# Patient Record
Sex: Female | Born: 1980 | Race: White | Hispanic: No | Marital: Married
Health system: Midwestern US, Community
[De-identification: ages and names within clinical notes are randomized; demographics above are authoritative.]

## PROBLEM LIST (undated history)

## (undated) DIAGNOSIS — L03211 Cellulitis of face: Secondary | ICD-10-CM

## (undated) DIAGNOSIS — G43709 Chronic migraine without aura, not intractable, without status migrainosus: Principal | ICD-10-CM

## (undated) DIAGNOSIS — K621 Rectal polyp: Secondary | ICD-10-CM

## (undated) DIAGNOSIS — Z8673 Personal history of transient ischemic attack (TIA), and cerebral infarction without residual deficits: Principal | ICD-10-CM

## (undated) DIAGNOSIS — I639 Cerebral infarction, unspecified: Secondary | ICD-10-CM

## (undated) DIAGNOSIS — K449 Diaphragmatic hernia without obstruction or gangrene: Secondary | ICD-10-CM

## (undated) DIAGNOSIS — R112 Nausea with vomiting, unspecified: Principal | ICD-10-CM

## (undated) DIAGNOSIS — D120 Benign neoplasm of cecum: Secondary | ICD-10-CM

## (undated) DIAGNOSIS — M542 Cervicalgia: Secondary | ICD-10-CM

## (undated) DIAGNOSIS — R1013 Epigastric pain: Principal | ICD-10-CM

## (undated) DIAGNOSIS — A4902 Methicillin resistant Staphylococcus aureus infection, unspecified site: Secondary | ICD-10-CM

## (undated) DIAGNOSIS — J45909 Unspecified asthma, uncomplicated: Secondary | ICD-10-CM

## (undated) DIAGNOSIS — F419 Anxiety disorder, unspecified: Secondary | ICD-10-CM

## (undated) DIAGNOSIS — F909 Attention-deficit hyperactivity disorder, unspecified type: Secondary | ICD-10-CM

## (undated) DIAGNOSIS — R059 Cough, unspecified: Secondary | ICD-10-CM

## (undated) DIAGNOSIS — M79604 Pain in right leg: Secondary | ICD-10-CM

## (undated) DIAGNOSIS — M6281 Muscle weakness (generalized): Secondary | ICD-10-CM

## (undated) HISTORY — PX: KNEE SURGERY: SHX244

## (undated) HISTORY — PX: APPENDECTOMY: SHX54

## (undated) HISTORY — PX: OTHER SURGICAL HISTORY: SHX169

## (undated) HISTORY — PX: CHOLECYSTECTOMY: SHX55

---

## 2009-03-03 ENCOUNTER — Emergency Department (HOSPITAL_COMMUNITY): Admission: EM | Admit: 2009-03-03 | Discharge: 2009-03-03 | Payer: Self-pay | Admitting: Emergency Medicine

## 2009-06-06 ENCOUNTER — Inpatient Hospital Stay (HOSPITAL_COMMUNITY): Admission: AD | Admit: 2009-06-06 | Discharge: 2009-06-10 | Payer: Self-pay | Admitting: Otolaryngology

## 2010-04-20 LAB — POCT I-STAT, CHEM 8
Calcium, Ion: 1.04 mmol/L — ABNORMAL LOW (ref 1.12–1.32)
Chloride: 106 mEq/L (ref 96–112)
HCT: 51 % — ABNORMAL HIGH (ref 36.0–46.0)
Hemoglobin: 17.3 g/dL — ABNORMAL HIGH (ref 12.0–15.0)
TCO2: 25 mmol/L (ref 0–100)

## 2010-04-20 LAB — COMPREHENSIVE METABOLIC PANEL
ALT: 36 U/L — ABNORMAL HIGH (ref 0–35)
Alkaline Phosphatase: 64 U/L (ref 39–117)
BUN: 8 mg/dL (ref 6–23)
CO2: 25 mEq/L (ref 19–32)
Chloride: 103 mEq/L (ref 96–112)
GFR calc Af Amer: >60 mL/min (ref >60)
GFR calc non Af Amer: >60 mL/min (ref >60)
Glucose, Bld: 90 mg/dL (ref 70–99)
Potassium: 3.1 mEq/L — ABNORMAL LOW (ref 3.5–5.1)
Sodium: 139 mEq/L (ref 135–145)
Total Bilirubin: 0.7 mg/dL (ref 0.3–1.2)

## 2010-04-20 LAB — PHOSPHORUS: Phosphorus: 3.1 mg/dL (ref 2.3–4.6)

## 2010-04-20 LAB — D-DIMER, QUANTITATIVE: D-Dimer, Quant: 0.38 ug/mL-FEU (ref 0.00–0.48)

## 2010-04-21 LAB — CULTURE, ROUTINE-ABSCESS

## 2010-04-21 LAB — COMPREHENSIVE METABOLIC PANEL
ALT: 24 U/L (ref 0–35)
AST: 22 U/L (ref 0–37)
Alkaline Phosphatase: 54 U/L (ref 39–117)
CO2: 28 mEq/L (ref 19–32)
Chloride: 105 mEq/L (ref 96–112)
Creatinine, Ser: 0.99 mg/dL (ref 0.4–1.2)
Total Bilirubin: 0.8 mg/dL (ref 0.3–1.2)

## 2013-03-02 ENCOUNTER — Emergency Department (HOSPITAL_COMMUNITY): Payer: Self-pay

## 2013-03-02 ENCOUNTER — Emergency Department (HOSPITAL_COMMUNITY)
Admission: EM | Admit: 2013-03-02 | Discharge: 2013-03-02 | Disposition: A | Discharge status: Home / Self Care | Payer: Self-pay | Attending: Emergency Medicine | Admitting: Emergency Medicine

## 2013-03-02 ENCOUNTER — Encounter (HOSPITAL_COMMUNITY): Payer: Self-pay | Admitting: Emergency Medicine

## 2013-03-02 DIAGNOSIS — R519 Headache, unspecified: Secondary | ICD-10-CM

## 2013-03-02 DIAGNOSIS — R1011 Right upper quadrant pain: Secondary | ICD-10-CM | POA: Insufficient documentation

## 2013-03-02 DIAGNOSIS — J45909 Unspecified asthma, uncomplicated: Secondary | ICD-10-CM | POA: Insufficient documentation

## 2013-03-02 DIAGNOSIS — H538 Other visual disturbances: Secondary | ICD-10-CM | POA: Insufficient documentation

## 2013-03-02 DIAGNOSIS — R404 Transient alteration of awareness: Secondary | ICD-10-CM | POA: Insufficient documentation

## 2013-03-02 DIAGNOSIS — R1013 Epigastric pain: Secondary | ICD-10-CM | POA: Insufficient documentation

## 2013-03-02 DIAGNOSIS — Z8659 Personal history of other mental and behavioral disorders: Secondary | ICD-10-CM | POA: Insufficient documentation

## 2013-03-02 DIAGNOSIS — Z8614 Personal history of Methicillin resistant Staphylococcus aureus infection: Secondary | ICD-10-CM | POA: Insufficient documentation

## 2013-03-02 DIAGNOSIS — R112 Nausea with vomiting, unspecified: Secondary | ICD-10-CM | POA: Insufficient documentation

## 2013-03-02 DIAGNOSIS — R55 Syncope and collapse: Secondary | ICD-10-CM | POA: Insufficient documentation

## 2013-03-02 DIAGNOSIS — F17200 Nicotine dependence, unspecified, uncomplicated: Secondary | ICD-10-CM | POA: Insufficient documentation

## 2013-03-02 DIAGNOSIS — R1012 Left upper quadrant pain: Secondary | ICD-10-CM | POA: Insufficient documentation

## 2013-03-02 DIAGNOSIS — Z3202 Encounter for pregnancy test, result negative: Secondary | ICD-10-CM | POA: Insufficient documentation

## 2013-03-02 DIAGNOSIS — R51 Headache: Secondary | ICD-10-CM | POA: Insufficient documentation

## 2013-03-02 HISTORY — DX: Anxiety disorder, unspecified: F41.9

## 2013-03-02 HISTORY — DX: Methicillin resistant Staphylococcus aureus infection, unspecified site: A49.02

## 2013-03-02 HISTORY — DX: Unspecified asthma, uncomplicated: J45.909

## 2013-03-02 HISTORY — DX: Attention-deficit hyperactivity disorder, unspecified type: F90.9

## 2013-03-02 LAB — CBC WITH DIFFERENTIAL/PLATELET
Basophils Absolute: 0 10*3/uL (ref 0.0–0.1)
Basophils Relative: 0 % (ref 0–1)
EOS PCT: 3 % (ref 0–5)
Eosinophils Absolute: 0.3 10*3/uL (ref 0.0–0.7)
HCT: 44.7 % (ref 36.0–46.0)
Hemoglobin: 15.9 g/dL — ABNORMAL HIGH (ref 12.0–15.0)
LYMPHS ABS: 3.8 10*3/uL (ref 0.7–4.0)
LYMPHS PCT: 36 % (ref 12–46)
MCH: 34 pg (ref 26.0–34.0)
MCHC: 35.6 g/dL (ref 30.0–36.0)
MCV: 95.7 fL (ref 78.0–100.0)
Monocytes Absolute: 0.6 10*3/uL (ref 0.1–1.0)
Monocytes Relative: 6 % (ref 3–12)
Neutro Abs: 5.7 10*3/uL (ref 1.7–7.7)
Neutrophils Relative %: 54 % (ref 43–77)
PLATELETS: 275 10*3/uL (ref 150–400)
RBC: 4.67 MIL/uL (ref 3.87–5.11)
RDW: 12 % (ref 11.5–15.5)
WBC: 10.4 10*3/uL (ref 4.0–10.5)

## 2013-03-02 LAB — COMPREHENSIVE METABOLIC PANEL
ALBUMIN: 3.9 g/dL (ref 3.5–5.2)
ALK PHOS: 56 U/L (ref 39–117)
ALT: 12 U/L (ref 0–35)
AST: 14 U/L (ref 0–37)
BILIRUBIN TOTAL: 0.2 mg/dL — AB (ref 0.3–1.2)
BUN: 8 mg/dL (ref 6–23)
CO2: 29 mEq/L (ref 19–32)
CREATININE: 0.86 mg/dL (ref 0.50–1.10)
Calcium: 9.4 mg/dL (ref 8.4–10.5)
Chloride: 102 mEq/L (ref 96–112)
GFR calc non Af Amer: 88 mL/min — ABNORMAL LOW (ref >90)
GLUCOSE: 95 mg/dL (ref 70–99)
POTASSIUM: 3.5 meq/L — AB (ref 3.7–5.3)
Sodium: 143 mEq/L (ref 137–147)
TOTAL PROTEIN: 7.6 g/dL (ref 6.0–8.3)

## 2013-03-02 LAB — LIPASE, BLOOD: Lipase: 34 U/L (ref 11–59)

## 2013-03-02 LAB — URINALYSIS, ROUTINE W REFLEX MICROSCOPIC
Bilirubin Urine: NEGATIVE
GLUCOSE, UA: NEGATIVE mg/dL
LEUKOCYTES UA: NEGATIVE
Nitrite: NEGATIVE
PH: 7 (ref 5.0–8.0)
PROTEIN: NEGATIVE mg/dL
Specific Gravity, Urine: <1.005 — ABNORMAL LOW (ref 1.005–1.030)
Urobilinogen, UA: 0.2 mg/dL (ref 0.0–1.0)

## 2013-03-02 LAB — PREGNANCY, URINE: PREG TEST UR: NEGATIVE

## 2013-03-02 LAB — URINE MICROSCOPIC-ADD ON

## 2013-03-02 LAB — OCCULT BLOOD, POC DEVICE: Fecal Occult Bld: NEGATIVE

## 2013-03-02 LAB — TROPONIN I

## 2013-03-02 MED ORDER — FAMOTIDINE IN NACL 20-0.9 MG/50ML-% IV SOLN
20.0000 mg | Freq: Once | INTRAVENOUS | Status: AC
  Administered 2013-03-02: 20 mg via INTRAVENOUS
  Filled 2013-03-02: qty 50

## 2013-03-02 MED ORDER — PANTOPRAZOLE SODIUM 40 MG IV SOLR
40.0000 mg | Freq: Once | INTRAVENOUS | Status: AC
  Administered 2013-03-02: 40 mg via INTRAVENOUS
  Filled 2013-03-02: qty 40

## 2013-03-02 MED ORDER — IOHEXOL 300 MG/ML  SOLN
100.0000 mL | Freq: Once | INTRAMUSCULAR | Status: AC | PRN
  Administered 2013-03-02: 100 mL via INTRAVENOUS

## 2013-03-02 MED ORDER — IOHEXOL 300 MG/ML  SOLN
50.0000 mL | Freq: Once | INTRAMUSCULAR | Status: AC | PRN
  Administered 2013-03-02: 50 mL via ORAL

## 2013-03-02 MED ORDER — KETOROLAC TROMETHAMINE 30 MG/ML IJ SOLN
30.0000 mg | Freq: Once | INTRAMUSCULAR | Status: AC
  Administered 2013-03-02: 30 mg via INTRAVENOUS
  Filled 2013-03-02: qty 1

## 2013-03-02 MED ORDER — METOCLOPRAMIDE HCL 5 MG/ML IJ SOLN
10.0000 mg | Freq: Once | INTRAMUSCULAR | Status: AC
  Administered 2013-03-02: 10 mg via INTRAVENOUS
  Filled 2013-03-02: qty 2

## 2013-03-02 MED ORDER — ONDANSETRON HCL 4 MG/2ML IJ SOLN: 4.0000 mg | INTRAMUSCULAR | Status: DC | PRN

## 2013-03-02 MED ORDER — DIPHENHYDRAMINE HCL 50 MG/ML IJ SOLN
50.0000 mg | Freq: Once | INTRAMUSCULAR | Status: AC
  Administered 2013-03-02: 50 mg via INTRAVENOUS
  Filled 2013-03-02: qty 1

## 2013-03-02 MED ORDER — SODIUM CHLORIDE 0.9 % IV SOLN
INTRAVENOUS | Status: DC
  Administered 2013-03-02: 17:00:00 via INTRAVENOUS

## 2013-03-02 MED ORDER — PROMETHAZINE HCL 25 MG PO TABS: 25.0000 mg | ORAL_TABLET | Freq: Four times a day (QID) | ORAL | Status: AC | PRN

## 2013-03-02 NOTE — ED Notes (Addendum)
Patient reports syncopal episodes since July. Patient states that she has these "periods where she blackouts and wakes on the floor." Patient reports severe left side headache that causes her to have blurred vision. Per patient started vomiting after eating 4 months ago and vomiting has progressively gotten worse in past 2 months. Patient reports blood in emesis.

## 2013-03-02 NOTE — Discharge Instructions (Signed)
°Emergency Department Resource Guide °1) Find a Doctor and Pay Out of Pocket °Although you won't have to find out who is covered by your insurance plan, it is a good idea to ask around and get recommendations. You will then need to call the office and see if the doctor you have chosen will accept you as a new patient and what types of options they offer for patients who are self-pay. Some doctors offer discounts or will set up payment plans for their patients who do not have insurance, but you will need to ask so you aren't surprised when you get to your appointment. ° °2) Contact Your Local Health Department °Not all health departments have doctors that can see patients for sick visits, but many do, so it is worth a call to see if yours does. If you don't know where your local health department is, you can check in your phone book. The CDC also has a tool to help you locate your state's health department, and many state websites also have listings of all of their local health departments. ° °3) Find a Walk-in Clinic °If your illness is not likely to be very severe or complicated, you may want to try a walk in clinic. These are popping up all over the country in pharmacies, drugstores, and shopping centers. They're usually staffed by nurse practitioners or physician assistants that have been trained to treat common illnesses and complaints. They're usually fairly quick and inexpensive. However, if you have serious medical issues or chronic medical problems, these are probably not your best option. ° °No Primary Care Doctor: °- Call [facility] at  832-8000 - they can help you locate a primary care doctor that  accepts your insurance, provides certain services, etc. °- Physician Referral Service- 1-800-533-3463 ° °Chronic Pain Problems: °Organization         Address  Phone   Notes  °[facility]  (336) 297-2271 Patients need to be referred by their primary care doctor.  ° °Medication  Assistance: °Organization         Address  Phone   Notes  °[place] [facility] [address] °[place], [place] [address] (336) 641-8030 --Must be a resident of [place] °-- Must have NO insurance coverage whatsoever (no Medicaid/ Medicare, etc.) °-- The pt. MUST have a primary care doctor that directs their care regularly and follows them in the community °  °[facility]  (866) 331-1348   °[employer]  (888) 892-1162   ° °Agencies that provide inexpensive medical care: °Organization         Address  Phone   Notes  °[facility]  (336) 832-8035   °[facility]    (336) 832-7272   °[facility] [address] °[place], [place] [address] (336) 832-4777   °[facility] [address], °[place] (336) 271-4999   °[facility]    (336) 373-0678   °[facility]    (336) 272-1050   °[facility] ° [address], [place] Phone:  (336) 832-4444, Fax:  (336) 832-4440 Hours of Operation:  9 am - 6 pm, M-F.  Also accepts Medicaid/Medicare and self-pay.  °[facility] ° [address], [place] Phone: (336) 832-3150, Fax: (336) 832-3151. Hours of Operation:  8:30 am - 5:30 pm, M-F.  Also accepts Medicaid and self-pay.  °[facility] [place] [address], [place] Phone: (336) 878-6027   °[facility] [address], [place], [place] (336)723-1848, Ext. 123 Mondays & Thursdays: 7-9 AM.  First 15 patients are seen on a first come, first serve basis. °  ° °Medicaid-accepting [place] Providers: ° °Organization         Address  Phone   Notes  °[facility] [address], [place] (336) 641-2100 Also accepts self-pay patients.  °[facility] [address], [place] ° (336) 856-9996   °[facility] [address], [place]  (336) 288-8857   °[facility] [address] [place] [address], [place] (336) 299-7000   °Veita Bland [address], [place]  ° (336) 373-1557 Only accepts [place] Access Medicaid patients after they have their name applied to their card.  ° °Self-Pay (no insurance) in [place]: ° °Organization         Address  Phone   Notes  °Sickle Cell Patients, [facility] [address], [place] (336) 832-1970   °[facility] [address], [place] (336) 832-4400   °[facility] ° [address], [place] (336) 992-4800   °[facility]/Dr. Osei-Bonsu ° [address] [place] [address], [place] or [address], [place] (336) 841-8500 Phone number for both [place] and [place] locations is the same.  °[facility] [address], [place] (336) 299-0000   °[facility] [address] [place] [address], [place] or [address] (336) 852-7530 °(336) 878-2260   °[facility] [address], [place] (336) 350-1642, phone; (336) 294-5005, fax Sees patients 1st and 3rd Saturday of every month.  Must not qualify for public or private insurance (i.e. Medicaid, Medicare, [place] Health Choice, Veterans' Benefits) • Household income should be no more than 200% of the poverty level •The clinic cannot treat you if you are pregnant or think you are pregnant • Sexually transmitted diseases are not treated at the clinic.  ° ° °Dental Care: °Organization         Address  Phone  Notes  °[place] [facility] [address], [place] (336) 641-6152 Accepts children up to age 21 who are enrolled in Medicaid or [place] Health Choice; pregnant women with a Medicaid card; and children who have applied for Medicaid or [place] Health Choice, but were declined, whose parents can pay a reduced fee at time of service.  °[place]  [facility] [place]  [address], [place] (336) 641-7733 Accepts children up to age 21 who are enrolled in Medicaid or [place] Health Choice; pregnant women with a Medicaid card; and children who have applied for Medicaid or [place] Health Choice, but were declined, whose parents can pay a reduced fee at time of service.  °[facility] ° [address], [place] (336) 641-4533 Patients are seen by appointment only. Walk-ins are not accepted. [facility] will see patients 18 years of age and older. °Monday - Tuesday (8am-5pm) °Most Wednesdays (8:30-5pm) °$30 per visit, cash only  °[facility] ° [address], [place] (336) 641-4533 Patients are seen by appointment only. Walk-ins are not accepted. [facility] will see patients 18 years of age and older. °One   Wednesday Evening (Monthly: Volunteer Based).  $30 per visit, cash only  °[facility]  (919) 537-3737 for adults; Children under age 4, call Graduate Pediatric Dentistry at ([phone]) [phone]. Children aged 4-14, please call ([phone]) [phone] to request a pediatric application. ° Dental services are provided in all areas of dental care including fillings, crowns and bridges, complete and partial dentures, implants, gum treatment, root canals, and extractions. Preventive care is also provided. Treatment is provided to both adults and children. °Patients are selected via a lottery and there is often a waiting list. °  °[facility] [address], °[place] ° (336) 763-8833 [email or web address] °  °[facility] [address], [place], [place] (336)723-1848, Ext. 123 Second and Fourth Thursday of each month, opens at 6:30 AM; Clinic ends at 9 AM.  Patients are seen on a first-come first-served basis, and a limited number are seen during each clinic.  ° °[facility] ° [address], [place], [place] (336) 723-7904    Eligibility Requirements °You must have lived in [place], [place], or [place] counties for at least the last three months. °  You cannot be eligible for state or federal sponsored healthcare insurance, including Veterans Administration, Medicaid, or Medicare. °  You generally cannot be eligible for healthcare insurance through your employer.  °  How to apply: °Eligibility screenings are held every Tuesday and Wednesday afternoon from 1:00 pm until 4:00 pm. You do not need an appointment for the interview!  °[facility] [address], [place], [place] 336-631-2330   °[facility]  336-342-8273   °[facility]  336-703-3100   °[facility]  336-570-6415   ° °Behavioral Health Resources in the Community: °Intensive Outpatient Programs °Organization         Address  Phone  Notes  °[facility] [address], [place], [place] 336-878-6098   °[facility] [address], [place], [place] 336-832-9800   °[facility] [address], [place], [place] ° 336-882-2125   °[facility] [address],  °[place], [place] 1-800-853-5163 or 336-641-4981   °Substance Abuse Resources °Organization         Address  Phone  Notes  °[facility]  336-882-2125   °[facility]  336-784-9470   °[facility]  336-285-9073   °[facility]  336-845-3988   °Residential & Outpatient Substance Abuse Program  1-800-659-3381   °Psychological Services °Organization         Address  Phone  Notes  °[facility]  336- 832-9600   °[facility]  336- 378-7881   °[facility] [address], [place] 1-800-853-5163 or 336-641-4981   ° °Mobile Crisis Teams °Organization         Address  Phone  Notes  °[facility], Mobile Crisis Care Unit  1-877-626-1772   °Assertive °[facility] ° [address]  [place], [place] 336-834-9664   °Sharon DeEsch [address] °[place] 336-554-5454   ° °Self-Help/Support Groups °Organization         Address  Phone             Notes  °[facility]  - variety of support groups  336- 373-1402 Call for more information  °Narcotics Anonymous (NA), [facility] [address], °[place]  2 meetings at this location  ° °  Residential Treatment Programs Organization         Address  Phone  Notes  [facility] [address],    [place]  [phone]   [facility]  [address], [place] [phone], [place], [place] [phone]   [facility] [address], [place] [phone] Admissions: 8am-3pm M-F  [facility] [address]    [place], [place] [phone]   [facility] [address], [place], [place] [phone]   [facility] [address]  [place], [place] [phone]([phone]   [facility] Intensive Outpatient [address], [place], [place] [phone]   [facility] [address]  [place], [place] [phone] or [phone]   [facility] [address] [place], [place] [phone] Accepts Medicaid  [facility] [address]  [place] [phone] Substance Abuse/Addiction Treatment   [facility] [place] [facility] Resources Organization         Address  Phone  Notes  [facility]  (364)127-3325(888) [phone]   Angie FavaJulie Brannon, PhD [address], [address] [place], [place]   [phone](336) ([phone])[phone] or [phone](336) [phone]   [facility]   [address] [place], [place] [phone](336) [phone]   [facility] [address], [place], [place] ([phone]) [phone](336) [phone] Insurance/Medicaid/sponsorship through [facility]  [facility] [address]                                    [place], [place] ([phone]) [phone] Therapy/tele-psych/case    [facility] [address]   [place], [place] [phone](336) [phone]    Dr. Lolly MustacheArfeen  [phone](336) [phone]   [facility]  [employer] [facility] 1) [address], [place] 2) [address], [place] 3)  [address], [place] [phone](336) ([phone](336) ([phone]  [phone](336) [phone]   [facility] Child Abuse Hot[facility] 404-375-6238(336) 539-534-5808 or [phone](336) [phone] (After Hours)       Take over the counter tylenol, ibuprofen (OR excedrin) and benadryl, as directed on packaging, with the prescription given to you today, as needed for headache.  Keep a headache diary. Eat a bland diet, avoiding greasy, fatty, fried foods, as well as spicy and acidic foods or beverages.  Avoid eating within the hour or 2 before going to bed or laying down.  Also avoid teas, colas, coffee, chocolate, pepermint and spearment.  Take over the counter pepcid, one tablet by mouth twice a day, for the next 2 to 3 weeks.  May also take over the counter maalox/mylanta, as directed on packaging, as needed for discomfort.  Take the prescription as directed.  Call the GI doctor and the Neurologist on Monday to schedule a follow up appointment next week.  Return to the Emergency Department immediately if worsening.

## 2013-03-02 NOTE — ED Provider Notes (Signed)
CSN: [id number]     Arrival date & time 03/02/13  1621 History   First MD Initiated Contact with Patient 03/02/13 1623     Chief Complaint  Patient presents with  . Emesis  . Loss of Consciousness   HPI Pt was seen at 1630. Per pt, c/o gradual onset and persistence of constant multiple symptoms that have been occuring for the past 4 months, worse over the past 2 months. Pt states she "vomits up everything I eat" and has been seeing streaks of blood in her emesis for the past 4 months, has been associated with upper abd "pain."  Pt states she has had a constant left sided headache for the past 4 months. Pt describes the headache as "sharp," located on the left side of her head, and is associated with blurry vision. Pt states she intermittently also "gets shaking all over then wake up on the floor" for the past 4 months. States she is awake/alert during these "shaking episodes." After she "wakes up on the floor," she is not lethargic, is not confused, has not injured herself, and has not been incont of bowel or bladder.  Pt has not taken any OTC meds for same, nor been evaluated by any medical professional for these symptoms since their onset. Denies any new symptoms. Denies any changing symptoms. Denies CP/palpitations, no SOB/cough, no diarrhea, no black or blood in stools, no neck or back pain, no eyes pain, no visual changes, no facial droop, no slurred speech, no focal motor weakness, no tingling/numbness in extremities, no ataxia, no fevers, no rash. Denies headache was sudden or maximal in onset or at any time.      Past Medical History  Diagnosis Date  . ADHD (attention deficit hyperactivity disorder)   . MRSA infection   . Anxiety   . Asthma    Past Surgical History  Procedure Laterality Date  . Cholecystectomy    . Appendectomy    . Knee surgery Right   . Other surgical history      drains placed in face for MRSA   Family History  Problem Relation Age of Onset  . HIV Brother   .  Cancer Other    History  Substance Use Topics  . Smoking status: Current Every Day Smoker -- 1.00 packs/day for 8 years    Types: Cigarettes  . Smokeless tobacco: Never Used  . Alcohol Use: Yes     Comment: occasional   OB History   Grav Para Term Preterm Abortions TAB SAB Ect Mult Living   1    1  1    0     Review of Systems ROS: Statement: All systems negative except as marked or noted in the HPI; Constitutional: Negative for fever and chills. ; ; Eyes: Negative for eye pain, redness and discharge. ; ; ENMT: Negative for ear pain, hoarseness, nasal congestion, sinus pressure and sore throat. ; ; Cardiovascular: Negative for chest pain, palpitations, diaphoresis, dyspnea and peripheral edema. ; ; Respiratory: Negative for cough, wheezing and stridor. ; ; Gastrointestinal: +N/V, abd pain. Negative for diarrhea, blood in stool, jaundice and rectal bleeding. . ; ; Genitourinary: Negative for dysuria, flank pain and hematuria. ; ; Musculoskeletal: Negative for back pain and neck pain. Negative for swelling and trauma.; ; Skin: Negative for pruritus, rash, abrasions, blisters, bruising and skin lesion.; ; Neuro: +headache. Negative for lightheadedness and neck stiffness. Negative for weakness, +altered level of consciousness, involuntary movement. Negative for altered mental status, extremity weakness,  paresthesias.   Allergies  Review of patient's allergies indicates no known allergies.  Home Medications  No current outpatient prescriptions on file. BP 142/94  Pulse 118  Temp(Src) 98.2 F (36.8 C) (Oral)  Resp 18  Ht 5\' 1"  (1.549 m)  Wt 160 lb (72.576 kg)  BMI 30.25 kg/m2  SpO2 100%  LMP 02/28/2013 Physical Exam 1635: Physical examination:  Nursing notes reviewed; Vital signs and O2 SAT reviewed;  Constitutional: Well developed, Well nourished, Well hydrated, In no acute distress; Head:  Normocephalic, atraumatic; Eyes: EOMI, PERRL, No scleral icterus; ENMT: Mouth and pharynx normal,  Mucous membranes moist; Neck: Supple, Full range of motion, No lymphadenopathy; Cardiovascular: Regular rate and rhythm, No murmur, rub, or gallop; Respiratory: Breath sounds clear & equal bilaterally, No rales, rhonchi, wheezes.  Speaking full sentences with ease, Normal respiratory effort/excursion; Chest: Nontender, Movement normal; Abdomen: Soft, +RUQ, mid-epigastric, LUQ tenderness to palp. No rebound or guarding. Nondistended, Normal bowel sounds. Rectal exam performed w/permission of pt and ED RN chaperone present.  Anal tone normal.  Non-tender, soft brown stool in rectal vault, heme neg.  No fissures, no external hemorrhoids, no palp masses.; Genitourinary: No CVA tenderness; Extremities: Pulses normal, No tenderness, No edema, No calf edema or asymmetry.; Neuro: AA&Ox3, Major CN grossly intact. No facial droop. Speech clear. Grips equal. Strength 5/5 equal bilat UE's and LE's. No gross focal motor or sensory deficits in extremities. Climbs on and off stretcher easily by herself. Gait steady.; Skin: Color normal, Warm, Dry.; Psych:  Anxious.     ED Course  Procedures   EKG Interpretation    Date/Time:  Friday March 02 2013 16:46:58 EST Ventricular Rate:  76 PR Interval:  138 QRS Duration: 80 QT Interval:  400 QTC Calculation: 450 R Axis:   67 Text Interpretation:  Normal sinus rhythm with sinus arrhythmia Normal ECG When compared with ECG of 03/03/2009 No significant change was found Confirmed by [facility]  MD, Nicholos Johns [phone]) on 03/02/2013 4:58:39 PM            MDM  MDM Reviewed: previous chart, nursing note and vitals Reviewed previous: labs and ECG Interpretation: labs, x-ray, CT scan and ECG     Results for orders placed during the hospital encounter of 03/02/13  URINALYSIS, ROUTINE W REFLEX MICROSCOPIC      Result Value Range   Color, Urine YELLOW  YELLOW   APPearance HAZY (*) CLEAR   Specific Gravity, Urine <1.005 (*) 1.005 - 1.030   pH 7.0  5.0 - 8.0   Glucose,  UA NEGATIVE  NEGATIVE mg/dL   Hgb urine dipstick SMALL (*) NEGATIVE   Bilirubin Urine NEGATIVE  NEGATIVE   Ketones, ur TRACE (*) NEGATIVE mg/dL   Protein, ur NEGATIVE  NEGATIVE mg/dL   Urobilinogen, UA 0.2  0.0 - 1.0 mg/dL   Nitrite NEGATIVE  NEGATIVE   Leukocytes, UA NEGATIVE  NEGATIVE  PREGNANCY, URINE      Result Value Range   Preg Test, Ur NEGATIVE  NEGATIVE  CBC WITH DIFFERENTIAL      Result Value Range   WBC 10.4  4.0 - 10.5 K/uL   RBC 4.67  3.87 - 5.11 MIL/uL   Hemoglobin 15.9 (*) 12.0 - 15.0 g/dL   HCT 96.0  45.4 - 09.8 %   MCV 95.7  78.0 - 100.0 fL   MCH 34.0  26.0 - 34.0 pg   MCHC 35.6  30.0 - 36.0 g/dL   RDW 11.9  14.7 - 82.9 %  Platelets 275  150 - 400 K/uL   Neutrophils Relative % 54  43 - 77 %   Neutro Abs 5.7  1.7 - 7.7 K/uL   Lymphocytes Relative 36  12 - 46 %   Lymphs Abs 3.8  0.7 - 4.0 K/uL   Monocytes Relative 6  3 - 12 %   Monocytes Absolute 0.6  0.1 - 1.0 K/uL   Eosinophils Relative 3  0 - 5 %   Eosinophils Absolute 0.3  0.0 - 0.7 K/uL   Basophils Relative 0  0 - 1 %   Basophils Absolute 0.0  0.0 - 0.1 K/uL  COMPREHENSIVE METABOLIC PANEL      Result Value Range   Sodium 143  137 - 147 mEq/L   Potassium 3.5 (*) 3.7 - 5.3 mEq/L   Chloride 102  96 - 112 mEq/L   CO2 29  19 - 32 mEq/L   Glucose, Bld 95  70 - 99 mg/dL   BUN 8  6 - 23 mg/dL   Creatinine, Ser 4.540.86  0.50 - 1.10 mg/dL   Calcium 9.4  8.4 - 09.810.5 mg/dL   Total Protein 7.6  6.0 - 8.3 g/dL   Albumin 3.9  3.5 - 5.2 g/dL   AST 14  0 - 37 U/L   ALT 12  0 - 35 U/L   Alkaline Phosphatase 56  39 - 117 U/L   Total Bilirubin 0.2 (*) 0.3 - 1.2 mg/dL   GFR calc non Af Amer 88 (*) >90 mL/min   GFR calc Af Amer >90  >90 mL/min  LIPASE, BLOOD      Result Value Range   Lipase 34  11 - 59 U/L  TROPONIN I      Result Value Range   Troponin I <0.30  <0.30 ng/mL  URINE MICROSCOPIC-ADD ON      Result Value Range   Squamous Epithelial / LPF FEW (*) RARE   WBC, UA 0-2  <3 WBC/hpf   RBC / HPF 0-2  <3  RBC/hpf  OCCULT BLOOD, POC DEVICE      Result Value Range   Fecal Occult Bld NEGATIVE  NEGATIVE   Dg Chest 2 View 03/02/2013   CLINICAL DATA:  Abnormal labs.  Syncopal episode.  EXAM: CHEST  2 VIEW  COMPARISON:  03/03/2009  FINDINGS: The heart size and mediastinal contours are within normal limits. Both lungs are clear. The visualized skeletal structures are unremarkable.  IMPRESSION: No active cardiopulmonary disease.   Electronically Signed   By: Amie Portlandavid  Ormond M.D.   On: 03/02/2013 18:25   Ct Head Wo Contrast 03/02/2013   CLINICAL DATA:  Episodes of syncope since July, blurry vision and severe left headache currently  EXAM: CT HEAD WITHOUT CONTRAST  TECHNIQUE: Contiguous axial images were obtained from the base of the skull through the vertex without intravenous contrast.  COMPARISON:  None.  FINDINGS: No mass lesion. No midline shift. No acute hemorrhage or hematoma. No extra-axial fluid collections. No evidence of acute infarction. The calvarium is intact.  IMPRESSION: Negative   Electronically Signed   By: Esperanza Heiraymond  Rubner M.D.   On: 03/02/2013 18:15   Ct Abdomen Pelvis W Contrast 03/02/2013   CLINICAL DATA:  Postprandial vomiting began 4 months ago progressively worsening over past 2 months, hematemesis, history appendectomy, cholecystectomy, asthma  EXAM: CT ABDOMEN AND PELVIS WITH CONTRAST  TECHNIQUE: Multidetector CT imaging of the abdomen and pelvis was performed using the standard protocol following bolus administration of intravenous contrast.  Sagittal and coronal MPR images reconstructed from axial data set.  CONTRAST:  50mL OMNIPAQUE IOHEXOL 300 MG/ML SOLN orally, OMNIPAQUE IOHEXOL 300 MG/ML SOLN IV  COMPARISON:  None  FINDINGS: Minimal subsegmental atelectasis versus scarring left lower lobe.  Patient indicates history of cholecystectomy but a thin tubular structure is identified at the gallbladder fossa, question minimally dilated cystic duct remnant versus less likely a contracted  gallbladder.  Liver, spleen, pancreas, kidneys, and adrenal glands normal appearance.  Large and small bowel loops unremarkable.  Unremarkable bladder, uterus and adnexae.  Stomach appears mildly distended by contrast with pyloric wall upper normal thickness 6 mm diameter.  No mass, adenopathy, free fluid or inflammatory process.  No hernia or acute bone lesion.  IMPRESSION: No definite acute intra-abdominal or intrapelvic abnormalities.  Slight distention of the stomach with subjectively upper normal pyloric thickness.  Tubular structure at gallbladder fossa, question minimally distended cystic duct stump, less likely contracted gallbladder in this patient who indicates a past history of cholecystectomy.   Electronically Signed   By: Ulyses Southward M.D.   On: 03/02/2013 18:25    Medications  0.9 %  sodium chloride infusion ( Intravenous New Bag/Given 03/02/13 1705)  famotidine (PEPCID) IVPB 20 mg (0 mg Intravenous Stopped 03/02/13 1736)  pantoprazole (PROTONIX) injection 40 mg (40 mg Intravenous Given 03/02/13 1704)  ketorolac (TORADOL) 30 MG/ML injection 30 mg (30 mg Intravenous Given 03/02/13 1704)  metoCLOPramide (REGLAN) injection 10 mg (10 mg Intravenous Given 03/02/13 1704)  diphenhydrAMINE (BENADRYL) injection 50 mg (50 mg Intravenous Given 03/02/13 1704)     1915:  Pt has tol PO well while in the ED without N/V.  No stooling while in the ED.  Abd benign, VSS. Feels better after meds and wants to go home now. Not orthostatic on VS. Workup reassuring. Will continue to tx symptomatically at this time; f/u GI MD and Neuro MD. Dx and testing d/w pt and family.  Questions answered.  Verb understanding, agreeable to d/c home with outpt f/u.    Laray Anger, DO 03/05/13 Nicholos Johns

## 2013-03-02 NOTE — ED Notes (Signed)
Pt alert & oriented x4, stable gait. Patient given discharge instructions, paperwork & prescription(s). Patient  instructed to stop at the registration desk to finish any additional paperwork. Patient verbalized understanding. Pt left department w/ no further questions. 

## 2013-12-03 ENCOUNTER — Encounter (HOSPITAL_COMMUNITY): Payer: Self-pay | Admitting: Emergency Medicine

## 2015-04-24 IMAGING — CT CT ABD-PELV W/ CM
2 of 4 series · 16 of 46 positions shown, 18 images · IV contrast (Omnipaque 300)
Comparison: None

CLINICAL DATA: Postprandial vomiting began 4 months ago
progressively worsening over past 2 months, hematemesis, history
appendectomy, cholecystectomy, asthma

EXAM:
CT ABDOMEN AND PELVIS WITH CONTRAST
TECHNIQUE: Multidetector CT imaging of the abdomen and pelvis was performed
using the standard protocol following bolus administration of
intravenous contrast. Sagittal and coronal MPR images reconstructed
from axial data set.
CONTRAST:  50mL OMNIPAQUE IOHEXOL 300 MG/ML SOLN orally, 100mL
OMNIPAQUE IOHEXOL 300 MG/ML SOLN IV

[Series 2: abd_pel_with 5.0 b40f · axial · 0.67mm/px · z∈[-409,-29]mm · 13 of 84 slices shown, 15 images]
[im 4/84  soft-tissue]
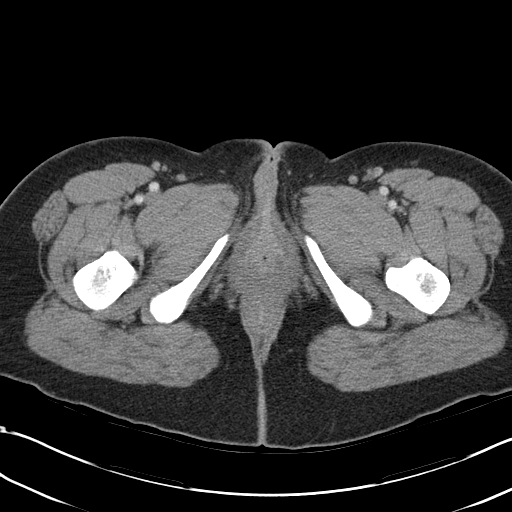
[im 4/84  bone]
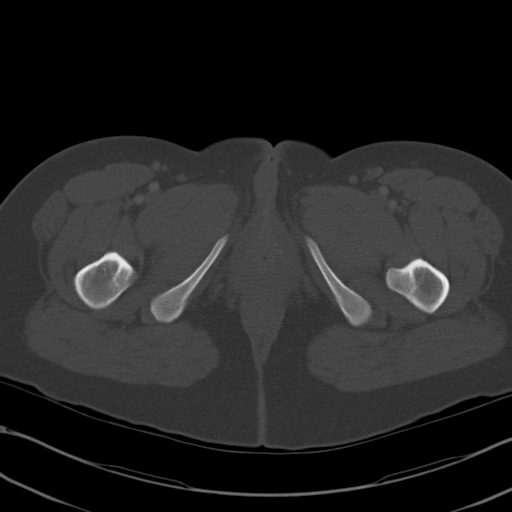
[im 11/84  soft-tissue]
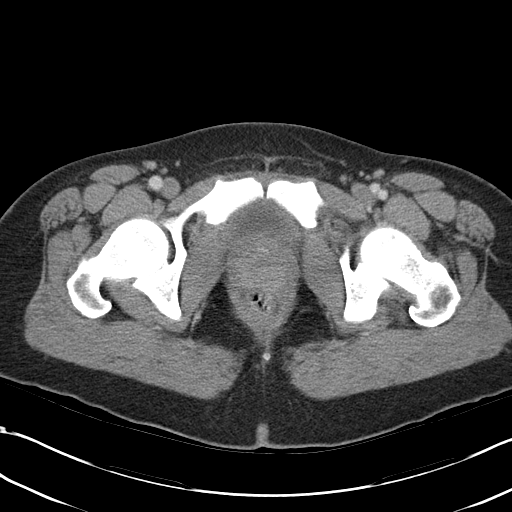
[im 18/84  soft-tissue]
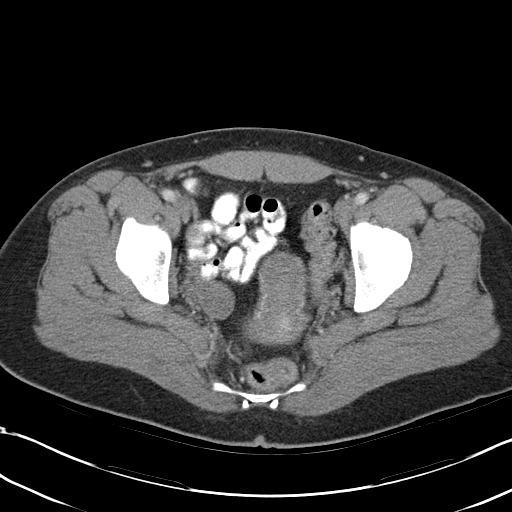
[im 25/84  soft-tissue]
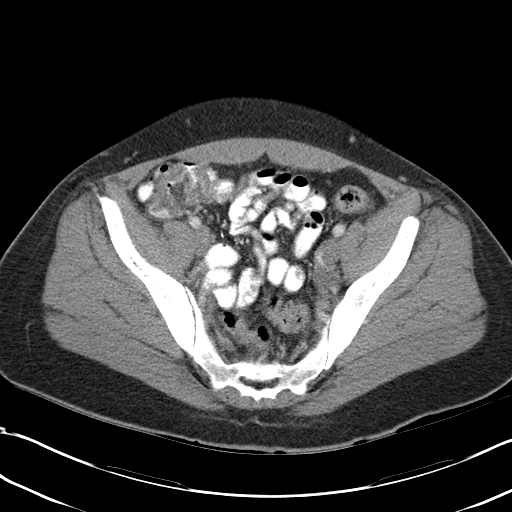
[im 28/84  soft-tissue]
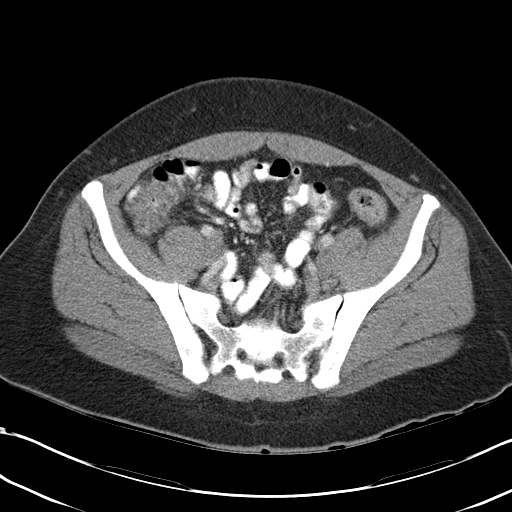
[im 35/84  soft-tissue]
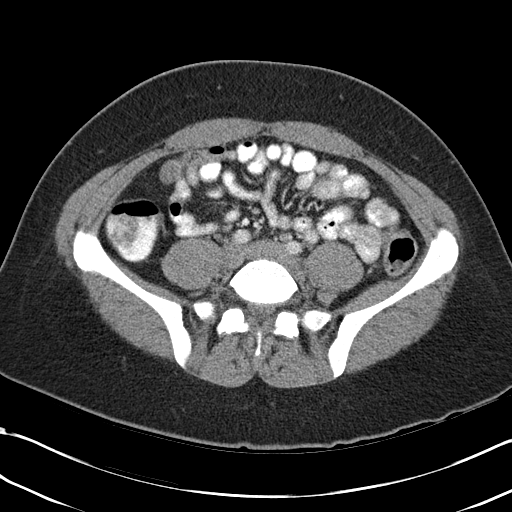
[im 42/84  soft-tissue]
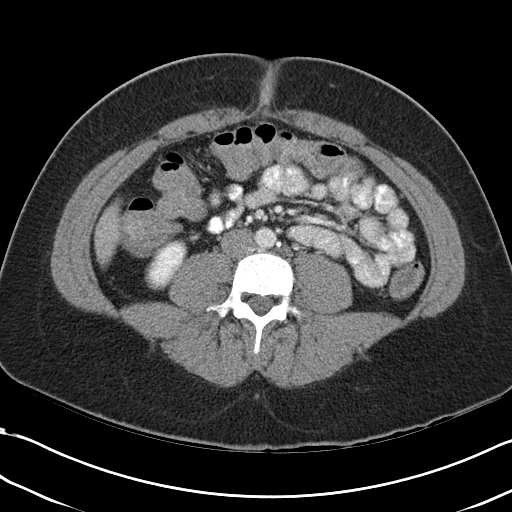
[im 49/84  soft-tissue]
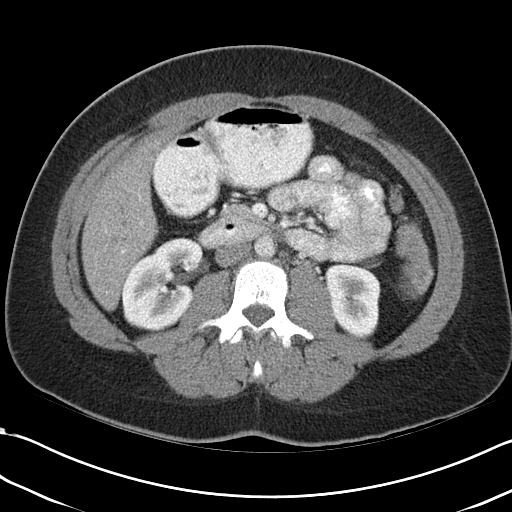
[im 56/84  soft-tissue]
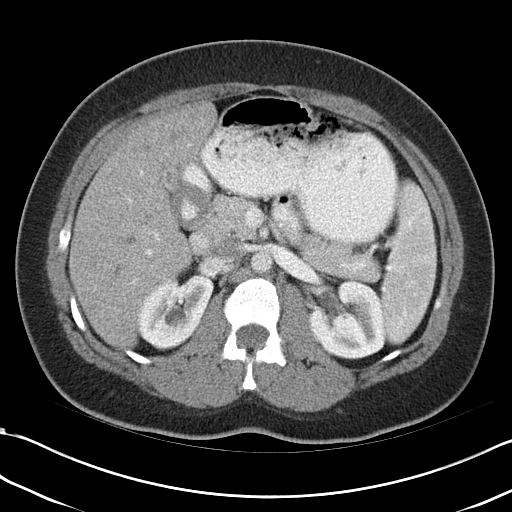
[im 56/84  bone]
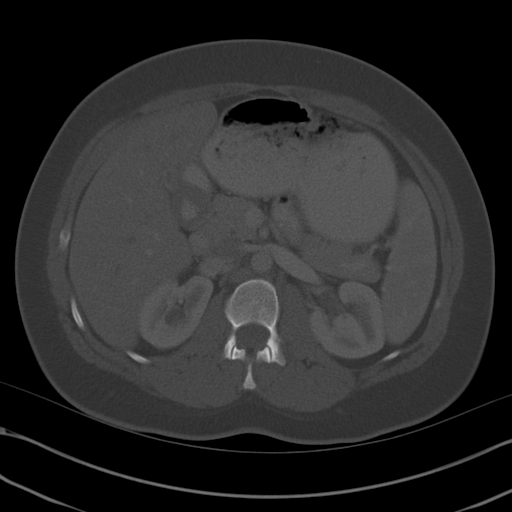
[im 59/84  soft-tissue]
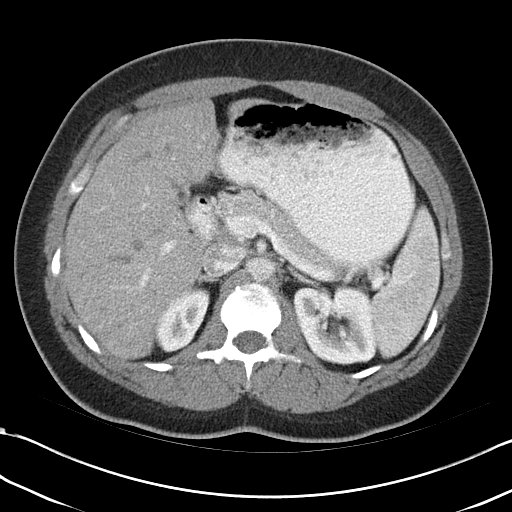
[im 66/84  soft-tissue]
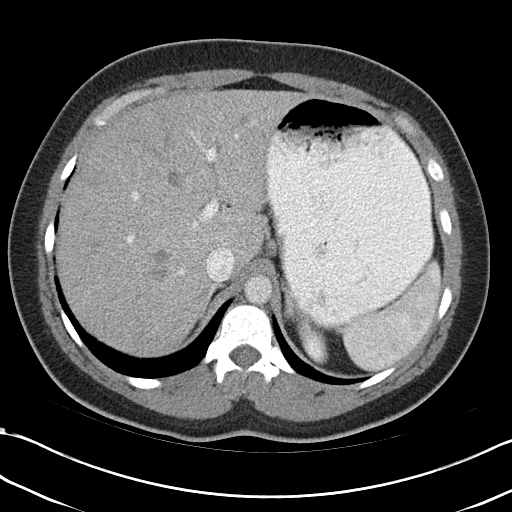
[im 73/84  soft-tissue]
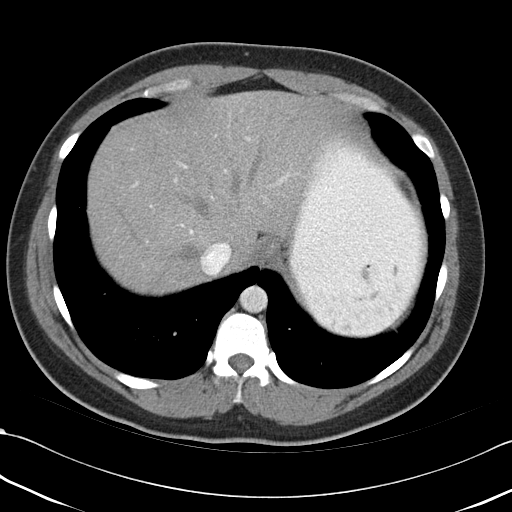
[im 80/84  soft-tissue]
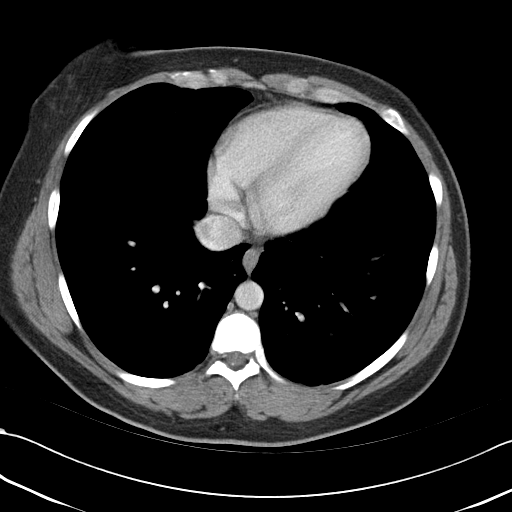

[Series 4: abd_pel_with 3.0 spo · coronal · 0.65mm/px · 3 of 80 slices shown]
[im 27/80  soft-tissue]
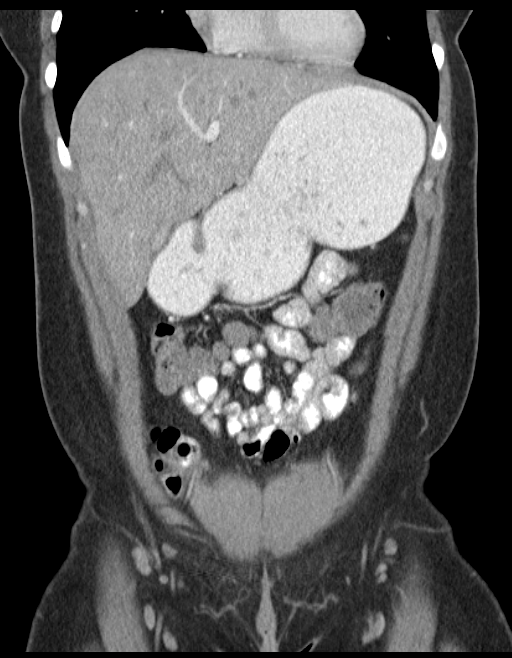
[im 36/80  soft-tissue]
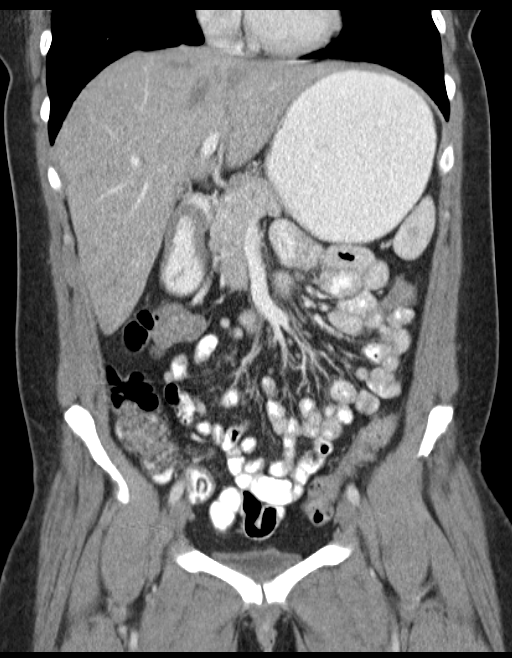
[im 44/80  soft-tissue]
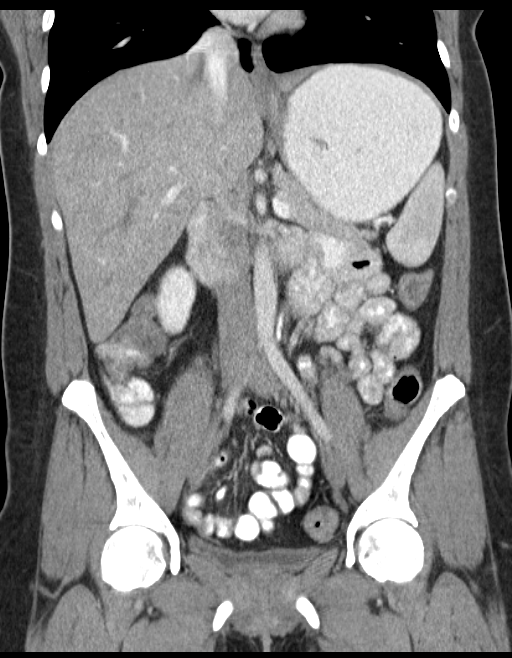

[16 of 46 positions shown; findings below may reference images not displayed]

FINDINGS: Minimal subsegmental atelectasis versus scarring left lower lobe.

Patient indicates history of cholecystectomy but a thin tubular
structure is identified at the gallbladder fossa, question minimally
dilated cystic duct remnant versus less likely a contracted
gallbladder.

Liver, spleen, pancreas, kidneys, and adrenal glands normal
appearance.

Large and small bowel loops unremarkable.

Unremarkable bladder, uterus and adnexae.

Stomach appears mildly distended by contrast with pyloric wall upper
normal thickness 6 mm diameter.

No mass, adenopathy, free fluid or inflammatory process.

No hernia or acute bone lesion.
IMPRESSION: No definite acute intra-abdominal or intrapelvic abnormalities.

Slight distention of the stomach with subjectively upper normal
pyloric thickness.

Tubular structure at gallbladder fossa, question minimally distended
cystic duct stump, less likely contracted gallbladder in this
patient who indicates a past history of cholecystectomy.

## 2017-02-09 ENCOUNTER — Inpatient Hospital Stay

## 2017-02-09 ENCOUNTER — Emergency Department: Admit: 2017-02-10 | Payer: BLUE CROSS/BLUE SHIELD | Primary: Primary Care

## 2017-02-09 ENCOUNTER — Emergency Department: Admit: 2017-02-09 | Payer: BLUE CROSS/BLUE SHIELD | Primary: Primary Care

## 2017-02-09 ENCOUNTER — Inpatient Hospital Stay
Admit: 2017-02-09 | Discharge: 2017-02-20 | Disposition: A | Discharge status: Other Acute Inpatient Hospital | Payer: BLUE CROSS/BLUE SHIELD | Attending: Internal Medicine | Admitting: Internal Medicine

## 2017-02-09 DIAGNOSIS — L0211 Cutaneous abscess of neck: Principal | ICD-10-CM

## 2017-02-09 LAB — CBC WITH AUTOMATED DIFF
ABS. BASOPHILS: 0.1 10*3/uL (ref 0.0–0.1)
ABS. EOSINOPHILS: 0.4 10*3/uL (ref 0.0–0.4)
ABS. IMM. GRANS.: 0.1 10*3/uL — ABNORMAL HIGH (ref 0.00–0.04)
ABS. LYMPHOCYTES: 3.9 10*3/uL — ABNORMAL HIGH (ref 0.8–3.5)
ABS. MONOCYTES: 0.8 10*3/uL (ref 0.0–1.0)
ABS. NEUTROPHILS: 6.8 10*3/uL (ref 1.8–8.0)
ABSOLUTE NRBC: 0 10*3/uL (ref 0.00–0.01)
BASOPHILS: 0 % (ref 0–1)
EOSINOPHILS: 4 % (ref 0–7)
HCT: 42.5 % (ref 35.0–47.0)
HGB: 14.4 g/dL (ref 11.5–16.0)
IMMATURE GRANULOCYTES: 1 % — ABNORMAL HIGH (ref 0.0–0.5)
LYMPHOCYTES: 33 % (ref 12–49)
MCH: 33.3 PG (ref 26.0–34.0)
MCHC: 33.9 g/dL (ref 30.0–36.5)
MCV: 98.4 FL (ref 80.0–99.0)
MONOCYTES: 7 % (ref 5–13)
MPV: 10.4 FL (ref 8.9–12.9)
NEUTROPHILS: 56 % (ref 32–75)
NRBC: 0 PER 100 WBC
PLATELET: 281 10*3/uL (ref 150–400)
RBC: 4.32 M/uL (ref 3.80–5.20)
RDW: 11.9 % (ref 11.5–14.5)
WBC: 12.1 10*3/uL — ABNORMAL HIGH (ref 3.6–11.0)

## 2017-02-09 LAB — METABOLIC PANEL, COMPREHENSIVE
A-G Ratio: 0.8 — ABNORMAL LOW (ref 1.1–2.2)
ALT (SGPT): 20 U/L (ref 12–78)
AST (SGOT): 36 U/L (ref 15–37)
Albumin: 3.5 g/dL (ref 3.5–5.0)
Alk. phosphatase: 66 U/L (ref 45–117)
Anion gap: 7 mmol/L (ref 5–15)
BUN/Creatinine ratio: 13 (ref 12–20)
BUN: 12 MG/DL (ref 6–20)
Bilirubin, total: 0.5 MG/DL (ref 0.2–1.0)
CO2: 28 mmol/L (ref 21–32)
Calcium: 8.8 MG/DL (ref 8.5–10.1)
Chloride: 104 mmol/L (ref 97–108)
Creatinine: 0.91 MG/DL (ref 0.55–1.02)
GFR est AA: >60 mL/min/{1.73_m2} (ref >60)
GFR est non-AA: >60 mL/min/{1.73_m2} (ref >60)
Globulin: 4.2 g/dL — ABNORMAL HIGH (ref 2.0–4.0)
Glucose: 62 mg/dL — ABNORMAL LOW (ref 65–100)
Potassium: 5 mmol/L (ref 3.5–5.1)
Protein, total: 7.7 g/dL (ref 6.4–8.2)
Sodium: 139 mmol/L (ref 136–145)

## 2017-02-09 LAB — SAMPLES BEING HELD

## 2017-02-09 LAB — LACTIC ACID: Lactic acid: 1.8 MMOL/L (ref 0.4–2.0)

## 2017-02-09 MED ORDER — MORPHINE 2 MG/ML INJECTION
2 mg/mL | INTRAMUSCULAR | Status: AC
Start: 2017-02-09 — End: 2017-02-09
  Administered 2017-02-09: 23:00:00 via INTRAVENOUS

## 2017-02-09 MED ORDER — SODIUM CHLORIDE 0.9% BOLUS IV
0.9 % | Freq: Once | INTRAVENOUS | Status: AC
Start: 2017-02-09 — End: 2017-02-09
  Administered 2017-02-09: 22:00:00 via INTRAVENOUS

## 2017-02-09 MED ORDER — SODIUM CHLORIDE 0.9 % IJ SYRG
Freq: Once | INTRAMUSCULAR | Status: AC
Start: 2017-02-09 — End: 2017-02-09
  Administered 2017-02-09: 22:00:00 via INTRAVENOUS

## 2017-02-09 MED ORDER — IOPAMIDOL 76 % IV SOLN
370 mg iodine /mL (76 %) | Freq: Once | INTRAVENOUS | Status: AC
Start: 2017-02-09 — End: 2017-02-09
  Administered 2017-02-09: 22:00:00 via INTRAVENOUS

## 2017-02-09 MED FILL — MORPHINE 2 MG/ML INJECTION: 2 mg/mL | INTRAMUSCULAR | Qty: 1

## 2017-02-09 MED FILL — ISOVUE-370  76 % INTRAVENOUS SOLUTION: 370 mg iodine /mL (76 %) | INTRAVENOUS | Qty: 100

## 2017-02-09 MED FILL — SODIUM CHLORIDE 0.9 % IV: INTRAVENOUS | Qty: 100

## 2017-02-09 MED FILL — NORMAL SALINE FLUSH 0.9 % INJECTION SYRINGE: INTRAMUSCULAR | Qty: 10

## 2017-02-09 NOTE — Other (Signed)
TRANSFER - OUT REPORT:    Verbal report given to Liu(name) on Christina Stevens  being transferred to 5S(unit) for routine progression of care       Report consisted of patient???s Situation, Background, Assessment and   Recommendations(SBAR).     Information from the following report(s) ED Summary and Recent Results was reviewed with the receiving nurse.    Lines:   Peripheral IV 02/09/17 Right Arm (Active)   Site Assessment Clean, dry, & intact 02/09/2017  3:50 PM   Phlebitis Assessment 0 02/09/2017  3:50 PM   Infiltration Assessment 0 02/09/2017  3:50 PM   Dressing Status Clean, dry, & intact 02/09/2017  3:50 PM   Dressing Type Transparent 02/09/2017  3:50 PM   Hub Color/Line Status Pink;Capped;Flushed;Patent 02/09/2017  3:50 PM   Action Taken Blood drawn 02/09/2017  3:50 PM        Opportunity for questions and clarification was provided.      Patient transported with:   [employer]

## 2017-02-09 NOTE — ED Triage Notes (Signed)
Patient states she was seen by her doctor 2 weeks ago for intermittent fevers and was diagnosed with mono and given prednisone and amoxicillin. Patient states 2 days ago she started with left sided swelling of her arms and legs, feeling like her skin was tight. Today patient states she has an abscess under her shin and she has been having increased swelling which is causing her to have difficulty breathing and swallowing. Patient states she cut herself shaving several days ago and was treated with 10 days of antibiotics. Patient is also c/o left sided abdominal pain, pain on palpation.

## 2017-02-09 NOTE — ED Notes (Signed)
PROGRESS NOTE:  7:26 PM  Christina Stevens, Benjie Karvonenharles D, MD reviewed CT w/ Dr. Belva ChimesSzucs. There is no sign of abscess, deep neck infection, or Ludwig angina. Pt has cellulitis involving bilat face w/ some submental swelling, no definite abscess by CT. Recent mono w/ continuing fever after amoxicillin. Will admit to hospitalist and give IV Ancef.     CONSULT NOTE:  7:52 PM Fanny Agan, Benjie Karvonenharles D, MD Tiger Texted Dr. Christie BeckersMathur, Consult for Hospitalist.  Discussed available diagnostic tests and clinical findings.  Dr. Christie BeckersMathur will evaluate Pt for admission.

## 2017-02-09 NOTE — ED Notes (Signed)
Pt to CT.

## 2017-02-09 NOTE — Progress Notes (Signed)
Admission Medication Reconciliation:    Information obtained from: patient    Significant PMH/Disease States:   Past Medical History:   Diagnosis Date   ??? ADHD    ??? PCOS (polycystic ovarian syndrome)        Chief Complaint for this Admission:  sore throat    Allergies:  Patient has no known allergies.    Prior to Admission Medications:   Prior to Admission Medications   Prescriptions Last Dose Informant Patient Reported? Taking?   dextroamphetamine-amphetamine (ADDERALL) 20 mg tablet 02/08/2017 at Unknown time  Yes Yes   Sig: Take 40 mg by mouth daily.      Facility-Administered Medications: None     Comments/Recommendations: medication review done with patient  Added Adderal.  Last dose 02/08/17  No known medication allergies

## 2017-02-09 NOTE — Progress Notes (Signed)
TRANSFER - IN REPORT:    Verbal report received from jillian RN(name) on Christina Stevens  being received from ED(unit) for routine progression of care      Report consisted of patient???s Situation, Background, Assessment and   Recommendations(SBAR).     Information from the following report(s) ED Summary was reviewed with the receiving nurse.    Opportunity for questions and clarification was provided.      Assessment completed upon patient???s arrival to unit and care assumed.

## 2017-02-09 NOTE — ED Notes (Signed)
Pt aware of need for urine sample.

## 2017-02-09 NOTE — ED Provider Notes (Signed)
PIT Note: Christina Stevens is a 37 y.o. female who presents ambulatory to the ED with  Multiple complaints. Patient states she was seen by her doctor 2 weeks ago for intermittent fevers and was diagnosed with mono and given prednisone and amoxicillin. Patient states 2 days ago she started with left sided swelling of her arms and legs, feeling like her skin was tight. Today patient states she has an abscess under her shin and she has been having increased swelling which is causing her to have difficulty breathing and swallowing. Patient states she cut herself shaving several days ago and was treated with 10 days of antibiotics (Amoxicillin) Patient is also c/o left sided abdominal pain, pain on palpation. Patient was seen at [facility] as well as [facility] in the past month. Presents here with worsening symptoms.     PCP: Ancil Boozer, NP    PMHx significant for: No past medical history on file.    PSHx significant for: No past surgical history on file.    There are no further complaints or symptoms at this time.   [name], Magnus Sinning, NP, 3:08 PM        37 y.o. female with past medical history significant for PCOS and ADHD who presents from home with chief complaint of SOB. Pt reports she "hasn't felt like (her)self" for 1 month d/t fatigue and intermittent fever. She states she was evaluated at an ED 3-4 weeks ago d/t fever and was dx w/ a "viral illness" at that time. Pt was evaluated by her PCP 2.5 weeks ago where she tested positive for mono; she was placed on a 10-day course of amoxicillin "as a precaution" at that time. Pt states she cut her chin ~1 week ago while shaving, and the wound had remained unchanged until she finished the amoxicillin 4-5 days ago. Since then, her chin has been progressively swelling, she c/o sore throat w/ neck stiffness and SOB, she is unable to fully open her mouth, and her L sided face is swollen and red. Pt states she "broke out with small white pockets" over her chin this  morning, and she c/o a new painful "protrusion" over her L lateral ribs. There are no other acute medical concerns at this time.    PCP: Ancil Boozer, NP    Note written by Janece Canterbury, Scribe, as dictated by Christella Noa, MD 5:07 PM             No past medical history on file.    No past surgical history on file.      No family history on file.    Social History     Socioeconomic History   ??? Marital status: MARRIED     Spouse name: Not on file   ??? Number of children: Not on file   ??? Years of education: Not on file   ??? Highest education level: Not on file   Social Needs   ??? Financial resource strain: Not on file   ??? Food insecurity - worry: Not on file   ??? Food insecurity - inability: Not on file   ??? Transportation needs - medical: Not on file   ??? Transportation needs - non-medical: Not on file   Occupational History   ??? Not on file   Tobacco Use   ??? Smoking status: Not on file   Substance and Sexual Activity   ??? Alcohol use: Not on file   ??? Drug use: Not on file   ???  Sexual activity: Not on file   Other Topics Concern   ??? Not on file   Social History Narrative   ??? Not on file         ALLERGIES: Patient has no allergy information on record.    Review of Systems   Constitutional: Positive for fatigue and fever. Negative for appetite change, chills and diaphoresis.   HENT: Positive for facial swelling and sore throat. Negative for congestion, ear discharge, ear pain, sinus pressure, sinus pain and trouble swallowing.    Eyes: Negative for photophobia, pain, redness and visual disturbance.   Respiratory: Positive for shortness of breath. Negative for chest tightness and wheezing.    Cardiovascular: Negative for chest pain and palpitations.   Gastrointestinal: Negative for abdominal distention, abdominal pain, nausea and vomiting.   Endocrine: Negative.    Genitourinary: Negative for difficulty urinating, flank pain, frequency and urgency.    Musculoskeletal: Positive for myalgias and neck stiffness. Negative for back pain and neck pain.   Skin: Positive for color change and wound. Negative for pallor and rash.   Allergic/Immunologic: Negative.    Neurological: Negative for dizziness, speech difficulty, weakness and headaches.   Hematological: Does not bruise/bleed easily.   Psychiatric/Behavioral: Negative for behavioral problems. The patient is not nervous/anxious.    All other systems reviewed and are negative.      Vitals:    02/09/17 1433   Pulse: 99   SpO2: 99%            Physical Exam   Constitutional: She is oriented to person, place, and time. She appears well-developed and well-nourished. No distress.   HENT:   Head: Normocephalic and atraumatic.   Eyes: Conjunctivae are normal. No scleral icterus.   Neck: Neck supple. No tracheal deviation present.   Cardiovascular: Normal rate, regular rhythm, normal heart sounds and intact distal pulses. Exam reveals no gallop and no friction rub.   No murmur heard.  Pulmonary/Chest: Effort normal and breath sounds normal. She has no wheezes. She has no rales.   Abdominal: Soft. She exhibits no distension. There is no tenderness. There is no rebound and no guarding.   Musculoskeletal: She exhibits tenderness. She exhibits no edema.   Slight tenderness over L lateral ribs.    Neurological: She is alert and oriented to person, place, and time.   Skin: Skin is warm and dry. No rash noted. There is erythema (over neck).   Psychiatric: She has a normal mood and affect.   Nursing note and vitals reviewed.  Note written by Janece CanterburyJay F Spangler, Scribe, as dictated by Christella NoaMagnant, Charles D, MD 5:07 PM    MDM         Procedures

## 2017-02-09 NOTE — ED Notes (Signed)
Pt declines pregnancy test.

## 2017-02-10 LAB — CBC WITH AUTOMATED DIFF
ABS. BASOPHILS: 0.1 10*3/uL (ref 0.0–0.1)
ABS. EOSINOPHILS: 0.5 10*3/uL — ABNORMAL HIGH (ref 0.0–0.4)
ABS. IMM. GRANS.: 0 10*3/uL (ref 0.00–0.04)
ABS. LYMPHOCYTES: 5.6 10*3/uL — ABNORMAL HIGH (ref 0.8–3.5)
ABS. MONOCYTES: 1.1 10*3/uL — ABNORMAL HIGH (ref 0.0–1.0)
ABS. NEUTROPHILS: 8.1 10*3/uL — ABNORMAL HIGH (ref 1.8–8.0)
ABSOLUTE NRBC: 0 10*3/uL (ref 0.00–0.01)
BASOPHILS: 0 % (ref 0–1)
EOSINOPHILS: 3 % (ref 0–7)
HCT: 38.3 % (ref 35.0–47.0)
HGB: 12.9 g/dL (ref 11.5–16.0)
IMMATURE GRANULOCYTES: 0 % (ref 0.0–0.5)
LYMPHOCYTES: 37 % (ref 12–49)
MCH: 33.3 PG (ref 26.0–34.0)
MCHC: 33.7 g/dL (ref 30.0–36.5)
MCV: 99 FL (ref 80.0–99.0)
MONOCYTES: 7 % (ref 5–13)
MPV: 10.2 FL (ref 8.9–12.9)
NEUTROPHILS: 53 % (ref 32–75)
NRBC: 0 PER 100 WBC
PLATELET: 261 10*3/uL (ref 150–400)
RBC: 3.87 M/uL (ref 3.80–5.20)
RDW: 11.9 % (ref 11.5–14.5)
WBC: 15.4 10*3/uL — ABNORMAL HIGH (ref 3.6–11.0)

## 2017-02-10 LAB — METABOLIC PANEL, COMPREHENSIVE
A-G Ratio: 0.9 — ABNORMAL LOW (ref 1.1–2.2)
ALT (SGPT): 16 U/L (ref 12–78)
AST (SGOT): 12 U/L — ABNORMAL LOW (ref 15–37)
Albumin: 3 g/dL — ABNORMAL LOW (ref 3.5–5.0)
Alk. phosphatase: 55 U/L (ref 45–117)
Anion gap: 7 mmol/L (ref 5–15)
BUN/Creatinine ratio: 11 — ABNORMAL LOW (ref 12–20)
BUN: 10 MG/DL (ref 6–20)
Bilirubin, total: 0.3 MG/DL (ref 0.2–1.0)
CO2: 27 mmol/L (ref 21–32)
Calcium: 8.1 MG/DL — ABNORMAL LOW (ref 8.5–10.1)
Chloride: 104 mmol/L (ref 97–108)
Creatinine: 0.89 MG/DL (ref 0.55–1.02)
GFR est AA: >60 mL/min/{1.73_m2} (ref >60)
GFR est non-AA: >60 mL/min/{1.73_m2} (ref >60)
Globulin: 3.4 g/dL (ref 2.0–4.0)
Glucose: 101 mg/dL — ABNORMAL HIGH (ref 65–100)
Potassium: 3.9 mmol/L (ref 3.5–5.1)
Protein, total: 6.4 g/dL (ref 6.4–8.2)
Sodium: 138 mmol/L (ref 136–145)

## 2017-02-10 LAB — URINALYSIS W/MICROSCOPIC
Bacteria: NEGATIVE /hpf
Bilirubin: NEGATIVE
Blood: NEGATIVE
Glucose: NEGATIVE mg/dL
Ketone: NEGATIVE mg/dL
Leukocyte Esterase: NEGATIVE
Nitrites: NEGATIVE
Protein: NEGATIVE mg/dL
Specific gravity: 1.01 (ref 1.003–1.030)
Urobilinogen: 0.2 EU/dL (ref 0.2–1.0)
pH (UA): 6 (ref 5.0–8.0)

## 2017-02-10 LAB — URINE CULTURE HOLD SAMPLE

## 2017-02-10 MED ORDER — MORPHINE 2 MG/ML INJECTION
2 mg/mL | Freq: Once | INTRAMUSCULAR | Status: AC
Start: 2017-02-10 — End: 2017-02-09
  Administered 2017-02-10: 01:00:00 via INTRAVENOUS

## 2017-02-10 MED ORDER — ADV ADDAPTOR
3.375 gram | Freq: Three times a day (TID) | Status: DC
Start: 2017-02-10 — End: 2017-02-20
  Administered 2017-02-10 – 2017-02-20 (×32): via INTRAVENOUS

## 2017-02-10 MED ORDER — SODIUM CHLORIDE 0.9 % IJ SYRG
Freq: Three times a day (TID) | INTRAMUSCULAR | Status: DC
Start: 2017-02-10 — End: 2017-02-20
  Administered 2017-02-10 – 2017-02-20 (×32): via INTRAVENOUS

## 2017-02-10 MED ORDER — SODIUM CHLORIDE 0.9 % IV PIGGY BACK
3.375 gram | Freq: Once | INTRAVENOUS | Status: DC
Start: 2017-02-10 — End: 2017-02-09

## 2017-02-10 MED ORDER — KETOROLAC TROMETHAMINE 30 MG/ML INJECTION
30 mg/mL (1 mL) | Freq: Three times a day (TID) | INTRAMUSCULAR | Status: AC | PRN
Start: 2017-02-10 — End: 2017-02-11
  Administered 2017-02-10 – 2017-02-11 (×3): via INTRAVENOUS

## 2017-02-10 MED ORDER — DEXAMETHASONE SODIUM PHOSPHATE 4 MG/ML IJ SOLN
4 mg/mL | Freq: Three times a day (TID) | INTRAMUSCULAR | Status: DC
Start: 2017-02-10 — End: 2017-02-10

## 2017-02-10 MED ORDER — PHARMACY VANCOMYCIN NOTE
Freq: Once | Status: AC
Start: 2017-02-10 — End: 2017-02-11
  Administered 2017-02-11: 17:00:00

## 2017-02-10 MED ORDER — SODIUM CHLORIDE 0.9 % IV
INTRAVENOUS | Status: DC
Start: 2017-02-10 — End: 2017-02-15
  Administered 2017-02-10 – 2017-02-14 (×4): via INTRAVENOUS

## 2017-02-10 MED ORDER — HYDROMORPHONE 2 MG/ML INJECTION SOLUTION
2 mg/mL | INTRAMUSCULAR | Status: DC | PRN
Start: 2017-02-10 — End: 2017-02-10
  Administered 2017-02-10 (×2): via INTRAVENOUS

## 2017-02-10 MED ORDER — ACETAMINOPHEN (TYLENOL) SOLUTION 32MG/ML
Freq: Four times a day (QID) | ORAL | Status: DC
Start: 2017-02-10 — End: 2017-02-15
  Administered 2017-02-10 – 2017-02-15 (×18): via ORAL

## 2017-02-10 MED ORDER — IBUPROFEN 200 MG TAB
200 mg | Freq: Four times a day (QID) | ORAL | Status: DC | PRN
Start: 2017-02-10 — End: 2017-02-10

## 2017-02-10 MED ORDER — SODIUM CHLORIDE 0.9 % IV PIGGY BACK
1 gram | INTRAVENOUS | Status: AC
Start: 2017-02-10 — End: 2017-02-09
  Administered 2017-02-10: 01:00:00 via INTRAVENOUS

## 2017-02-10 MED ORDER — VANCOMYCIN IN 0.9% SODIUM CHLORIDE 1.5 G/500 ML IV
1.5 g/500 mL | Freq: Once | INTRAVENOUS | Status: AC
Start: 2017-02-10 — End: 2017-02-10
  Administered 2017-02-10: 05:00:00 via INTRAVENOUS

## 2017-02-10 MED ORDER — PHARMACY VANCOMYCIN NOTE
Status: DC
Start: 2017-02-10 — End: 2017-02-20

## 2017-02-10 MED ORDER — TRAMADOL 50 MG TAB
50 mg | Freq: Two times a day (BID) | ORAL | Status: DC | PRN
Start: 2017-02-10 — End: 2017-02-11
  Administered 2017-02-10 – 2017-02-11 (×2): via ORAL

## 2017-02-10 MED ORDER — CEFTRIAXONE 1 GRAM SOLUTION FOR INJECTION
1 gram | INTRAMUSCULAR | Status: DC
Start: 2017-02-10 — End: 2017-02-09
  Administered 2017-02-10: 01:00:00 via INTRAVENOUS

## 2017-02-10 MED ORDER — VIAL2BAG ADAPTOR (20 MM)
1000 mg | Freq: Two times a day (BID) | Status: DC
Start: 2017-02-10 — End: 2017-02-11
  Administered 2017-02-10 – 2017-02-11 (×3): via INTRAVENOUS

## 2017-02-10 MED ORDER — SODIUM CHLORIDE 0.9 % IV
4 mg/mL | Freq: Three times a day (TID) | INTRAVENOUS | Status: DC
Start: 2017-02-10 — End: 2017-02-10

## 2017-02-10 MED ORDER — SODIUM CHLORIDE 0.9 % IJ SYRG
INTRAMUSCULAR | Status: DC | PRN
Start: 2017-02-10 — End: 2017-02-20
  Administered 2017-02-19: 08:00:00 via INTRAVENOUS

## 2017-02-10 MED ORDER — ENOXAPARIN 40 MG/0.4 ML SUB-Q SYRINGE
40 mg/0.4 mL | SUBCUTANEOUS | Status: DC
Start: 2017-02-10 — End: 2017-02-20
  Administered 2017-02-10 – 2017-02-20 (×11): via SUBCUTANEOUS

## 2017-02-10 MED ORDER — HYDROMORPHONE 2 MG/ML INJECTION SOLUTION
2 mg/mL | INTRAMUSCULAR | Status: DC | PRN
Start: 2017-02-10 — End: 2017-02-10
  Administered 2017-02-10: 05:00:00 via INTRAVENOUS

## 2017-02-10 MED FILL — ENOXAPARIN 40 MG/0.4 ML SUB-Q SYRINGE: 40 mg/0.4 mL | SUBCUTANEOUS | Qty: 0.4

## 2017-02-10 MED FILL — VANCOMYCIN 1,000 MG IV SOLR: 1000 mg | INTRAVENOUS | Qty: 1000

## 2017-02-10 MED FILL — PIPERACILLIN-TAZOBACTAM 3.375 GRAM IV SOLR: 3.375 gram | INTRAVENOUS | Qty: 3.38

## 2017-02-10 MED FILL — MORPHINE 2 MG/ML INJECTION: 2 mg/mL | INTRAMUSCULAR | Qty: 2

## 2017-02-10 MED FILL — NORMAL SALINE FLUSH 0.9 % INJECTION SYRINGE: INTRAMUSCULAR | Qty: 10

## 2017-02-10 MED FILL — HYDROMORPHONE 2 MG/ML INJECTION SOLUTION: 2 mg/mL | INTRAMUSCULAR | Qty: 1

## 2017-02-10 MED FILL — KETOROLAC TROMETHAMINE 30 MG/ML INJECTION: 30 mg/mL (1 mL) | INTRAMUSCULAR | Qty: 1

## 2017-02-10 MED FILL — TRAMADOL 50 MG TAB: 50 mg | ORAL | Qty: 1

## 2017-02-10 MED FILL — CEFAZOLIN 1 GRAM SOLUTION FOR INJECTION: 1 gram | INTRAMUSCULAR | Qty: 1000

## 2017-02-10 MED FILL — MORPHINE 4 MG/ML SYRINGE: 4 mg/mL | INTRAMUSCULAR | Qty: 1

## 2017-02-10 MED FILL — ACETAMINOPHEN 650 MG/20.3 ML ORAL SOLN: 650 mg/20.3 mL | ORAL | Qty: 20.3

## 2017-02-10 MED FILL — SODIUM CHLORIDE 0.9 % IV: INTRAVENOUS | Qty: 1000

## 2017-02-10 MED FILL — PHARMACY VANCOMYCIN NOTE: Qty: 1

## 2017-02-10 MED FILL — VANCOMYCIN IN 0.9% SODIUM CHLORIDE 1.5 G/500 ML IV: 1.5 g/500 mL | INTRAVENOUS | Qty: 500

## 2017-02-10 MED FILL — MONOJECT PREFILL ADVANCED 0.9 % SODIUM CHLORIDE INJECTION SYRINGE: INTRAMUSCULAR | Qty: 40

## 2017-02-10 NOTE — Progress Notes (Signed)
Primary Nurse Guilan Liu, RN and Devon Jones, RN performed a dual skin assessment on this patient No impairment noted  Braden score is 23

## 2017-02-10 NOTE — Progress Notes (Signed)
Bedside shift change report given to Christy (oncoming nurse) by Kelly G (offgoing nurse). Report included the following information SBAR, Kardex and Intake/Output.

## 2017-02-10 NOTE — Progress Notes (Signed)
Pharmacist Note - Vancomycin Dosing  Consult provided for this 37 y.o. female for indication of cellulitis.  Antibiotic regimen(s): Zosyn and Vancomycin    Recent Labs     02/09/17  1536   WBC 12.1*   CREA 0.91   BUN 12     Frequency of BMP: daily  Height: 154.9 cm  Weight: 72.6 kg  Est CrCl: 77 ml/min  Temp (24hrs), Avg:97.7 ??F (36.5 ??C), Min:97.7 ??F (36.5 ??C), Max:97.8 ??F (36.6 ??C)    Cultures: blood      Goal trough = 10 - 15 mcg/mL    Therapy will be initiated with a loading dose of 1500 mg IV x 1 to be followed by a maintenance dose of 1000 mg IV every 12 hours.  Pharmacy to follow patient daily and order levels / make dose adjustments as appropriate.

## 2017-02-10 NOTE — Progress Notes (Signed)
Bedside shift change report given to Kelly RN (oncoming nurse) by Holly RN (offgoing nurse). Report included the following information SBAR, Kardex, ED Summary, Intake/Output, MAR and Recent Results.

## 2017-02-10 NOTE — Progress Notes (Signed)
Nurse Guilan Liu gave bedside report to oncoming nurse Holly Longest RN by SBAR and Kardex

## 2017-02-10 NOTE — Progress Notes (Signed)
Hospitalist Progress Note  [name], MD  Answering service: [phone] OR [phone] from in house phone        Date of Service:  02/10/2017  NAME:  Christina Stevens  DOB:  03-17-1980  MRN:  [id number]      Admission Summary:   37 y.o F with PMH of ADHD,PCOS was admitted due to facial cellulitis.    Interval history / Subjective:     Patient states that she was diagnosed with mononucleosis at her PCP office and was prescribed amoxicillin and steroids few weeks ago.Later she had cut herself while shaving and she continued to use amoxicillin for nearly 14 days. She also mentioned about going to [facility] ER.  Again,she cut herself again while shaving and since then she noticed swelling,difficult to open her mouth and breathing issues.    She still continues to have pain while opening mouth and complaints of pain radiating to her ears       Assessment & Plan:     #Facial cellulitis:  -CT neck did not show any abscess but USG showed soft tissue swelling  -on IV vanc and zosyn  -Discussed with Dr.kerr- ENT over phone -recommended to start IV decadron for swelling and analgesia.    Morbid obesity:  Body mass index is 30.24 kg/m??.  -life style changes and weight loss    Code status:Full  DVT prophylaxis: SCD    Care Plan discussed with: Patient/Family  Disposition: TBD     Hospital Problems  Date Reviewed: Mar 02, 2017          Codes Class Noted POA    Facial cellulitis ICD-10-CM: L03.211  ICD-9-CM: 682.0  2017/03/02 Unknown                Review of Systems:   Pertinent items are noted in HPI.       Vital Signs:    Last 24hrs VS reviewed since prior progress note. Most recent are:  Visit Vitals  BP 121/63 (BP 1 Location: Left arm, BP Patient Position: At rest)   Pulse 72   Temp 98.2 ??F (36.8 ??C)   Resp 16   Ht 5\' 1"  (1.549 m)   Wt 72.6 kg (160 lb 0.9 oz)   SpO2 98%   BMI 30.24 kg/m??         Intake/Output Summary (Last 24 hours) at 02/10/2017 2253   Last data filed at 02/10/2017 1400  Gross per 24 hour   Intake 500 ml   Output ???   Net 500 ml        Physical Examination:             Constitutional:  No acute distress, cooperative, pleasant??   ENT:  Oral mucous moist, no dental caries, erythema and swelling below th chin.  Difficulty in opening mouth completely   Resp:  CTA bilaterally. No wheezing/rhonchi/rales. No accessory muscle use   CV:  Regular rhythm, normal rate, no murmurs, gallops, rubs    GI:  Soft, non distended, non tender. normoactive bowel sounds, no hepatosplenomegaly     Musculoskeletal:  No edema, warm, 2+ pulses throughout    Neurologic:  Moves all extremities.  AAOx3, CN II-XII reviewed            Data Review:    Review and/or order of clinical lab test  Review and/or order of tests in the radiology section of CPT  Review and/or order of tests in the medicine section of CPT  Labs:     Recent Labs     02/10/17  0204 02/09/17  1536   WBC 15.4* 12.1*   HGB 12.9 14.4   HCT 38.3 42.5   PLT 261 281     Recent Labs     02/10/17  0204 02/09/17  1536   NA 138 139   K 3.9 5.0   CL 104 104   CO2 27 28   BUN 10 12   CREA 0.89 0.91   GLU 101* 62*   CA 8.1* 8.8     Recent Labs     02/10/17  0204 02/09/17  1536   SGOT 12* 36   ALT 16 20   AP 55 66   TBILI 0.3 0.5   TP 6.4 7.7   ALB 3.0* 3.5   GLOB 3.4 4.2*     No results for input(s): INR, PTP, APTT in the last 72 hours.    No lab exists for component: INREXT   No results for input(s): FE, TIBC, PSAT, FERR in the last 72 hours.   No results found for: FOL, RBCF   No results for input(s): PH, PCO2, PO2 in the last 72 hours.  No results for input(s): CPK, CKNDX, TROIQ in the last 72 hours.    No lab exists for component: CPKMB  No results found for: CHOL, CHOLX, CHLST, CHOLV, HDL, LDL, LDLC, DLDLP, TGLX, TRIGL, TRIGP, CHHD, CHHDX  No results found for: [facility]  Lab Results   Component Value Date/Time    Color YELLOW/STRAW 02/09/2017 07:34 PM    Appearance CLEAR 02/09/2017 07:34 PM     Specific gravity 1.010 02/09/2017 07:34 PM    pH (UA) 6.0 02/09/2017 07:34 PM    Protein NEGATIVE  02/09/2017 07:34 PM    Glucose NEGATIVE  02/09/2017 07:34 PM    Ketone NEGATIVE  02/09/2017 07:34 PM    Bilirubin NEGATIVE  02/09/2017 07:34 PM    Urobilinogen 0.2 02/09/2017 07:34 PM    Nitrites NEGATIVE  02/09/2017 07:34 PM    Leukocyte Esterase NEGATIVE  02/09/2017 07:34 PM    Epithelial cells MODERATE (A) 02/09/2017 07:34 PM    Bacteria NEGATIVE  02/09/2017 07:34 PM    WBC 0-4 02/09/2017 07:34 PM    RBC 0-5 02/09/2017 07:34 PM         Medications Reviewed:     Current Facility-Administered Medications   Medication Dose Route Frequency   ??? Vancomycin Pharmacy dosing   Other Rx Dosing/Monitoring   ??? vancomycin (VANCOCIN) 1,000 mg in 0.9% sodium chloride 250 mL (Vial2Bag)  1,000 mg IntraVENous Q12H   ??? acetaminophen (TYLENOL) solution 500 mg  500 mg Oral Q6H   ??? ketorolac (TORADOL) injection 15 mg  15 mg IntraVENous Q8H PRN   ??? traMADol (ULTRAM) tablet 25 mg  25 mg Oral Q12H PRN   ??? [START ON 02/11/2017] Vancomycin, Trough - Please draw IMMEDIATELY PRIOR to starting 1200 dose Friday 1/11.   Other ONCE   ??? sodium chloride (NS) flush 5-40 mL  5-40 mL IntraVENous Q8H   ??? sodium chloride (NS) flush 5-40 mL  5-40 mL IntraVENous PRN   ??? enoxaparin (LOVENOX) injection 40 mg  40 mg SubCUTAneous Q24H   ??? 0.9% sodium chloride infusion  75 mL/hr IntraVENous CONTINUOUS   ??? piperacillin-tazobactam (ZOSYN) 3.375 g in 0.9% sodium chloride (MBP/ADV) 100 mL  3.375 g IntraVENous Q8H     ______________________________________________________________________  EXPECTED LENGTH OF STAY: 3d 7h  ACTUAL LENGTH OF STAY:  [place], MD

## 2017-02-10 NOTE — H&P (Signed)
[facility]  HISTORY AND PHYSICAL      Name:Kenner, TERSEA  MR#: [id number]  DOB: Jan 07, 1981  ACCOUNT #: [id number]   ADMIT DATE: 02/09/2017    CHIEF COMPLAINT:  Facial swelling and discomfort for the last 3-4 days, progressively getting worse this morning.    HISTORY OF PRESENT ILLNESS:  Patient is a 37 year old female with a known history of PCOS and ADHD.  As per the patient, about a couple of weeks back, patient was evaluated by her primary care doctor and was found to have some neck swelling, was diagnosed to have mononucleosis, was put on prednisone and amoxicillin.  As per the patient, the symptoms had temporarily resolved.  About 3 days earlier, while shaving, had nicked around the chin area on the right side and then afterwards had noted multiple pustules in the submental and around the chin area.  Several hours later had started noticing the areas were getting more swollen and tender to touch, which has progressively gotten worse.  The patient has been having difficulty swallowing, has a lot of pain.  Does not complain of any shortness of breath, chest tightness or wheezing.  The patient also complains of some left-sided flank pain, which radiates from the left CVA angle to the front.  Has not had any vomiting or diarrhea associated with it.  Has been having off and on chills and night sweats in the last 4 or 5 days.  The patient also complains of choking feeling in the throat.    PAST MEDICAL HISTORY:  History of ADHD and PCOS.    ALLERGIES:  THE PATIENT HAS NO KNOWN ALLERGIES.    MEDICATIONS:  Adderall.    FAMILY HISTORY:  Mother healthy.  Father healthy.    PAST SURGICAL HISTORY:  Status post percutaneous cholecystectomy.  The patient is status post appendectomy.    SOCIAL HISTORY:  10 pack years of smoking.  Denies alcohol abuse.    REVIEW OF SYSTEMS:  CONSTITUTIONAL:  Positive fatigue.  Positive chills.  Positive night sweats.  EYES:  Negative visual problem.  Negative eye pain.   HEENT:  Negative sore throat.  Positive odynophagia.  Positive facial swelling.  PULMONARY:  Positive shortness of breath.  Patient choking feeling.  Negative wheezing.  Negative pleuritic chest pains.  CARDIOVASCULAR:  Negative chest pains.  Negative dyspnea on exertion.  GASTROINTESTINAL:  Negative abdominal pain. Negative vomiting.  Negative diarrhea.  GENITOURINARY:  Positive frequency of urination.    PHYSICAL EXAMINATION:  VITAL SIGNS:  Blood pressure 115/72, pulse rate of 87, respiration of 14 and temperature of 97.8.  HEAD, EYES, EARS, NOSE, THROAT:  Pupils are equal, react to light.  No pallor.  No icterus.  On the face, on the chin and mental area, a lot of scab tissue was noted.  Submental swelling was noted.  Positive pustules are noted.  Local region lymphadenopathy was noted.  Right side of the face was also mildly swollen.  NECK:  Supple.  LUNGS:  Clear.  HEART:  S1, S2 audible.  No S3, S4.  ABDOMEN:  Soft, nontender.  No guarding, no rigidity, no rebound.  EXTREMITIES:  There is no edema.    LABORATORY DATA:  Showed WBC of 12.1, hemoglobin of 14.4, platelets of 201.  UA was normal.  On the CMP showed sodium 139, potassium 5.0, glucose of 62, BUN of 12, creatinine of 0.91.  Lactic acid was 1.8.  Liver enzymes were normal.    ASSESSMENT:  The patient  is a 37 year old female with a history of attention deficit hyperactivity disorder and polycystic ovarian syndrome who presents with facial cellulitis.  A CT scan of the head and neck and ultrasound of the neck and soft tissue negative for any abscess.    PLAN:  Will admit, start IV antibiotics on the patient with vancomycin and Zosyn.  Will get Ear, Nose and hroat (HEENT) consult in the morning.  We will monitor closely for any hypoxia or respiratory issues.  CODE STATUS:  FULL CODE.  DVT prophylaxis:  Lovenox was given.      Darrold JunkerFAIZUR Kebra Lowrimore, MD       FR/LN  D: 02/09/2017 22:47     T: 02/10/2017 02:43  JOB #: [id number]

## 2017-02-10 NOTE — Progress Notes (Signed)
Otolaryngology consult call came in from Dr. Flossie DibbleJulie Kerr and stated that "no surgical need at this time.  To continue antibiotics and possible start steroids".  Dr. Sharl MaKerr stated she would not be able to put a note in until a later date and to contact the attending with this information.  No orders at this time.

## 2017-02-11 ENCOUNTER — Inpatient Hospital Stay: Admit: 2017-02-11 | Payer: BLUE CROSS/BLUE SHIELD | Primary: Primary Care

## 2017-02-11 LAB — METABOLIC PANEL, BASIC
Anion gap: 7 mmol/L (ref 5–15)
BUN/Creatinine ratio: 9 — ABNORMAL LOW (ref 12–20)
BUN: 7 MG/DL (ref 6–20)
CO2: 25 mmol/L (ref 21–32)
Calcium: 7.7 MG/DL — ABNORMAL LOW (ref 8.5–10.1)
Chloride: 107 mmol/L (ref 97–108)
Creatinine: 0.82 MG/DL (ref 0.55–1.02)
GFR est AA: >60 mL/min/{1.73_m2} (ref >60)
GFR est non-AA: >60 mL/min/{1.73_m2} (ref >60)
Glucose: 112 mg/dL — ABNORMAL HIGH (ref 65–100)
Potassium: 3.9 mmol/L (ref 3.5–5.1)
Sodium: 139 mmol/L (ref 136–145)

## 2017-02-11 LAB — CBC WITH AUTOMATED DIFF
ABS. BASOPHILS: 0 10*3/uL (ref 0.0–0.1)
ABS. EOSINOPHILS: 0.2 10*3/uL (ref 0.0–0.4)
ABS. IMM. GRANS.: 0 10*3/uL (ref 0.00–0.04)
ABS. LYMPHOCYTES: 1.2 10*3/uL (ref 0.8–3.5)
ABS. MONOCYTES: 0.2 10*3/uL (ref 0.0–1.0)
ABS. NEUTROPHILS: 7.7 10*3/uL (ref 1.8–8.0)
ABSOLUTE NRBC: 0 10*3/uL (ref 0.00–0.01)
BASOPHILS: 0 % (ref 0–1)
EOSINOPHILS: 2 % (ref 0–7)
HCT: 37.1 % (ref 35.0–47.0)
HGB: 12.4 g/dL (ref 11.5–16.0)
IMMATURE GRANULOCYTES: 0 % (ref 0.0–0.5)
LYMPHOCYTES: 13 % (ref 12–49)
MCH: 33.1 PG (ref 26.0–34.0)
MCHC: 33.4 g/dL (ref 30.0–36.5)
MCV: 98.9 FL (ref 80.0–99.0)
MONOCYTES: 2 % — ABNORMAL LOW (ref 5–13)
MPV: 10.4 FL (ref 8.9–12.9)
NEUTROPHILS: 83 % — ABNORMAL HIGH (ref 32–75)
NRBC: 0 PER 100 WBC
PLATELET: 235 10*3/uL (ref 150–400)
RBC: 3.75 M/uL — ABNORMAL LOW (ref 3.80–5.20)
RDW: 11.6 % (ref 11.5–14.5)
WBC: 9.3 10*3/uL (ref 3.6–11.0)

## 2017-02-11 LAB — VANCOMYCIN, TROUGH: Vancomycin,trough: 6.6 ug/mL (ref 5.0–10.0)

## 2017-02-11 MED ORDER — SODIUM CHLORIDE 0.9 % IV
4 mg/mL | Freq: Two times a day (BID) | INTRAVENOUS | Status: DC
Start: 2017-02-11 — End: 2017-02-10

## 2017-02-11 MED ORDER — SODIUM CHLORIDE 0.9% BOLUS IV
0.9 % | Freq: Once | INTRAVENOUS | Status: AC
Start: 2017-02-11 — End: 2017-02-11
  Administered 2017-02-12: 01:00:00 via INTRAVENOUS

## 2017-02-11 MED ORDER — AMPHETAMINE-DEXTROAMPHETAMINE 10 MG TAB
10 mg | Freq: Every day | ORAL | Status: DC
Start: 2017-02-11 — End: 2017-02-11

## 2017-02-11 MED ORDER — SODIUM CHLORIDE 0.9 % IJ SYRG
Freq: Once | INTRAMUSCULAR | Status: AC
Start: 2017-02-11 — End: 2017-02-11
  Administered 2017-02-12: 01:00:00 via INTRAVENOUS

## 2017-02-11 MED ORDER — VIAL2BAG ADAPTOR (20 MM)
1000 mg | Freq: Three times a day (TID) | Status: DC
Start: 2017-02-11 — End: 2017-02-14
  Administered 2017-02-12 – 2017-02-14 (×8): via INTRAVENOUS

## 2017-02-11 MED ORDER — IOPAMIDOL 61 % IV SOLN
300 mg iodine /mL (61 %) | Freq: Once | INTRAVENOUS | Status: AC
Start: 2017-02-11 — End: 2017-02-11
  Administered 2017-02-12: 01:00:00 via INTRAVENOUS

## 2017-02-11 MED ORDER — HYDROMORPHONE 2 MG/ML INJECTION SOLUTION
2 mg/mL | Freq: Once | INTRAMUSCULAR | Status: AC
Start: 2017-02-11 — End: 2017-02-10
  Administered 2017-02-11: 03:00:00 via INTRAVENOUS

## 2017-02-11 MED ORDER — DEXAMETHASONE SODIUM PHOSPHATE 4 MG/ML IJ SOLN
4 mg/mL | Freq: Three times a day (TID) | INTRAMUSCULAR | Status: DC
Start: 2017-02-11 — End: 2017-02-12
  Administered 2017-02-12 (×2): via INTRAVENOUS

## 2017-02-11 MED ORDER — HYDROMORPHONE 2 MG/ML INJECTION SOLUTION
2 mg/mL | Freq: Three times a day (TID) | INTRAMUSCULAR | Status: DC | PRN
Start: 2017-02-11 — End: 2017-02-14
  Administered 2017-02-12 – 2017-02-14 (×8): via INTRAVENOUS

## 2017-02-11 MED ORDER — DEXAMETHASONE SODIUM PHOSPHATE 4 MG/ML IJ SOLN
4 mg/mL | Freq: Two times a day (BID) | INTRAMUSCULAR | Status: DC
Start: 2017-02-11 — End: 2017-02-11
  Administered 2017-02-11 (×2): via INTRAVENOUS

## 2017-02-11 MED ORDER — OXYCODONE 5 MG TAB
5 mg | Freq: Four times a day (QID) | ORAL | Status: DC | PRN
Start: 2017-02-11 — End: 2017-02-16
  Administered 2017-02-12 – 2017-02-15 (×13): via ORAL

## 2017-02-11 MED ORDER — MORPHINE 2 MG/ML INJECTION
2 mg/mL | INTRAMUSCULAR | Status: DC | PRN
Start: 2017-02-11 — End: 2017-02-11
  Administered 2017-02-11: 15:00:00 via INTRAVENOUS

## 2017-02-11 MED ORDER — AMPHETAMINE-DEXTROAMPHETAMINE 10 MG TAB
10 mg | Freq: Two times a day (BID) | ORAL | Status: DC
Start: 2017-02-11 — End: 2017-02-20
  Administered 2017-02-12 – 2017-02-20 (×9): via ORAL

## 2017-02-11 MED ORDER — AMPHETAMINE-DEXTROAMPHETAMINE 5 MG TAB
5 mg | Freq: Every day | ORAL | Status: DC
Start: 2017-02-11 — End: 2017-02-11

## 2017-02-11 MED ORDER — SODIUM CHLORIDE 0.9 % IV
4 mg/mL | Freq: Three times a day (TID) | INTRAVENOUS | Status: DC
Start: 2017-02-11 — End: 2017-02-11

## 2017-02-11 MED FILL — ACETAMINOPHEN 650 MG/20.3 ML ORAL SOLN: 650 mg/20.3 mL | ORAL | Qty: 20.3

## 2017-02-11 MED FILL — MORPHINE 2 MG/ML INJECTION: 2 mg/mL | INTRAMUSCULAR | Qty: 1

## 2017-02-11 MED FILL — DEXAMETHASONE SODIUM PHOSPHATE 4 MG/ML IJ SOLN: 4 mg/mL | INTRAMUSCULAR | Qty: 1

## 2017-02-11 MED FILL — PIPERACILLIN-TAZOBACTAM 3.375 GRAM IV SOLR: 3.375 gram | INTRAVENOUS | Qty: 3.38

## 2017-02-11 MED FILL — NORMAL SALINE FLUSH 0.9 % INJECTION SYRINGE: INTRAMUSCULAR | Qty: 10

## 2017-02-11 MED FILL — HYDROMORPHONE 2 MG/ML INJECTION SOLUTION: 2 mg/mL | INTRAMUSCULAR | Qty: 1

## 2017-02-11 MED FILL — VANCOMYCIN 1,000 MG IV SOLR: 1000 mg | INTRAVENOUS | Qty: 1000

## 2017-02-11 MED FILL — ENOXAPARIN 40 MG/0.4 ML SUB-Q SYRINGE: 40 mg/0.4 mL | SUBCUTANEOUS | Qty: 0.4

## 2017-02-11 MED FILL — NORMAL SALINE FLUSH 0.9 % INJECTION SYRINGE: INTRAMUSCULAR | Qty: 20

## 2017-02-11 MED FILL — KETOROLAC TROMETHAMINE 30 MG/ML INJECTION: 30 mg/mL (1 mL) | INTRAMUSCULAR | Qty: 1

## 2017-02-11 MED FILL — TRAMADOL 50 MG TAB: 50 mg | ORAL | Qty: 1

## 2017-02-11 MED FILL — AMPHETAMINE-DEXTROAMPHETAMINE 5 MG TAB: 5 mg | ORAL | Qty: 4

## 2017-02-11 NOTE — Progress Notes (Signed)
Hourly rounded on patient.  Patient was being helped back from the bathroom.  Patient's gait was unsteady and she was demonstrating an altered mental status.  Patient was restless and kept saying she was very hot.  RN explained to patient that could be a result of the morphine.  Patient kept stating she needed to go to work and when reminded she did not she stated she needed to call her boss which her spouse said she did earlier in the morning.  Dr. Lin GivensKanagala was notified of changes in patient, went to see the patient, and PRN morphine orders were discontinued.  Will continue to monitor patient.

## 2017-02-11 NOTE — Progress Notes (Addendum)
Patient seen and examined. Consult dictated # [id number]    - continue vanco and zosyn    - ff up ct result    - PLease call Dr. Roxan Hockeyowles if there are any issues tomorrow.

## 2017-02-11 NOTE — Progress Notes (Signed)
Bedside shift change report given to Frank, RN (oncoming nurse) by Tina, RN (offgoing nurse). Report included the following information SBAR and Kardex.

## 2017-02-11 NOTE — Progress Notes (Signed)
Problem: Falls - Risk of  Goal: *Absence of Falls  Document Schmid Fall Risk and appropriate interventions in the flowsheet.  Outcome: Progressing Towards Goal  Fall Risk Interventions:  Mobility Interventions: Communicate number of staff needed for ambulation/transfer, Patient to call before getting OOB         Medication Interventions: Evaluate medications/consider consulting pharmacy, Patient to call before getting OOB, Teach patient to arise slowly    Elimination Interventions: Call light in reach, Patient to call for help with toileting needs, Toileting schedule/hourly rounds

## 2017-02-11 NOTE — Progress Notes (Signed)
Hospitalist Progress Note  Christina Bhat, MD  Answering service: [phone] OR [phone] from in house phone        Date of Service:  02/11/2017  NAME:  Christina Stevens  DOB:  16-Mar-1980  MRN:  [id number]      Admission Summary:   37 y.o F with PMH of ADHD,PCOS was admitted due to facial cellulitis.  Patient states that she was diagnosed with mononucleosis at her PCP office and was prescribed amoxicillin and steroids few weeks ago.Later she had cut herself while shaving and she continued to use amoxicillin for nearly 14 days. She also mentioned about going to [facility] ER.  Again,she cut herself again while shaving and since then she noticed swelling,difficult to open her mouth and breathing issues.      Interval history / Subjective:   She still continues to have neck pain,difficulty opening mouth.  States that the rt side swelling has decreased but left side swelling still persists and she feels left ear is now full and it worse on the left.    She received 1 mg morphine and decadron in the morning-RN called me this afternoon that patient is agitated.On my assessment - she was trying to get of bed and was talking about her office and boss,was agitated. She later slept. In the evening,I reassessed her and she was back to baseline.      Assessment & Plan:     #Facial and neck cellulitis:  -she sustained a nick  while shaving her face.  -CT neck did not show any abscess but USG showed soft tissue swelling  -on IV vanc and zosyn  -ENT following, ordered CT due to worsening symptoms - will follow results.  -will give  0.2 mg dilaudid Q 8 hr prn and 5 mg roxicodone Q 6 hr prn.  -will continue 4 mg decadron for now.Discussed with RN to space out narcotic and steroid to see if she agitated again.  -ID consulted.    Morbid obesity:  Body mass index is 30.24 kg/m??.  -life style changes and weight loss    Episode of agitation:  -steroids vs morphine   -will closely monitor with next dose of steroid  -Discussed with patient and her wife about the possible side effects and benefit of steroid - they agreed to continue one more time and monitor.    #History of ADHD:  -on 20 mg adderall BID -confirmed dose from her pharmacy-will resume.    Code status:Full  DVT prophylaxis: lovenox    Care Plan discussed with: Patient/Family  Disposition: TBD     Hospital Problems  Date Reviewed: 03/01/17          Codes Class Noted POA    Facial cellulitis ICD-10-CM: L03.211  ICD-9-CM: 682.0  03/01/2017 Unknown                Review of Systems:   Pertinent items are noted in HPI.       Vital Signs:    Last 24hrs VS reviewed since prior progress note. Most recent are:  Visit Vitals  BP 110/73 (BP 1 Location: Left arm, BP Patient Position: At rest)   Pulse 82   Temp 98.5 ??F (36.9 ??C)   Resp 16   Ht 5\' 1"  (1.549 m)   Wt 72.6 kg (160 lb 0.9 oz)   SpO2 98%   Breastfeeding? No   BMI 30.24 kg/m??       No intake or output data in the 24 hours  ending 02/11/17 2034     Physical Examination:             Constitutional:  No acute distress, cooperative, pleasant??   ENT:  Oral mucous moist, no dental caries, swelling in the submental area appears to be more organised now.Erythema persists,swelling on the left side of neck.Left pinna tender to palpate.  Difficulty in opening mouth completely   Resp:  CTA bilaterally. No wheezing/rhonchi/rales. No accessory muscle use   CV:  Regular rhythm, normal rate, no murmurs, gallops, rubs    GI:  Soft, non distended, non tender. normoactive bowel sounds, no hepatosplenomegaly     Musculoskeletal:  No edema, warm, 2+ pulses throughout    Neurologic:  Moves all extremities.  AAOx3            Data Review:    Review and/or order of clinical lab test  Review and/or order of tests in the medicine section of CPT      Labs:     Recent Labs     02/11/17  0227 02/10/17  0204   WBC 9.3 15.4*   HGB 12.4 12.9   HCT 37.1 38.3   PLT 235 261     Recent Labs     02/11/17   0227 02/10/17  0204 02/09/17  1536   NA 139 138 139   K 3.9 3.9 5.0   CL 107 104 104   CO2 25 27 28    BUN 7 10 12    CREA 0.82 0.89 0.91   GLU 112* 101* 62*   CA 7.7* 8.1* 8.8     Recent Labs     02/10/17  0204 02/09/17  1536   SGOT 12* 36   ALT 16 20   AP 55 66   TBILI 0.3 0.5   TP 6.4 7.7   ALB 3.0* 3.5   GLOB 3.4 4.2*     No results for input(s): INR, PTP, APTT in the last 72 hours.    No lab exists for component: INREXT, INREXT   No results for input(s): FE, TIBC, PSAT, FERR in the last 72 hours.   No results found for: FOL, RBCF   No results for input(s): PH, PCO2, PO2 in the last 72 hours.  No results for input(s): CPK, CKNDX, TROIQ in the last 72 hours.    No lab exists for component: CPKMB  No results found for: CHOL, CHOLX, CHLST, CHOLV, HDL, LDL, LDLC, DLDLP, TGLX, TRIGL, TRIGP, CHHD, CHHDX  No results found for: [facility]  Lab Results   Component Value Date/Time    Color YELLOW/STRAW 02/09/2017 07:34 PM    Appearance CLEAR 02/09/2017 07:34 PM    Specific gravity 1.010 02/09/2017 07:34 PM    pH (UA) 6.0 02/09/2017 07:34 PM    Protein NEGATIVE  02/09/2017 07:34 PM    Glucose NEGATIVE  02/09/2017 07:34 PM    Ketone NEGATIVE  02/09/2017 07:34 PM    Bilirubin NEGATIVE  02/09/2017 07:34 PM    Urobilinogen 0.2 02/09/2017 07:34 PM    Nitrites NEGATIVE  02/09/2017 07:34 PM    Leukocyte Esterase NEGATIVE  02/09/2017 07:34 PM    Epithelial cells MODERATE (A) 02/09/2017 07:34 PM    Bacteria NEGATIVE  02/09/2017 07:34 PM    WBC 0-4 02/09/2017 07:34 PM    RBC 0-5 02/09/2017 07:34 PM         Medications Reviewed:     Current Facility-Administered Medications   Medication Dose Route Frequency   ??? dextroamphetamine-amphetamine (ADDERALL)  tablet 20 mg  20 mg Oral BID   ??? vancomycin (VANCOCIN) 1,000 mg in 0.9% sodium chloride 250 mL (Vial2Bag)  1,000 mg IntraVENous Q8H   ??? HYDROmorphone (DILAUDID) injection 0.2 mg  0.2 mg IntraVENous Q8H PRN   ??? oxyCODONE IR (ROXICODONE) tablet 5 mg  5 mg Oral Q6H PRN    ??? dexamethasone (DECADRON) 4 mg/mL injection 4 mg  4 mg IntraVENous Q8H   ??? Vancomycin Pharmacy dosing   Other Rx Dosing/Monitoring   ??? acetaminophen (TYLENOL) solution 500 mg  500 mg Oral Q6H   ??? sodium chloride (NS) flush 5-40 mL  5-40 mL IntraVENous Q8H   ??? sodium chloride (NS) flush 5-40 mL  5-40 mL IntraVENous PRN   ??? enoxaparin (LOVENOX) injection 40 mg  40 mg SubCUTAneous Q24H   ??? 0.9% sodium chloride infusion  75 mL/hr IntraVENous CONTINUOUS   ??? piperacillin-tazobactam (ZOSYN) 3.375 g in 0.9% sodium chloride (MBP/ADV) 100 mL  3.375 g IntraVENous Q8H     ______________________________________________________________________  EXPECTED LENGTH OF STAY: 3d 7h  ACTUAL LENGTH OF STAY:          2                 Christina Bhat, MD

## 2017-02-11 NOTE — Consults (Signed)
[facility]  Flossie DibbleJulie Zaylynn Rickett, MD  Pager [phone]  Cell [phone]  Patient: Christina Stevens MRN: [id number]  SSN: [id number]    Date of Birth: 04/02/1980  Age: 37 y.o.  Sex: female          Subjective:   Patient is a 37 y.o.  female who presented for swelling in particular right side face/jaw/submandibular region - had nick in chin and redness and swelling ensued from there.  Patient notes that while on the IV abx and IV steroids in house that the swelling seems to have gotten worse in the submental region - and no extending up to the left - improving on the right            Patient Active Problem List    Diagnosis Date Noted   ??? Facial cellulitis 02/09/2017     Past Medical History:   Diagnosis Date   ??? ADHD    ??? PCOS (polycystic ovarian syndrome)       Past Surgical History:   Procedure Laterality Date   ??? HX APPENDECTOMY     ??? IR CHOLECYSTOSTOMY PERCUTANEOUS        Prior to Admission medications    Medication Sig Start Date End Date Taking? Authorizing Provider   dextroamphetamine-amphetamine (ADDERALL) 20 mg tablet Take 40 mg by mouth daily.   Yes Provider, Historical     Current Facility-Administered Medications   Medication Dose Route Frequency   ??? dextroamphetamine-amphetamine (ADDERALL) tablet 20 mg  20 mg Oral BID   ??? vancomycin (VANCOCIN) 1,000 mg in 0.9% sodium chloride 250 mL (Vial2Bag)  1,000 mg IntraVENous Q8H   ??? Vancomycin Pharmacy dosing   Other Rx Dosing/Monitoring   ??? acetaminophen (TYLENOL) solution 500 mg  500 mg Oral Q6H   ??? traMADol (ULTRAM) tablet 25 mg  25 mg Oral Q12H PRN   ??? sodium chloride (NS) flush 5-40 mL  5-40 mL IntraVENous Q8H   ??? sodium chloride (NS) flush 5-40 mL  5-40 mL IntraVENous PRN   ??? enoxaparin (LOVENOX) injection 40 mg  40 mg SubCUTAneous Q24H   ??? 0.9% sodium chloride infusion  75 mL/hr IntraVENous CONTINUOUS   ??? piperacillin-tazobactam (ZOSYN) 3.375 g in 0.9% sodium chloride (MBP/ADV) 100 mL  3.375 g IntraVENous Q8H      No Known Allergies    Social History     Tobacco Use   ??? Smoking status: Current Every Day Smoker   ??? Smokeless tobacco: Never Used   Substance Use Topics   ??? Alcohol use: No     Frequency: Never      History reviewed. No pertinent family history.     Review of Systems  A comprehensive review of systems was negative except for that written in the HPI.    FAMILY HISTORY: No bleeding disorders, no anesthesia concerns in familiy        Objective:     Patient Vitals for the past 8 hrs:   BP Temp Pulse Resp SpO2   02/11/17 1339 110/73 98.5 ??F (36.9 ??C) 82 16 98 %   02/11/17 0915 122/74 97.2 ??F (36.2 ??C) 89 15 98 %     Temp (24hrs), Avg:98 ??F (36.7 ??C), Min:97.2 ??F (36.2 ??C), Max:98.5 ??F (36.9 ??C)      PHYSICAL EXAMINATION  VITAL SIGNS: as above  GENERAL APPEARANCE: Normal stature and nutrition. Healthy general appearance.  COMMUNICATION/VOICE: Age appropriate communication. Normal pitch and clarity.  HEAD: Atraumatic. No gross craniofacial  deformities. No abnormal masses or lesions on inspection of head.  FACE: No abnormal masses or lesions on inspection and palpation of face and sinuses.  SALIVARY GLANDS: No abnormal masses or lesions on inspection    FACIAL MUSCLE STRENGTH: Normal without droop.  NECK/TRACHEA: Midline.       HEARING: Grossly normal hearing at normal speech tones.   EXTERNAL EARS/NOSE: Normal anatomy of auricles, nose grossly normal without deformity.      Neck: Erythema superiorly and extending ear to ear - submental dominant on right side swelling - indurated - scabs from cut     EYES: Normal ocular motility and gaze.         .  Labs:   Recent Results (from the past 24 hour(s))   METABOLIC PANEL, BASIC    Collection Time: 02/11/17  2:27 AM   Result Value Ref Range    Sodium 139 136 - 145 mmol/L    Potassium 3.9 3.5 - 5.1 mmol/L    Chloride 107 97 - 108 mmol/L    CO2 25 21 - 32 mmol/L    Anion gap 7 5 - 15 mmol/L    Glucose 112 (H) 65 - 100 mg/dL    BUN 7 6 - 20 MG/DL    Creatinine 1.61 0.96 - 1.02 MG/DL     BUN/Creatinine ratio 9 (L) 12 - 20      GFR est AA >60 >60 ml/min/1.33m2    GFR est non-AA >60 >60 ml/min/1.17m2    Calcium 7.7 (L) 8.5 - 10.1 MG/DL   CBC WITH AUTOMATED DIFF    Collection Time: 02/11/17  2:27 AM   Result Value Ref Range    WBC 9.3 3.6 - 11.0 K/uL    RBC 3.75 (L) 3.80 - 5.20 M/uL    HGB 12.4 11.5 - 16.0 g/dL    HCT 04.5 40.9 - 81.1 %    MCV 98.9 80.0 - 99.0 FL    MCH 33.1 26.0 - 34.0 PG    MCHC 33.4 30.0 - 36.5 g/dL    RDW 91.4 78.2 - 95.6 %    PLATELET 235 150 - 400 K/uL    MPV 10.4 8.9 - 12.9 FL    NRBC 0.0 0 PER 100 WBC    ABSOLUTE NRBC 0.00 0.00 - 0.01 K/uL    NEUTROPHILS 83 (H) 32 - 75 %    LYMPHOCYTES 13 12 - 49 %    MONOCYTES 2 (L) 5 - 13 %    EOSINOPHILS 2 0 - 7 %    BASOPHILS 0 0 - 1 %    IMMATURE GRANULOCYTES 0 0.0 - 0.5 %    ABS. NEUTROPHILS 7.7 1.8 - 8.0 K/UL    ABS. LYMPHOCYTES 1.2 0.8 - 3.5 K/UL    ABS. MONOCYTES 0.2 0.0 - 1.0 K/UL    ABS. EOSINOPHILS 0.2 0.0 - 0.4 K/UL    ABS. BASOPHILS 0.0 0.0 - 0.1 K/UL    ABS. IMM. GRANS. 0.0 0.00 - 0.04 K/UL    DF AUTOMATED     VANCOMYCIN, TROUGH    Collection Time: 02/11/17 12:15 PM   Result Value Ref Range    Vancomycin,trough 6.6 5.0 - 10.0 ug/mL    Reported dose date: NOT PROVIDED      Reported dose time: NOT PROVIDED      Reported dose: NOT PROVIDED UNITS       IMAGING :CT neck no abscess 1/19  Assessment:   Facial cellulitis - neck cellulitis  ? If  abscess formation now      Plan:   Cont IV abx and rec IV steroids as well - likely at least 72 hours -   Repeat CT neck with con to see if abscess has formed submental      Thank you for this consult  Flossie Dibble, MD

## 2017-02-11 NOTE — Progress Notes (Signed)
Day #2 of 7 Vancomycin  Indication: facial cellulitis  -CT neck did not show any abscess but USG showed soft tissue swelling  Current regimen:  1000 mg IV Q12H  Abx regimen:  vancomycin and zosyn  ID Following ?: NO - consulted  Concomitant nephrotoxic drugs (requires more frequent monitoring): NSAIDs (complete)  Frequency of BMP?: daily through 1/12    Recent Labs     02/11/17  0227 02/10/17  0204 02/09/17  1536   WBC 9.3 15.4* 12.1*   CREA 0.82 0.89 0.91   BUN 7 10 12      Est CrCl: 85.6 ml/min; UO: -- ml/kg/hr (unmeasured x 4)  Temp (24hrs), Avg:98 ??F (36.7 ??C), Min:97.2 ??F (36.2 ??C), Max:98.5 ??F (36.9 ??C)    Cultures:   1/9 blood - NGTD  Urine on hold    Goal trough = 10 - 15 mcg/mL    Recent trough history (date/time/level/dose/action taken):  1/11 @ 1215 = 6.6 mcg/ml (~13 hr post dose, extrapolated to 7.45 mcg/ml) - change to 1000 mg IV Q8H    Plan: Change to 1000 mg IV Q8H .  Pharmacy to follow patient daily and order levels / make dose adjustments as appropriate.      Christina Stevens, PharmD

## 2017-02-11 NOTE — Progress Notes (Signed)
Nurse Guilan Liu gave bedside report to oncoming nurse Tina RN by SBAR and Kardex

## 2017-02-11 NOTE — Consults (Signed)
[place]    Name:Christina Stevens, TERSEA  MR#: [id number]  DOB: Dec 19, 1980  ACCOUNT #: [id number]   DATE OF SERVICE: 02/11/2017    REQUESTING PHYSICIAN:  Sena Slate, MD    REASON FOR CONSULTATION:  Cellulitis.    HISTORY OF PRESENT ILLNESS:  The patient is a 37 year old woman whose medical history is significant for ADHD as well as polycystic ovarian syndrome.  According to her, she has felt run down for the last month.  She tested positive for mononucleosis through a blood test.  A few days ago, she developed some swelling in the submental area.  She said that there was some blistering on her chin which these blisters had some purulence in them.  She did not have any fevers with this because of her symptoms, she came to the emergency room and was subsequently admitted.  She tells me that she has had a hard time opening her mouth because of pain.  She has not had any penetrating injury and her vaccines are up to date.  We are now being asked to see her in consult.  She is currently on vancomycin and Zosyn.    ALLERGIES:  NO KNOWN DRUG ALLERGIES.    CURRENT MEDICATIONS:  1.  Tylenol.  2.  Decadron.  3.  Adderall.  4.  Lovenox.  5.  Zosyn.   6.  Vancomycin.    REVIEW OF SYSTEMS:  She does not have any anorexia.  No headache, no sore throat, no shortness of breath, and no rash.  No urinary symptoms aside from the things mentioned in the history. The rest of the review of systems is negative.    PAST MEDICAL HISTORY:  1.  ADHD.  2.  PCOS    PAST SURGICAL HISTORY:  She has had percutaneous cholecystectomy.  She has also had an appendectomy.    FAMILY HISTORY:  Her parents are healthy.    SOCIAL HISTORY:  Has smoked tobacco.  Does not do drugs and does not engage in recreational drug use.    PHYSICAL EXAMINATION:  GENERAL:  She is in some distress.  VITAL SIGNS:  Temperature is 97.9, pulse 68, blood pressure 127/72, satting at 98%.   HEENT:  She has pink conjunctivae, anicteric sclerae.  No JVD.  No cervical lymphadenopathy.  External ears are normal on the submental area there is an indurated area that is tender to touch.  LUNGS:  Clear to auscultation.  No rales, wheeze or rhonchi.  CARDIOVASCULAR:  Regular rate and rhythm.  No murmur, rubs or clicks.  ABDOMEN:  Soft, nontender.  No guarding or rebound. There is no splenomegaly.   GENITOURINARY:  No CVA tenderness.  MUSCULOSKELETAL:  Knees and ankles are not warm and not tender.  PSYCHIATRIC:  No disturbance in thought.  Mood and affect are appropriate.  NEUROLOGIC:  She has full use of her extraocular muscles.  Tongue midline.  No facial asymmetry.  Muscle strength is equal.      LABORATORY DATA:  White blood cell count 9.3, which is down from 15.4.  Urinalysis shows 0-4 white blood cells.  Creatinine 0.82.  AST 12, ALT 16, alk phos 55, total bilirubin 0.3.  Blood cultures is negative.    ASSESSMENT:  1.  Submental collection.  2.  Attention deficit hyperactivity disorder.  3.  Polycystic ovarian syndrome.     PLAN:  The patient has a small collection underneath the chin.  I would continue vancomycin and Zosyn.  This  collection may need to be drained.    Thank you for the consult.  Please call Dr. Carollee Sires tomorrow or over the weekend if there any issues.      Janyth Pupa, MD       AG / VN  D: 02/11/2017 21:37     T: 02/11/2017 22:02  JOB #: [id number]

## 2017-02-11 NOTE — Consults (Signed)
[place]    Name:Christina Stevens, Christina Stevens  MR#: [id number]  DOB: 1980-12-03  ACCOUNT #: [id number]   DATE OF SERVICE: 02/11/2017    REQUESTING PHYSICIAN:  Sena Slate, MD    REASON FOR CONSULTATION:  Cellulitis.    HISTORY OF PRESENT ILLNESS:  The patient is a 37 year old woman whose medical history is significant for ADHD as well as polycystic ovarian syndrome.  According to her, she has felt run down for the last month.  She tested positive for mononucleosis through a blood test.  A few days ago, she developed some swelling in the submental area.  She said that there was some blistering on her chin which these blisters had some purulence in them.  She did not have any fevers with this because of her symptoms, she came to the emergency room and was subsequently admitted.  She tells me that she has had a hard time opening her mouth because of pain.  She has not had any penetrating injury and her vaccines are up to date.  We are now being asked to see her in consult.  She is currently on vancomycin and Zosyn.    ALLERGIES:  NO KNOWN DRUG ALLERGIES.    CURRENT MEDICATIONS:  1.  Tylenol.  2.  Decadron.  3.  Adderall.  4.  Lovenox.  5.  Zosyn.   6.  Vancomycin.    REVIEW OF SYSTEMS:  She does not have any anorexia.  No headache, no sore throat, no shortness of breath, and no rash.  No urinary symptoms aside from the things mentioned in the history. The rest of the review of systems is negative.    PAST MEDICAL HISTORY:  1.  ADHD.  2.  PCOS    PAST SURGICAL HISTORY:  She has had percutaneous cholecystectomy.  She has also had an appendectomy.    FAMILY HISTORY:  Her parents are healthy.    SOCIAL HISTORY:  Has smoked tobacco.  Does not do drugs and does not engage in recreational drug use.    PHYSICAL EXAMINATION:  GENERAL:  She is in some distress.  VITAL SIGNS:  Temperature is 97.9, pulse 68, blood pressure 127/72, satting at 98%.  HEENT:  She has pink conjunctivae, anicteric sclerae.   No JVD.  No cervical lymphadenopathy.  External ears are normal on the submental area there is an indurated area that is tender to touch.  LUNGS:  Clear to auscultation.  No rales, wheeze or rhonchi.  CARDIOVASCULAR:  Regular rate and rhythm.  No murmur, rubs or clicks.  ABDOMEN:  Soft, nontender.  No guarding or rebound. There is no splenomegaly.   GENITOURINARY:  No CVA tenderness.  MUSCULOSKELETAL:  Knees and ankles are not warm and not tender.  PSYCHIATRIC:  No disturbance in thought.  Mood and affect are appropriate.  NEUROLOGIC:  She has full use of her extraocular muscles.  Tongue midline.  No facial asymmetry.  Muscle strength is equal.      LABORATORY DATA:  White blood cell count 9.3, which is down from 15.4.  Urinalysis shows 0-4 white blood cells.  Creatinine 0.82.  AST 12, ALT 16, alk phos 55, total bilirubin 0.3.  Blood cultures is negative.    ASSESSMENT:  1.  Submental collection.  2.  Attention deficit hyperactivity disorder.  3.  Polycystic ovarian syndrome.     PLAN:  The patient has a small collection underneath the chin.  I would continue vancomycin and Zosyn.  This  collection may need to be drained.    Thank you for the consult.  Please call Dr. Carollee Sires tomorrow or over the weekend if there any issues.      Janyth Pupa, MD       AG / VN  D: 02/11/2017 21:37     T: 02/11/2017 22:02  JOB #: [id number]

## 2017-02-11 NOTE — Consults (Signed)
[facility]  Flossie DibbleJulie Aira Sallade, MD  Pager [phone]  Cell [phone]    Patient: Christina Stevens MRN: [id number]  SSN: [id number]    Date of Birth: 1980-11-01  Age: 37 y.o.  Sex: female            Subjective:   Patient is a 37 y.o.  female who presented for swelling in particular right side face/jaw/submandibular region - had nick in chin and redness and swelling ensued from there.  Patient notes that while on the IV abx and IV steroids in house that the swelling seems to have gotten worse in the submental region - and no extending up to the left - improving on the right            Patient Active Problem List    Diagnosis Date Noted   ??? Facial cellulitis 02/09/2017     Past Medical History:   Diagnosis Date   ??? ADHD    ??? PCOS (polycystic ovarian syndrome)       Past Surgical History:   Procedure Laterality Date   ??? HX APPENDECTOMY     ??? IR CHOLECYSTOSTOMY PERCUTANEOUS        Prior to Admission medications    Medication Sig Start Date End Date Taking? Authorizing Provider   dextroamphetamine-amphetamine (ADDERALL) 20 mg tablet Take 40 mg by mouth daily.   Yes Provider, Historical     Current Facility-Administered Medications   Medication Dose Route Frequency   ??? dextroamphetamine-amphetamine (ADDERALL) tablet 20 mg  20 mg Oral BID   ??? vancomycin (VANCOCIN) 1,000 mg in 0.9% sodium chloride 250 mL (Vial2Bag)  1,000 mg IntraVENous Q8H   ??? Vancomycin Pharmacy dosing   Other Rx Dosing/Monitoring   ??? acetaminophen (TYLENOL) solution 500 mg  500 mg Oral Q6H   ??? traMADol (ULTRAM) tablet 25 mg  25 mg Oral Q12H PRN   ??? sodium chloride (NS) flush 5-40 mL  5-40 mL IntraVENous Q8H   ??? sodium chloride (NS) flush 5-40 mL  5-40 mL IntraVENous PRN   ??? enoxaparin (LOVENOX) injection 40 mg  40 mg SubCUTAneous Q24H   ??? 0.9% sodium chloride infusion  75 mL/hr IntraVENous CONTINUOUS   ??? piperacillin-tazobactam (ZOSYN) 3.375 g in 0.9% sodium chloride (MBP/ADV) 100 mL  3.375 g IntraVENous Q8H      No Known  Allergies   Social History     Tobacco Use   ??? Smoking status: Current Every Day Smoker   ??? Smokeless tobacco: Never Used   Substance Use Topics   ??? Alcohol use: No     Frequency: Never      History reviewed. No pertinent family history.     Review of Systems  A comprehensive review of systems was negative except for that written in the HPI.    FAMILY HISTORY: No bleeding disorders, no anesthesia concerns in familiy        Objective:     Patient Vitals for the past 8 hrs:   BP Temp Pulse Resp SpO2   02/11/17 1339 110/73 98.5 ??F (36.9 ??C) 82 16 98 %   02/11/17 0915 122/74 97.2 ??F (36.2 ??C) 89 15 98 %     Temp (24hrs), Avg:98 ??F (36.7 ??C), Min:97.2 ??F (36.2 ??C), Max:98.5 ??F (36.9 ??C)      PHYSICAL EXAMINATION  VITAL SIGNS: as above  GENERAL APPEARANCE: Normal stature and nutrition. Healthy general appearance.  COMMUNICATION/VOICE: Age appropriate communication. Normal pitch and clarity.  HEAD:  Atraumatic. No gross craniofacial deformities. No abnormal masses or lesions on inspection of head.  FACE: No abnormal masses or lesions on inspection and palpation of face and sinuses.  SALIVARY GLANDS: No abnormal masses or lesions on inspection    FACIAL MUSCLE STRENGTH: Normal without droop.  NECK/TRACHEA: Midline.       HEARING: Grossly normal hearing at normal speech tones.   EXTERNAL EARS/NOSE: Normal anatomy of auricles, nose grossly normal without deformity.      Neck: Erythema superiorly and extending ear to ear - submental dominant on right side swelling - indurated - scabs from cut     EYES: Normal ocular motility and gaze.         .  Labs:   Recent Results (from the past 24 hour(s))   METABOLIC PANEL, BASIC    Collection Time: 02/11/17  2:27 AM   Result Value Ref Range    Sodium 139 136 - 145 mmol/L    Potassium 3.9 3.5 - 5.1 mmol/L    Chloride 107 97 - 108 mmol/L    CO2 25 21 - 32 mmol/L    Anion gap 7 5 - 15 mmol/L    Glucose 112 (H) 65 - 100 mg/dL    BUN 7 6 - 20 MG/DL    Creatinine 1.61 0.96 - 1.02 MG/DL     BUN/Creatinine ratio 9 (L) 12 - 20      GFR est AA >60 >60 ml/min/1.47m2    GFR est non-AA >60 >60 ml/min/1.77m2    Calcium 7.7 (L) 8.5 - 10.1 MG/DL   CBC WITH AUTOMATED DIFF    Collection Time: 02/11/17  2:27 AM   Result Value Ref Range    WBC 9.3 3.6 - 11.0 K/uL    RBC 3.75 (L) 3.80 - 5.20 M/uL    HGB 12.4 11.5 - 16.0 g/dL    HCT 04.5 40.9 - 81.1 %    MCV 98.9 80.0 - 99.0 FL    MCH 33.1 26.0 - 34.0 PG    MCHC 33.4 30.0 - 36.5 g/dL    RDW 91.4 78.2 - 95.6 %    PLATELET 235 150 - 400 K/uL    MPV 10.4 8.9 - 12.9 FL    NRBC 0.0 0 PER 100 WBC    ABSOLUTE NRBC 0.00 0.00 - 0.01 K/uL    NEUTROPHILS 83 (H) 32 - 75 %    LYMPHOCYTES 13 12 - 49 %    MONOCYTES 2 (L) 5 - 13 %    EOSINOPHILS 2 0 - 7 %    BASOPHILS 0 0 - 1 %    IMMATURE GRANULOCYTES 0 0.0 - 0.5 %    ABS. NEUTROPHILS 7.7 1.8 - 8.0 K/UL    ABS. LYMPHOCYTES 1.2 0.8 - 3.5 K/UL    ABS. MONOCYTES 0.2 0.0 - 1.0 K/UL    ABS. EOSINOPHILS 0.2 0.0 - 0.4 K/UL    ABS. BASOPHILS 0.0 0.0 - 0.1 K/UL    ABS. IMM. GRANS. 0.0 0.00 - 0.04 K/UL    DF AUTOMATED     VANCOMYCIN, TROUGH    Collection Time: 02/11/17 12:15 PM   Result Value Ref Range    Vancomycin,trough 6.6 5.0 - 10.0 ug/mL    Reported dose date: NOT PROVIDED      Reported dose time: NOT PROVIDED      Reported dose: NOT PROVIDED UNITS       IMAGING :CT neck no abscess 1/19  Assessment:   Facial cellulitis - neck  cellulitis  ? If abscess formation now      Plan:   Cont IV abx and rec IV steroids as well - likely at least 72 hours -   Repeat CT neck with con to see if abscess has formed submental      Thank you for this consult  Flossie Dibble, MD

## 2017-02-12 LAB — METABOLIC PANEL, BASIC
Anion gap: 11 mmol/L (ref 5–15)
BUN/Creatinine ratio: 8 — ABNORMAL LOW (ref 12–20)
BUN: 6 MG/DL (ref 6–20)
CO2: 18 mmol/L — ABNORMAL LOW (ref 21–32)
Calcium: 8.4 MG/DL — ABNORMAL LOW (ref 8.5–10.1)
Chloride: 110 mmol/L — ABNORMAL HIGH (ref 97–108)
Creatinine: 0.77 MG/DL (ref 0.55–1.02)
GFR est AA: >60 mL/min/{1.73_m2} (ref >60)
GFR est non-AA: >60 mL/min/{1.73_m2} (ref >60)
Glucose: 148 mg/dL — ABNORMAL HIGH (ref 65–100)
Potassium: 4.2 mmol/L (ref 3.5–5.1)
Sodium: 139 mmol/L (ref 136–145)

## 2017-02-12 LAB — CBC W/O DIFF
ABSOLUTE NRBC: 0 10*3/uL (ref 0.00–0.01)
HCT: 34.6 % — ABNORMAL LOW (ref 35.0–47.0)
HGB: 12 g/dL (ref 11.5–16.0)
MCH: 32.9 PG (ref 26.0–34.0)
MCHC: 34.7 g/dL (ref 30.0–36.5)
MCV: 94.8 FL (ref 80.0–99.0)
MPV: 10.4 FL (ref 8.9–12.9)
NRBC: 0 PER 100 WBC
PLATELET: 277 10*3/uL (ref 150–400)
RBC: 3.65 M/uL — ABNORMAL LOW (ref 3.80–5.20)
RDW: 11.6 % (ref 11.5–14.5)
WBC: 16.1 10*3/uL — ABNORMAL HIGH (ref 3.6–11.0)

## 2017-02-12 MED ORDER — DEXAMETHASONE SODIUM PHOSPHATE 4 MG/ML IJ SOLN
4 mg/mL | Freq: Three times a day (TID) | INTRAMUSCULAR | Status: DC
Start: 2017-02-12 — End: 2017-02-13
  Administered 2017-02-12 – 2017-02-13 (×4): via INTRAVENOUS

## 2017-02-12 MED FILL — VANCOMYCIN 1,000 MG IV SOLR: 1000 mg | INTRAVENOUS | Qty: 1000

## 2017-02-12 MED FILL — HYDROMORPHONE 2 MG/ML INJECTION SOLUTION: 2 mg/mL | INTRAMUSCULAR | Qty: 1

## 2017-02-12 MED FILL — PIPERACILLIN-TAZOBACTAM 3.375 GRAM IV SOLR: 3.375 gram | INTRAVENOUS | Qty: 3.38

## 2017-02-12 MED FILL — ACETAMINOPHEN 650 MG/20.3 ML ORAL SOLN: 650 mg/20.3 mL | ORAL | Qty: 20.3

## 2017-02-12 MED FILL — ISOVUE-300  61 % INTRAVENOUS SOLUTION: 300 mg iodine /mL (61 %) | INTRAVENOUS | Qty: 100

## 2017-02-12 MED FILL — SODIUM CHLORIDE 0.9 % IV: INTRAVENOUS | Qty: 100

## 2017-02-12 MED FILL — NORMAL SALINE FLUSH 0.9 % INJECTION SYRINGE: INTRAMUSCULAR | Qty: 10

## 2017-02-12 MED FILL — OXYCODONE 5 MG TAB: 5 mg | ORAL | Qty: 1

## 2017-02-12 MED FILL — NORMAL SALINE FLUSH 0.9 % INJECTION SYRINGE: INTRAMUSCULAR | Qty: 20

## 2017-02-12 MED FILL — ENOXAPARIN 40 MG/0.4 ML SUB-Q SYRINGE: 40 mg/0.4 mL | SUBCUTANEOUS | Qty: 0.4

## 2017-02-12 MED FILL — DEXAMETHASONE SODIUM PHOSPHATE 4 MG/ML IJ SOLN: 4 mg/mL | INTRAMUSCULAR | Qty: 1

## 2017-02-12 MED FILL — DEXAMETHASONE SODIUM PHOSPHATE 4 MG/ML IJ SOLN: 4 mg/mL | INTRAMUSCULAR | Qty: 2

## 2017-02-12 MED FILL — AMPHETAMINE-DEXTROAMPHETAMINE 5 MG TAB: 5 mg | ORAL | Qty: 4

## 2017-02-12 NOTE — Progress Notes (Addendum)
Hospitalist Progress Note  Madelaine BhatAnusha Tylek Boney, MD  Answering service: [phone] OR [phone] from in house phone        Date of Service:  02/12/2017  NAME:  Christina Stevens  DOB:  10/06/1980  MRN:  [id number]      Admission Summary:   37 y.o F with PMH of ADHD,PCOS was admitted due to facial cellulitis.  Patient states that she was diagnosed with mononucleosis at her PCP office and was prescribed amoxicillin and steroids few weeks ago.Later she had cut herself while shaving and she continued to use amoxicillin for nearly 14 days. She also mentioned about going to [facility].  Again,she cut herself again while shaving and since then she noticed swelling,difficult to open her mouth and breathing issues.      Interval history / Subjective:     She still has pain on the left side of the neck. Rt side is better.     Assessment & Plan:     #Facial and neck cellulitis:  -she sustained a nick  while shaving her face.  -CT neck  On admission did not show any abscess but USG showed soft tissue swelling  -on IV vanc and zosyn  -ENT and ID following  -white count increased today.  -CT neck on 02/11/2017 showed fluid collection under chin,per Dr.kerr it is phlegmon and not abscess yet.  -will give  0.2 mg dilaudid Q 8 hr prn and 5 mg roxicodone Q 6 hr prn.  -will increase the steroids to 8 mg Q 8hr,she tolerated 4 mg decadron last night - no agitation issues        Morbid obesity:  Body mass index is 30.24 kg/m??.  -life style changes and weight loss    Episode of agitation:resolved      #History of ADHD:  -on 20 mg adderall BID -confirmed dose from her pharmacy-will resume.    Code status:Full  DVT prophylaxis: lovenox    Care Plan discussed with: Patient/Family  Disposition: TBD     Hospital Problems  Date Reviewed: 02/09/2017          Codes Class Noted POA    Facial cellulitis ICD-10-CM: L03.211  ICD-9-CM: 682.0  02/09/2017 Unknown                Review of Systems:    Pertinent items are noted in HPI.       Vital Signs:    Last 24hrs VS reviewed since prior progress note. Most recent are:  Visit Vitals  BP 123/71 (BP 1 Location: Left arm, BP Patient Position: At rest)   Pulse (!) 54   Temp 97.8 ??F (36.6 ??C)   Resp 16   Ht 5\' 1"  (1.549 m)   Wt 72.6 kg (160 lb 0.9 oz)   SpO2 98%   Breastfeeding? No   BMI 30.24 kg/m??       No intake or output data in the 24 hours ending 02/12/17 1117     Physical Examination:             Constitutional:  No acute distress, cooperative, pleasant??   ENT:  Oral mucous moist, no dental caries, swelling in the submental area appears to be more organised now.Erythema persists,swelling on the left side of neck.Left pinna less tender to palpate.  Difficulty in opening mouth completely   Resp:  CTA bilaterally. No wheezing/rhonchi/rales. No accessory muscle use   CV:  Regular rhythm, normal rate, no murmurs, gallops, rubs    GI:  Soft, non distended, non tender. normoactive bowel sounds, no hepatosplenomegaly     Musculoskeletal:  No edema, warm, 2+ pulses throughout    Neurologic:  Moves all extremities.  AAOx3            Data Review:    Review and/or order of clinical lab test  Review and/or order of tests in the medicine section of CPT      Labs:     Recent Labs     02/12/17  0503 02/11/17  0227   WBC 16.1* 9.3   HGB 12.0 12.4   HCT 34.6* 37.1   PLT 277 235     Recent Labs     02/12/17  0503 02/11/17  0227 02/10/17  0204   NA 139 139 138   K 4.2 3.9 3.9   CL 110* 107 104   CO2 18* 25 27   BUN 6 7 10    CREA 0.77 0.82 0.89   GLU 148* 112* 101*   CA 8.4* 7.7* 8.1*     Recent Labs     02/10/17  0204 02/09/17  1536   SGOT 12* 36   ALT 16 20   AP 55 66   TBILI 0.3 0.5   TP 6.4 7.7   ALB 3.0* 3.5   GLOB 3.4 4.2*     No results for input(s): INR, PTP, APTT in the last 72 hours.    No lab exists for component: INREXT, INREXT   No results for input(s): FE, TIBC, PSAT, FERR in the last 72 hours.   No results found for: FOL, RBCF    No results for input(s): PH, PCO2, PO2 in the last 72 hours.  No results for input(s): CPK, CKNDX, TROIQ in the last 72 hours.    No lab exists for component: CPKMB  No results found for: CHOL, CHOLX, CHLST, CHOLV, HDL, LDL, LDLC, DLDLP, TGLX, TRIGL, TRIGP, CHHD, CHHDX  No results found for: [facility]  Lab Results   Component Value Date/Time    Color YELLOW/STRAW 02/09/2017 07:34 PM    Appearance CLEAR 02/09/2017 07:34 PM    Specific gravity 1.010 02/09/2017 07:34 PM    pH (UA) 6.0 02/09/2017 07:34 PM    Protein NEGATIVE  02/09/2017 07:34 PM    Glucose NEGATIVE  02/09/2017 07:34 PM    Ketone NEGATIVE  02/09/2017 07:34 PM    Bilirubin NEGATIVE  02/09/2017 07:34 PM    Urobilinogen 0.2 02/09/2017 07:34 PM    Nitrites NEGATIVE  02/09/2017 07:34 PM    Leukocyte Esterase NEGATIVE  02/09/2017 07:34 PM    Epithelial cells MODERATE (A) 02/09/2017 07:34 PM    Bacteria NEGATIVE  02/09/2017 07:34 PM    WBC 0-4 02/09/2017 07:34 PM    RBC 0-5 02/09/2017 07:34 PM         Medications Reviewed:     Current Facility-Administered Medications   Medication Dose Route Frequency   ??? dexamethasone (DECADRON) 4 mg/mL injection 8 mg  8 mg IntraVENous Q8H   ??? dextroamphetamine-amphetamine (ADDERALL) tablet 20 mg  20 mg Oral BID   ??? vancomycin (VANCOCIN) 1,000 mg in 0.9% sodium chloride 250 mL (Vial2Bag)  1,000 mg IntraVENous Q8H   ??? HYDROmorphone (DILAUDID) injection 0.2 mg  0.2 mg IntraVENous Q8H PRN   ??? oxyCODONE IR (ROXICODONE) tablet 5 mg  5 mg Oral Q6H PRN   ??? Vancomycin Pharmacy dosing   Other Rx Dosing/Monitoring   ??? acetaminophen (TYLENOL) solution 500 mg  500 mg Oral Q6H   ???  sodium chloride (NS) flush 5-40 mL  5-40 mL IntraVENous Q8H   ??? sodium chloride (NS) flush 5-40 mL  5-40 mL IntraVENous PRN   ??? enoxaparin (LOVENOX) injection 40 mg  40 mg SubCUTAneous Q24H   ??? 0.9% sodium chloride infusion  75 mL/hr IntraVENous CONTINUOUS   ??? piperacillin-tazobactam (ZOSYN) 3.375 g in 0.9% sodium chloride  (MBP/ADV) 100 mL  3.375 g IntraVENous Q8H     ______________________________________________________________________  EXPECTED LENGTH OF STAY: 3d 7h  ACTUAL LENGTH OF STAY:          3                 [name], MD

## 2017-02-12 NOTE — Progress Notes (Signed)
Day #3 of Vancomycin  Indication: facial cellulitis  Current regimen: vanc 1g IV q8H  Abx regimen:  vancomycin and zosyn  ID Following ?: YES  Concomitant nephrotoxic drugs (requires more frequent monitoring): NSAIDs  Frequency of BMP?: daily     Recent Labs     02/12/17  0503 02/11/17  0227 02/10/17  0204   WBC 16.1* 9.3 15.4*   CREA 0.77 0.82 0.89   BUN 6 7 10      Est CrCl: 78.8 ml/min; UO: -- ml/kg/hr  Temp (24hrs), Avg:98 ??F (36.7 ??C), Min:97.8 ??F (36.6 ??C), Max:98.5 ??F (36.9 ??C)    Cultures:   1/9 blood - NGx3 days  Urine on hold    Goal trough = 10 - 15 mcg/mL    Recent trough history (date/time/level/dose/action taken):  1/11 @ 1200 = 6.6 mcg/ml on vanc 1g q12h. Switched to vanc 1g q8h    Plan: Continue current regimen. Will assess regimen once at steady state.     Christina LucksJessica Stevens, [place]  [phone]

## 2017-02-12 NOTE — Consults (Signed)
HD 3 submental/neck cellulitis  Patient this AM is improving with the steroids back on - had to hold due to mental status changes with morphine OR decadron    Erythema still neck and swelling submental  I indep looked at CT neck done last night and though rad states fluid accumulation 1.6 cm it is a phlegmon - no cystic lined sac that is 1.6 cm for drainage is there - phlegmon - not abscess yet    Assessment:  Phlegmon with cellulitis neck  Plan:  Patient is back on the decadron - would like doses to be 8-9 mg q8 hours or q 6 hours  Cont the IV abx as well per ID  Will cont to follow for improvement - suspect will imp with the steroids back on    Orest Dygert MD

## 2017-02-12 NOTE — Progress Notes (Signed)
Bedside shift change report given to Linda (oncoming nurse) by Frank (offgoing nurse). Report included the following information SBAR, Kardex, Intake/Output, MAR and Recent Results.

## 2017-02-12 NOTE — Progress Notes (Signed)
Bedside and Verbal shift change report given to Christy, RN (oncoming nurse) by Linda, RN (offgoing nurse). Report included the following information SBAR, Kardex, Procedure Summary, Intake/Output, MAR and Recent Results.

## 2017-02-12 NOTE — Consults (Signed)
HD 3 submental/neck cellulitis  Patient this AM is improving with the steroids back on - had to hold due to mental status changes with morphine OR decadron    Erythema still neck and swelling submental  I indep looked at CT neck done last night and though rad states fluid accumulation 1.6 cm it is a phlegmon - no cystic lined sac that is 1.6 cm for drainage is there - phlegmon - not abscess yet    Assessment:  Phlegmon with cellulitis neck  Plan:  Patient is back on the decadron - would like doses to be 8-9 mg q8 hours or q 6 hours  Cont the IV abx as well per ID  Will cont to follow for improvement - suspect will imp with the steroids back on    Flossie DibbleJulie Zyionna Pesce MD

## 2017-02-13 ENCOUNTER — Inpatient Hospital Stay: Admit: 2017-02-13 | Payer: BLUE CROSS/BLUE SHIELD | Primary: Primary Care

## 2017-02-13 ENCOUNTER — Inpatient Hospital Stay: Admit: 2017-02-14 | Payer: BLUE CROSS/BLUE SHIELD | Primary: Primary Care

## 2017-02-13 ENCOUNTER — Inpatient Hospital Stay: Payer: BLUE CROSS/BLUE SHIELD | Primary: Primary Care

## 2017-02-13 LAB — EKG 12-LEAD
Atrial Rate: 51 {beats}/min
P Axis: 72 degrees
P-R Interval: 134 ms
Q-T Interval: 454 ms
QRS Duration: 78 ms
QTc Calculation (Bazett): 418 ms
R Axis: 72 degrees
T Axis: 59 degrees
Ventricular Rate: 51 {beats}/min

## 2017-02-13 LAB — CBC WITH AUTOMATED DIFF
ABS. BASOPHILS: 0 10*3/uL (ref 0.0–0.1)
ABS. EOSINOPHILS: 0 10*3/uL (ref 0.0–0.4)
ABS. IMM. GRANS.: 0.3 10*3/uL — ABNORMAL HIGH (ref 0.00–0.04)
ABS. LYMPHOCYTES: 1.9 10*3/uL (ref 0.8–3.5)
ABS. MONOCYTES: 0.8 10*3/uL (ref 0.0–1.0)
ABS. NEUTROPHILS: 14.3 10*3/uL — ABNORMAL HIGH (ref 1.8–8.0)
ABSOLUTE NRBC: 0 10*3/uL (ref 0.00–0.01)
BASOPHILS: 0 % (ref 0–1)
EOSINOPHILS: 0 % (ref 0–7)
HCT: 35.8 % (ref 35.0–47.0)
HGB: 11.9 g/dL (ref 11.5–16.0)
IMMATURE GRANULOCYTES: 1 % — ABNORMAL HIGH (ref 0.0–0.5)
LYMPHOCYTES: 11 % — ABNORMAL LOW (ref 12–49)
MCH: 32.2 PG (ref 26.0–34.0)
MCHC: 33.2 g/dL (ref 30.0–36.5)
MCV: 97 FL (ref 80.0–99.0)
MONOCYTES: 5 % (ref 5–13)
MPV: 10.4 FL (ref 8.9–12.9)
NEUTROPHILS: 83 % — ABNORMAL HIGH (ref 32–75)
NRBC: 0 PER 100 WBC
PLATELET: 329 10*3/uL (ref 150–400)
RBC: 3.69 M/uL — ABNORMAL LOW (ref 3.80–5.20)
RDW: 11.9 % (ref 11.5–14.5)
WBC: 17.3 10*3/uL — ABNORMAL HIGH (ref 3.6–11.0)

## 2017-02-13 LAB — EKG, 12 LEAD, INITIAL
Atrial Rate: 51 {beats}/min
Calculated P Axis: 72 degrees
Calculated R Axis: 72 degrees
Calculated T Axis: 59 degrees
P-R Interval: 134 ms
Q-T Interval: 454 ms
QRS Duration: 78 ms
QTC Calculation (Bezet): 418 ms
Ventricular Rate: 51 {beats}/min

## 2017-02-13 LAB — METABOLIC PANEL, BASIC
Anion gap: 7 mmol/L (ref 5–15)
BUN/Creatinine ratio: 10 — ABNORMAL LOW (ref 12–20)
BUN: 8 MG/DL (ref 6–20)
CO2: 25 mmol/L (ref 21–32)
Calcium: 8.3 MG/DL — ABNORMAL LOW (ref 8.5–10.1)
Chloride: 106 mmol/L (ref 97–108)
Creatinine: 0.78 MG/DL (ref 0.55–1.02)
GFR est AA: >60 mL/min/{1.73_m2} (ref >60)
GFR est non-AA: >60 mL/min/{1.73_m2} (ref >60)
Glucose: 141 mg/dL — ABNORMAL HIGH (ref 65–100)
Potassium: 3.7 mmol/L (ref 3.5–5.1)
Sodium: 138 mmol/L (ref 136–145)

## 2017-02-13 LAB — POC G3 - PUL
Base excess (POC): 0 mmol/L
HCO3 (POC): 23.6 MMOL/L (ref 22–26)
Total resp. rate: 20
pCO2 (POC): 31.7 MMHG — ABNORMAL LOW (ref 35.0–45.0)
pH (POC): 7.481 — ABNORMAL HIGH (ref 7.35–7.45)
pO2 (POC): 32 MMHG — CL (ref 80–100)
sO2 (POC): 68 % — ABNORMAL LOW (ref 92–97)

## 2017-02-13 LAB — GLUCOSE, POC: Glucose (POC): 126 mg/dL — ABNORMAL HIGH (ref 65–100)

## 2017-02-13 LAB — TROPONIN I: Troponin-I, Qt.: <0.05 ng/mL (ref <0.05)

## 2017-02-13 MED ORDER — IPRATROPIUM-ALBUTEROL 2.5 MG-0.5 MG/3 ML NEB SOLUTION
2.5 mg-0.5 mg/3 ml | Freq: Four times a day (QID) | RESPIRATORY_TRACT | Status: DC | PRN
Start: 2017-02-13 — End: 2017-02-20
  Administered 2017-02-13: 18:00:00 via RESPIRATORY_TRACT

## 2017-02-13 MED ORDER — PHARMACY VANCOMYCIN NOTE
Status: DC
Start: 2017-02-13 — End: 2017-02-15

## 2017-02-13 MED ORDER — SODIUM CHLORIDE 0.9 % IJ SYRG
Freq: Once | INTRAMUSCULAR | Status: AC
Start: 2017-02-13 — End: 2017-02-13
  Administered 2017-02-13: 19:00:00 via INTRAVENOUS

## 2017-02-13 MED ORDER — ONDANSETRON (PF) 4 MG/2 ML INJECTION
4 mg/2 mL | Freq: Three times a day (TID) | INTRAMUSCULAR | Status: DC | PRN
Start: 2017-02-13 — End: 2017-02-20
  Administered 2017-02-13 – 2017-02-19 (×5): via INTRAVENOUS

## 2017-02-13 MED ORDER — SODIUM CHLORIDE 0.9% BOLUS IV
0.9 % | Freq: Once | INTRAVENOUS | Status: AC
Start: 2017-02-13 — End: 2017-02-13
  Administered 2017-02-13: 19:00:00 via INTRAVENOUS

## 2017-02-13 MED ORDER — IOPAMIDOL 76 % IV SOLN
370 mg iodine /mL (76 %) | Freq: Once | INTRAVENOUS | Status: AC
Start: 2017-02-13 — End: 2017-02-13
  Administered 2017-02-13: 19:00:00 via INTRAVENOUS

## 2017-02-13 MED ORDER — LORAZEPAM 2 MG/ML IJ SOLN
2 mg/mL | Freq: Once | INTRAMUSCULAR | Status: AC
Start: 2017-02-13 — End: 2017-02-13
  Administered 2017-02-13: 17:00:00 via INTRAVENOUS

## 2017-02-13 MED ORDER — HYDRALAZINE 20 MG/ML IJ SOLN
20 mg/mL | Freq: Four times a day (QID) | INTRAMUSCULAR | Status: DC | PRN
Start: 2017-02-13 — End: 2017-02-20
  Administered 2017-02-13 – 2017-02-14 (×2): via INTRAVENOUS

## 2017-02-13 MED ORDER — SENNOSIDES-DOCUSATE SODIUM 8.6 MG-50 MG TAB
Freq: Every day | ORAL | Status: DC
Start: 2017-02-13 — End: 2017-02-20
  Administered 2017-02-13 – 2017-02-20 (×8): via ORAL

## 2017-02-13 MED ORDER — LORAZEPAM 2 MG/ML IJ SOLN
2 mg/mL | Freq: Three times a day (TID) | INTRAMUSCULAR | Status: DC | PRN
Start: 2017-02-13 — End: 2017-02-20
  Administered 2017-02-14 – 2017-02-20 (×17): via INTRAVENOUS

## 2017-02-13 MED ORDER — LORAZEPAM 2 MG/ML IJ SOLN
2 mg/mL | Freq: Once | INTRAMUSCULAR | Status: AC
Start: 2017-02-13 — End: 2017-02-13
  Administered 2017-02-13: 18:00:00 via INTRAVENOUS

## 2017-02-13 MED FILL — OXYCODONE 5 MG TAB: 5 mg | ORAL | Qty: 1

## 2017-02-13 MED FILL — NORMAL SALINE FLUSH 0.9 % INJECTION SYRINGE: INTRAMUSCULAR | Qty: 10

## 2017-02-13 MED FILL — HYDROMORPHONE 2 MG/ML INJECTION SOLUTION: 2 mg/mL | INTRAMUSCULAR | Qty: 1

## 2017-02-13 MED FILL — VANCOMYCIN 1,000 MG IV SOLR: 1000 mg | INTRAVENOUS | Qty: 1000

## 2017-02-13 MED FILL — PIPERACILLIN-TAZOBACTAM 3.375 GRAM IV SOLR: 3.375 gram | INTRAVENOUS | Qty: 3.38

## 2017-02-13 MED FILL — AMPHETAMINE-DEXTROAMPHETAMINE 5 MG TAB: 5 mg | ORAL | Qty: 4

## 2017-02-13 MED FILL — SENOKOT-S 8.6 MG-50 MG TABLET: ORAL | Qty: 2

## 2017-02-13 MED FILL — ACETAMINOPHEN 650 MG/20.3 ML ORAL SOLN: 650 mg/20.3 mL | ORAL | Qty: 20.3

## 2017-02-13 MED FILL — DEXAMETHASONE SODIUM PHOSPHATE 4 MG/ML IJ SOLN: 4 mg/mL | INTRAMUSCULAR | Qty: 2

## 2017-02-13 MED FILL — ONDANSETRON (PF) 4 MG/2 ML INJECTION: 4 mg/2 mL | INTRAMUSCULAR | Qty: 2

## 2017-02-13 MED FILL — IPRATROPIUM-ALBUTEROL 2.5 MG-0.5 MG/3 ML NEB SOLUTION: 2.5 mg-0.5 mg/3 ml | RESPIRATORY_TRACT | Qty: 3

## 2017-02-13 MED FILL — ENOXAPARIN 40 MG/0.4 ML SUB-Q SYRINGE: 40 mg/0.4 mL | SUBCUTANEOUS | Qty: 0.4

## 2017-02-13 MED FILL — LORAZEPAM 2 MG/ML IJ SOLN: 2 mg/mL | INTRAMUSCULAR | Qty: 1

## 2017-02-13 MED FILL — HYDRALAZINE 20 MG/ML IJ SOLN: 20 mg/mL | INTRAMUSCULAR | Qty: 1

## 2017-02-13 MED FILL — PHARMACY VANCOMYCIN NOTE: Qty: 1

## 2017-02-13 NOTE — Progress Notes (Signed)
This afternoon,patient complained of shortness of breath,chest pain and felt her throat was closing up. Ordered EKG which showed sinus bradycardia.Troponin was normal.Though ABG was ordered,the results looked like VBG. Ordered ativan - panic attack.IV dilaudid for pain control.Discussed with patient and her wife about the benefits of  CT and risk of  contrast exposure.  Ordered CT chest angio and neck which showed phlegmon in the submental region.Questionable filling defect in subsegmental branch of rt lower lobe pulmonary artery However, given the degree of respiratory motion, definitive right lower lobe pulmonary embolus cannot be confirmed.Based on clinical picture - she is on DVT prophylaxis since admission and this is is a sub segmental filling defect - less likely pulmonary embolism so will not start anticoagulation. She is very anxious,crying intermittently and wants to go home.  All her symptoms likely due to steroid induced hyperactive delirium. Has sinus bradycardia. BP elevated likely due to steroids. She complained of severe headache and left eye pain.CT head no abnormalities.  ??

## 2017-02-13 NOTE — Progress Notes (Signed)
[facility]  Patient had just posted for them and the room is not clean.  Patient is down in CT.  Maralyn SagoSarah will call to let me know assignment for report.

## 2017-02-13 NOTE — Progress Notes (Signed)
Problem: Cellulitis Care Plan (Adult)  Goal: *Control of acute pain  Outcome: Not Progressing Towards Goal  Despite the prescribed medication patient still complains of pain. Will continue to medicate and monitor    Problem: Falls - Risk of  Goal: *Absence of Falls  Document Schmid Fall Risk and appropriate interventions in the flowsheet.  Outcome: Progressing Towards Goal  Fall Risk Interventions:    Bed is in the lowest position and wheels are locked, call bell is within reach, bathroom light is on during evening hours, gripper socks are on and patient has been instructed to call out for assistance if needed.     As of now, patient is free from falls and will continue to be monitored.

## 2017-02-13 NOTE — Progress Notes (Signed)
Bedside shift change report given to Erskine SquibbJane, RN (oncoming nurse) by Hilda LiasMarie, RN (offgoing nurse). Report included the following information SBAR, Kardex, MAR, Accordion, Recent Results and Cardiac Rhythm NSR.

## 2017-02-13 NOTE — Progress Notes (Addendum)
15:36 TRANSFER - IN REPORT:    Verbal report received from BolinasLinda, RN(name) on Christina Stevens  being received from  5 th([phone])(unit) for routine progression of care      Report consisted of patient???s Situation, Background, Assessment and   Recommendations(SBAR).     Information from the following report(s) SBAR, Kardex, MAR, Recent Results and Cardiac Rhythm SB was reviewed with the receiving nurse.    Opportunity for questions and clarification was provided.      Assessment completed upon patient???s arrival to unit and care assumed.     Primary Nurse Rediet Gasper SellsG Taddesse, RN and ian, RN performed a dual skin assessment on this patient No impairment noted      Problem: Falls - Risk of  Goal: *Absence of Falls  Document Schmid Fall Risk and appropriate interventions in the flowsheet.  Outcome: Progressing Towards Goal  Fall Risk Interventions:  Mobility Interventions: Communicate number of staff needed for ambulation/transfer, Mechanical lift, OT consult for ADLs, Patient to call before getting OOB, PT Consult for mobility concerns    Medication Interventions: Assess postural VS orthostatic hypotension, Evaluate medications/consider consulting pharmacy, Patient to call before getting OOB    Elimination Interventions: Call light in reach, Elevated toilet seat, Toilet paper/wipes in reach    Problem: Pressure Injury - Risk of  Goal: *Prevention of pressure injury  Document Braden Scale and appropriate interventions in the flowsheet.  Outcome: Progressing Towards Goal  Pressure Injury Interventions:  Sensory Interventions: Check visual cues for pain, Float heels, Discuss PT/OT consult with provider, Minimize linen layers, Monitor skin under medical devices    Moisture Interventions: Check for incontinence Q2 hours and as needed, Contain wound drainage, Limit adult briefs    Activity Interventions: PT/OT evaluation, Increase time out of bed    Mobility Interventions: Float heels, PT/OT evaluation, Turn and reposition  approx. every two hours(pillow and wedges)    Nutrition Interventions: Document food/fluid/supplement intake, Discuss nutritional consult with provider    Friction and Shear Interventions: Foam dressings/transparent film/skin sealants, Lift sheet, Lift team/patient mobility team      Bedside and Verbal shift change report given to Hilda LiasMarie, RN (oncoming nurse) by Winifred Oliveediet, RN (offgoing nurse). Report included the following information SBAR, Kardex, MAR, Recent Results and Cardiac Rhythm SB.     Visit Vitals  BP 154/78   Pulse (!) 44   Temp 97.8 ??F (36.6 ??C)   Resp 11   Ht 5\' 1"  (1.549 m)   Wt 72.6 kg (160 lb 0.9 oz)   SpO2 100%   Breastfeeding? No   BMI 30.24 kg/m??

## 2017-02-13 NOTE — Progress Notes (Signed)
Patient's Vancomycin was started late due to patient condition and testing.

## 2017-02-13 NOTE — Progress Notes (Signed)
Hospitalist Progress Note  [name], MD  Answering service: [phone] OR [phone] from in house phone        Date of Service:  02/13/2017  NAME:  Christina Stevens  DOB:  [birth year]  MRN:  [id number]      Admission Summary:   37 y.o F with PMH of ADHD,PCOS was admitted due to facial cellulitis.  Patient states that she was diagnosed with mononucleosis at her PCP office and was prescribed amoxicillin and steroids few weeks ago.Later she had cut herself while shaving and she continued to use amoxicillin for nearly 14 days. She also mentioned about going to [facility] ER.  Again,she cut herself again while shaving and since then she noticed swelling,difficult to open her mouth and breathing issues.      Interval history / Subjective:     She complaints of burning sensation in the neck region.Swelling on the left side neck is getting better but swelling below persists.       Assessment & Plan:     #Facial and neck cellulitis:  -she sustained a nick  while shaving her face.  -CT neck  On admission did not show any abscess but USG showed soft tissue swelling  -on IV vanc and zosyn  -ENT and ID following  -CT neck on 02/11/2017 showed fluid collection under chin,per Dr.kerr it is phlegmon and not abscess yet.  -will give  0.2 mg dilaudid Q 8 hr prn and 5 mg roxicodone Q 6 hr prn.  -will continue the steroids to 8 mg Q 8hr.Bowel regimen.  -monitor vanc levels  -will need repeat imaging for evaluation of the submental swelling in 2-3 days.      Leukocytosis:  -steroids vs infection.    Morbid obesity:  Body mass index is 30.24 kg/m??.  -life style changes and weight loss recommended    Episode of agitation:resolved      #History of ADHD:  -on 20 mg adderall BID -confirmed dose from her pharmacy- resumed    Code status:Full  DVT prophylaxis: lovenox    Care Plan discussed with: Patient/Family  Disposition: TBD     Hospital Problems  Date Reviewed: February 21, 2017           Codes Class Noted POA    Facial cellulitis ICD-10-CM: L03.211  ICD-9-CM: 682.0  Feb 21, 2017 Unknown                Review of Systems:   Pertinent items are noted in HPI.       Vital Signs:    Last 24hrs VS reviewed since prior progress note. Most recent are:  Visit Vitals  BP 148/85 (BP 1 Location: Left arm, BP Patient Position: At rest)   Pulse (!) 55   Temp 97.4 ??F (36.3 ??C)   Resp 16   Ht 5\' 1"  (1.549 m)   Wt 72.6 kg (160 lb 0.9 oz)   SpO2 98%   Breastfeeding? No   BMI 30.24 kg/m??       No intake or output data in the 24 hours ending 02/13/17 0753     Physical Examination:             Constitutional:  No acute distress, cooperative, pleasant??   ENT:  Oral mucous moist, no dental caries, swelling in the submental area appears to be more organised,fluctuant.Erythema and swelling decreased     Resp:  CTA bilaterally. No wheezing/rhonchi/rales. No accessory muscle use   CV:  Regular rhythm, normal rate, no murmurs, gallops,  rubs    GI:  Soft, non distended, non tender. normoactive bowel sounds, no hepatosplenomegaly     Musculoskeletal:  No edema, warm, 2+ pulses throughout    Neurologic:  Moves all extremities.  AAOx3            Data Review:    Review and/or order of clinical lab test  Review and/or order of tests in the medicine section of CPT      Labs:     Recent Labs     02/12/17  0503 02/11/17  0227   WBC 16.1* 9.3   HGB 12.0 12.4   HCT 34.6* 37.1   PLT 277 235     Recent Labs     02/13/17  0403 02/12/17  0503 02/11/17  0227   NA 138 139 139   K 3.7 4.2 3.9   CL 106 110* 107   CO2 25 18* 25   BUN 8 6 7    CREA 0.78 0.77 0.82   GLU 141* 148* 112*   CA 8.3* 8.4* 7.7*     No results for input(s): SGOT, GPT, ALT, AP, TBIL, TBILI, TP, ALB, GLOB, GGT, AML, LPSE in the last 72 hours.    No lab exists for component: AMYP, HLPSE  No results for input(s): INR, PTP, APTT in the last 72 hours.    No lab exists for component: INREXT, INREXT   No results for input(s): FE, TIBC, PSAT, FERR in the last 72 hours.    No results found for: FOL, RBCF   No results for input(s): PH, PCO2, PO2 in the last 72 hours.  No results for input(s): CPK, CKNDX, TROIQ in the last 72 hours.    No lab exists for component: CPKMB  No results found for: CHOL, CHOLX, CHLST, CHOLV, HDL, LDL, LDLC, DLDLP, TGLX, TRIGL, TRIGP, CHHD, CHHDX  No results found for: [facility]  Lab Results   Component Value Date/Time    Color YELLOW/STRAW 02/09/2017 07:34 PM    Appearance CLEAR 02/09/2017 07:34 PM    Specific gravity 1.010 02/09/2017 07:34 PM    pH (UA) 6.0 02/09/2017 07:34 PM    Protein NEGATIVE  02/09/2017 07:34 PM    Glucose NEGATIVE  02/09/2017 07:34 PM    Ketone NEGATIVE  02/09/2017 07:34 PM    Bilirubin NEGATIVE  02/09/2017 07:34 PM    Urobilinogen 0.2 02/09/2017 07:34 PM    Nitrites NEGATIVE  02/09/2017 07:34 PM    Leukocyte Esterase NEGATIVE  02/09/2017 07:34 PM    Epithelial cells MODERATE (A) 02/09/2017 07:34 PM    Bacteria NEGATIVE  02/09/2017 07:34 PM    WBC 0-4 02/09/2017 07:34 PM    RBC 0-5 02/09/2017 07:34 PM         Medications Reviewed:     Current Facility-Administered Medications   Medication Dose Route Frequency   ??? dexamethasone (DECADRON) 4 mg/mL injection 8 mg  8 mg IntraVENous Q8H   ??? dextroamphetamine-amphetamine (ADDERALL) tablet 20 mg  20 mg Oral BID   ??? vancomycin (VANCOCIN) 1,000 mg in 0.9% sodium chloride 250 mL (Vial2Bag)  1,000 mg IntraVENous Q8H   ??? HYDROmorphone (DILAUDID) injection 0.2 mg  0.2 mg IntraVENous Q8H PRN   ??? oxyCODONE IR (ROXICODONE) tablet 5 mg  5 mg Oral Q6H PRN   ??? Vancomycin Pharmacy dosing   Other Rx Dosing/Monitoring   ??? acetaminophen (TYLENOL) solution 500 mg  500 mg Oral Q6H   ??? sodium chloride (NS) flush 5-40 mL  5-40 mL IntraVENous  Q8H   ??? sodium chloride (NS) flush 5-40 mL  5-40 mL IntraVENous PRN   ??? enoxaparin (LOVENOX) injection 40 mg  40 mg SubCUTAneous Q24H   ??? 0.9% sodium chloride infusion  75 mL/hr IntraVENous CONTINUOUS   ??? piperacillin-tazobactam (ZOSYN) 3.375 g in 0.9% sodium chloride  (MBP/ADV) 100 mL  3.375 g IntraVENous Q8H     ______________________________________________________________________  EXPECTED LENGTH OF STAY: 3d 7h  ACTUAL LENGTH OF STAY:          4                 Madelaine BhatAnusha Zarif Rathje, MD

## 2017-02-13 NOTE — Progress Notes (Signed)
Nurse Guilan liu gave bedside report to oncoming nurse Linda Anderson RN by SBAR and Kardex

## 2017-02-13 NOTE — Progress Notes (Signed)
Day #4 of Vancomycin  Indication: facial cellulitis  Current regimen: vanc 1g IV q8H  Abx regimen:  vancomycin and zosyn  ID Following ?: YES  Concomitant nephrotoxic drugs (requires more frequent monitoring): None  Frequency of BMP?: daily     Recent Labs     02/13/17  0832 02/13/17  0403 02/12/17  0503 02/11/17  0227   WBC 17.3*  --  16.1* 9.3   CREA  --  0.78 0.77 0.82   BUN  --  8 6 7      Est CrCl: 78.8 ml/min; UO: -- ml/kg/hr  Temp (24hrs), Avg:97.9 ??F (36.6 ??C), Min:97.4 ??F (36.3 ??C), Max:98.7 ??F (37.1 ??C)    Cultures:   1/9 blood - NGx4 days  Urine on hold    Goal trough = 10 - 15 mcg/mL    Recent trough history (date/time/level/dose/action taken):  1/11 @ 1200 = 6.6 mcg/ml on vanc 1g q12h. Switched to vanc 1g q8h    WBC, neutrophils continue to trend up this weekend on broad spectrum antibiotics. Patient remains afebrile.    Plan: Continue current regimen. Level before AM dose tomorrow. Pharmacy will continue to follow daily.     Valentina LucksJessica Williams, [place]  [phone]

## 2017-02-13 NOTE — Progress Notes (Signed)
TRANSFER - OUT REPORT:    Verbal report given to Rediet, RN(name) on Christina Stevens  [facility] change in patient condition(MD ordered)       Report consisted of patient???s Situation, Background, Assessment and   Recommendations(SBAR).     Information from the following report(s) SBAR, Kardex, Procedure Summary, Intake/Output, MAR and Recent Results was reviewed with the receiving nurse.    Lines:   Peripheral IV 02/10/17 Anterior;Lower;Right Arm (Active)   Site Assessment Clean, dry, & intact 02/13/2017  7:23 AM   Phlebitis Assessment 0 02/13/2017  7:23 AM   Infiltration Assessment 0 02/13/2017  7:23 AM   Dressing Status Clean, dry, & intact 02/13/2017  7:23 AM   Dressing Type Transparent 02/13/2017  7:23 AM   Hub Color/Line Status Infusing 02/13/2017  7:23 AM   Action Taken Open ports on tubing capped 02/11/2017  8:00 PM   Alcohol Cap Used Yes 02/13/2017  7:23 AM       Peripheral IV 02/13/17 Anterior;Right Antecubital (Active)   Site Assessment Clean, dry, & intact 02/13/2017  2:03 PM   Phlebitis Assessment 0 02/13/2017  2:03 PM   Infiltration Assessment 0 02/13/2017  2:03 PM   Dressing Status Clean, dry, & intact 02/13/2017  2:03 PM   Dressing Type Transparent 02/13/2017  2:03 PM   Hub Color/Line Status Capped 02/13/2017  2:03 PM   Alcohol Cap Used Yes 02/13/2017  2:03 PM        Opportunity for questions and clarification was provided.      Patient transported with:   O2 @ 1 liters  Registered Nurse

## 2017-02-13 NOTE — Progress Notes (Signed)
Patient reporting tight chest and a little trouble breathing.  Assessed.  Lung sounds clear, O2 sats = 98 on RA.  Dr. Lin GivensKanagala is aware. Per Dr. Lin GivensKanagala, steroids could cause panic attacks.  Put on CARE channel and told patient to relax and take slow deep breaths.  Patient resting comfortably.

## 2017-02-14 LAB — ECHOCARDIOGRAM COMPLETE 2D W DOPPLER W COLOR: Left Ventricular Ejection Fraction: 58

## 2017-02-14 LAB — VANCOMYCIN, TROUGH: Vancomycin,trough: 17.7 ug/mL — ABNORMAL HIGH (ref 5.0–10.0)

## 2017-02-14 LAB — CULTURE, BLOOD, PAIRED: Culture result:: NO GROWTH

## 2017-02-14 LAB — METABOLIC PANEL, BASIC
Anion gap: 8 mmol/L (ref 5–15)
BUN/Creatinine ratio: 13 (ref 12–20)
BUN: 11 MG/DL (ref 6–20)
CO2: 24 mmol/L (ref 21–32)
Calcium: 7.9 MG/DL — ABNORMAL LOW (ref 8.5–10.1)
Chloride: 107 mmol/L (ref 97–108)
Creatinine: 0.82 MG/DL (ref 0.55–1.02)
GFR est AA: >60 mL/min/{1.73_m2} (ref >60)
GFR est non-AA: >60 mL/min/{1.73_m2} (ref >60)
Glucose: 99 mg/dL (ref 65–100)
Potassium: 3.7 mmol/L (ref 3.5–5.1)
Sodium: 139 mmol/L (ref 136–145)

## 2017-02-14 LAB — MAGNESIUM: Magnesium: 2.1 mg/dL (ref 1.6–2.4)

## 2017-02-14 LAB — TROPONIN I: Troponin-I, Qt.: <0.05 ng/mL (ref <0.05)

## 2017-02-14 MED ORDER — PREDNISONE 20 MG TAB
20 mg | Freq: Once | ORAL | Status: AC
Start: 2017-02-14 — End: 2017-02-14
  Administered 2017-02-14: via ORAL

## 2017-02-14 MED ORDER — HYDROMORPHONE 2 MG/ML INJECTION SOLUTION
2 mg/mL | Freq: Four times a day (QID) | INTRAMUSCULAR | Status: DC | PRN
Start: 2017-02-14 — End: 2017-02-15
  Administered 2017-02-14 – 2017-02-15 (×3): via INTRAVENOUS

## 2017-02-14 MED ORDER — SODIUM CHLORIDE 0.9 % IV
750 mg | Freq: Three times a day (TID) | INTRAVENOUS | Status: DC
Start: 2017-02-14 — End: 2017-02-20
  Administered 2017-02-14 – 2017-02-20 (×18): via INTRAVENOUS

## 2017-02-14 MED FILL — OXYCODONE 5 MG TAB: 5 mg | ORAL | Qty: 1

## 2017-02-14 MED FILL — ACETAMINOPHEN 650 MG/20.3 ML ORAL SOLN: 650 mg/20.3 mL | ORAL | Qty: 20.3

## 2017-02-14 MED FILL — HYDRALAZINE 20 MG/ML IJ SOLN: 20 mg/mL | INTRAMUSCULAR | Qty: 1

## 2017-02-14 MED FILL — LORAZEPAM 2 MG/ML IJ SOLN: 2 mg/mL | INTRAMUSCULAR | Qty: 1

## 2017-02-14 MED FILL — HYDROMORPHONE 2 MG/ML INJECTION SOLUTION: 2 mg/mL | INTRAMUSCULAR | Qty: 1

## 2017-02-14 MED FILL — PIPERACILLIN-TAZOBACTAM 3.375 GRAM IV SOLR: 3.375 gram | INTRAVENOUS | Qty: 3.38

## 2017-02-14 MED FILL — VANCOMYCIN 1,000 MG IV SOLR: 1000 mg | INTRAVENOUS | Qty: 1000

## 2017-02-14 MED FILL — NORMAL SALINE FLUSH 0.9 % INJECTION SYRINGE: INTRAMUSCULAR | Qty: 10

## 2017-02-14 MED FILL — PREDNISONE 20 MG TAB: 20 mg | ORAL | Qty: 1

## 2017-02-14 MED FILL — ENOXAPARIN 40 MG/0.4 ML SUB-Q SYRINGE: 40 mg/0.4 mL | SUBCUTANEOUS | Qty: 0.4

## 2017-02-14 MED FILL — AMPHETAMINE-DEXTROAMPHETAMINE 10 MG TAB: 10 mg | ORAL | Qty: 2

## 2017-02-14 MED FILL — ONDANSETRON (PF) 4 MG/2 ML INJECTION: 4 mg/2 mL | INTRAMUSCULAR | Qty: 2

## 2017-02-14 MED FILL — VANCOMYCIN 750 MG IV SOLUTION: 750 mg | INTRAVENOUS | Qty: 750

## 2017-02-14 MED FILL — SENOKOT-S 8.6 MG-50 MG TABLET: ORAL | Qty: 2

## 2017-02-14 NOTE — Consults (Signed)
CARDIOLOGY CONSULT NOTE     [facility], [address], [place] [address]   [phone] fax [phone]    Name: Christina Stevens  [birth year] [id number]  02/14/2017 10:55 AM     Assessment/Plan:      1.  Facial cellulitis - management per IM/ENT/ID  2.  Sinus bradycardia - in the setting of pain and nausea/vomiting it is likely related to high vagal tone  -will consider exercise treadmill to assess for chronotropic incompetence if symptomatic bradycardia persists  -will check TSH/T4 and get a TTE to assess cardiac structure and function   3.  Labile blood pressure - continue to monitor     Fam Hx: no early CAD  Soc Hx: no etoh use, smokes 1/2 ppd     Admit Date: 02/09/2017     Admit Diagnosis: Facial cellulitis [L03.211]  Primary Care Physician:Coggin, Nadara Mode, NP     Attending Provider: Sena Slate, MD  Primary Cardiologist: None  Consulting Cardiologist: Dr. Ninfa Linden     REASON FOR CONSULT: sinus bradycardia  Requesting Physician: Dr. Waynard Edwards    Subjective:     Christina Stevens is a 37 y.o. female admitted for facial cellulitis.  We were asked to see patient regarding her sinus bradycardia.  She felt well prior to admission, played soccer, coaches basketball, boxes.  She denied any dizziness or syncope prior to admission.  Now she is having some dizziness with standing up.  She denies any chest pain or dyspnea prior to admission.  Overnight she had some chest pain and dyspnea which was attributed to an anxiety attack.  She is very upset and anxious about her admission.  She reports feeling perfectly fine prior to admission except for her recent mononucleosis.  No hx of cardiac issues or testing.  She reports nausea and vomiting since admission and is in a lot of pain related to her facial cellulitis.       Review of Symptoms:  A comprehensive review of systems was negative except for that written in the HPI.    Previous treatment/evaluation includes none  .  Cardiac risk factors: smoking/ tobacco exposure.    Past Medical History:   Diagnosis Date   ??? ADHD    ??? PCOS (polycystic ovarian syndrome)      Past Surgical History:   Procedure Laterality Date   ??? HX APPENDECTOMY     ??? IR CHOLECYSTOSTOMY PERCUTANEOUS       Current Facility-Administered Medications   Medication Dose Route Frequency   ??? vancomycin (VANCOCIN) 750 mg in 0.9% sodium chloride 250 mL (Vial2Bag)  750 mg IntraVENous Q8H   ??? senna-docusate (PERICOLACE) 8.6-50 mg per tablet 2 Tab  2 Tab Oral DAILY   ??? vancomycin trough 1/14 @ 0500 prior to dose due same time   Other Rx Dosing/Monitoring   ??? albuterol-ipratropium (DUO-NEB) 2.5 MG-0.5 MG/3 ML  3 mL Nebulization Q6H PRN   ??? ondansetron (ZOFRAN) injection 4 mg  4 mg IntraVENous Q8H PRN   ??? hydrALAZINE (APRESOLINE) 20 mg/mL injection 5 mg  5 mg IntraVENous Q6H PRN   ??? LORazepam (ATIVAN) injection 0.5 mg  0.5 mg IntraVENous Q8H PRN   ??? dextroamphetamine-amphetamine (ADDERALL) tablet 20 mg  20 mg Oral BID   ??? HYDROmorphone (DILAUDID) injection 0.2 mg  0.2 mg IntraVENous Q8H PRN   ??? oxyCODONE IR (ROXICODONE) tablet 5 mg  5 mg Oral Q6H PRN   ??? Vancomycin Pharmacy  dosing   Other Rx Dosing/Monitoring   ??? acetaminophen (TYLENOL) solution 500 mg  500 mg Oral Q6H   ??? sodium chloride (NS) flush 5-40 mL  5-40 mL IntraVENous Q8H   ??? sodium chloride (NS) flush 5-40 mL  5-40 mL IntraVENous PRN   ??? enoxaparin (LOVENOX) injection 40 mg  40 mg SubCUTAneous Q24H   ??? 0.9% sodium chloride infusion  75 mL/hr IntraVENous CONTINUOUS   ??? piperacillin-tazobactam (ZOSYN) 3.375 g in 0.9% sodium chloride (MBP/ADV) 100 mL  3.375 g IntraVENous Q8H       No Known Allergies   History reviewed. No pertinent family history.   Social History     Socioeconomic History   ??? Marital status: MARRIED     Spouse name: Not on file   ??? Number of children: Not on file   ??? Years of education: Not on file   ??? Highest education level: Not on file   Tobacco Use   ??? Smoking status: Current Every Day Smoker    ??? Smokeless tobacco: Never Used   Substance and Sexual Activity   ??? Alcohol use: No     Frequency: Never   ??? Drug use: Yes     Types: Marijuana          Objective:      Physical Exam  Vitals:    02/14/17 0403 02/14/17 0416 02/14/17 0809 02/14/17 1114   BP: 151/82  157/88 167/90   Pulse: (!) 44  (!) 42 (!) 49   Resp: _0 Temp: 97.4 ??F (36.3 ??C)  97.6 ??F (36.4 ??C) 98.5 ??F (36.9 ??C)   SpO2: 95%  97% 95%   Weight:  166 lb 10.7 oz (75.6 kg)     Height:           General:  Alert, cooperative, no distress, appears stated age.   Eyes:  Conjunctivae/corneas clear.     Ears:  Normal external ear canals both ears.   Nose: Nares normal.     Mouth/Throat: Moist mucous membranes.     Neck: Supple, symmetrical, trachea midline, no carotid bruit and no JVD.   Back:   Symmetric, no curvature. ROM normal.    Lungs:   Clear to auscultation bilaterally.   Heart:  Bradycardic, regular rhythm, S1, S2 normal, no murmur, click, rub or gallop.   Abdomen:   Soft, non-tender. Bowel sounds normal.    Extremities: Extremities normal, atraumatic, no cyanosis or edema.   Vascular: 2+ and symmetric all extremities.   Skin: Skin color normal. No rashes or lesions   Lymph nodes: Not assessed   Neurologic: CNII-XII intact. Normal strength throughout.       Telemetry: sinus bradycardia VR 50s  ECG: sinus bradycardia VR 51bpm    Data Review:     Recent Labs     02/13/17  1950 02/13/17  1248   TROIQ <0.05 <0.05     Recent Labs     02/14/17  0428 02/13/17  0403   NA 139 138   K 3.7 3.7   CL 107 106   CO2 24 25   BUN 11 8   CREA 0.82 0.78   GLU 99 141*   CA 7.9* 8.3*     Recent Labs     02/13/17  0832 02/12/17  0503   WBC 17.3* 16.1*   HGB 11.9 12.0   HCT 35.8 34.6*   PLT 329 277     No results for input(s):  PTP, INR, GPT, SGOT, AP in the last 72 hours.    No lab exists for component: PTTP, INREXT, INREXT  No results for input(s): CHOL, LDLC in the last 72 hours.    No lab exists for component: TGL, HDLC,  HBA1C  No results for input(s): CRP,  TSH, TSHEXT, TSHEXT in the last 72 hours.    No lab exists for component: ESR  Thank you very much for this referral. I appreciate the opportunity to participate in this patient's care. I will follow along with above stated plan.    Telford Nab, MD  [facility], [address]  [place], [place]  [phone]    PX:[id number], Nadara Mode, NP

## 2017-02-14 NOTE — Progress Notes (Signed)
Problem: Falls - Risk of  Goal: *Absence of Falls  Document Schmid Fall Risk and appropriate interventions in the flowsheet.  Outcome: Progressing Towards Goal  Fall Risk Interventions:  Mobility Interventions: Patient to call before getting OOB         Medication Interventions: Evaluate medications/consider consulting pharmacy, Patient to call before getting OOB, Teach patient to arise slowly    Elimination Interventions: Call light in reach, Patient to call for help with toileting needs             Problem: Pressure Injury - Risk of  Goal: *Prevention of pressure injury  Document Braden Scale and appropriate interventions in the flowsheet.  Outcome: Progressing Towards Goal  Pressure Injury Interventions:  Sensory Interventions: Assess changes in LOC, Keep linens dry and wrinkle-free    Moisture Interventions: Absorbent underpads    Activity Interventions: Increase time out of bed, Pressure redistribution bed/mattress(bed type)    Mobility Interventions: HOB 30 degrees or less, Pressure redistribution bed/mattress (bed type)    Nutrition Interventions: Document food/fluid/supplement intake, Offer support with meals,snacks and hydration    Friction and Shear Interventions: Lift sheet

## 2017-02-14 NOTE — Progress Notes (Addendum)
0345: Pt refusing labs, states she was promised she would not need anymore labs during her stay and threatening to leave AMA    0430: Pt agreed to lab draw    Pt requests speaking with patient advocacy, RN called and left a message for them to call back/that the patient would like to speak with a representative    Bedside and Verbal shift change report given to Zollie Scalelivia RN (oncoming nurse) by Erskine SquibbJane RN (offgoing nurse). Report included the following information SBAR, Kardex, Procedure Summary, Intake/Output, MAR and Recent Results.

## 2017-02-14 NOTE — Consults (Addendum)
CARDIOLOGY CONSULT NOTE     [facility], [address], [place] [address]   [phone] fax [phone]    Name: Christina Stevens  [birth year] [id number]  02/14/2017 10:55 AM     Assessment/Plan:      1.  Facial cellulitis - management per IM/ENT/ID  2.  Sinus bradycardia - in the setting of pain and nausea/vomiting it is likely related to high vagal tone  -will consider exercise treadmill to assess for chronotropic incompetence if symptomatic bradycardia persists  -will check TSH/T4 and get a TTE to assess cardiac structure and function   3.  Labile blood pressure - continue to monitor     Fam Hx: no early CAD  Soc Hx: no etoh use, smokes 1/2 ppd     Admit Date: 02/09/2017     Admit Diagnosis: Facial cellulitis [L03.211]  Primary Care Physician:Coggin, Nadara Mode, NP     Attending Provider: Sena Slate, MD  Primary Cardiologist: None  Consulting Cardiologist: Dr. Ninfa Linden     REASON FOR CONSULT: sinus bradycardia  Requesting Physician: Dr. Waynard Edwards    Subjective:     Christina Stevens is a 37 y.o. female admitted for facial cellulitis.  We were asked to see patient regarding her sinus bradycardia.  She felt well prior to admission, played soccer, coaches basketball, boxes.  She denied any dizziness or syncope prior to admission.  Now she is having some dizziness with standing up.  She denies any chest pain or dyspnea prior to admission.  Overnight she had some chest pain and dyspnea which was attributed to an anxiety attack.  She is very upset and anxious about her admission.  She reports feeling perfectly fine prior to admission except for her recent mononucleosis.  No hx of cardiac issues or testing.  She reports nausea and vomiting since admission and is in a lot of pain related to her facial cellulitis.       Review of Symptoms:  A comprehensive review of systems was negative except for that written in the HPI.    Previous treatment/evaluation includes none .   Cardiac risk factors: smoking/ tobacco exposure.    Past Medical History:   Diagnosis Date   ??? ADHD    ??? PCOS (polycystic ovarian syndrome)      Past Surgical History:   Procedure Laterality Date   ??? HX APPENDECTOMY     ??? IR CHOLECYSTOSTOMY PERCUTANEOUS       Current Facility-Administered Medications   Medication Dose Route Frequency   ??? vancomycin (VANCOCIN) 750 mg in 0.9% sodium chloride 250 mL (Vial2Bag)  750 mg IntraVENous Q8H   ??? senna-docusate (PERICOLACE) 8.6-50 mg per tablet 2 Tab  2 Tab Oral DAILY   ??? vancomycin trough 1/14 @ 0500 prior to dose due same time   Other Rx Dosing/Monitoring   ??? albuterol-ipratropium (DUO-NEB) 2.5 MG-0.5 MG/3 ML  3 mL Nebulization Q6H PRN   ??? ondansetron (ZOFRAN) injection 4 mg  4 mg IntraVENous Q8H PRN   ??? hydrALAZINE (APRESOLINE) 20 mg/mL injection 5 mg  5 mg IntraVENous Q6H PRN   ??? LORazepam (ATIVAN) injection 0.5 mg  0.5 mg IntraVENous Q8H PRN   ??? dextroamphetamine-amphetamine (ADDERALL) tablet 20 mg  20 mg Oral BID   ??? HYDROmorphone (DILAUDID) injection 0.2 mg  0.2 mg IntraVENous Q8H PRN   ??? oxyCODONE IR (ROXICODONE) tablet 5 mg  5 mg Oral Q6H PRN   ??? Vancomycin Pharmacy  dosing   Other Rx Dosing/Monitoring   ??? acetaminophen (TYLENOL) solution 500 mg  500 mg Oral Q6H   ??? sodium chloride (NS) flush 5-40 mL  5-40 mL IntraVENous Q8H   ??? sodium chloride (NS) flush 5-40 mL  5-40 mL IntraVENous PRN   ??? enoxaparin (LOVENOX) injection 40 mg  40 mg SubCUTAneous Q24H   ??? 0.9% sodium chloride infusion  75 mL/hr IntraVENous CONTINUOUS   ??? piperacillin-tazobactam (ZOSYN) 3.375 g in 0.9% sodium chloride (MBP/ADV) 100 mL  3.375 g IntraVENous Q8H       No Known Allergies   History reviewed. No pertinent family history.   Social History     Socioeconomic History   ??? Marital status: MARRIED     Spouse name: Not on file   ??? Number of children: Not on file   ??? Years of education: Not on file   ??? Highest education level: Not on file   Tobacco Use   ??? Smoking status: Current Every Day Smoker    ??? Smokeless tobacco: Never Used   Substance and Sexual Activity   ??? Alcohol use: No     Frequency: Never   ??? Drug use: Yes     Types: Marijuana          Objective:      Physical Exam  Vitals:    02/14/17 0403 02/14/17 0416 02/14/17 0809 02/14/17 1114   BP: 151/82  157/88 167/90   Pulse: (!) 44  (!) 42 (!) 49   Resp: _0 Temp: 97.4 ??F (36.3 ??C)  97.6 ??F (36.4 ??C) 98.5 ??F (36.9 ??C)   SpO2: 95%  97% 95%   Weight:  166 lb 10.7 oz (75.6 kg)     Height:           General:  Alert, cooperative, no distress, appears stated age.   Eyes:  Conjunctivae/corneas clear.     Ears:  Normal external ear canals both ears.   Nose: Nares normal.     Mouth/Throat: Moist mucous membranes.     Neck: Supple, symmetrical, trachea midline, no carotid bruit and no JVD.   Back:   Symmetric, no curvature. ROM normal.    Lungs:   Clear to auscultation bilaterally.   Heart:  Bradycardic, regular rhythm, S1, S2 normal, no murmur, click, rub or gallop.   Abdomen:   Soft, non-tender. Bowel sounds normal.    Extremities: Extremities normal, atraumatic, no cyanosis or edema.   Vascular: 2+ and symmetric all extremities.   Skin: Skin color normal. No rashes or lesions   Lymph nodes: Not assessed   Neurologic: CNII-XII intact. Normal strength throughout.       Telemetry: sinus bradycardia VR 50s  ECG: sinus bradycardia VR 51bpm    Data Review:     Recent Labs     02/13/17  1950 02/13/17  1248   TROIQ <0.05 <0.05     Recent Labs     02/14/17  0428 02/13/17  0403   NA 139 138   K 3.7 3.7   CL 107 106   CO2 24 25   BUN 11 8   CREA 0.82 0.78   GLU 99 141*   CA 7.9* 8.3*     Recent Labs     02/13/17  0832 02/12/17  0503   WBC 17.3* 16.1*   HGB 11.9 12.0   HCT 35.8 34.6*   PLT 329 277     No results for input(s):  PTP, INR, GPT, SGOT, AP in the last 72 hours.    No lab exists for component: PTTP, INREXT, INREXT  No results for input(s): CHOL, LDLC in the last 72 hours.    No lab exists for component: TGL, HDLC,  HBA1C   No results for input(s): CRP, TSH, TSHEXT, TSHEXT in the last 72 hours.    No lab exists for component: ESR  Thank you very much for this referral. I appreciate the opportunity to participate in this patient's care. I will follow along with above stated plan.    Telford Nab, MD  [facility], [address], [place]  [phone]    MH:[id number], Nadara Mode, NP

## 2017-02-14 NOTE — Progress Notes (Signed)
ID Progress Note  02/14/2017    Subjective:     Afebrile. Still has submental pain.     Objective:     Vitals:   Visit Vitals  BP 167/90 (BP 1 Location: Left arm, BP Patient Position: At rest)   Pulse (!) 49   Temp 98.5 ??F (36.9 ??C)   Resp 14   Ht 5' 1"  (1.549 m)   Wt 75.6 kg (166 lb 10.7 oz)   SpO2 95%   Breastfeeding? No   BMI 31.49 kg/m??        Tmax:  Temp (24hrs), Avg:98 ??F (36.7 ??C), Min:97.4 ??F (36.3 ??C), Max:98.5 ??F (36.9 ??C)      Exam:    Not in distress  Lungs: clear to auscultation  Heart: s1, s2, no murmurs   Abdomen: soft, nontender  Has some submental swelling which is tender to touch       Labs:   Lab Results   Component Value Date/Time    WBC 17.3 (H) 02/13/2017 08:32 AM    HGB 11.9 02/13/2017 08:32 AM    HCT 35.8 02/13/2017 08:32 AM    PLATELET 329 02/13/2017 08:32 AM    MCV 97.0 02/13/2017 08:32 AM     Lab Results   Component Value Date/Time    Sodium 139 02/14/2017 04:28 AM    Potassium 3.7 02/14/2017 04:28 AM    Chloride 107 02/14/2017 04:28 AM    CO2 24 02/14/2017 04:28 AM    Anion gap 8 02/14/2017 04:28 AM    Glucose 99 02/14/2017 04:28 AM    BUN 11 02/14/2017 04:28 AM    Creatinine 0.82 02/14/2017 04:28 AM    BUN/Creatinine ratio 13 02/14/2017 04:28 AM    GFR est AA >60 02/14/2017 04:28 AM    GFR est non-AA >60 02/14/2017 04:28 AM    Calcium 7.9 (L) 02/14/2017 04:28 AM    Bilirubin, total 0.3 02/10/2017 02:04 AM    AST (SGOT) 12 (L) 02/10/2017 02:04 AM    Alk. phosphatase 55 02/10/2017 02:04 AM    Protein, total 6.4 02/10/2017 02:04 AM    Albumin 3.0 (L) 02/10/2017 02:04 AM    Globulin 3.4 02/10/2017 02:04 AM    A-G Ratio 0.9 (L) 02/10/2017 02:04 AM    ALT (SGPT) 16 02/10/2017 02:04 AM           Assessment:     1.  Submental phlegmon   2.  Attention deficit hyperactivity disorder.  3.  Polycystic ovarian syndrome.   ??        Recommendations:       She looks better than last Friday night (She was crying in pain then). She looks a lot more comfortable.      WBC up, but this may be from steroids. Her last dose was at 6 am yesterday. Her cbc should be repeated tomorrow.       Keysha Damewood Sallyanne Havers, MD

## 2017-02-14 NOTE — Progress Notes (Addendum)
Hospitalist Progress Note  [name], MD  Answering service: [phone] OR [phone] from in house phone        Date of Service:  02/14/2017  NAME:  Christina Stevens  DOB:  Nov 09, 1980  MRN:  [id number]      Admission Summary:   37 y.o F with PMH of ADHD,PCOS was admitted due to facial cellulitis.  Patient states that she was diagnosed with mononucleosis at her PCP office and was prescribed amoxicillin and steroids few weeks ago.Later she had cut herself while shaving and she continued to use amoxicillin for nearly 14 days. She also mentioned about going to [facility].  Again,she cut herself again while shaving and since then she noticed swelling,difficult to open her mouth and breathing issues.      Interval history / Subjective:   Seen patient along with patient advocacy.    Patient was frustrated with 5 day hospitalization,she expected that she would recover quickly and return home. She states that the swelling has decreased but the pain below the chin persists due to swelling.  Also has intermittent headache and left eye pain lasting 1-2 minutes.She was worried about her low heart rate as it never happened before.      Assessment & Plan:     #Facial and neck cellulitis:  -she sustained a nick  while shaving her face.  -CT neck on admission did not show any abscess but USG showed soft tissue swelling  -on IV vanc and zosyn  -ENT and ID following  -CT neck on 02/11/2017 showed fluid collection under chin,per Dr.kerr it is phlegmon and not abscess yet.  -CT on 1/13 showed non drain able fluid collection in the submental region,reactive lymph nodes.  -As she had symptoms of hyperactive delirium(was very anxious,chest discomfort,hyperventilation,was crying intermittently) while on IV decadron 8 mg Q 8hr it was stopped on 1/13.  Will give low dose prednisone 20 mg once today and need to wean off next few days.   -will increase frequency to 0.2 mg dilaudid Q 6 hr prn and continue 5 mg roxicodone Q 6 hr prn.  -monitor vanc levels    #Sinus bradycardia:  -as she was symptomatic with lightheadedness -consulted cardiology  -will follow echo and TSH results.    #Elevated blood pressure:improving  -she does not have a diagnosis of HTN.It is likely due to steroids  -prn hydralazine if symptomatic.      Leukocytosis:  -likely due to steroids as cellulitis is improving.    Morbid obesity:  Body mass index is 31.45 kg/m??.  -life style changes and weight loss recommended      #History of ADHD:  -on 20 mg adderall BID -confirmed dose from her pharmacy- resumed    As she had chest pain and resp distress on 1/13 CT angio chest done which showed questionable filling defect  in the subsegmental branch of the right lower lobe pulmonary artery. However, given the degree of respiratory motion, definitive right lower lobe pulmonary embolus cannot be confirmed. She has been on lovenox since admission, it a subsegmental filling defect  -I  think she does not need therapeutic anticoagulation. Her resp distress is attributed to anxiety due to steroids.    Code status:Full  DVT prophylaxis: lovenox    Care Plan discussed with patient and Dr.Kerr.  Explained to the patient that she is improving and there is no abscess to drain or culture currently.Explained the necessity of lab work up and blood draws.I have answered all her  questions.    Disposition: TBD     Hospital Problems  Date Reviewed: 02/09/2017          Codes Class Noted POA    Facial cellulitis ICD-10-CM: L03.211  ICD-9-CM: 682.0  02/09/2017 Unknown                Review of Systems:   Pertinent items are noted in HPI.       Vital Signs:    Last 24hrs VS reviewed since prior progress note. Most recent are:  Visit Vitals  BP 107/62 (BP 1 Location: Left arm, BP Patient Position: At rest)   Pulse (!) 50   Temp 98.6 ??F (37 ??C)   Resp 17   Ht 5\' 1"  (1.549 m)   Wt 75.5 kg (166 lb 7.2 oz)   SpO2 97%    Breastfeeding? No   BMI 31.45 kg/m??         Intake/Output Summary (Last 24 hours) at 02/14/2017 2052  Last data filed at 02/14/2017 2001  Gross per 24 hour   Intake 1380 ml   Output 650 ml   Net 730 ml        Physical Examination:             Constitutional:  No acute distress, cooperative, pleasant??   ENT:  Oral mucous moist, no dental caries, swelling in the submental areas decreased,has skin abrasions. Erythema and swelling on the neck has decreased     Resp:  CTA bilaterally. No wheezing/rhonchi/rales. No accessory muscle use   CV:  Regular rhythm, normal rate, no murmurs, gallops, rubs    GI:  Soft, non distended, non tender. normoactive bowel sounds, no hepatosplenomegaly     Musculoskeletal:  No edema, warm, 2+ pulses throughout    Neurologic:  Moves all extremities.  AAOx3            Data Review:    Review and/or order of clinical lab test  Review and/or order of tests in the medicine section of CPT      Labs:     Recent Labs     02/13/17  0832 02/12/17  0503   WBC 17.3* 16.1*   HGB 11.9 12.0   HCT 35.8 34.6*   PLT 329 277     Recent Labs     02/14/17  0428 02/13/17  0403 02/12/17  0503   NA 139 138 139   K 3.7 3.7 4.2   CL 107 106 110*   CO2 24 25 18*   BUN 11 8 6    CREA 0.82 0.78 0.77   GLU 99 141* 148*   CA 7.9* 8.3* 8.4*   MG 2.1  --   --      No results for input(s): SGOT, GPT, ALT, AP, TBIL, TBILI, TP, ALB, GLOB, GGT, AML, LPSE in the last 72 hours.    No lab exists for component: AMYP, HLPSE  No results for input(s): INR, PTP, APTT in the last 72 hours.    No lab exists for component: INREXT, INREXT   No results for input(s): FE, TIBC, PSAT, FERR in the last 72 hours.   No results found for: FOL, RBCF   No results for input(s): PH, PCO2, PO2 in the last 72 hours.  Recent Labs     02/13/17  1950 02/13/17  1248   TROIQ <0.05 <0.05     No results found for: CHOL, CHOLX, CHLST, CHOLV, HDL, LDL, LDLC, DLDLP, TGLX, TRIGL, TRIGP, CHHD, CHHDX  Lab Results   Component Value Date/Time     Glucose (POC) 126 (H) 02/13/2017 03:57 PM     Lab Results   Component Value Date/Time    Color YELLOW/STRAW 02/09/2017 07:34 PM    Appearance CLEAR 02/09/2017 07:34 PM    Specific gravity 1.010 02/09/2017 07:34 PM    pH (UA) 6.0 02/09/2017 07:34 PM    Protein NEGATIVE  02/09/2017 07:34 PM    Glucose NEGATIVE  02/09/2017 07:34 PM    Ketone NEGATIVE  02/09/2017 07:34 PM    Bilirubin NEGATIVE  02/09/2017 07:34 PM    Urobilinogen 0.2 02/09/2017 07:34 PM    Nitrites NEGATIVE  02/09/2017 07:34 PM    Leukocyte Esterase NEGATIVE  02/09/2017 07:34 PM    Epithelial cells MODERATE (A) 02/09/2017 07:34 PM    Bacteria NEGATIVE  02/09/2017 07:34 PM    WBC 0-4 02/09/2017 07:34 PM    RBC 0-5 02/09/2017 07:34 PM         Medications Reviewed:     Current Facility-Administered Medications   Medication Dose Route Frequency   ??? vancomycin (VANCOCIN) 750 mg in 0.9% sodium chloride 250 mL (Vial2Bag)  750 mg IntraVENous Q8H   ??? HYDROmorphone (DILAUDID) injection 0.2 mg  0.2 mg IntraVENous Q6H PRN   ??? senna-docusate (PERICOLACE) 8.6-50 mg per tablet 2 Tab  2 Tab Oral DAILY   ??? vancomycin trough 1/14 @ 0500 prior to dose due same time   Other Rx Dosing/Monitoring   ??? albuterol-ipratropium (DUO-NEB) 2.5 MG-0.5 MG/3 ML  3 mL Nebulization Q6H PRN   ??? ondansetron (ZOFRAN) injection 4 mg  4 mg IntraVENous Q8H PRN   ??? hydrALAZINE (APRESOLINE) 20 mg/mL injection 5 mg  5 mg IntraVENous Q6H PRN   ??? LORazepam (ATIVAN) injection 0.5 mg  0.5 mg IntraVENous Q8H PRN   ??? dextroamphetamine-amphetamine (ADDERALL) tablet 20 mg  20 mg Oral BID   ??? oxyCODONE IR (ROXICODONE) tablet 5 mg  5 mg Oral Q6H PRN   ??? Vancomycin Pharmacy dosing   Other Rx Dosing/Monitoring   ??? acetaminophen (TYLENOL) solution 500 mg  500 mg Oral Q6H   ??? sodium chloride (NS) flush 5-40 mL  5-40 mL IntraVENous Q8H   ??? sodium chloride (NS) flush 5-40 mL  5-40 mL IntraVENous PRN   ??? enoxaparin (LOVENOX) injection 40 mg  40 mg SubCUTAneous Q24H    ??? 0.9% sodium chloride infusion  75 mL/hr IntraVENous CONTINUOUS   ??? piperacillin-tazobactam (ZOSYN) 3.375 g in 0.9% sodium chloride (MBP/ADV) 100 mL  3.375 g IntraVENous Q8H     ______________________________________________________________________  EXPECTED LENGTH OF STAY: 3d 7h  ACTUAL LENGTH OF STAY:          5                 [name], MD

## 2017-02-14 NOTE — Progress Notes (Signed)
1700: [facility].    At rest: HR- 45, SpO2- 99% on room air    Walking in hallway: HR between 52-64, SpO2- 96-99%

## 2017-02-14 NOTE — Progress Notes (Addendum)
1930: Bedside shift change report given to Erskine SquibbJane, RN (oncoming nurse) by Zollie Scalelivia, RN (offgoing nurse). Report included the following information SBAR, Kardex, Intake/Output, MAR, Recent Results and Cardiac Rhythm SB.     Problem: Cellulitis Care Plan (Adult)  Goal: *Control of acute pain  Outcome: Not Progressing Towards Goal  Pt receiving PRN oxycodone and dilaudid. Dilaudid changed from Q8 to Q6. Will continue to monitor    Problem: Falls - Risk of  Goal: *Absence of Falls  Document Schmid Fall Risk and appropriate interventions in the flowsheet.  Outcome: Progressing Towards Goal  Fall Risk Interventions:  Mobility Interventions: Communicate number of staff needed for ambulation/transfer, Patient to call before getting OOB         Medication Interventions: Assess postural VS orthostatic hypotension, Evaluate medications/consider consulting pharmacy, Patient to call before getting OOB, Teach patient to arise slowly    Elimination Interventions: Call light in reach, Patient to call for help with toileting needs, Toileting schedule/hourly rounds             Problem: Pressure Injury - Risk of  Goal: *Prevention of pressure injury  Document Braden Scale and appropriate interventions in the flowsheet.  Outcome: Progressing Towards Goal  Pressure Injury Interventions:  Sensory Interventions: Assess changes in LOC, Float heels, Keep linens dry and wrinkle-free, Maintain/enhance activity level    Moisture Interventions: Absorbent underpads    Activity Interventions: Increase time out of bed    Mobility Interventions: HOB 30 degrees or less, Pressure redistribution bed/mattress (bed type)    Nutrition Interventions: Document food/fluid/supplement intake    Friction and Shear Interventions: HOB 30 degrees or less, Lift sheet

## 2017-02-14 NOTE — Progress Notes (Signed)
Pharmacist Note - Vancomycin Dosing  Therapy day 5  Indication: Neck/facial cellulitis  Current regimen: 1 gram IV Q 8 hours    A Trough Level resulted at 17.7 mcg/mL which was obtained 7 hrs post-dose.  The extrapolated "true" trough is approximately 16 mcg/mL based on the patient's known kinetics.       Goal trough: 10 - 15 mcg/mL     Plan: Change to 750 mg IV Q 8 hours . Pharmacy will continue to monitor this patient daily for changes in clinical status and renal function.

## 2017-02-14 NOTE — Progress Notes (Signed)
Patient improving - had to hold steroids again -   Inflammation decreased significantly   Still some pain in mouth and TMJ area  Cont IV abx, start PO Prednisone  Flossie DibbleJulie Glorya Bartley MD

## 2017-02-15 ENCOUNTER — Inpatient Hospital Stay: Admit: 2017-02-15 | Payer: BLUE CROSS/BLUE SHIELD | Primary: Primary Care

## 2017-02-15 LAB — CBC WITH AUTOMATED DIFF
ABS. BASOPHILS: 0 10*3/uL (ref 0.0–0.1)
ABS. EOSINOPHILS: 0 10*3/uL (ref 0.0–0.4)
ABS. IMM. GRANS.: 0.1 10*3/uL — ABNORMAL HIGH (ref 0.00–0.04)
ABS. LYMPHOCYTES: 1.8 10*3/uL (ref 0.8–3.5)
ABS. MONOCYTES: 0.7 10*3/uL (ref 0.0–1.0)
ABS. NEUTROPHILS: 9 10*3/uL — ABNORMAL HIGH (ref 1.8–8.0)
ABSOLUTE NRBC: 0 10*3/uL (ref 0.00–0.01)
BASOPHILS: 0 % (ref 0–1)
EOSINOPHILS: 0 % (ref 0–7)
HCT: 35 % (ref 35.0–47.0)
HGB: 12 g/dL (ref 11.5–16.0)
IMMATURE GRANULOCYTES: 1 % — ABNORMAL HIGH (ref 0.0–0.5)
LYMPHOCYTES: 16 % (ref 12–49)
MCH: 33.4 PG (ref 26.0–34.0)
MCHC: 34.3 g/dL (ref 30.0–36.5)
MCV: 97.5 FL (ref 80.0–99.0)
MONOCYTES: 6 % (ref 5–13)
MPV: 10.4 FL (ref 8.9–12.9)
NEUTROPHILS: 77 % — ABNORMAL HIGH (ref 32–75)
NRBC: 0 PER 100 WBC
PLATELET: 284 10*3/uL (ref 150–400)
RBC: 3.59 M/uL — ABNORMAL LOW (ref 3.80–5.20)
RDW: 11.7 % (ref 11.5–14.5)
WBC: 11.6 10*3/uL — ABNORMAL HIGH (ref 3.6–11.0)

## 2017-02-15 LAB — METABOLIC PANEL, BASIC
Anion gap: 6 mmol/L (ref 5–15)
BUN/Creatinine ratio: 15 (ref 12–20)
BUN: 15 MG/DL (ref 6–20)
CO2: 26 mmol/L (ref 21–32)
Calcium: 7.7 MG/DL — ABNORMAL LOW (ref 8.5–10.1)
Chloride: 107 mmol/L (ref 97–108)
Creatinine: 1.01 MG/DL (ref 0.55–1.02)
GFR est AA: >60 mL/min/{1.73_m2} (ref >60)
GFR est non-AA: >60 mL/min/{1.73_m2} (ref >60)
Glucose: 135 mg/dL — ABNORMAL HIGH (ref 65–100)
Potassium: 3.6 mmol/L (ref 3.5–5.1)
Sodium: 139 mmol/L (ref 136–145)

## 2017-02-15 LAB — MAGNESIUM: Magnesium: 1.9 mg/dL (ref 1.6–2.4)

## 2017-02-15 LAB — T4 (THYROXINE): T4, Total: 6.8 ug/dL (ref 4.8–13.9)

## 2017-02-15 LAB — TSH 3RD GENERATION: TSH: 1.25 u[IU]/mL (ref 0.36–3.74)

## 2017-02-15 MED ORDER — ACETAMINOPHEN 325 MG TABLET
325 mg | Freq: Four times a day (QID) | ORAL | Status: DC | PRN
Start: 2017-02-15 — End: 2017-02-20

## 2017-02-15 MED ORDER — SODIUM CHLORIDE 0.9 % IJ SYRG
Freq: Once | INTRAMUSCULAR | Status: AC
Start: 2017-02-15 — End: 2017-02-15
  Administered 2017-02-15 (×2): via INTRAVENOUS

## 2017-02-15 MED ORDER — LIDOCAINE 2 % MUCOSAL GEL
2 % | Status: DC | PRN
Start: 2017-02-15 — End: 2017-02-20
  Administered 2017-02-15: 22:00:00 via TOPICAL

## 2017-02-15 MED ORDER — SODIUM CHLORIDE 0.9% BOLUS IV
0.9 % | Freq: Once | INTRAVENOUS | Status: AC
Start: 2017-02-15 — End: 2017-02-15
  Administered 2017-02-15 (×2): via INTRAVENOUS

## 2017-02-15 MED ORDER — POTASSIUM CHLORIDE SR 10 MEQ TAB
10 mEq | ORAL | Status: AC
Start: 2017-02-15 — End: 2017-02-15
  Administered 2017-02-15: 14:00:00 via ORAL

## 2017-02-15 MED ORDER — IOPAMIDOL 61 % IV SOLN
300 mg iodine /mL (61 %) | Freq: Once | INTRAVENOUS | Status: AC
Start: 2017-02-15 — End: 2017-02-15
  Administered 2017-02-15 (×2): via INTRAVENOUS

## 2017-02-15 MED ORDER — PREDNISONE 20 MG TAB
20 mg | Freq: Every day | ORAL | Status: DC
Start: 2017-02-15 — End: 2017-02-20
  Administered 2017-02-15 – 2017-02-19 (×5): via ORAL

## 2017-02-15 MED ORDER — HYDROMORPHONE 2 MG/ML INJECTION SOLUTION
2 mg/mL | Freq: Four times a day (QID) | INTRAMUSCULAR | Status: DC | PRN
Start: 2017-02-15 — End: 2017-02-16
  Administered 2017-02-15 – 2017-02-16 (×2): via INTRAVENOUS

## 2017-02-15 MED ORDER — PHARMACY VANCOMYCIN NOTE
Freq: Once | Status: AC
Start: 2017-02-15 — End: 2017-02-15
  Administered 2017-02-16: 01:00:00

## 2017-02-15 MED FILL — AMPHETAMINE-DEXTROAMPHETAMINE 10 MG TAB: 10 mg | ORAL | Qty: 2

## 2017-02-15 MED FILL — NORMAL SALINE FLUSH 0.9 % INJECTION SYRINGE: INTRAMUSCULAR | Qty: 20

## 2017-02-15 MED FILL — K-TAB 10 MEQ TABLET,EXTENDED RELEASE: 10 mEq | ORAL | Qty: 2

## 2017-02-15 MED FILL — SENOKOT-S 8.6 MG-50 MG TABLET: ORAL | Qty: 2

## 2017-02-15 MED FILL — PIPERACILLIN-TAZOBACTAM 3.375 GRAM IV SOLR: 3.375 gram | INTRAVENOUS | Qty: 3.38

## 2017-02-15 MED FILL — NORMAL SALINE FLUSH 0.9 % INJECTION SYRINGE: INTRAMUSCULAR | Qty: 10

## 2017-02-15 MED FILL — ACETAMINOPHEN 650 MG/20.3 ML ORAL SOLN: 650 mg/20.3 mL | ORAL | Qty: 20.3

## 2017-02-15 MED FILL — ONDANSETRON (PF) 4 MG/2 ML INJECTION: 4 mg/2 mL | INTRAMUSCULAR | Qty: 2

## 2017-02-15 MED FILL — LIDOCAINE 2 % MUCOSAL GEL: 2 % | Qty: 5

## 2017-02-15 MED FILL — PREDNISONE 20 MG TAB: 20 mg | ORAL | Qty: 2

## 2017-02-15 MED FILL — PHARMACY VANCOMYCIN NOTE: Qty: 1

## 2017-02-15 MED FILL — VANCOMYCIN 750 MG IV SOLUTION: 750 mg | INTRAVENOUS | Qty: 750

## 2017-02-15 MED FILL — OXYCODONE 5 MG TAB: 5 mg | ORAL | Qty: 1

## 2017-02-15 MED FILL — HYDROMORPHONE 2 MG/ML INJECTION SOLUTION: 2 mg/mL | INTRAMUSCULAR | Qty: 1

## 2017-02-15 MED FILL — LORAZEPAM 2 MG/ML IJ SOLN: 2 mg/mL | INTRAMUSCULAR | Qty: 1

## 2017-02-15 MED FILL — ENOXAPARIN 40 MG/0.4 ML SUB-Q SYRINGE: 40 mg/0.4 mL | SUBCUTANEOUS | Qty: 0.4

## 2017-02-15 NOTE — Progress Notes (Addendum)
Problem: Cellulitis Care Plan (Adult)  Goal: *Absence of infection signs and symptoms  Outcome: Progressing Towards Goal  Pts blood pressure 176/84 this AM, pt reporting feeling swollen. Pt reporting 8/10 pain in her neck. Dr. Landry Corporalebeb notified of above. Orders received to discontinue IV fluids and monitor blood pressure.

## 2017-02-15 NOTE — Progress Notes (Signed)
US report reviewed.CT neck w/contrast ordered per discussion with Dr Sharl MaKerr earlier.

## 2017-02-15 NOTE — Progress Notes (Addendum)
[facility] Progress Note    02/15/2017 8:44 AM  Admit Date: 02/09/2017  Admit Diagnosis: Facial cellulitis [L03.211]    This pm pt agitated, complaining of pain. Reviewed sb, echo, etc.   Will watch on tele, follow peripherally.   Assessment/Plan     1.  Facial cellulitis - management per IM/ENT/ID  2.  Sinus bradycardia - in the setting of pain and nausea/vomiting it is likely related to high vagal tone  -will consider exercise treadmill to assess for chronotropic incompetence if symptomatic bradycardia persists  -TFTs ok, TTE ok  -continue to monitor  3.  Labile blood pressure - likely related to steroids, continue to monitor with steroid taper   4.  Hypokalemia - K 3.6, will give KCl PO once now??    Fam Hx: no early CAD  Soc Hx: no etoh use, smokes 1/2 ppd     Echo 1/19 -LVEF 55-60%, no WMA, mild MR    Subjective:     Zenaya Ulatowski reports constant chest tightness, some mild dyspnea, improvement in dizziness.  No syncope.  Seems less anxious today.      Objective:      Physical Exam:  Visit Vitals  BP 176/84 (BP 1 Location: Left arm, BP Patient Position: At rest)   Pulse (!) 42   Temp 97.6 ??F (36.4 ??C)   Resp 15   Ht 5\' 1"  (1.549 m)   Wt 166 lb 10.7 oz (75.6 kg)   SpO2 98%   Breastfeeding? No   BMI 31.49 kg/m??     General Appearance:  Well developed, well nourished, alert and oriented x 3, in no acute distress.   Ears/Nose/Mouth/Throat:   Hearing grossly normal.         Neck: Supple.   Chest:   Lungs clear to auscultation bilaterally.   Cardiovascular:  Bradycardic, regular rhythm, S1, S2 normal, no murmur.   Abdomen:   Soft, non-tender, bowel sounds are active.   Extremities: No edema bilaterally.    Skin: Warm and dry.           Telemetry: sinus bradycardia, VR 40s    Data Review:   Labs:    Recent Results (from the past 24 hour(s))   METABOLIC PANEL, BASIC    Collection Time: 02/15/17  4:38 AM   Result Value Ref Range    Sodium 139 136 - 145 mmol/L     Potassium 3.6 3.5 - 5.1 mmol/L    Chloride 107 97 - 108 mmol/L    CO2 26 21 - 32 mmol/L    Anion gap 6 5 - 15 mmol/L    Glucose 135 (H) 65 - 100 mg/dL    BUN 15 6 - 20 MG/DL    Creatinine 4.09 8.11 - 1.02 MG/DL    BUN/Creatinine ratio 15 12 - 20      GFR est AA >60 >60 ml/min/1.61m2    GFR est non-AA >60 >60 ml/min/1.21m2    Calcium 7.7 (L) 8.5 - 10.1 MG/DL   MAGNESIUM    Collection Time: 02/15/17  4:38 AM   Result Value Ref Range    Magnesium 1.9 1.6 - 2.4 mg/dL   T4 (THYROXINE)    Collection Time: 02/15/17  4:38 AM   Result Value Ref Range    T4, Total 6.8 4.8 - 13.9 ug/dL   TSH 3RD GENERATION    Collection Time: 02/15/17  4:38 AM   Result Value Ref Range    TSH 1.25 0.36 -  3.74 uIU/mL   CBC WITH AUTOMATED DIFF    Collection Time: 02/15/17  4:38 AM   Result Value Ref Range    WBC 11.6 (H) 3.6 - 11.0 K/uL    RBC 3.59 (L) 3.80 - 5.20 M/uL    HGB 12.0 11.5 - 16.0 g/dL    HCT 40.935.0 81.135.0 - 91.447.0 %    MCV 97.5 80.0 - 99.0 FL    MCH 33.4 26.0 - 34.0 PG    MCHC 34.3 30.0 - 36.5 g/dL    RDW 78.211.7 95.611.5 - 21.314.5 %    PLATELET 284 150 - 400 K/uL    MPV 10.4 8.9 - 12.9 FL    NRBC 0.0 0 PER 100 WBC    ABSOLUTE NRBC 0.00 0.00 - 0.01 K/uL    NEUTROPHILS 77 (H) 32 - 75 %    LYMPHOCYTES 16 12 - 49 %    MONOCYTES 6 5 - 13 %    EOSINOPHILS 0 0 - 7 %    BASOPHILS 0 0 - 1 %    IMMATURE GRANULOCYTES 1 (H) 0.0 - 0.5 %    ABS. NEUTROPHILS 9.0 (H) 1.8 - 8.0 K/UL    ABS. LYMPHOCYTES 1.8 0.8 - 3.5 K/UL    ABS. MONOCYTES 0.7 0.0 - 1.0 K/UL    ABS. EOSINOPHILS 0.0 0.0 - 0.4 K/UL    ABS. BASOPHILS 0.0 0.0 - 0.1 K/UL    ABS. IMM. GRANS. 0.1 (H) 0.00 - 0.04 K/UL    DF AUTOMATED             Current Facility-Administered Medications   Medication Dose Route Frequency   ??? potassium chloride SR (KLOR-CON 10) tablet 20 mEq  20 mEq Oral NOW   ??? vancomycin (VANCOCIN) 750 mg in 0.9% sodium chloride 250 mL (Vial2Bag)  750 mg IntraVENous Q8H   ??? HYDROmorphone (DILAUDID) injection 0.2 mg  0.2 mg IntraVENous Q6H PRN    ??? senna-docusate (PERICOLACE) 8.6-50 mg per tablet 2 Tab  2 Tab Oral DAILY   ??? vancomycin trough 1/14 @ 0500 prior to dose due same time   Other Rx Dosing/Monitoring   ??? albuterol-ipratropium (DUO-NEB) 2.5 MG-0.5 MG/3 ML  3 mL Nebulization Q6H PRN   ??? ondansetron (ZOFRAN) injection 4 mg  4 mg IntraVENous Q8H PRN   ??? hydrALAZINE (APRESOLINE) 20 mg/mL injection 5 mg  5 mg IntraVENous Q6H PRN   ??? LORazepam (ATIVAN) injection 0.5 mg  0.5 mg IntraVENous Q8H PRN   ??? dextroamphetamine-amphetamine (ADDERALL) tablet 20 mg  20 mg Oral BID   ??? oxyCODONE IR (ROXICODONE) tablet 5 mg  5 mg Oral Q6H PRN   ??? Vancomycin Pharmacy dosing   Other Rx Dosing/Monitoring   ??? acetaminophen (TYLENOL) solution 500 mg  500 mg Oral Q6H   ??? sodium chloride (NS) flush 5-40 mL  5-40 mL IntraVENous Q8H   ??? sodium chloride (NS) flush 5-40 mL  5-40 mL IntraVENous PRN   ??? enoxaparin (LOVENOX) injection 40 mg  40 mg SubCUTAneous Q24H   ??? 0.9% sodium chloride infusion  75 mL/hr IntraVENous CONTINUOUS   ??? piperacillin-tazobactam (ZOSYN) 3.375 g in 0.9% sodium chloride (MBP/ADV) 100 mL  3.375 g IntraVENous Q8H       Renita PapaKatherine G Diefenderfer, NP  [facility] [place]  [address], [address]  [place], [place] [id number]  [phone]

## 2017-02-15 NOTE — Progress Notes (Signed)
Hospitalist Progress Note  Maryclare LabradorWondaya T Chrishelle Zito, MD  Answering service: [phone] from in house phone        Date of Service:  02/15/2017  NAME:  Christina Stevens  DOB:  December 16, 1980  MRN:  [id number]      Admission Summary:   37 y.o F with PMH of ADHD,PCOS was admitted due to facial cellulitis.  Patient states that she was diagnosed with mononucleosis at her PCP office and was prescribed amoxicillin and steroids few weeks ago.Later she had cut herself while shaving and she continued to use amoxicillin for nearly 14 days. She also mentioned about going to [facility].  Again,she cut herself again while shaving and since then she noticed swelling,difficult to open her mouth and breathing issues.      Interval history / Subjective:   Patient seen and examined.Her partner in the room.She said she was able to swallow and had asked nurse to switch the tylenol  from liquid to pills,she confirmed the same with me.    Towards the end of the encounter,patient became tearful,she feels she was not getting better.She feels the swelling is moving across her face,that she is in a lot of pain and asks why "they did not do surgery."     Assessment & Plan:     #Facial and neck cellulitis:  -she sustained a nick  while shaving her face.  -CT neck on admission did not show any abscess but USG showed soft tissue swelling  -on IV vanc and zosyn  -ENT and ID following  -CT neck on 02/11/2017 showed fluid collection under chin,per Dr.kerr it is phlegmon and not abscess yet.  -CT on 1/13 showed non drain able fluid collection in the submental region,reactive lymph nodes.  -As she had symptoms of hyperactive delirium(was very anxious,chest discomfort,hyperventilation,was crying intermittently) while on IV decadron 8 mg Q 8hr it was stopped on 1/13.Started on prednisone on 1/15 as patient  Confirmed with mer and Dr Sharl MaKerr that she did not have any  problem with prednisone in the past and wants to try it.  -monitor vanc levels  -Pain management Roxicodone po prn.Increased dilaudid dose from 0.2 mg to 1 mg every 6 hours.    #Sinus bradycardia:asymptomatic  -TSH and T4 normal  -TTE unremarkable  -Suspected to be from high vagal tone.    #Elevated blood pressure:improving  -she does not have a diagnosis of HTN.It is likely due to steroids,fluids, and emotional distress  -prn hydralazine if symptomatic.  -D/c iv fluids.      Leukocytosis:  -likely due to steroids as cellulitis is improving.  -WBC close to normal     Morbid obesity:  Body mass index is 31.49 kg/m??.  -life style changes and weight loss recommended      #History of ADHD:  -on 20 mg adderall BID -confirmed dose from her pharmacy- resumed    "As she had chest pain and resp distress on 1/13 CT angio chest done which showed questionable filling defect  in the subsegmental branch of the right lower lobe pulmonary artery. However, given the degree of respiratory motion, definitive right lower lobe pulmonary embolus cannot be confirmed. She has been on lovenox since admission, it a subsegmental filling defect  -I  think she does not need therapeutic anticoagulation. Her resp distress is attributed to anxiety due to steroids."  -1/15:patient remained without shortness of breath,chest pain.SPo2 100% on room air,no tachycardia.No clinical suspicion for PE.Continue to monitor ,however,will have low threshold to  scan her lung if any respiratory symptoms develop.    Code status:Full  DVT prophylaxis: lovenox    Care Plan discussed with patient and Dr.Kerr.  -Patient does not feel she is improving and focused on getting the submental swelling opened up.I reviewed all the lab and imaging studies results with her and her partner.I informed her I will discuss with ENT if I and D would be reasonable and also may ask my palliative care colleagues to assist with acute pain management.All questions answered.     Disposition: TBD     Hospital Problems  Date Reviewed: 27-Feb-2017          Codes Class Noted POA    Facial cellulitis ICD-10-CM: L03.211  ICD-9-CM: 682.0  2017/02/27 Unknown                Review of Systems:   Pertinent items are noted in HPI.       Vital Signs:    Last 24hrs VS reviewed since prior progress note. Most recent are:  Visit Vitals  BP (!) 179/91 (BP 1 Location: Left arm, BP Patient Position: At rest)   Pulse (!) 46   Temp 97.6 ??F (36.4 ??C)   Resp 15   Ht 5\' 1"  (1.549 m)   Wt 75.6 kg (166 lb 10.7 oz)   SpO2 100%   Breastfeeding? No   BMI 31.49 kg/m??         Intake/Output Summary (Last 24 hours) at 02/15/2017 1413  Last data filed at 02/15/2017 0859  Gross per 24 hour   Intake 860 ml   Output 200 ml   Net 660 ml        Physical Examination:             Constitutional:  No acute distress, cooperative, pleasant??   ENT:  Oral mucous moist, no dental caries  -There a small submental mass,tender, non fluctuant,no apparent discharge.     Resp:  CTA bilaterally. No wheezing/rhonchi/rales. No accessory muscle use   CV:  Regular rhythm, normal rate, no murmurs, gallops, rubs    GI:  Soft, non distended, non tender. normoactive bowel sounds, no hepatosplenomegaly     Musculoskeletal:  trace edema, warm, 2+ pulses throughout    Neurologic:  Moves all extremities.  AAOx3            Data Review:    Review and/or order of clinical lab test  Review and/or order of tests in the medicine section of CPT      Labs:     Recent Labs     02/15/17  0438 02/13/17  0832   WBC 11.6* 17.3*   HGB 12.0 11.9   HCT 35.0 35.8   PLT 284 329     Recent Labs     02/15/17  0438 02/14/17  0428 02/13/17  0403   NA 139 139 138   K 3.6 3.7 3.7   CL 107 107 106   CO2 26 24 25    BUN 15 11 8    CREA 1.01 0.82 0.78   GLU 135* 99 141*   CA 7.7* 7.9* 8.3*   MG 1.9 2.1  --      No results for input(s): SGOT, GPT, ALT, AP, TBIL, TBILI, TP, ALB, GLOB, GGT, AML, LPSE in the last 72 hours.    No lab exists for component: AMYP, HLPSE   No results for input(s): INR, PTP, APTT in the last 72 hours.    No lab exists  for component: INREXT, INREXT   No results for input(s): FE, TIBC, PSAT, FERR in the last 72 hours.   No results found for: FOL, RBCF   No results for input(s): PH, PCO2, PO2 in the last 72 hours.  Recent Labs     02/13/17  1950 02/13/17  1248   TROIQ <0.05 <0.05     No results found for: CHOL, CHOLX, CHLST, CHOLV, HDL, LDL, LDLC, DLDLP, TGLX, TRIGL, TRIGP, CHHD, CHHDX  Lab Results   Component Value Date/Time    Glucose (POC) 126 (H) 02/13/2017 03:57 PM     Lab Results   Component Value Date/Time    Color YELLOW/STRAW 02/09/2017 07:34 PM    Appearance CLEAR 02/09/2017 07:34 PM    Specific gravity 1.010 02/09/2017 07:34 PM    pH (UA) 6.0 02/09/2017 07:34 PM    Protein NEGATIVE  02/09/2017 07:34 PM    Glucose NEGATIVE  02/09/2017 07:34 PM    Ketone NEGATIVE  02/09/2017 07:34 PM    Bilirubin NEGATIVE  02/09/2017 07:34 PM    Urobilinogen 0.2 02/09/2017 07:34 PM    Nitrites NEGATIVE  02/09/2017 07:34 PM    Leukocyte Esterase NEGATIVE  02/09/2017 07:34 PM    Epithelial cells MODERATE (A) 02/09/2017 07:34 PM    Bacteria NEGATIVE  02/09/2017 07:34 PM    WBC 0-4 02/09/2017 07:34 PM    RBC 0-5 02/09/2017 07:34 PM         Medications Reviewed:     Current Facility-Administered Medications   Medication Dose Route Frequency   ??? predniSONE (DELTASONE) tablet 40 mg  40 mg Oral DAILY WITH BREAKFAST   ??? acetaminophen (TYLENOL) tablet 650 mg  650 mg Oral Q6H PRN   ??? HYDROmorphone (DILAUDID) injection 1 mg  1 mg IntraVENous Q6H PRN   ??? Vancomycin trough- please draw immediately prior to 2000 vanc dose on 1/15. Thanks!   Other ONCE   ??? vancomycin (VANCOCIN) 750 mg in 0.9% sodium chloride 250 mL (Vial2Bag)  750 mg IntraVENous Q8H   ??? senna-docusate (PERICOLACE) 8.6-50 mg per tablet 2 Tab  2 Tab Oral DAILY   ??? albuterol-ipratropium (DUO-NEB) 2.5 MG-0.5 MG/3 ML  3 mL Nebulization Q6H PRN   ??? ondansetron (ZOFRAN) injection 4 mg  4 mg IntraVENous Q8H PRN    ??? hydrALAZINE (APRESOLINE) 20 mg/mL injection 5 mg  5 mg IntraVENous Q6H PRN   ??? LORazepam (ATIVAN) injection 0.5 mg  0.5 mg IntraVENous Q8H PRN   ??? dextroamphetamine-amphetamine (ADDERALL) tablet 20 mg  20 mg Oral BID   ??? oxyCODONE IR (ROXICODONE) tablet 5 mg  5 mg Oral Q6H PRN   ??? Vancomycin Pharmacy dosing   Other Rx Dosing/Monitoring   ??? sodium chloride (NS) flush 5-40 mL  5-40 mL IntraVENous Q8H   ??? sodium chloride (NS) flush 5-40 mL  5-40 mL IntraVENous PRN   ??? enoxaparin (LOVENOX) injection 40 mg  40 mg SubCUTAneous Q24H   ??? piperacillin-tazobactam (ZOSYN) 3.375 g in 0.9% sodium chloride (MBP/ADV) 100 mL  3.375 g IntraVENous Q8H     ______________________________________________________________________  EXPECTED LENGTH OF STAY: 3d 7h  ACTUAL LENGTH OF STAY:          6                 Gaetana Kawahara Zollie Beckers, MD

## 2017-02-15 NOTE — Progress Notes (Signed)
ID Progress Note  02/15/2017    Patient not in room. WBC near normal. Will check back tomorrow.         Clarivel Callaway Derinda LateGonzalez V, MD

## 2017-02-15 NOTE — Progress Notes (Signed)
Patient cont to improve - has not had IV steroids in > 24 hours on IV abx  Sig decrease in inflammation neck - no evidence of lesions in OC  Cont IV abx and start Prednisone  - ideally 40 mg po daily for 5 days, then 20 mg po daily 5 days then 10 mg po daily 5 days - when primary team is OK with it, transition to PO abx as well    Reconsult me as needed    Flossie DibbleJulie Maximum Reiland MD

## 2017-02-15 NOTE — Progress Notes (Signed)
Pharmacist Note - Vancomycin Dosing  Therapy day 6  Indication: Neck and facial cellulitis  Current regimen: 750 mg IV Q 8 hours    A Trough Level resulted at 10.5 mcg/mL which was obtained 8.5 hrs post-dose.  The extrapolated "true" trough is approximately 11 mcg/mL based on the patient's known kinetics.       Goal trough: 10 - 15 mcg/mL     Plan: Continue current regimen. Pharmacy will continue to monitor this patient daily for changes in clinical status and renal function.

## 2017-02-15 NOTE — Progress Notes (Signed)
Bedside and Verbal shift change report given to Sasha RN (oncoming nurse) by Jane RN (offgoing nurse). Report included the following information SBAR, Kardex, Procedure Summary, Intake/Output, MAR and Recent Results.

## 2017-02-15 NOTE — Progress Notes (Signed)
Reason for Admission:   Admitted from home with complaints of pain and facial swelling.                   RRAT Score:      6               Plan for utilizing home health:     No needs identified at this time.                     Likelihood of Readmission:  Low risk.                         Transition of Care Plan:      Follow up with PCP/ENT. Home with spouse. CM will follow for any needs that may arise.    Care Management Interventions  PCP Verified by CM: Yes(Dr Coggin)  Palliative Care Criteria Met (RRAT>21 & CHF Dx)?: No  Mode of Transport at Discharge: (Private car)  Current Support Network: Lives with Spouse, Own Home  Confirm Follow Up Transport: Self  Plan discussed with Pt/Family/Caregiver: Counselling psychologist Information Provided?: (NA)  Discharge Location  Discharge Placement: Home    Raymond Gurney, RN CRM

## 2017-02-16 LAB — CBC WITH AUTOMATED DIFF
ABS. BASOPHILS: 0 10*3/uL (ref 0.0–0.1)
ABS. EOSINOPHILS: 0 10*3/uL (ref 0.0–0.4)
ABS. IMM. GRANS.: 0.1 10*3/uL — ABNORMAL HIGH (ref 0.00–0.04)
ABS. LYMPHOCYTES: 2.7 10*3/uL (ref 0.8–3.5)
ABS. MONOCYTES: 0.8 10*3/uL (ref 0.0–1.0)
ABS. NEUTROPHILS: 9.9 10*3/uL — ABNORMAL HIGH (ref 1.8–8.0)
ABSOLUTE NRBC: 0 10*3/uL (ref 0.00–0.01)
BASOPHILS: 0 % (ref 0–1)
EOSINOPHILS: 0 % (ref 0–7)
HCT: 31.8 % — ABNORMAL LOW (ref 35.0–47.0)
HGB: 11 g/dL — ABNORMAL LOW (ref 11.5–16.0)
IMMATURE GRANULOCYTES: 1 % — ABNORMAL HIGH (ref 0.0–0.5)
LYMPHOCYTES: 20 % (ref 12–49)
MCH: 33 PG (ref 26.0–34.0)
MCHC: 34.6 g/dL (ref 30.0–36.5)
MCV: 95.5 FL (ref 80.0–99.0)
MONOCYTES: 6 % (ref 5–13)
MPV: 10.5 FL (ref 8.9–12.9)
NEUTROPHILS: 73 % (ref 32–75)
NRBC: 0 PER 100 WBC
PLATELET: 269 10*3/uL (ref 150–400)
RBC: 3.33 M/uL — ABNORMAL LOW (ref 3.80–5.20)
RDW: 11.5 % (ref 11.5–14.5)
WBC: 13.5 10*3/uL — ABNORMAL HIGH (ref 3.6–11.0)

## 2017-02-16 LAB — METABOLIC PANEL, BASIC
Anion gap: 7 mmol/L (ref 5–15)
Anion gap: 6 mmol/L (ref 5–15)
BUN/Creatinine ratio: 13 (ref 12–20)
BUN/Creatinine ratio: 12 (ref 12–20)
BUN: 11 MG/DL (ref 6–20)
BUN: 9 MG/DL (ref 6–20)
CO2: 24 mmol/L (ref 21–32)
CO2: 27 mmol/L (ref 21–32)
Calcium: 7.9 MG/DL — ABNORMAL LOW (ref 8.5–10.1)
Calcium: 8.2 MG/DL — ABNORMAL LOW (ref 8.5–10.1)
Chloride: 107 mmol/L (ref 97–108)
Chloride: 104 mmol/L (ref 97–108)
Creatinine: 0.85 MG/DL (ref 0.55–1.02)
Creatinine: 0.73 MG/DL (ref 0.55–1.02)
GFR est AA: >60 mL/min/{1.73_m2} (ref >60)
GFR est AA: >60 mL/min/{1.73_m2} (ref >60)
GFR est non-AA: >60 mL/min/{1.73_m2} (ref >60)
GFR est non-AA: >60 mL/min/{1.73_m2} (ref >60)
Glucose: 144 mg/dL — ABNORMAL HIGH (ref 65–100)
Glucose: 112 mg/dL — ABNORMAL HIGH (ref 65–100)
Potassium: 3.9 mmol/L (ref 3.5–5.1)
Potassium: 3.5 mmol/L (ref 3.5–5.1)
Sodium: 138 mmol/L (ref 136–145)
Sodium: 137 mmol/L (ref 136–145)

## 2017-02-16 LAB — VANCOMYCIN, TROUGH: Vancomycin,trough: 10.5 ug/mL — ABNORMAL HIGH (ref 5.0–10.0)

## 2017-02-16 LAB — HCG URINE, QL. - POC: Pregnancy test,urine (POC): NEGATIVE

## 2017-02-16 MED ORDER — HYDROMORPHONE 2 MG/ML INJECTION SOLUTION
2 mg/mL | INTRAMUSCULAR | Status: DC | PRN
Start: 2017-02-16 — End: 2017-02-20
  Administered 2017-02-16 – 2017-02-20 (×28): via INTRAVENOUS

## 2017-02-16 MED ORDER — BACITRACIN 500 UNIT/G OINTMENT
500 unit/gram | CUTANEOUS | Status: AC
Start: 2017-02-16 — End: ?

## 2017-02-16 MED ORDER — SODIUM CHLORIDE 0.9 % IV
INTRAVENOUS | Status: DC
Start: 2017-02-16 — End: 2017-02-16
  Administered 2017-02-16: 21:00:00 via INTRAVENOUS

## 2017-02-16 MED ORDER — SODIUM CHLORIDE 0.9 % IJ SYRG
Freq: Three times a day (TID) | INTRAMUSCULAR | Status: DC
Start: 2017-02-16 — End: 2017-02-16
  Administered 2017-02-16: 22:00:00 via INTRAVENOUS

## 2017-02-16 MED ORDER — ROPIVACAINE (PF) 5 MG/ML (0.5 %) INJECTION
5 mg/mL (0. %) | INTRAMUSCULAR | Status: DC | PRN
Start: 2017-02-16 — End: 2017-02-16

## 2017-02-16 MED ORDER — ONDANSETRON (PF) 4 MG/2 ML INJECTION
4 mg/2 mL | INTRAMUSCULAR | Status: DC | PRN
Start: 2017-02-16 — End: 2017-02-16

## 2017-02-16 MED ORDER — MORPHINE 10 MG/ML INJ SOLUTION
10 mg/ml | INTRAMUSCULAR | Status: DC | PRN
Start: 2017-02-16 — End: 2017-02-16

## 2017-02-16 MED ORDER — LIDOCAINE (PF) 10 MG/ML (1 %) IJ SOLN
10 mg/mL (1 %) | INTRAMUSCULAR | Status: DC | PRN
Start: 2017-02-16 — End: 2017-02-16
  Administered 2017-02-16: 22:00:00 via SUBCUTANEOUS

## 2017-02-16 MED ORDER — FENTANYL CITRATE (PF) 50 MCG/ML IJ SOLN
50 mcg/mL | INTRAMUSCULAR | Status: DC | PRN
Start: 2017-02-16 — End: 2017-02-16

## 2017-02-16 MED ORDER — NEOSTIGMINE METHYLSULFATE 1 MG/ML INJECTION
1 mg/mL | INTRAMUSCULAR | Status: DC | PRN
Start: 2017-02-16 — End: 2017-02-16
  Administered 2017-02-16: 22:00:00 via INTRAVENOUS

## 2017-02-16 MED ORDER — SODIUM CHLORIDE 0.9 % IJ SYRG
Freq: Three times a day (TID) | INTRAMUSCULAR | Status: DC
Start: 2017-02-16 — End: 2017-02-16
  Administered 2017-02-16: 21:00:00 via INTRAVENOUS

## 2017-02-16 MED ORDER — MIDAZOLAM 1 MG/ML IJ SOLN
1 mg/mL | INTRAMUSCULAR | Status: DC | PRN
Start: 2017-02-16 — End: 2017-02-16

## 2017-02-16 MED ORDER — ONDANSETRON (PF) 4 MG/2 ML INJECTION
4 mg/2 mL | INTRAMUSCULAR | Status: DC | PRN
Start: 2017-02-16 — End: 2017-02-16
  Administered 2017-02-16: 21:00:00 via INTRAVENOUS

## 2017-02-16 MED ORDER — HYDROMORPHONE 4 MG TAB
4 mg | ORAL | Status: DC | PRN
Start: 2017-02-16 — End: 2017-02-20
  Administered 2017-02-20: 14:00:00 via ORAL

## 2017-02-16 MED ORDER — FENTANYL CITRATE (PF) 50 MCG/ML IJ SOLN
50 mcg/mL | INTRAMUSCULAR | Status: DC | PRN
Start: 2017-02-16 — End: 2017-02-16
  Administered 2017-02-16: 21:00:00 via INTRAVENOUS

## 2017-02-16 MED ORDER — SODIUM CHLORIDE 0.9 % IJ SYRG
INTRAMUSCULAR | Status: DC | PRN
Start: 2017-02-16 — End: 2017-02-16

## 2017-02-16 MED ORDER — FENTANYL CITRATE (PF) 50 MCG/ML IJ SOLN
50 mcg/mL | INTRAMUSCULAR | Status: AC
Start: 2017-02-16 — End: ?

## 2017-02-16 MED ORDER — MIDAZOLAM 1 MG/ML IJ SOLN
1 mg/mL | INTRAMUSCULAR | Status: DC | PRN
Start: 2017-02-16 — End: 2017-02-16
  Administered 2017-02-16 (×2): via INTRAVENOUS

## 2017-02-16 MED ORDER — ROCURONIUM 10 MG/ML IV
10 mg/mL | INTRAVENOUS | Status: DC | PRN
Start: 2017-02-16 — End: 2017-02-16
  Administered 2017-02-16: 21:00:00 via INTRAVENOUS

## 2017-02-16 MED ORDER — HYDROMORPHONE (PF) 2 MG/ML IJ SOLN
2 mg/mL | INTRAMUSCULAR | Status: AC
Start: 2017-02-16 — End: ?

## 2017-02-16 MED ORDER — LACTATED RINGERS IV
INTRAVENOUS | Status: DC
Start: 2017-02-16 — End: 2017-02-16
  Administered 2017-02-16: 21:00:00 via INTRAVENOUS

## 2017-02-16 MED ORDER — LIDOCAINE-EPINEPHRINE 1 %-1:100,000 IJ SOLN
1 %-:00,000 | INTRAMUSCULAR | Status: AC
Start: 2017-02-16 — End: ?

## 2017-02-16 MED ORDER — PROPOFOL 10 MG/ML IV EMUL
10 mg/mL | INTRAVENOUS | Status: DC | PRN
Start: 2017-02-16 — End: 2017-02-16
  Administered 2017-02-16: 21:00:00 via INTRAVENOUS

## 2017-02-16 MED ORDER — MIDAZOLAM 1 MG/ML IJ SOLN
1 mg/mL | INTRAMUSCULAR | Status: AC
Start: 2017-02-16 — End: ?

## 2017-02-16 MED ORDER — LACTATED RINGERS IV
INTRAVENOUS | Status: DC
Start: 2017-02-16 — End: 2017-02-16

## 2017-02-16 MED ORDER — LIDOCAINE-EPINEPHRINE (PF) 1 %-1:200,000 IJ SOLN
1 %-:200,000 | INTRAMUSCULAR | Status: AC
Start: 2017-02-16 — End: ?

## 2017-02-16 MED ORDER — GLYCOPYRROLATE 0.2 MG/ML IJ SOLN
0.2 mg/mL | INTRAMUSCULAR | Status: DC | PRN
Start: 2017-02-16 — End: 2017-02-16
  Administered 2017-02-16: 22:00:00 via INTRAVENOUS

## 2017-02-16 MED ORDER — SUCCINYLCHOLINE CHLORIDE 20 MG/ML INJECTION
20 mg/mL | INTRAMUSCULAR | Status: DC | PRN
Start: 2017-02-16 — End: 2017-02-16
  Administered 2017-02-16: 21:00:00 via INTRAVENOUS

## 2017-02-16 MED ORDER — HYDROMORPHONE (PF) 1 MG/ML IJ SOLN
1 mg/mL | INTRAMUSCULAR | Status: DC | PRN
Start: 2017-02-16 — End: 2017-02-16
  Administered 2017-02-16: 21:00:00 via INTRAVENOUS

## 2017-02-16 MED ORDER — LIDOCAINE (PF) 20 MG/ML (2 %) IJ SOLN
20 mg/mL (2 %) | INTRAMUSCULAR | Status: DC | PRN
Start: 2017-02-16 — End: 2017-02-16
  Administered 2017-02-16: 21:00:00 via INTRAVENOUS

## 2017-02-16 MED FILL — PREDNISONE 20 MG TAB: 20 mg | ORAL | Qty: 2

## 2017-02-16 MED FILL — VANCOMYCIN 750 MG IV SOLUTION: 750 mg | INTRAVENOUS | Qty: 750

## 2017-02-16 MED FILL — XYLOCAINE-MPF/EPINEPHRINE 1 %-1:200,000 INJECTION SOLUTION: 1 %-:200,000 | INTRAMUSCULAR | Qty: 30

## 2017-02-16 MED FILL — HYDROMORPHONE 2 MG/ML INJECTION SOLUTION: 2 mg/mL | INTRAMUSCULAR | Qty: 1

## 2017-02-16 MED FILL — ENOXAPARIN 40 MG/0.4 ML SUB-Q SYRINGE: 40 mg/0.4 mL | SUBCUTANEOUS | Qty: 0.4

## 2017-02-16 MED FILL — SENOKOT-S 8.6 MG-50 MG TABLET: ORAL | Qty: 2

## 2017-02-16 MED FILL — MIDAZOLAM 1 MG/ML IJ SOLN: 1 mg/mL | INTRAMUSCULAR | Qty: 5

## 2017-02-16 MED FILL — XYLOCAINE WITH EPINEPHRINE 1 %-1:100,000 INJECTION SOLUTION: 1 %-:00,000 | INTRAMUSCULAR | Qty: 20

## 2017-02-16 MED FILL — PIPERACILLIN-TAZOBACTAM 3.375 GRAM IV SOLR: 3.375 gram | INTRAVENOUS | Qty: 3.38

## 2017-02-16 MED FILL — LORAZEPAM 2 MG/ML IJ SOLN: 2 mg/mL | INTRAMUSCULAR | Qty: 1

## 2017-02-16 MED FILL — LACTATED RINGERS IV: INTRAVENOUS | Qty: 1000

## 2017-02-16 MED FILL — FENTANYL CITRATE (PF) 50 MCG/ML IJ SOLN: 50 mcg/mL | INTRAMUSCULAR | Qty: 2

## 2017-02-16 MED FILL — HYDROMORPHONE (PF) 2 MG/ML IJ SOLN: 2 mg/mL | INTRAMUSCULAR | Qty: 1

## 2017-02-16 MED FILL — BACITRACIN 500 UNIT/G OINTMENT: 500 unit/gram | CUTANEOUS | Qty: 14

## 2017-02-16 MED FILL — AMPHETAMINE-DEXTROAMPHETAMINE 10 MG TAB: 10 mg | ORAL | Qty: 2

## 2017-02-16 NOTE — Op Note (Signed)
Preoperative Diagnosis:   Submental neck abscess  Postoperative Diagnosis: Same as above  Procedure Performed:  I&D of submental neck absccess  Surgeon: Flossie DibbleJulie Huldah Marin, MD  Assistants:None  Blood Loss: Less than 5 ml  Operative Findings: The abscess had approximately 4 ml of laudable pus - cultures sent -    Specimen to Lab: cultures  Description of Procedure:  The patient was consented.  The patient was brought back to the operating room and placed under general anesthesia.  The patient???s name and site for surgery were verified.  The patient was then prepped and draped in sterile fashion.   1% lido with 1:100 000 epi was injected in the site of the palnned incision.  An incision was made with 15 blade; the pus was drained.  Cultures were obtained.  Iodoform gauze was packed within the wound with a tag hanging out.  Dressed with sterile gauze and tape.

## 2017-02-16 NOTE — Anesthesia Post-Procedure Evaluation (Signed)
Formatting of this note is different from the original.  Post-Anesthesia Evaluation and Assessment    Patient: Christina Stevens MRN: [id number]  SSN: [id number]    Date of Birth: 04/24/1980  Age: 37 y.o.  Sex: female      Cardiovascular Function/Vital Signs  Visit Vitals  BP 124/48   Pulse (!) (P) 50   Temp (P) 36.8 C (98.2 F)   Resp 17   Ht 5' 1"$  (1.549 m)   Wt 75.7 kg (166 lb 14.2 oz)   SpO2 97%   Breastfeeding? No   BMI 31.53 kg/m     Patient is status post General anesthesia for Procedure(s):  INCISION AND DRAINAGE NECK abcess.    Nausea/Vomiting: None    Postoperative hydration reviewed and adequate.    Pain:  Pain Scale 1: (P) Numeric (0 - 10) (02/16/17 1817)  Pain Intensity 1: (P) 0 (02/16/17 1817)   Managed    Neurological Status:   Neuro (WDL): Within Defined Limits (02/16/17 1725)  Neuro  Neurologic State: Drowsy (02/16/17 1700)  Cognition: Appropriate for age attention/concentration (02/16/17 0839)  LUE Motor Response: Purposeful (02/16/17 1700)  LLE Motor Response: Purposeful (02/16/17 1700)  RUE Motor Response: Purposeful (02/16/17 1700)  RLE Motor Response: Purposeful (02/16/17 1700)   At baseline    Mental Status and Level of Consciousness: Alert and oriented to person, place, and time    Pulmonary Status:   O2 Device: (P) Room air (02/16/17 1817)   Adequate oxygenation and airway patent    Complications related to anesthesia: None    Post-anesthesia assessment completed. No concerns    Signed By: Linward Natal, MD     February 16, 2017          Procedure(s):  INCISION AND DRAINAGE NECK abcess.    <BSHSIANPOST>    Visit Vitals  BP 124/48   Pulse (!) (P) 50   Temp (P) 36.8 C (98.2 F)   Resp 17   Ht 5' 1"$  (1.549 m)   Wt 75.7 kg (166 lb 14.2 oz)   SpO2 97%   Breastfeeding? No   BMI 31.53 kg/m     Electronically signed by Linward Natal, MD at 02/16/2017  6:37 PM EST

## 2017-02-16 NOTE — Progress Notes (Signed)
Patient has been in house receiving IV abx and at times IV steroids - have not been able to utilize the IV steroids in doses that would be most beneficial due to behavioral effect of steroid.    Patient at times with improvement but then with persistence.  Have been following with serial imaging as needed.  02/13/17 CT without drainable fluid collection.  I did let patient know at initiation of treatment that there was a possibility that this would form into an abscess over time and she would benefit from staying in house with the IV abx and IV steroids to decrease this chance.  CT completed last night after U/S demonstrated now fluid collection - now demonstrates very slight rim enhancement and likely fluid within this - submental region.  I have discussed with patient that despite the medical management, that she will benefit from I&D.  I have dw patient that the IV abx and the intermittent steroids (PO steroids were note strong enough either) - did not ward of the abscess sufficiently from forming and now that the treatment would be to drain it and pack it.  Patient is in agreement.  She is clearly frustrated with the situation.  I have expressed my concern that if she transfers she would be starting all over with another doctor that hasn't been following her along to see the progression and her inability to tolerate the steroids at certain dosages.    Plan for I&D later today - have called OR to place her on schedule - time pending the pre scheduled cases    Flossie DibbleJulie Astha Probasco MD

## 2017-02-16 NOTE — Progress Notes (Signed)
Chart reviewed and discussed with Dr Lauro Regulus. Patient is refusing to allow current doctors to examine her and wants to be transferred to [facility]. This CM met with the patient at the bedside to discuss process for transfer. CM spoke with Larene Beach in the transfer center at [place] and relayed the medicals and Dr Magdalen Spatz cell number. An attending there will reach out to Dr Lauro Regulus if they are able to accept patient.    Raymond Gurney, RN CRM

## 2017-02-16 NOTE — Progress Notes (Signed)
TRANSFER - OUT REPORT:    Verbal report given to Sasha RN(name) on Christina Stevens  being transferred to 420(unit) for routine post - op       Report consisted of patient???s Situation, Background, Assessment and   Recommendations(SBAR).     Time Pre op antibiotic given:1500  Anesthesia Stop time: 1656  Foley Present on Transfer to floor:n  Order for Foley on Chart:n  Discharge Prescriptions with Chart:n    Information from the following report(s) SBAR, Kardex, OR Summary, Procedure Summary, Intake/Output and MAR was reviewed with the receiving nurse.    Opportunity for questions and clarification was provided.     Is the patient on 02? NO          Is the patient on a monitor? YES    Is the nurse transporting with the patient? YES    Surgical Waiting Area notified of patient's transfer from PACU? YES      The following personal items collected during your admission accompanied patient upon transfer:   Dental Appliance: Dental Appliances: None  Vision: Visual Aid: None  Hearing Aid:    Jewelry: Jewelry: None  Clothing: Clothing: Undergarments(sent to pacu)  Other Valuables: Other Valuables: Contact Lenses(sent to pacu)  Valuables sent to safe: Personal Items Sent to Safe: none

## 2017-02-16 NOTE — Progress Notes (Addendum)
Had received call from nursing earlier so came to see patient.  Went to patient's room with RN kristin.Patient is dressed in her clothes,very upset,stated " you are not going to touch me ,sir" and went on to say that the surgery could have been done days earlier ,"you guys have screwed up,I do not trust anyone of you here" wanted to go somewhere.She mentioned [facility].I tried to explained to her what we did yesterday and plans for today,that  after discussing with Dr Sharl MaKerr yesterday,we did US followed by CT which confirmed small abscess.The CT was done after end of my shift but I reviewed the report from home around 10:34 pm and ordered NPO so she does not lose the opportunity to possibly have the procedure today.  I called Dr Sharl MaKerr who just walked in to talk with patient.    I have talked to CM to initiate transfer "per patient request."    Braileigh Landenberger Zollie Beckers Mao Lockner, MD     Addendum  -Dr Sharl MaKerr talked with patient and she is now agreeable to stay and let Dr Sharl MaKerr do the I and D  -[facility] transfer center called me ,whil I had on there line when to let patient know of this.She and her partner were talking with patient advocacy.They both said to cancel the transfer and decided to stray.Transfer cancelled.

## 2017-02-16 NOTE — Anesthesia Pre-Procedure Evaluation (Signed)
Anesthetic History   No history of anesthetic complications            Review of Systems / Medical History  Patient summary reviewed, nursing notes reviewed and pertinent labs reviewed    Pulmonary          Smoker         Neuro/Psych         Psychiatric history     Cardiovascular  Within defined limits                Exercise tolerance: >4 METS     GI/Hepatic/Renal  Within defined limits              Endo/Other  Within defined limits           Other Findings   Comments: Neck abscess  PCOS           Physical Exam    Airway  Mallampati: II  TM Distance: > 6 cm  Neck ROM: normal range of motion   Mouth opening: Diminished (comment)    Comments: Mouth diminished due to pain Cardiovascular    Rhythm: regular  Rate: normal         Dental    Dentition: Lower dentition intact and Upper dentition intact     Pulmonary  Breath sounds clear to auscultation               Abdominal  GI exam deferred       Other Findings            Anesthetic Plan    ASA: 2  Anesthesia type: general          Induction: Intravenous  Anesthetic plan and risks discussed with: Patient

## 2017-02-16 NOTE — Other (Signed)
Patient: Merrily Peweresa Wiechman MRN: [id number]  SSN: [id number]   Date of Birth: [birth year]  Age: 37 y.o.  Sex: female     Patient is status post Procedure(s):  INCISION AND DRAINAGE NECK abcess.    Surgeon(s) and Role:     Tylene Fantasia* Kerr, Julie T, MD - Primary    Local/Dose/Irrigation:  See mar                  Peripheral IV 02/16/17 Left;Posterior Hand (Active)   Site Assessment Clean, dry, & intact 02/16/2017  4:13 PM   Phlebitis Assessment 0 02/16/2017  4:13 PM   Infiltration Assessment 0 02/16/2017  4:13 PM   Dressing Status Clean, dry, & intact 02/16/2017  4:13 PM   Dressing Type Tape;Transparent 02/16/2017  4:13 PM   Hub Color/Line Status Pink;Infusing 02/16/2017  4:13 PM            Airway - Endotracheal Tube 02/16/17 Oral (Active)                   Dressing/Packing:  Wound Neck-DRESSING TYPE: Packing;4 x 4;Special tape (comment) (02/16/17 1645)

## 2017-02-16 NOTE — Anesthesia Post-Procedure Evaluation (Signed)
Post-Anesthesia Evaluation and Assessment  Patient: Christina Stevens MRN: [id number]  SSN: [id number]    Date of Birth: [birth year]  Age: 37 y.o.  Sex: female       Cardiovascular Function/Vital Signs  Visit Vitals  BP 124/48   Pulse (!) (P) 50   Temp (P) 36.8 ??C (98.2 ??F)   Resp 17   Ht 5\' 1"  (1.549 m)   Wt 75.7 kg (166 lb 14.2 oz)   SpO2 97%   Breastfeeding? No   BMI 31.53 kg/m??       Patient is status post General anesthesia for Procedure(s):  INCISION AND DRAINAGE NECK abcess.    Nausea/Vomiting: None    Postoperative hydration reviewed and adequate.    Pain:  Pain Scale 1: (P) Numeric (0 - 10) (02/16/17 1817)  Pain Intensity 1: (P) 0 (02/16/17 1817)   Managed    Neurological Status:   Neuro (WDL): Within Defined Limits (02/16/17 1725)  Neuro  Neurologic State: Drowsy (02/16/17 1700)  Cognition: Appropriate for age attention/concentration (02/16/17 0839)  LUE Motor Response: Purposeful (02/16/17 1700)  LLE Motor Response: Purposeful (02/16/17 1700)  RUE Motor Response: Purposeful (02/16/17 1700)  RLE Motor Response: Purposeful (02/16/17 1700)   At baseline    Mental Status and Level of Consciousness: Alert and oriented to person, place, and time    Pulmonary Status:   O2 Device: (P) Room air (02/16/17 1817)   Adequate oxygenation and airway patent    Complications related to anesthesia: None    Post-anesthesia assessment completed. No concerns    Signed By: Dorann OuMary W Yisroel Mullendore, MD     February 16, 2017              Procedure(s):  INCISION AND DRAINAGE NECK abcess.    <BSHSIANPOST>    Visit Vitals  BP 124/48   Pulse (!) (P) 50   Temp (P) 36.8 ??C (98.2 ??F)   Resp 17   Ht 5\' 1"  (1.549 m)   Wt 75.7 kg (166 lb 14.2 oz)   SpO2 97%   Breastfeeding? No   BMI 31.53 kg/m??

## 2017-02-16 NOTE — Progress Notes (Signed)
Spiritual Care Assessment/Progress Note  [facility]      NAME: Christina Stevens      MRN: [id number]  AGE: 37 y.o. SEX: female  Religious Affiliation: Catholic   Language: English     02/16/2017           [facility] through conversation with:         [x] Patient        [x]  Family    []  Friend(s)        Reason for Consult: Palliative Care, Initial/Spiritual Assessment     Spiritual beliefs: (Please include comment if needed)     [x]  Identifies with a faith tradition:         []  Supported by a faith community:            []  Claims no spiritual orientation:           []  Seeking spiritual identity:                []  Adheres to an individual form of spirituality:           []  Not able to assess:                           Identified resources for coping:      [x]  Prayer                               [x]  Music                  []  Guided Imagery     [x]  Family/friends                 []  Pet visits     []  Devotional reading                         []  Unknown     []  Other:                                              Interventions offered during this visit: (See comments for more details)    Patient Interventions: Affirmation of emotions/emotional suffering, Catharsis/review of pertinent events in supportive environment, Iconic (affirming the presence of God/Higher Power), Initial/Spiritual assessment, patient floor, Normalization of emotional/spiritual concerns, Prayer (actual), Prayer (assurance of), Referral to music therapy     Family/Friend(s): Affirmation of emotions/emotional suffering, Catharsis/review of pertinent events in supportive environment, Iconic (affirming the presence of God/Higher Power), Initial Assessment, Normalization of emotional/spiritual concerns, Prayer (actual), Prayer (assurance of)     Plan of Care:     [x]  Support spiritual and/or cultural needs    []  Support AMD and/or advance care planning process      []  Support grieving process    []  Coordinate Rites and/or Rituals    []  Coordination with community clergy   []  No spiritual needs identified at this time   []  Detailed Plan of Care below (See Comments)  [x]  Make referral to Music Therapy  []  Make referral to Pet Therapy     []  Make referral to Addiction services  []  [facility]  []  Make referral to Spiritual Care Partner  []  No  future visits requested        [x]  Follow up visits as needed     Comments: Visited Ms Bouska in room 420 for initial Palliative Care spiritual assessment. Ms Scrivens was sitting at the bedside; she was tearful and being comforted by her wife. Provided active listening as she shared about her medical issue and her experiences since being a patient at [facility]. She verbalized feelings of fear and frustration, while continuing to cry. Ms Malak shared that she was scheduled for a surgical procedure this afternoon and she was experiecing fear related to that. Patient's wife was very supportive of her and requested that chaplain have prayer on her behalf.     With patient's permission, had prayer on behalf of her and her family and assured her of continued prayers on their behalf. Informed patient of the availability of Music Therapy and inquired if she would be interested in that and she stated that she would. Chaplin will consult with MT, Judeth Cornfield. Also informed patient and wife of 24-hour chaplain availability for support. Chaplain: Rev. Cyndia Diver. Cobb, MDiv; B[facility], to contact Spiritual Care Services call: [phone]

## 2017-02-16 NOTE — Progress Notes (Addendum)
Problem: Patient Education: Go to Patient Education Activity  Goal: Patient/Family Education  Outcome: Progressing Towards Goal  Pt pacing around room this AM, agitated with staff. Pt refusing to put on telemetry and receive IV antibiotics. Pts wife arrived, pt and wife arguing. Dr. Sharl MaKerr arrived, pt willing to discuss treatment with Dr. Sharl MaKerr. Pt willing to do procedure with Dr. Sharl MaKerr this afternoon. Verbal orders received from Dr. Sharl MaKerr for pt to have breakfast and then be NPO for the afternoon and for pt to be able to go outside of hospital for 15-20 minutes for "fresh air." Dr. Landry Corporalebeb and charge nurse notified of above order. Pt agreeable to wearing telemetry and receiving IV antibiotics after coming back from outside. Pt and wife left unit for 15 minutes. Pts vital signs stable when arriving back to unit. Pt reports feeling tired and having episodes of vomiting outside. There was concerns raised by manager Synetta FailPeter Karrafa and nursing supervisor about pt and wife being off unit and off telemetry. Dr. Sharl MaKerr notified of concerns, orders received to discontinue order for pt to be allowed off unit.     Bedside shift change report given to Hezzie BumpAman, RN (oncoming nurse) by Georgeanna HarrisonSasha, RN (offgoing nurse). Report included the following information SBAR, Kardex, ED Summary, Procedure Summary, Intake/Output, MAR, Accordion, Recent Results, Med Rec Status, Cardiac Rhythm NSR and Alarm Parameters .

## 2017-02-16 NOTE — Progress Notes (Addendum)
Bedside shift change report given to Alana (oncoming nurse) by Hezzie BumpAman (offgoing nurse). Report included the following information SBAR, Kardex, ED Summary, Procedure Summary, Intake/Output, Accordion and Recent Results.                 Problem: Cellulitis Care Plan (Adult)  Goal: *Control of acute pain  Outcome: Progressing Towards Goal  Dilaudid given for pain     Problem: Falls - Risk of  Goal: *Absence of Falls  Document Schmid Fall Risk and appropriate interventions in the flowsheet.  Outcome: Progressing Towards Goal  Fall Risk Interventions:  Mobility Interventions: Communicate number of staff needed for ambulation/transfer, Patient to call before getting OOB, PT Consult for mobility concerns, PT Consult for assist device competence         Medication Interventions: Evaluate medications/consider consulting pharmacy, Teach patient to arise slowly, Patient to call before getting OOB    Elimination Interventions: Call light in reach, Patient to call for help with toileting needs, Toilet paper/wipes in reach, Toileting schedule/hourly rounds

## 2017-02-16 NOTE — Progress Notes (Signed)
NUTRITION  Pt seen for:       []            Supplements  []              PO intake check   []            Food Allergies  []              Food Preferences/tolerances    [x]            Rescreen   []              Education    []            Diet order clarification []              Other            RECOMMENDATIONS:   Resume Regular diet post-op    SUBJECTIVE/OBJECTIVE:   Chart reviewed, discussed with RN.  Pt admitted with facial cellulitis.  She states she's eating well without issues or changes in her appetite.  She was confused about menu selections after transferring from the 5th floor (those floors have room service) and she has not been receiving menu selections.  Wife brings in food from home.  No nutrition risk identified at this time.  Will rescreen as indicated.    Diet: NPO for I&D (has otherwise been on a Regular diet)     Intake: [x]            Good (most meals from outside the hospital)    []            Fair      []            Poor   Patient Vitals for the past 100 hrs:   % Diet Eaten   02/15/17 0859 10 %   02/14/17 1903 50 %   02/14/17 1220 20 %   02/14/17 0820 50 %     Weight Changes: Last 3 Recorded Weights in this Encounter    02/14/17 1830 02/15/17 0427 02/16/17 0356   Weight: 75.5 kg (166 lb 7.2 oz) 75.6 kg (166 lb 10.7 oz) 75.7 kg (166 lb 14.2 oz)     Nutrition Problems Identified:[x]       None      PLAN:     []            Obtained/adjusted food preferences/tolerances and/or snacks options   []            Dislikes supplements will try a substitution   []            Modify diet for food allergies  []            Adjust texture due to difficulty chewing   []            Encourage Fresh Fruit, Activia yogurt, fluid   []            Educated patient  [x]            Rescreen per screening protocol  []            Add Supplements    Rescreen:  []             At Nutrition Risk           [x]             Not at Nutrition Risk, rescreen per screening protocol    EMILY Clide CliffKLINE, RD CSO

## 2017-02-16 NOTE — Op Note (Signed)
Preoperative Diagnosis:   Submental neck abscess  Postoperative Diagnosis: Same as above  Procedure Performed:  I&D of submental neck absccess  Surgeon: Kyarra Vancamp, MD  Assistants:None  Blood Loss: Less than 5 ml  Operative Findings: The abscess had approximately 4 ml of laudable pus - cultures sent -    Specimen to Lab: cultures  Description of Procedure:  The patient was consented.  The patient was brought back to the operating room and placed under general anesthesia.  The patient???s name and site for surgery were verified.  The patient was then prepped and draped in sterile fashion.   1% lido with 1:100 000 epi was injected in the site of the palnned incision.  An incision was made with 15 blade; the pus was drained.  Cultures were obtained.  Iodoform gauze was packed within the wound with a tag hanging out.  Dressed with sterile gauze and tape.

## 2017-02-16 NOTE — Other (Signed)
TRANSFER - IN REPORT:    Verbal report received from Sasha RN(name) on Christina Stevens  [facility]      Report consisted of patient???s Situation, Background, Assessment and   Recommendations(SBAR).     Information from the following report(s) SBAR, Kardex, Intake/Output and MAR was reviewed with the receiving nurse.    Opportunity for questions and clarification was provided.      Assessment completed upon patient???s arrival to unit and care assumed.

## 2017-02-16 NOTE — Progress Notes (Addendum)
ID Progress Note  02/16/2017    As I walked in to her room, she told me she doesn't want to see me (I really don't know the reason why) and says Dr. Sharl MaKerr can handle it. I will sign off. Please call with questions.       Micky Sheller Derinda LateGonzalez V, MD

## 2017-02-16 NOTE — Progress Notes (Signed)
Hospitalist Progress Note  [name], MD  Answering service: [phone] OR [phone] from in house phone        Date of Service:  02-18-2017  NAME:  Christina Stevens  DOB:  30-May-1980  MRN:  [id number]      Admission Summary:   37 y.o F with PMH of ADHD,PCOS was admitted due to facial cellulitis.  Patient states that she was diagnosed with mononucleosis at her PCP office and was prescribed amoxicillin and steroids few weeks ago.Later she had cut herself while shaving and she continued to use amoxicillin for nearly 14 days. She also mentioned about going to [facility] ER.  Again,she cut herself again while shaving and since then she noticed swelling,difficult to open her mouth and breathing issues.      Interval history / Subjective:   Patient was seen earlier today(see separate note).Revisited patient with RN Barkley Bruns.  She is back from OR s/p submental abscess incision and drainage.  She is apologetic for what happened this morning      Assessment & Plan:     #Facial and neck cellulitis:  -she sustained a nick  while shaving her face.  -CT neck on admission did not show any abscess but USG showed soft tissue swelling  -on IV vanc and zosyn  -ENT and ID following  -CT neck on 02/11/2017 showed fluid collection under chin,per Dr.kerr it is phlegmon and not abscess yet.  -CT on 1/13 showed non drain able fluid collection in the submental region,reactive lymph nodes.  -As she had symptoms of hyperactive delirium(was very anxious,chest discomfort,hyperventilation,was crying intermittently) while on IV decadron 8 mg Q 8hr it was stopped on 1/13.Started on prednisone on 1/15 as patient  Confirmed with mer and Dr Sharl Ma that she did not have any problem with prednisone in the past and wants to try it.  -monitor vanc levels  -CT 02/18/22 showed abscess formation  -Feb 18, 2022: underwent I and D    #Sinus bradycardia:asymptomatic  -TSH and T4 normal  -TTE unremarkable   -Suspected to be from high vagal tone.    #Elevated blood pressure:improving  -she does not have a diagnosis of HTN.It is likely due to steroids,fluids, and emotional distress  -prn hydralazine if symptomatic.  -D/c iv fluids.      Leukocytosis:  -likely due to steroids as cellulitis is improving.  -WBC close to normal     Morbid obesity:  Body mass index is 31.53 kg/m??.  -life style changes and weight loss recommended      #History of ADHD:  -on 20 mg adderall BID -confirmed dose from her pharmacy- resumed    "As she had chest pain and resp distress on 1/13 CT angio chest done which showed questionable filling defect  in the subsegmental branch of the right lower lobe pulmonary artery. However, given the degree of respiratory motion, definitive right lower lobe pulmonary embolus cannot be confirmed. She has been on lovenox since admission, it a subsegmental filling defect  -I  think she does not need therapeutic anticoagulation. Her resp distress is attributed to anxiety due to steroids."  -1/15:patient remained without shortness of breath,chest pain.SPo2 100% on room air,no tachycardia.No clinical suspicion for PE.Continue to monitor ,however,will have low threshold to scan her lung if any respiratory symptoms develop.    Code status:Full  DVT prophylaxis: lovenox     Hospital Problems  Date Reviewed: 02/18/2017          Codes Class Noted POA    Facial  cellulitis ICD-10-CM: L03.211  ICD-9-CM: 682.0  02/09/2017 Unknown                Review of Systems:   Pertinent items are noted in HPI.       Vital Signs:    Last 24hrs VS reviewed since prior progress note. Most recent are:  Visit Vitals  BP (!) 152/99 (BP 1 Location: Left arm, BP Patient Position: At rest;Supine)   Pulse 68   Temp 97.8 ??F (36.6 ??C)   Resp 18   Ht 5\' 1"  (1.549 m)   Wt 75.7 kg (166 lb 14.2 oz)   SpO2 97%   Breastfeeding? No   BMI 31.53 kg/m??         Intake/Output Summary (Last 24 hours) at 02/16/2017 2038  Last data filed at 02/16/2017 1641   Gross per 24 hour   Intake 300 ml   Output 5 ml   Net 295 ml        Physical Examination:             Constitutional:  No acute distress, cooperative, pleasant??   ENT:  Oral mucous moist, no dental caries  -dressing on the under side of chin.     Resp:  CTA bilaterally. No wheezing/rhonchi/rales. No accessory muscle use   CV:  Regular rhythm, normal rate, no murmurs, gallops, rubs    GI:  Soft, non distended, non tender. normoactive bowel sounds, no hepatosplenomegaly     Musculoskeletal:  trace edema, warm, 2+ pulses throughout    Neurologic:  Moves all extremities.  AAOx3            Data Review:    Review and/or order of clinical lab test  Review and/or order of tests in the medicine section of CPT      Labs:     Recent Labs     02/16/17  0326 02/15/17  0438   WBC 13.5* 11.6*   HGB 11.0* 12.0   HCT 31.8* 35.0   PLT 269 284     Recent Labs     02/16/17  0326 02/15/17  2015 02/15/17  0438 02/14/17  0428   NA 137 138 139 139   K 3.5 3.9 3.6 3.7   CL 104 107 107 107   CO2 27 24 26 24    BUN 9 11 15 11    CREA 0.73 0.85 1.01 0.82   GLU 112* 144* 135* 99   CA 8.2* 7.9* 7.7* 7.9*   MG  --   --  1.9 2.1     No results for input(s): SGOT, GPT, ALT, AP, TBIL, TBILI, TP, ALB, GLOB, GGT, AML, LPSE in the last 72 hours.    No lab exists for component: AMYP, HLPSE  No results for input(s): INR, PTP, APTT in the last 72 hours.    No lab exists for component: INREXT, INREXT   No results for input(s): FE, TIBC, PSAT, FERR in the last 72 hours.   No results found for: FOL, RBCF   No results for input(s): PH, PCO2, PO2 in the last 72 hours.  No results for input(s): CPK, CKNDX, TROIQ in the last 72 hours.    No lab exists for component: CPKMB  No results found for: CHOL, CHOLX, CHLST, CHOLV, HDL, LDL, LDLC, DLDLP, TGLX, TRIGL, TRIGP, CHHD, CHHDX  Lab Results   Component Value Date/Time    Glucose (POC) 126 (H) 02/13/2017 03:57 PM     Lab Results   Component Value  Date/Time    Color YELLOW/STRAW 02/09/2017 07:34 PM     Appearance CLEAR 02/09/2017 07:34 PM    Specific gravity 1.010 02/09/2017 07:34 PM    pH (UA) 6.0 02/09/2017 07:34 PM    Protein NEGATIVE  02/09/2017 07:34 PM    Glucose NEGATIVE  02/09/2017 07:34 PM    Ketone NEGATIVE  02/09/2017 07:34 PM    Bilirubin NEGATIVE  02/09/2017 07:34 PM    Urobilinogen 0.2 02/09/2017 07:34 PM    Nitrites NEGATIVE  02/09/2017 07:34 PM    Leukocyte Esterase NEGATIVE  02/09/2017 07:34 PM    Epithelial cells MODERATE (A) 02/09/2017 07:34 PM    Bacteria NEGATIVE  02/09/2017 07:34 PM    WBC 0-4 02/09/2017 07:34 PM    RBC 0-5 02/09/2017 07:34 PM         Medications Reviewed:     Current Facility-Administered Medications   Medication Dose Route Frequency   ??? HYDROmorphone (DILAUDID) injection 1 mg  1 mg IntraVENous Q3H PRN   ??? HYDROmorphone (DILAUDID) tablet 4 mg  4 mg Oral Q4H PRN   ??? predniSONE (DELTASONE) tablet 40 mg  40 mg Oral DAILY WITH BREAKFAST   ??? acetaminophen (TYLENOL) tablet 650 mg  650 mg Oral Q6H PRN   ??? lidocaine (XYLOCAINE) 2 % jelly   Topical PRN   ??? vancomycin (VANCOCIN) 750 mg in 0.9% sodium chloride 250 mL (Vial2Bag)  750 mg IntraVENous Q8H   ??? senna-docusate (PERICOLACE) 8.6-50 mg per tablet 2 Tab  2 Tab Oral DAILY   ??? albuterol-ipratropium (DUO-NEB) 2.5 MG-0.5 MG/3 ML  3 mL Nebulization Q6H PRN   ??? ondansetron (ZOFRAN) injection 4 mg  4 mg IntraVENous Q8H PRN   ??? hydrALAZINE (APRESOLINE) 20 mg/mL injection 5 mg  5 mg IntraVENous Q6H PRN   ??? LORazepam (ATIVAN) injection 0.5 mg  0.5 mg IntraVENous Q8H PRN   ??? dextroamphetamine-amphetamine (ADDERALL) tablet 20 mg  20 mg Oral BID   ??? Vancomycin Pharmacy dosing   Other Rx Dosing/Monitoring   ??? sodium chloride (NS) flush 5-40 mL  5-40 mL IntraVENous Q8H   ??? sodium chloride (NS) flush 5-40 mL  5-40 mL IntraVENous PRN   ??? enoxaparin (LOVENOX) injection 40 mg  40 mg SubCUTAneous Q24H   ??? piperacillin-tazobactam (ZOSYN) 3.375 g in 0.9% sodium chloride (MBP/ADV) 100 mL  3.375 g IntraVENous Q8H      ______________________________________________________________________  EXPECTED LENGTH OF STAY: 3d 7h  ACTUAL LENGTH OF STAY:          7                 Demica Zook Zollie Beckers Makalyn Lennox, MD

## 2017-02-16 NOTE — Progress Notes (Signed)
Nurse paged MD about better pain management for patient.  Patient was getting Dilauid 1mg  Q6.  MD changed order to 1mg  Dilauid Q3.

## 2017-02-16 NOTE — Progress Notes (Signed)
Patient is post I&D of abscess - cont IV abx at least 24 hours and oral pred - watch for culture results and sensitivities   Wound care to teach removal of 1/2 cm of gauze from wound bid until wound is closed and gauze is out of wound - will likely do this after discharge at home -   ONce sensitivities are known, would allow discharge home on PO abx with the packing removed over time    Follow up with me in office in 2 weeks    Flossie DibbleJulie Reyes Fifield MD

## 2017-02-16 NOTE — Consults (Signed)
Palliative Medicine Consult  [place]: [phone])  Patient Name: Christina Stevens  Date of Birth: August 22, 1980    Date of Initial Consult: February 17, 2016  Reason for Consult: Pain Management  Requesting Provider: Dr. Lauro Regulus   Primary Care Physician: Sherley Bounds, NP     SUMMARY:   Christina Stevens is a 37 y.o. with a past history of PCOS, ADHD, MONO, who was admitted on 02/09/2017 from home with a diagnosis of facial cellulitis that progressed into submental abscess formation.  ENT involved with plans for surgical drainage. Current medical issues leading to Palliative Medicine involvement include: support with pain management.    Issues: acute facial and neck cellulitis, leukocytosis, sinus bradycardia, elevated blood pressure       PALLIATIVE DIAGNOSES:   1. Submental Cyst  2. Facial Pain  3. Odynophagia  4. Cellulitis of the face     PLAN:   1. Pt seen with wife   2. Pain regimen discussed in detail.  Plans for I&D soon  3. Explained that once the cyst is drained, pain should improve  4. Agree with current meds  5. Pt says the oxycodone does not do much at all but the iv dilaudid works well  6. Pt npo so continue with the iv for the next 24 hours given possibility of post op discomfort  7. Will change oxy ir to oral dilaudid given its ineffective and to have something oral available to transition off the iv  8. Will follow up tomorrow  9. Initial consult note routed to primary continuity provider  10. 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     HISTORY:     History obtained from: chart    CHIEF COMPLAINT: pain below chin    HPI/SUBJECTIVE:    The patient is:   [x]  Verbal and participatory  []  Non-participatory due to:   Pain below chin from cyst, facial burning, and painful swallow at times     Clinical Pain Assessment (nonverbal scale for severity on nonverbal patients):   Clinical Pain Assessment  Severity: 5  Location: submental area  Character: facial buring and pressure with unusual swallowing  Duration: week  Factors: iv dilaudid is helping  Frequency: all the time          Duration: for how long has pt been experiencing pain (e.g., 2 days, 1 month, years)  Frequency: how  often pain is an issue (e.g., several times per day, once every few days, constant)     FUNCTIONAL ASSESSMENT:     Palliative Performance Scale (PPS):  PPS: 100       PSYCHOSOCIAL/SPIRITUAL SCREENING:     Palliative IDT has assessed this patient for cultural preferences / practices and a referral made as appropriate to needs Orthoptist, Patient Advocacy, Ethics, etc.)    Any spiritual / religious concerns:  []  Yes /  [x]  No    Caregiver Burnout:  []  Yes /  [x]  No /  []  No Caregiver Present      Anticipatory grief assessment:   [x]  Normal  / []  Maladaptive       ESAS Anxiety: Anxiety: 4    ESAS Depression: Depression: 0        REVIEW OF SYSTEMS:     Positive and pertinent negative findings in ROS are noted above in HPI.   The following systems were [x]  reviewed / []  unable to be reviewed as noted in HPI  Other findings are noted below.  Systems: constitutional, ears/nose/mouth/throat, respiratory, gastrointestinal, genitourinary, musculoskeletal, integumentary, neurologic, psychiatric, endocrine. Positive findings noted below.  Modified ESAS Completed by: provider   Fatigue: 0 Drowsiness: 0   Depression: 0 Pain: 5   Anxiety: 4 Nausea: 0   Anorexia: 0 Dyspnea: 0                    PHYSICAL EXAM:     From RN flowsheet:  Wt Readings from Last 3 Encounters:   02/16/17 166 lb 14.2 oz (75.7 kg)     Blood pressure 199/90, pulse (!) 45, temperature 98.4 ??F (36.9 ??C), resp. rate 16, height 5' 1"  (1.549 m), weight 166 lb 14.2 oz (75.7 kg), SpO2 96 %, not currently breastfeeding.    Pain Scale 1: Numeric (0 - 10)  Pain Intensity 1: 8  Pain Onset 1: acute  Pain Location 1: Face  Pain Orientation 1: Left  Pain Description 1: Burning  Pain Intervention(s) 1: Medication (see MAR)  Last bowel movement, if known:     Constitutional: alert, conversant, nad  Eyes: pupils equal, anicteric  ENMT: no nasal discharge, moist mucous membranes  Cardiovascular:   Respiratory: breathing not labored, symmetric  Gastrointestinal: soft non-tender, +bowel sounds  Musculoskeletal: no deformity, no tenderness to palpation  Skin: warm, dry, areas of erythema below chin, palpable cyst  Neurologic: following commands, moving all extremities  Psychiatric: full affect, no hallucinations  Other:       HISTORY:     Active Problems:    Facial cellulitis (02/09/2017)      Past Medical History:   Diagnosis Date   ??? ADHD    ??? PCOS (polycystic ovarian syndrome)       Past Surgical History:   Procedure Laterality Date   ??? HX APPENDECTOMY     ??? IR CHOLECYSTOSTOMY PERCUTANEOUS        History reviewed. No pertinent family history.   History reviewed, no pertinent family history.  Social History     Tobacco Use   ??? Smoking status: Current Every Day Smoker    ??? Smokeless tobacco: Never Used   Substance Use Topics   ??? Alcohol use: No     Frequency: Never     Allergies   Allergen Reactions   ??? Percocet [Oxycodone-Acetaminophen] Itching      Current Facility-Administered Medications   Medication Dose Route Frequency   ??? HYDROmorphone (DILAUDID) injection 1 mg  1 mg IntraVENous Q3H PRN   ??? HYDROmorphone (DILAUDID) tablet 4 mg  4 mg Oral Q4H PRN   ??? predniSONE (DELTASONE) tablet 40 mg  40 mg Oral DAILY WITH BREAKFAST   ??? acetaminophen (TYLENOL) tablet 650 mg  650 mg Oral Q6H PRN   ??? lidocaine (XYLOCAINE) 2 % jelly   Topical PRN   ??? vancomycin (VANCOCIN) 750 mg in 0.9% sodium chloride 250 mL (Vial2Bag)  750 mg IntraVENous Q8H   ??? senna-docusate (PERICOLACE) 8.6-50 mg per tablet 2 Tab  2 Tab Oral DAILY   ??? albuterol-ipratropium (DUO-NEB) 2.5 MG-0.5 MG/3 ML  3 mL Nebulization Q6H PRN   ??? ondansetron (ZOFRAN) injection 4 mg  4 mg IntraVENous Q8H PRN   ??? hydrALAZINE (APRESOLINE) 20 mg/mL injection 5 mg  5 mg IntraVENous Q6H PRN   ??? LORazepam (ATIVAN) injection 0.5 mg  0.5 mg IntraVENous Q8H PRN   ??? dextroamphetamine-amphetamine (ADDERALL) tablet 20 mg  20 mg Oral BID   ??? Vancomycin Pharmacy dosing   Other Rx Dosing/Monitoring   ??? sodium chloride (NS) flush 5-40 mL  5-40 mL IntraVENous Q8H   ??? sodium chloride (NS) flush 5-40 mL  5-40 mL IntraVENous PRN   ??? enoxaparin (LOVENOX) injection 40 mg  40 mg SubCUTAneous Q24H   ??? piperacillin-tazobactam (ZOSYN) 3.375 g in 0.9% sodium chloride (MBP/ADV) 100 mL  3.375 g IntraVENous Q8H          LAB AND IMAGING FINDINGS:     Lab Results   Component Value Date/Time    WBC 13.5 (H) 02/16/2017 03:26 AM    HGB 11.0 (L) 02/16/2017 03:26 AM    PLATELET 269 02/16/2017 03:26 AM     Lab Results   Component Value Date/Time    Sodium 137 02/16/2017 03:26 AM    Potassium 3.5 02/16/2017 03:26 AM    Chloride 104 02/16/2017 03:26 AM    CO2 27 02/16/2017 03:26 AM    BUN 9 02/16/2017 03:26 AM    Creatinine 0.73 02/16/2017 03:26 AM     Calcium 8.2 (L) 02/16/2017 03:26 AM    Magnesium 1.9 02/15/2017 04:38 AM      Lab Results   Component Value Date/Time    AST (SGOT) 12 (L) 02/10/2017 02:04 AM    Alk. phosphatase 55 02/10/2017 02:04 AM    Protein, total 6.4 02/10/2017 02:04 AM    Albumin 3.0 (L) 02/10/2017 02:04 AM    Globulin 3.4 02/10/2017 02:04 AM     No results found for: INR, PTMR, PTP, PT1, PT2, APTT   No results found for: IRON, FE, TIBC, IBCT, PSAT, FERR   No results found for: PH, PCO2, PO2  No components found for: GLPOC   No results found for: CPK, CKMB             Total time: 70 minutes  Counseling / coordination time, spent as noted above: 45 minutes  > 50% counseling / coordination?: y    Prolonged service was provided for  [] 30 min   [] 75 min in face to face time in the presence of the patient, spent as noted above.  Time Start:   Time End:   Note: this can only be billed with [phone] (initial) or [phone] (follow up).  If multiple start / stop times, list each separately.

## 2017-02-16 NOTE — Progress Notes (Signed)
Music Therapy Assessment    Leala Bryand [id number]  [id number]    1980/06/15  37 y.o.  female    Patient Telephone Number: [phone] (home)   Religious Affiliation: Catholic   Language: English   Extended Emergency Contact Information  Primary Emergency Contact: tranae, laramie  Mobile Phone: [phone]  Relation: Spouse   Patient Active Problem List    Diagnosis Date Noted   ??? Facial cellulitis 02/09/2017        Date: 02/16/2017       Mental Status:   [  ] Alert [  ] Forgetful [  ]  Confused  [  ] Minimally responsive-N/A: Please see Session Observations below.    Communication Status: [  ] Impaired Speech [  ] Nonverbal -N/A    Physical Status:   [  ] Oxygen in use  [  ] Hard of Hearing [  ] Vision Impaired  [  ] Ambulatory  [  ] Ambulatory with assistance [  ] Non-ambulatory -N/A    Music Preferences, Background: Pt's spouse said pt likes music from most genres. She specifically named soulful singers such as Westly Pam and "[id number] bands."    Clinical Problem to be addressed: Support healthy pt/family coping.    Goal(s) met in session:  Physical/Pain management (Scale of 1-10):    Pre-session rating: N/A: Please see Session Observations below.  Post-session rating: N/A: Please see Session Observations below.  [  ] Increased relaxation   [  ] Regulated breathing patterns  [  ] Decreased muscle tension   [  ] Minimized physical distress     Emotional/Psychological:  [  ] Increased self-expression   [  ] Decreased aggressive behavior   [  ] Decreased sadness   [  ] Discussed healthy coping skills     [  ] Improved mood    [  ] Decreased withdrawn behavior     Social:  [  ] Decreased feelings of isolation/loneliness [  ] Positive social interaction   _0  Provided support and/or comfort for family/friends    Spiritual:  [  ] Spiritual support    [  ] Expressed peace  [  ] Expressed faith    [  ] Discussed beliefs    Techniques Utilized (Check all that apply):    [  ] Procedural support MT [  ] Music for relaxation [  ] Patient preferred music  [  ] Lyric analysis  [  ] Song choice  [  ] Music for validation  [  ] Entrainment  [  ] Progressive muscle relax. [  ] Guided visualization  [  ] ISO Principle  [  ] Patient instrument playing [  ] Song writing  [  ] Sing along   [  ] Improvisation  [  ] Sensory stimulation  _1  Active Listening  [  ] Music for spiritual support [  ] Making of CDs as gifts    Session Observations:  Referral from Ashley, [place]. Patient (pt) was not in the room but her spouse Larene Beach and father Richardson Landry were present. This music therapist (MT) introduced self. Pt's spouse shared that pt was currently off the unit in surgery. She expressed openness to music therapy and asked if the MT could follow up to offer this another day when pt would be available. She said the pt has had a tough past few days and loves music. MT asked her about the  pt's music preferences and she and pt's father shared these. They expressed gratitude for MT's visit. Will follow as able.    Mindi Slicker, MT-BC (Music Therapist-Board Certified)  Spiritual Care Department  Referral-based service

## 2017-02-16 NOTE — Anesthesia Pre-Procedure Evaluation (Signed)
Formatting of this note is different from the original.  Anesthetic History   No history of anesthetic complications        Review of Systems / Medical History  Patient summary reviewed, nursing notes reviewed and pertinent labs reviewed    Pulmonary    Smoker     Neuro/Psych     Psychiatric history     Cardiovascular  Within defined limits    Exercise tolerance: >4 METS    GI/Hepatic/Renal  Within defined limits      Endo/Other  Within defined limits     Other Findings   Comments: Neck abscess  PCOS     Physical Exam    Airway  Mallampati: II  TM Distance: > 6 cm  Neck ROM: normal range of motion   Mouth opening: Diminished (comment)    Comments: Mouth diminished due to pain Cardiovascular    Rhythm: regular  Rate: normal     Dental    Dentition: Lower dentition intact and Upper dentition intact    Pulmonary  Breath sounds clear to auscultation     Abdominal  GI exam deferred     Other Findings       Anesthetic Plan    ASA: 2  Anesthesia type: general    Induction: Intravenous  Anesthetic plan and risks discussed with: Patient        Electronically signed by Armida Sans, MD at 02/16/2017  3:53 PM EST

## 2017-02-16 NOTE — Consults (Signed)
Palliative Medicine Consult  [place]: [phone])    Patient Name: Christina Stevens  Date of Birth: Apr 14, 1980    Date of Initial Consult: February 17, 2016  Reason for Consult: Pain Management  Requesting Provider: Dr. Lauro Regulus   Primary Care Physician: Sherley Bounds, NP     SUMMARY:   Aliyanna Wassmer is a 37 y.o. with a past history of PCOS, ADHD, MONO, who was admitted on 02/09/2017 from home with a diagnosis of facial cellulitis that progressed into submental abscess formation.  ENT involved with plans for surgical drainage. Current medical issues leading to Palliative Medicine involvement include: support with pain management.    Issues: acute facial and neck cellulitis, leukocytosis, sinus bradycardia, elevated blood pressure       PALLIATIVE DIAGNOSES:   1. Submental Cyst  2. Facial Pain  3. Odynophagia  4. Cellulitis of the face     PLAN:   1. Pt seen with wife   2. Pain regimen discussed in detail.  Plans for I&D soon  3. Explained that once the cyst is drained, pain should improve  4. Agree with current meds  5. Pt says the oxycodone does not do much at all but the iv dilaudid works well  6. Pt npo so continue with the iv for the next 24 hours given possibility of post op discomfort  7. Will change oxy ir to oral dilaudid given its ineffective and to have something oral available to transition off the iv  8. Will follow up tomorrow  9. Initial consult note routed to primary continuity provider  10. Communicated plan of care with: Palliative IDT       GOALS OF CARE / TREATMENT PREFERENCES:     GOALS OF CARE:  Patient/Health Care Proxy Stated Goals: Cure      TREATMENT PREFERENCES:   Code Status: Full Code    Advance Care Planning:  '[x]'  The Pall Med Interdisciplinary Team has updated the ACP Navigator with [place] and Patient Capacity    Primary Decision Maker ([place]): Jerilee Field  Relationship to patient: spouse   Phone [phone]  '[]'  Named in a scanned document   '[x]'   Legal Next of Kin  '[]'  Guardian    Naval architect ([employer] [place]):   Relationship to patient:  Phone number:  '[]'  Named in a scanned document   '[]'  Legal Next of Kin  '[]'  Guardian    Medical Interventions: Full interventions   Other Instructions:   Artificially Administered Nutrition: No feeding tube     Other:    As far as possible, the palliative care team has discussed with patient / health care proxy about goals of care / treatment preferences for patient.     HISTORY:     History obtained from: chart    CHIEF COMPLAINT: pain below chin    HPI/SUBJECTIVE:    The patient is:   '[x]'  Verbal and participatory  '[]'  Non-participatory due to:   Pain below chin from cyst, facial burning, and painful swallow at times     Clinical Pain Assessment (nonverbal scale for severity on nonverbal patients):   Clinical Pain Assessment  Severity: 5  Location: submental area  Character: facial buring and pressure with unusual swallowing  Duration: week  Factors: iv dilaudid is helping  Frequency: all the time          Duration: for how long has pt been experiencing pain (e.g., 2 days, 1 month, years)  Frequency: how often pain is an issue (e.g., several times per day, once every few days, constant)     FUNCTIONAL ASSESSMENT:     Palliative Performance Scale (PPS):  PPS: 100       PSYCHOSOCIAL/SPIRITUAL SCREENING:     Palliative IDT has assessed this patient for cultural preferences / practices and a referral made as appropriate to needs Orthoptist, Patient Advocacy, Ethics, etc.)    Any spiritual / religious concerns:  '[]'  Yes /  '[x]'  No    Caregiver Burnout:  '[]'  Yes /  '[x]'  No /  '[]'  No Caregiver Present      Anticipatory grief assessment:   '[x]'  Normal  / '[]'  Maladaptive       ESAS Anxiety: Anxiety: 4    ESAS Depression: Depression: 0        REVIEW OF SYSTEMS:     Positive and pertinent negative findings in ROS are noted above in HPI.  The following systems were '[x]'  reviewed / '[]'  unable to be reviewed as  noted in HPI  Other findings are noted below.  Systems: constitutional, ears/nose/mouth/throat, respiratory, gastrointestinal, genitourinary, musculoskeletal, integumentary, neurologic, psychiatric, endocrine. Positive findings noted below.  Modified ESAS Completed by: provider   Fatigue: 0 Drowsiness: 0   Depression: 0 Pain: 5   Anxiety: 4 Nausea: 0   Anorexia: 0 Dyspnea: 0                    PHYSICAL EXAM:     From RN flowsheet:  Wt Readings from Last 3 Encounters:   02/16/17 166 lb 14.2 oz (75.7 kg)     Blood pressure 199/90, pulse (!) 45, temperature 98.4 ??F (36.9 ??C), resp. rate 16, height '5\' 1"'  (1.549 m), weight 166 lb 14.2 oz (75.7 kg), SpO2 96 %, not currently breastfeeding.    Pain Scale 1: Numeric (0 - 10)  Pain Intensity 1: 8  Pain Onset 1: acute  Pain Location 1: Face  Pain Orientation 1: Left  Pain Description 1: Burning  Pain Intervention(s) 1: Medication (see MAR)  Last bowel movement, if known:     Constitutional: alert, conversant, nad  Eyes: pupils equal, anicteric  ENMT: no nasal discharge, moist mucous membranes  Cardiovascular:   Respiratory: breathing not labored, symmetric  Gastrointestinal: soft non-tender, +bowel sounds  Musculoskeletal: no deformity, no tenderness to palpation  Skin: warm, dry, areas of erythema below chin, palpable cyst  Neurologic: following commands, moving all extremities  Psychiatric: full affect, no hallucinations  Other:       HISTORY:     Active Problems:    Facial cellulitis (02/09/2017)      Past Medical History:   Diagnosis Date   ??? ADHD    ??? PCOS (polycystic ovarian syndrome)       Past Surgical History:   Procedure Laterality Date   ??? HX APPENDECTOMY     ??? IR CHOLECYSTOSTOMY PERCUTANEOUS        History reviewed. No pertinent family history.   History reviewed, no pertinent family history.  Social History     Tobacco Use   ??? Smoking status: Current Every Day Smoker   ??? Smokeless tobacco: Never Used   Substance Use Topics   ??? Alcohol use: No     Frequency: Never      Allergies   Allergen Reactions   ??? Percocet [Oxycodone-Acetaminophen] Itching      Current Facility-Administered Medications   Medication Dose Route Frequency   ??? HYDROmorphone (DILAUDID) injection  1 mg  1 mg IntraVENous Q3H PRN   ??? HYDROmorphone (DILAUDID) tablet 4 mg  4 mg Oral Q4H PRN   ??? predniSONE (DELTASONE) tablet 40 mg  40 mg Oral DAILY WITH BREAKFAST   ??? acetaminophen (TYLENOL) tablet 650 mg  650 mg Oral Q6H PRN   ??? lidocaine (XYLOCAINE) 2 % jelly   Topical PRN   ??? vancomycin (VANCOCIN) 750 mg in 0.9% sodium chloride 250 mL (Vial2Bag)  750 mg IntraVENous Q8H   ??? senna-docusate (PERICOLACE) 8.6-50 mg per tablet 2 Tab  2 Tab Oral DAILY   ??? albuterol-ipratropium (DUO-NEB) 2.5 MG-0.5 MG/3 ML  3 mL Nebulization Q6H PRN   ??? ondansetron (ZOFRAN) injection 4 mg  4 mg IntraVENous Q8H PRN   ??? hydrALAZINE (APRESOLINE) 20 mg/mL injection 5 mg  5 mg IntraVENous Q6H PRN   ??? LORazepam (ATIVAN) injection 0.5 mg  0.5 mg IntraVENous Q8H PRN   ??? dextroamphetamine-amphetamine (ADDERALL) tablet 20 mg  20 mg Oral BID   ??? Vancomycin Pharmacy dosing   Other Rx Dosing/Monitoring   ??? sodium chloride (NS) flush 5-40 mL  5-40 mL IntraVENous Q8H   ??? sodium chloride (NS) flush 5-40 mL  5-40 mL IntraVENous PRN   ??? enoxaparin (LOVENOX) injection 40 mg  40 mg SubCUTAneous Q24H   ??? piperacillin-tazobactam (ZOSYN) 3.375 g in 0.9% sodium chloride (MBP/ADV) 100 mL  3.375 g IntraVENous Q8H          LAB AND IMAGING FINDINGS:     Lab Results   Component Value Date/Time    WBC 13.5 (H) 02/16/2017 03:26 AM    HGB 11.0 (L) 02/16/2017 03:26 AM    PLATELET 269 02/16/2017 03:26 AM     Lab Results   Component Value Date/Time    Sodium 137 02/16/2017 03:26 AM    Potassium 3.5 02/16/2017 03:26 AM    Chloride 104 02/16/2017 03:26 AM    CO2 27 02/16/2017 03:26 AM    BUN 9 02/16/2017 03:26 AM    Creatinine 0.73 02/16/2017 03:26 AM    Calcium 8.2 (L) 02/16/2017 03:26 AM    Magnesium 1.9 02/15/2017 04:38 AM      Lab Results   Component Value Date/Time     AST (SGOT) 12 (L) 02/10/2017 02:04 AM    Alk. phosphatase 55 02/10/2017 02:04 AM    Protein, total 6.4 02/10/2017 02:04 AM    Albumin 3.0 (L) 02/10/2017 02:04 AM    Globulin 3.4 02/10/2017 02:04 AM     No results found for: INR, PTMR, PTP, PT1, PT2, APTT   No results found for: IRON, FE, TIBC, IBCT, PSAT, FERR   No results found for: PH, PCO2, PO2  No components found for: GLPOC   No results found for: CPK, CKMB             Total time: 70 minutes  Counseling / coordination time, spent as noted above: 45 minutes  > 50% counseling / coordination?: y    Prolonged service was provided for  '[]' 30 min   '[]' 75 min in face to face time in the presence of the patient, spent as noted above.  Time Start:   Time End:   Note: this can only be billed with [phone] (initial) or [phone] (follow up).  If multiple start / stop times, list each separately.

## 2017-02-17 LAB — CBC WITH AUTOMATED DIFF
ABS. BASOPHILS: 0 10*3/uL (ref 0.0–0.1)
ABS. EOSINOPHILS: 0.1 10*3/uL (ref 0.0–0.4)
ABS. IMM. GRANS.: 0.2 10*3/uL — ABNORMAL HIGH (ref 0.00–0.04)
ABS. LYMPHOCYTES: 5.1 10*3/uL — ABNORMAL HIGH (ref 0.8–3.5)
ABS. MONOCYTES: 1.1 10*3/uL — ABNORMAL HIGH (ref 0.0–1.0)
ABS. NEUTROPHILS: 11.4 10*3/uL — ABNORMAL HIGH (ref 1.8–8.0)
ABSOLUTE NRBC: 0 10*3/uL (ref 0.00–0.01)
BASOPHILS: 0 % (ref 0–1)
EOSINOPHILS: 0 % (ref 0–7)
HCT: 35.9 % (ref 35.0–47.0)
HGB: 12.2 g/dL (ref 11.5–16.0)
IMMATURE GRANULOCYTES: 1 % — ABNORMAL HIGH (ref 0.0–0.5)
LYMPHOCYTES: 29 % (ref 12–49)
MCH: 32.2 PG (ref 26.0–34.0)
MCHC: 34 g/dL (ref 30.0–36.5)
MCV: 94.7 FL (ref 80.0–99.0)
MONOCYTES: 6 % (ref 5–13)
MPV: 10.1 FL (ref 8.9–12.9)
NEUTROPHILS: 64 % (ref 32–75)
NRBC: 0 PER 100 WBC
PLATELET: 294 10*3/uL (ref 150–400)
RBC: 3.79 M/uL — ABNORMAL LOW (ref 3.80–5.20)
RDW: 11.9 % (ref 11.5–14.5)
WBC: 17.8 10*3/uL — ABNORMAL HIGH (ref 3.6–11.0)

## 2017-02-17 LAB — METABOLIC PANEL, BASIC
Anion gap: 8 mmol/L (ref 5–15)
BUN/Creatinine ratio: 11 — ABNORMAL LOW (ref 12–20)
BUN: 10 MG/DL (ref 6–20)
CO2: 28 mmol/L (ref 21–32)
Calcium: 8.3 MG/DL — ABNORMAL LOW (ref 8.5–10.1)
Chloride: 103 mmol/L (ref 97–108)
Creatinine: 0.92 MG/DL (ref 0.55–1.02)
GFR est AA: >60 mL/min/{1.73_m2} (ref >60)
GFR est non-AA: >60 mL/min/{1.73_m2} (ref >60)
Glucose: 117 mg/dL — ABNORMAL HIGH (ref 65–100)
Potassium: 3.2 mmol/L — ABNORMAL LOW (ref 3.5–5.1)
Sodium: 139 mmol/L (ref 136–145)

## 2017-02-17 LAB — HCG QL SERUM: HCG, Ql.: NEGATIVE

## 2017-02-17 LAB — CULTURE, MRSA

## 2017-02-17 MED ORDER — NYSTATIN 100,000 UNIT/ML ORAL SUSP
100000 unit/mL | Freq: Four times a day (QID) | ORAL | Status: DC
Start: 2017-02-17 — End: 2017-02-20
  Administered 2017-02-17 – 2017-02-20 (×13): via ORAL

## 2017-02-17 MED ORDER — POTASSIUM CHLORIDE SR 10 MEQ TAB
10 mEq | Freq: Every day | ORAL | Status: DC
Start: 2017-02-17 — End: 2017-02-20
  Administered 2017-02-17 – 2017-02-20 (×4): via ORAL

## 2017-02-17 MED FILL — LORAZEPAM 2 MG/ML IJ SOLN: 2 mg/mL | INTRAMUSCULAR | Qty: 1

## 2017-02-17 MED FILL — GLYCOPYRROLATE 0.2 MG/ML IJ SOLN: 0.2 mg/mL | INTRAMUSCULAR | Qty: 0.2

## 2017-02-17 MED FILL — HYDROMORPHONE 2 MG/ML INJECTION SOLUTION: 2 mg/mL | INTRAMUSCULAR | Qty: 1

## 2017-02-17 MED FILL — NEOSTIGMINE METHYLSULFATE 1 MG/ML INJECTION: 1 mg/mL | INTRAMUSCULAR | Qty: 1

## 2017-02-17 MED FILL — VANCOMYCIN 750 MG IV SOLUTION: 750 mg | INTRAVENOUS | Qty: 750

## 2017-02-17 MED FILL — PREDNISONE 20 MG TAB: 20 mg | ORAL | Qty: 2

## 2017-02-17 MED FILL — PIPERACILLIN-TAZOBACTAM 3.375 GRAM IV SOLR: 3.375 gram | INTRAVENOUS | Qty: 3.38

## 2017-02-17 MED FILL — NORMAL SALINE FLUSH 0.9 % INJECTION SYRINGE: INTRAMUSCULAR | Qty: 20

## 2017-02-17 MED FILL — XYLOCAINE-MPF 20 MG/ML (2 %) INJECTION SOLUTION: 20 mg/mL (2 %) | INTRAMUSCULAR | Qty: 100

## 2017-02-17 MED FILL — NYSTATIN 100,000 UNIT/ML ORAL SUSP: 100000 unit/mL | ORAL | Qty: 5

## 2017-02-17 MED FILL — K-TAB 10 MEQ TABLET,EXTENDED RELEASE: 10 mEq | ORAL | Qty: 4

## 2017-02-17 MED FILL — ROCURONIUM 10 MG/ML IV: 10 mg/mL | INTRAVENOUS | Qty: 10

## 2017-02-17 MED FILL — AMPHETAMINE-DEXTROAMPHETAMINE 10 MG TAB: 10 mg | ORAL | Qty: 2

## 2017-02-17 MED FILL — ONDANSETRON (PF) 4 MG/2 ML INJECTION: 4 mg/2 mL | INTRAMUSCULAR | Qty: 2

## 2017-02-17 MED FILL — SENOKOT-S 8.6 MG-50 MG TABLET: ORAL | Qty: 2

## 2017-02-17 MED FILL — ENOXAPARIN 40 MG/0.4 ML SUB-Q SYRINGE: 40 mg/0.4 mL | SUBCUTANEOUS | Qty: 0.4

## 2017-02-17 MED FILL — HYDROMORPHONE 2 MG TAB: 2 mg | ORAL | Qty: 2

## 2017-02-17 MED FILL — DIPRIVAN 10 MG/ML INTRAVENOUS EMULSION: 10 mg/mL | INTRAVENOUS | Qty: 20

## 2017-02-17 MED FILL — SUCCINYLCHOLINE CHLORIDE 20 MG/ML INJECTION: 20 mg/mL | INTRAMUSCULAR | Qty: 200

## 2017-02-17 NOTE — Progress Notes (Addendum)
1930: Bedside shift change report given to Christina Ridgelaylor, RN (oncoming nurse) by Zollie Scalelivia, RN (offgoing nurse). Report included the following information SBAR, Kardex, Intake/Output, MAR, Recent Results, Cardiac Rhythm SB and Alarm Parameters .     Problem: Cellulitis Care Plan (Adult)  Goal: *Control of acute pain  Outcome: Progressing Towards Goal  Pt receiving IV dilaudid and refusing PO dilaudid, stating it upsets her stomach. Pt states that IV dilaudid helps with pain and brings it from a 9 to a 4-5. Will continue to monitor  Goal: *Absence of infection signs and symptoms  Outcome: Progressing Towards Goal  Pt receiving IV antibiotics and nystatin for thrush. Daily wound care dressing changes. Will continue to monitor    Problem: Falls - Risk of  Goal: *Absence of Falls  Document Schmid Fall Risk and appropriate interventions in the flowsheet.  Outcome: Progressing Towards Goal  Fall Risk Interventions:  Mobility Interventions: Communicate number of staff needed for ambulation/transfer, Patient to call before getting OOB         Medication Interventions: Assess postural VS orthostatic hypotension, Evaluate medications/consider consulting pharmacy, Teach patient to arise slowly, Patient to call before getting OOB    Elimination Interventions: Call light in reach, Patient to call for help with toileting needs             Problem: Pressure Injury - Risk of  Goal: *Prevention of pressure injury  Document Braden Scale and appropriate interventions in the flowsheet.  Outcome: Progressing Towards Goal  Pressure Injury Interventions:  Sensory Interventions: Assess changes in LOC, Keep linens dry and wrinkle-free, Minimize linen layers    Moisture Interventions: Minimize layers    Activity Interventions: Increase time out of bed, Pressure redistribution bed/mattress(bed type), PT/OT evaluation    Mobility Interventions: HOB 30 degrees or less, Pressure redistribution bed/mattress (bed type), PT/OT evaluation     Nutrition Interventions: Document food/fluid/supplement intake    Friction and Shear Interventions: HOB 30 degrees or less, Lift sheet, Minimize layers

## 2017-02-17 NOTE — Progress Notes (Addendum)
Hospitalist Progress Note  Christina Labrador, MD  Answering service: [phone] OR [phone] from in house phone        Date of Service:  02/17/2017  NAME:  Christina Stevens  DOB:  18-Sep-1980  MRN:  [id number]      Admission Summary:   37 y.o F with PMH of ADHD,PCOS was admitted due to facial cellulitis.  Patient states that she was diagnosed with mononucleosis at her PCP office and was prescribed amoxicillin and steroids few weeks ago.Later she had cut herself while shaving and she continued to use amoxicillin for nearly 14 days. She also mentioned about going to [facility].  Again,she cut herself again while shaving and since then she noticed swelling,difficult to open her mouth and breathing issues.      Interval history / Subjective:   Patient says she has now oral thrush an some pain from there I and D area.  I asked her if the ID doc could come back,she answered "there is no need for him to come back".I explained to her he was assisting [place] with antibiotics  Etc,she insisted she does not want him back.     Assessment & Plan:     #Facial and neck cellulitis:  -she sustained a nick  while shaving her face.  -CT neck on admission did not show any abscess but USG showed soft tissue swelling  -on IV vanc and zosyn  -ENT and ID following  -CT neck on 02/11/2017 showed fluid collection under chin,per Dr.kerr it is phlegmon and not abscess yet.  -CT on 1/13 showed non drain able fluid collection in the submental region,reactive lymph nodes.  -As she had symptoms of hyperactive delirium(was very anxious,chest discomfort,hyperventilation,was crying intermittently) while on IV decadron 8 mg Q 8hr it was stopped on 1/13.Started on prednisone on 1/15 as patient  Confirmed with mer and Dr Sharl Ma that she did not have any problem with prednisone in the past and wants to try it.  -monitor vanc levels  -CT 1/16 showed abscess formation   -1/16: underwent I and D.Gram stain negative,cultures pending    #oral thrush: diflucan and nystatin    #hypokalemia: replacement ordered.    #Sinus bradycardia:asymptomatic  -TSH and T4 normal  -TTE unremarkable  -Suspected to be from high vagal tone.    #Elevated blood pressure:improving  -she does not have a diagnosis of HTN.It is likely due to steroids,fluids, and emotional distress  -prn hydralazine if symptomatic.  -D/c iv fluids.      Leukocytosis:  -likely due to steroids as cellulitis is improving.  -WBC close to normal     Morbid obesity:  Body mass index is 31.28 kg/m??.  -life style changes and weight loss recommended      #History of ADHD:  -on 20 mg adderall BID -confirmed dose from her pharmacy- resumed    "As she had chest pain and resp distress on 1/13 CT angio chest done which showed questionable filling defect  in the subsegmental branch of the right lower lobe pulmonary artery. However, given the degree of respiratory motion, definitive right lower lobe pulmonary embolus cannot be confirmed. She has been on lovenox since admission, it a subsegmental filling defect  -I  think she does not need therapeutic anticoagulation. Her resp distress is attributed to anxiety due to steroids."  -1/15:patient remained without shortness of breath,chest pain.SPo2 100% on room air,no tachycardia.No clinical suspicion for PE.Continue to monitor ,however,will have low threshold to scan her lung if any  respiratory symptoms develop.    Code status:Full  DVT prophylaxis: lovenox     Hospital Problems  Date Reviewed: 02/16/2017          Codes Class Noted POA    Facial cellulitis ICD-10-CM: L03.211  ICD-9-CM: 682.0  02/09/2017 Unknown                Review of Systems:   Pertinent items are noted in HPI.       Vital Signs:    Last 24hrs VS reviewed since prior progress note. Most recent are:  Visit Vitals  BP 102/53 (BP 1 Location: Left arm, BP Patient Position: At rest)   Pulse 73   Temp 98.6 ??F (37 ??C)   Resp 15    Ht 5\' 1"  (1.549 m)   Wt 75.1 kg (165 lb 9.1 oz)   SpO2 97%   Breastfeeding? No   BMI 31.28 kg/m??         Intake/Output Summary (Last 24 hours) at 02/17/2017 0920  Last data filed at 02/16/2017 1641  Gross per 24 hour   Intake 300 ml   Output 5 ml   Net 295 ml        Physical Examination:             Constitutional:  No acute distress, cooperative, pleasant??   ENT:  Oral mucous moist, no dental caries  -dressing on the under side of chin.     Resp:  CTA bilaterally. No wheezing/rhonchi/rales. No accessory muscle use   CV:  Regular rhythm, normal rate, no murmurs, gallops, rubs    GI:  Soft, non distended, non tender. normoactive bowel sounds, no hepatosplenomegaly     Musculoskeletal:  trace edema, warm, 2+ pulses throughout    Neurologic:  Moves all extremities.  AAOx3            Data Review:    Review and/or order of clinical lab test  Review and/or order of tests in the medicine section of CPT      Labs:     Recent Labs     02/17/17  0521 02/16/17  0326   WBC 17.8* 13.5*   HGB 12.2 11.0*   HCT 35.9 31.8*   PLT 294 269     Recent Labs     02/17/17  0521 02/16/17  0326 02/15/17  2015 02/15/17  0438   NA 139 137 138 139   K 3.2* 3.5 3.9 3.6   CL 103 104 107 107   CO2 28 27 24 26    BUN 10 9 11 15    CREA 0.92 0.73 0.85 1.01   GLU 117* 112* 144* 135*   CA 8.3* 8.2* 7.9* 7.7*   MG  --   --   --  1.9     No results for input(s): SGOT, GPT, ALT, AP, TBIL, TBILI, TP, ALB, GLOB, GGT, AML, LPSE in the last 72 hours.    No lab exists for component: AMYP, HLPSE  No results for input(s): INR, PTP, APTT in the last 72 hours.    No lab exists for component: INREXT, INREXT   No results for input(s): FE, TIBC, PSAT, FERR in the last 72 hours.   No results found for: FOL, RBCF   No results for input(s): PH, PCO2, PO2 in the last 72 hours.  No results for input(s): CPK, CKNDX, TROIQ in the last 72 hours.    No lab exists for component: CPKMB  No results found for: CHOL,  CHOLX, CHLST, CHOLV, HDL, LDL, LDLC, DLDLP,  TGLX, TRIGL, TRIGP, CHHD, CHHDX  Lab Results   Component Value Date/Time    Glucose (POC) 126 (H) 02/13/2017 03:57 PM     Lab Results   Component Value Date/Time    Color YELLOW/STRAW 02/09/2017 07:34 PM    Appearance CLEAR 02/09/2017 07:34 PM    Specific gravity 1.010 02/09/2017 07:34 PM    pH (UA) 6.0 02/09/2017 07:34 PM    Protein NEGATIVE  02/09/2017 07:34 PM    Glucose NEGATIVE  02/09/2017 07:34 PM    Ketone NEGATIVE  02/09/2017 07:34 PM    Bilirubin NEGATIVE  02/09/2017 07:34 PM    Urobilinogen 0.2 02/09/2017 07:34 PM    Nitrites NEGATIVE  02/09/2017 07:34 PM    Leukocyte Esterase NEGATIVE  02/09/2017 07:34 PM    Epithelial cells MODERATE (A) 02/09/2017 07:34 PM    Bacteria NEGATIVE  02/09/2017 07:34 PM    WBC 0-4 02/09/2017 07:34 PM    RBC 0-5 02/09/2017 07:34 PM         Medications Reviewed:     Current Facility-Administered Medications   Medication Dose Route Frequency   ??? potassium chloride SR (KLOR-CON 10) tablet 40 mEq  40 mEq Oral DAILY   ??? nystatin (MYCOSTATIN) 100,000 unit/mL oral suspension 500,000 Units  500,000 Units Oral QID   ??? HYDROmorphone (DILAUDID) injection 1 mg  1 mg IntraVENous Q3H PRN   ??? HYDROmorphone (DILAUDID) tablet 4 mg  4 mg Oral Q4H PRN   ??? predniSONE (DELTASONE) tablet 40 mg  40 mg Oral DAILY WITH BREAKFAST   ??? acetaminophen (TYLENOL) tablet 650 mg  650 mg Oral Q6H PRN   ??? lidocaine (XYLOCAINE) 2 % jelly   Topical PRN   ??? vancomycin (VANCOCIN) 750 mg in 0.9% sodium chloride 250 mL (Vial2Bag)  750 mg IntraVENous Q8H   ??? senna-docusate (PERICOLACE) 8.6-50 mg per tablet 2 Tab  2 Tab Oral DAILY   ??? albuterol-ipratropium (DUO-NEB) 2.5 MG-0.5 MG/3 ML  3 mL Nebulization Q6H PRN   ??? ondansetron (ZOFRAN) injection 4 mg  4 mg IntraVENous Q8H PRN   ??? hydrALAZINE (APRESOLINE) 20 mg/mL injection 5 mg  5 mg IntraVENous Q6H PRN   ??? LORazepam (ATIVAN) injection 0.5 mg  0.5 mg IntraVENous Q8H PRN   ??? dextroamphetamine-amphetamine (ADDERALL) tablet 20 mg  20 mg Oral BID    ??? Vancomycin Pharmacy dosing   Other Rx Dosing/Monitoring   ??? sodium chloride (NS) flush 5-40 mL  5-40 mL IntraVENous Q8H   ??? sodium chloride (NS) flush 5-40 mL  5-40 mL IntraVENous PRN   ??? enoxaparin (LOVENOX) injection 40 mg  40 mg SubCUTAneous Q24H   ??? piperacillin-tazobactam (ZOSYN) 3.375 g in 0.9% sodium chloride (MBP/ADV) 100 mL  3.375 g IntraVENous Q8H     ______________________________________________________________________  EXPECTED LENGTH OF STAY: 3d 7h  ACTUAL LENGTH OF STAY:          8                 Khian Remo Zollie Beckers, MD

## 2017-02-17 NOTE — Progress Notes (Signed)
Palliative Medicine Consult  [place]: [phone])  Patient Name: Christina Stevens  Date of Birth: 09-09-1980    Date of Initial Consult: February 17, 2016  Reason for Consult: Pain Management  Requesting Provider: Dr. Lauro Regulus   Primary Care Physician: Sherley Bounds, NP     SUMMARY:   Christina Stevens is a 37 y.o. with a past history of PCOS, ADHD, MONO, who was admitted on 02/09/2017 from home with a diagnosis of facial cellulitis that progressed into submental abscess formation.  ENT involved with plans for surgical drainage. Current medical issues leading to Palliative Medicine involvement include: support with pain management.    Issues: acute facial and neck cellulitis, leukocytosis, sinus bradycardia, elevated blood pressure       PALLIATIVE DIAGNOSES:   1. Submental Cyst  2. Facial Pain  3. Odynophagia  4. Cellulitis of the face     PLAN:   1. Pt seen without family   2. Pt s/p I&D 02/16/17  3. Having some burning pain in mouth and discomfort from adhesive rmoval placed over sores on face  4. Pain regimen working fine.  Has not tried the oral dilaudid yet  5. Nothing further to add  6. Will sign off. Please call for any additional needs    7. Initial consult note routed to primary continuity provider  8. Communicated plan of care with: Palliative IDT       GOALS OF CARE / TREATMENT PREFERENCES:     GOALS OF CARE:  Patient/Health Care Proxy Stated Goals: Cure      TREATMENT PREFERENCES:   Code Status: Full Code    Advance Care Planning:  [x]  The Pall Med Interdisciplinary Team has updated the ACP Navigator with [place] and Patient Capacity    Primary Decision Maker ([place]): Jerilee Field  Relationship to patient: spouse   Phone number:[phone]  []  Named in a scanned document   [x]  Legal Next of Kin  []  Guardian    Naval architect ([place]):   Relationship to patient:  Phone number:  []  Named in a scanned document   []  Legal Next of Kin  []  Guardian     Medical Interventions: Full interventions   Other Instructions:   Artificially Administered Nutrition: No feeding tube     Other:    As far as possible, the palliative care team has discussed with patient / health care proxy about goals of care / treatment preferences for patient.     HISTORY:     History obtained from: chart    CHIEF COMPLAINT: pain below chin    HPI/SUBJECTIVE:    The patient is:   [x]  Verbal and participatory  []  Non-participatory due to:   Submental discomfort post op with l burning, and painful swallow at times     Clinical Pain Assessment (nonverbal scale for severity on nonverbal patients):   Clinical Pain Assessment  Severity: 4  Location: submental area  Character: facial buring and pressure with unusual swallowing  Duration: week  Factors: iv dilaudid is helping  Frequency: all the time          Duration: for how long has pt been experiencing pain (e.g., 2 days, 1 month, years)  Frequency: how often pain is an issue (e.g., several times per day, once every few days, constant)     FUNCTIONAL ASSESSMENT:     Palliative Performance Scale (PPS):  PPS: 100       PSYCHOSOCIAL/SPIRITUAL SCREENING:  Palliative IDT has assessed this patient for cultural preferences / practices and a referral made as appropriate to needs Orthoptist, Patient Advocacy, Ethics, etc.)    Any spiritual / religious concerns:  []  Yes /  [x]  No    Caregiver Burnout:  []  Yes /  [x]  No /  []  No Caregiver Present      Anticipatory grief assessment:   [x]  Normal  / []  Maladaptive       ESAS Anxiety: Anxiety: 4    ESAS Depression: Depression: 0        REVIEW OF SYSTEMS:     Positive and pertinent negative findings in ROS are noted above in HPI.  The following systems were [x]  reviewed / []  unable to be reviewed as noted in HPI  Other findings are noted below.  Systems: constitutional, ears/nose/mouth/throat, respiratory, gastrointestinal, genitourinary, musculoskeletal, integumentary,  neurologic, psychiatric, endocrine. Positive findings noted below.  Modified ESAS Completed by: provider   Fatigue: 0 Drowsiness: 0   Depression: 0 Pain: 4   Anxiety: 4 Nausea: 0   Anorexia: 0 Dyspnea: 0           Stool Occurrence(s): 1        PHYSICAL EXAM:     From RN flowsheet:  Wt Readings from Last 3 Encounters:   02/17/17 165 lb 9.1 oz (75.1 kg)     Blood pressure 138/88, pulse 68, temperature 98.5 ??F (36.9 ??C), resp. rate 15, height 5' 1"  (1.549 m), weight 165 lb 9.1 oz (75.1 kg), last menstrual period 01/27/2017, SpO2 99 %, not currently breastfeeding.    Pain Scale 1: Numeric (0 - 10)  Pain Intensity 1: 9  Pain Onset 1: (acute)  Pain Location 1: Face, Jaw  Pain Orientation 1: Medial  Pain Description 1: Sharp  Pain Intervention(s) 1: Medication (see MAR)  Last bowel movement, if known:     Constitutional: alert, conversant, nad  Eyes: pupils equal, anicteric  ENMT: no nasal discharge, moist mucous membranes  Cardiovascular:   Respiratory: breathing not labored, symmetric  Gastrointestinal: soft non-tender, +bowel sounds  Musculoskeletal: no deformity, no tenderness to palpation  Skin: warm, dry, clean dressing below chin  Neurologic: following commands, moving all extremities  Psychiatric: full affect, no hallucinations  Other:       HISTORY:     Active Problems:    Facial cellulitis (02/09/2017)      Past Medical History:   Diagnosis Date   ??? ADHD    ??? PCOS (polycystic ovarian syndrome)       Past Surgical History:   Procedure Laterality Date   ??? HX APPENDECTOMY     ??? IR CHOLECYSTOSTOMY PERCUTANEOUS        History reviewed. No pertinent family history.   History reviewed, no pertinent family history.  Social History     Tobacco Use   ??? Smoking status: Current Every Day Smoker   ??? Smokeless tobacco: Never Used   Substance Use Topics   ??? Alcohol use: No     Frequency: Never     Allergies   Allergen Reactions   ??? Percocet [Oxycodone-Acetaminophen] Itching      Current Facility-Administered Medications    Medication Dose Route Frequency   ??? potassium chloride SR (KLOR-CON 10) tablet 40 mEq  40 mEq Oral DAILY   ??? nystatin (MYCOSTATIN) 100,000 unit/mL oral suspension 500,000 Units  500,000 Units Oral QID   ??? HYDROmorphone (DILAUDID) injection 1 mg  1 mg IntraVENous Q3H PRN   ??? HYDROmorphone (  DILAUDID) tablet 4 mg  4 mg Oral Q4H PRN   ??? predniSONE (DELTASONE) tablet 40 mg  40 mg Oral DAILY WITH BREAKFAST   ??? acetaminophen (TYLENOL) tablet 650 mg  650 mg Oral Q6H PRN   ??? lidocaine (XYLOCAINE) 2 % jelly   Topical PRN   ??? vancomycin (VANCOCIN) 750 mg in 0.9% sodium chloride 250 mL (Vial2Bag)  750 mg IntraVENous Q8H   ??? senna-docusate (PERICOLACE) 8.6-50 mg per tablet 2 Tab  2 Tab Oral DAILY   ??? albuterol-ipratropium (DUO-NEB) 2.5 MG-0.5 MG/3 ML  3 mL Nebulization Q6H PRN   ??? ondansetron (ZOFRAN) injection 4 mg  4 mg IntraVENous Q8H PRN   ??? hydrALAZINE (APRESOLINE) 20 mg/mL injection 5 mg  5 mg IntraVENous Q6H PRN   ??? LORazepam (ATIVAN) injection 0.5 mg  0.5 mg IntraVENous Q8H PRN   ??? dextroamphetamine-amphetamine (ADDERALL) tablet 20 mg  20 mg Oral BID   ??? Vancomycin Pharmacy dosing   Other Rx Dosing/Monitoring   ??? sodium chloride (NS) flush 5-40 mL  5-40 mL IntraVENous Q8H   ??? sodium chloride (NS) flush 5-40 mL  5-40 mL IntraVENous PRN   ??? enoxaparin (LOVENOX) injection 40 mg  40 mg SubCUTAneous Q24H   ??? piperacillin-tazobactam (ZOSYN) 3.375 g in 0.9% sodium chloride (MBP/ADV) 100 mL  3.375 g IntraVENous Q8H          LAB AND IMAGING FINDINGS:     Lab Results   Component Value Date/Time    WBC 17.8 (H) 02/17/2017 05:21 AM    HGB 12.2 02/17/2017 05:21 AM    PLATELET 294 02/17/2017 05:21 AM     Lab Results   Component Value Date/Time    Sodium 139 02/17/2017 05:21 AM    Potassium 3.2 (L) 02/17/2017 05:21 AM    Chloride 103 02/17/2017 05:21 AM    CO2 28 02/17/2017 05:21 AM    BUN 10 02/17/2017 05:21 AM    Creatinine 0.92 02/17/2017 05:21 AM    Calcium 8.3 (L) 02/17/2017 05:21 AM    Magnesium 1.9 02/15/2017 04:38 AM       Lab Results   Component Value Date/Time    AST (SGOT) 12 (L) 02/10/2017 02:04 AM    Alk. phosphatase 55 02/10/2017 02:04 AM    Protein, total 6.4 02/10/2017 02:04 AM    Albumin 3.0 (L) 02/10/2017 02:04 AM    Globulin 3.4 02/10/2017 02:04 AM     No results found for: INR, PTMR, PTP, PT1, PT2, APTT   No results found for: IRON, FE, TIBC, IBCT, PSAT, FERR   No results found for: PH, PCO2, PO2  No components found for: GLPOC   No results found for: CPK, CKMB             Total time: 25 minutes  Counseling / coordination time, spent as noted above: 20 minutes  > 50% counseling / coordination?: y    Prolonged service was provided for  [] 30 min   [] 75 min in face to face time in the presence of the patient, spent as noted above.  Time Start:   Time End:   Note: this can only be billed with [phone] (initial) or [phone] (follow up).  If multiple start / stop times, list each separately.

## 2017-02-17 NOTE — Wound Image (Signed)
Wound Consult:  New Patient Visit. Chart reviewed.  Consulted for neck I&D site.  Spoke with patients nurse,  Zollie Scalelivia - she pre-medicated patient with pain medication prior to visit.  Patient is resting on a total care bed; she is independently ambulatory from bed in room.  We used bedside mirror for her to observe wound and packing removal/clipping - she is a bit queasy with site of blood/wound; tomorrow morning, I will show her wife how to pull and trim packing as well as she may be better able to do this than patient.  Used adhesive remover to remove tape - very adhered to skin - wash area - will use silicon dressing today to give skin a break from traditional tape.  Assessment:  Anterior neck - post op day 1 from I&D of abscess;  small ~ 1 cm linear incisional area with 1/2' packing strip visible from wound; did remove ~ 1/2 cm; used saline bullets to try and loosen - as had bleeding with small amount of packing removal from area - held pressure and then re-moistened area with saline to help loosen for next removal - 2x2's/Mepilex Border dressing placed.  Site remains with induration; no surrounding erythema; small scattered areas where she had blistering resurfaced.  Treatment:  As above, saline to moisten packing strip to help remove; trimmed leaving 1 cm tail, 2x2s and Mepilex Border to secure.  Wound Recommendations:  Per Dr. Sharl MaKerr, to remove ~ 1/2 cm of packing daily to two times daily and redress; will work with patient's wife tomorrow a.m.  [facility]; if bleeding occurs please page - [phone].  Continue to monitor nutrition, pain, and skin risk scale, and skin assessment.  Plan:  Will see tomorrow am 0700.  Laruth Bouchardarolyn R Hassan, RN,CWCN    Wound Healing Office [phone]  Pager [phone]

## 2017-02-17 NOTE — Progress Notes (Signed)
POD 1 I&D - Burning in mouth - some white debris in mouth  Wound covered with gauze  OC with white debris buccal mucosa as well assuperficial ulcer tip of tongue 4 mm    Cont IV abx per ID recs through today  Wound care MUST see patient today to work with patient on how to change dressing and pull out 1/2 inch of iodoform gauze one to two times per day  Rec single dose diflucan and also nystatin swish and spit 5 ml po qid for 7 days    Flossie DibbleJulie Ramiya Delahunty MD

## 2017-02-18 LAB — METABOLIC PANEL, BASIC
Anion gap: 5 mmol/L (ref 5–15)
BUN/Creatinine ratio: 17 (ref 12–20)
BUN: 14 MG/DL (ref 6–20)
CO2: 30 mmol/L (ref 21–32)
Calcium: 8.2 MG/DL — ABNORMAL LOW (ref 8.5–10.1)
Chloride: 103 mmol/L (ref 97–108)
Creatinine: 0.82 MG/DL (ref 0.55–1.02)
GFR est AA: >60 mL/min/{1.73_m2} (ref >60)
GFR est non-AA: >60 mL/min/{1.73_m2} (ref >60)
Glucose: 161 mg/dL — ABNORMAL HIGH (ref 65–100)
Potassium: 3.4 mmol/L — ABNORMAL LOW (ref 3.5–5.1)
Sodium: 138 mmol/L (ref 136–145)

## 2017-02-18 LAB — CBC WITH AUTOMATED DIFF
ABS. BASOPHILS: 0 10*3/uL (ref 0.0–0.1)
ABS. EOSINOPHILS: 0 10*3/uL (ref 0.0–0.4)
ABS. IMM. GRANS.: 0.2 10*3/uL — ABNORMAL HIGH (ref 0.00–0.04)
ABS. LYMPHOCYTES: 3.6 10*3/uL — ABNORMAL HIGH (ref 0.8–3.5)
ABS. MONOCYTES: 1.2 10*3/uL — ABNORMAL HIGH (ref 0.0–1.0)
ABS. NEUTROPHILS: 12.6 10*3/uL — ABNORMAL HIGH (ref 1.8–8.0)
ABSOLUTE NRBC: 0 10*3/uL (ref 0.00–0.01)
BASOPHILS: 0 % (ref 0–1)
EOSINOPHILS: 0 % (ref 0–7)
HCT: 36 % (ref 35.0–47.0)
HGB: 12.5 g/dL (ref 11.5–16.0)
IMMATURE GRANULOCYTES: 1 % — ABNORMAL HIGH (ref 0.0–0.5)
LYMPHOCYTES: 20 % (ref 12–49)
MCH: 33.3 PG (ref 26.0–34.0)
MCHC: 34.7 g/dL (ref 30.0–36.5)
MCV: 96 FL (ref 80.0–99.0)
MONOCYTES: 7 % (ref 5–13)
MPV: 10 FL (ref 8.9–12.9)
NEUTROPHILS: 72 % (ref 32–75)
NRBC: 0 PER 100 WBC
PLATELET: 315 10*3/uL (ref 150–400)
RBC: 3.75 M/uL — ABNORMAL LOW (ref 3.80–5.20)
RDW: 11.9 % (ref 11.5–14.5)
WBC: 17.5 10*3/uL — ABNORMAL HIGH (ref 3.6–11.0)

## 2017-02-18 MED ORDER — FLUCONAZOLE 100 MG TAB
100 mg | Freq: Once | ORAL | Status: AC
Start: 2017-02-18 — End: 2017-02-18
  Administered 2017-02-18: 16:00:00 via ORAL

## 2017-02-18 MED FILL — LORAZEPAM 2 MG/ML IJ SOLN: 2 mg/mL | INTRAMUSCULAR | Qty: 1

## 2017-02-18 MED FILL — VANCOMYCIN 750 MG IV SOLUTION: 750 mg | INTRAVENOUS | Qty: 750

## 2017-02-18 MED FILL — NORMAL SALINE FLUSH 0.9 % INJECTION SYRINGE: INTRAMUSCULAR | Qty: 10

## 2017-02-18 MED FILL — HYDROMORPHONE 2 MG/ML INJECTION SOLUTION: 2 mg/mL | INTRAMUSCULAR | Qty: 1

## 2017-02-18 MED FILL — NYSTATIN 100,000 UNIT/ML ORAL SUSP: 100000 unit/mL | ORAL | Qty: 5

## 2017-02-18 MED FILL — PIPERACILLIN-TAZOBACTAM 3.375 GRAM IV SOLR: 3.375 gram | INTRAVENOUS | Qty: 3.38

## 2017-02-18 MED FILL — PREDNISONE 20 MG TAB: 20 mg | ORAL | Qty: 2

## 2017-02-18 MED FILL — K-TAB 10 MEQ TABLET,EXTENDED RELEASE: 10 mEq | ORAL | Qty: 4

## 2017-02-18 MED FILL — HYDROMORPHONE 2 MG TAB: 2 mg | ORAL | Qty: 2

## 2017-02-18 MED FILL — NORMAL SALINE FLUSH 0.9 % INJECTION SYRINGE: INTRAMUSCULAR | Qty: 30

## 2017-02-18 MED FILL — HYDROMORPHONE 4 MG TAB: 4 mg | ORAL | Qty: 1

## 2017-02-18 MED FILL — AMPHETAMINE-DEXTROAMPHETAMINE 10 MG TAB: 10 mg | ORAL | Qty: 2

## 2017-02-18 MED FILL — ENOXAPARIN 40 MG/0.4 ML SUB-Q SYRINGE: 40 mg/0.4 mL | SUBCUTANEOUS | Qty: 0.4

## 2017-02-18 MED FILL — NORMAL SALINE FLUSH 0.9 % INJECTION SYRINGE: INTRAMUSCULAR | Qty: 20

## 2017-02-18 MED FILL — SENOKOT-S 8.6 MG-50 MG TABLET: ORAL | Qty: 2

## 2017-02-18 MED FILL — FLUCONAZOLE 100 MG TAB: 100 mg | ORAL | Qty: 2

## 2017-02-18 NOTE — Wound Image (Signed)
Wound Consult:  Follow Up Visit. Chart reviewed.  Consulted for wound care to right anterior neck I&D site.  Spoke with patients nurse to hand off on visit this a.m.  Patient is resting on a total care bed.  Her wife is at bedside and she is learning to help remove packing from neck wound.  Assessment:  Anterior neck - linear incision with packing, small amount of dried serosanguinous drainage on dressing, induration at site lessening; no erythema in neck or edema.  No odor or purulence from area.  Treatment:  Moistened packing strip at opening with saline using small saline bullet; allowed to sit for a minute or so, gently pulled back ~ 1/2 to 1 cm; trimmed leaving over 1 cm tail.  Folded 4x4 and tape to secure.  Wound Recommendations:  Patient to remove remove ~ 1/2 cm of packing daily to two times daily and redress with dry gauze/tape.  Once removed completely, they will just use dry gauze/tape or dry gauze/bandaid.  Educated on signs of infection; to call if any of these are noticed; take all of antibiotic prescribed; follow up as directed with Dr. Sharl MaKerr.  Supplies for wound care ample for discharge.   Plan:  Discharge today with home wound care until seen by Dr. Sharl MaKerr.  Christina Bouchardarolyn R Hassan, RN,CWCN    Wound Healing Office [phone]  Pager [phone]

## 2017-02-18 NOTE — Progress Notes (Signed)
Music Therapy Assessment    Christina Stevens [id number]  [id number]    Oct 13, 1980  37 y.o.  female    Patient Telephone Number: [phone] (home)   Religious Affiliation: Catholic   Language: English   Extended Emergency Contact Information  Primary Emergency Contact: massie, cogliano  Mobile Phone: [phone]  Relation: Spouse   Patient Active Problem List    Diagnosis Date Noted   ??? Facial cellulitis 02/09/2017        Date: 02/18/2017       Mental Status:   [x]  Alert [  ] Forgetful [  ]  Confused  [  ] Minimally responsive    Communication Status: [  ] Impaired Speech [  ] Nonverbal -N/A    Physical Status:   [  ] Oxygen in use  [  ] Hard of Hearing [  ] Vision Impaired  [x]  Ambulatory  [  ] Ambulatory with assistance [  ] Non-ambulatory     Music Preferences, Background: Pt enjoys songs from each genre of music. She specifically named the [employer] as a important song to her. She also likes Bruce Springsteen. Pt named the band [employer] as a favorite right now.    Clinical Problem addressed: Support healthy coping, self-expression.    Goal(s) met in session:  Physical/Pain management (Scale of 1-10):    Pre-session rating: Pt reported pain on her chin and throat but did not rate it.  Post-session rating: Pt reported pain on her chin and throat but did not rate it.   [  ] Increased relaxation   [  ] Regulated breathing patterns  [  ] Decreased muscle tension   [  ] Minimized physical distress     Emotional/Psychological:  [x]  Increased self-expression   [  ] Decreased aggressive behavior   [  ] Decreased sadness   [  ] Discussed healthy coping skills     [  ] Improved mood    [  ] Decreased withdrawn behavior     Social:  [  ] Decreased feelings of isolation/loneliness [x]  Positive social interaction   [  ] Provided support and/or comfort for family/friends    Spiritual:  [  ] Spiritual support    [  ] Expressed peace  [  ] Expressed faith    [  ] Discussed beliefs     Techniques Utilized (Check all that apply):   [  ] Procedural support MT [  ] Music for relaxation [  ] Patient preferred music  [x]  Lyric analysis  [  ] Song choice  [  ] Music for validation  [  ] Entrainment  [  ] Progressive muscle relax. [  ] Guided visualization  [  ] ISO Principle  [  ] Patient instrument playing [  ] Song writing  [  ] Sing along   [  ] Improvisation  [x]  Sensory stimulation  [x]  Active Listening  [  ] Music for spiritual support [  ] Making of CDs as gifts    Session Observations:  F/u visit; Patient (pt) was alert standing in her room folding her clothing. This music therapist (MT) introduced self and pt said her spouse had told her about MT's visit this past Wednesday when she was in surgery. MT sat in a chair and asked pt about how she was feeling. Pt continued to fold her clothing and organize her belongings as she spoke about concerns she had  with her health. After about five minutes, the pt sat across from MT. She spoke about how her health issues and being in the hospital for more than 10 days now is affecting her family life. She expressed longing to go home and missing her children. MT provided active listening and words of validation and support. MT asked pt open-ended questions in response to pt speaking about her family. Pt increased self-expression in response to this, as evidenced by (AEB) sharing about her family members and friends. She also shared about her career in the [employer] and returning home from service nine months ago. MT then asked pt about her music preferences and music background. 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 This led pt to becoming tearful speaking about friends she has lost. Pt played a recording of the [employer] Me in the [place]. She engaged in lyric analysis with MT about specific lyrics that are  meaningful to her. Pt expressed gratitude for MT listening. Will follow as able.    Mindi Slicker, MT-BC (Music Therapist-Board Certified)  Spiritual Care Department  Referral-based service

## 2017-02-18 NOTE — Progress Notes (Signed)
Clinical Care Lead Arrival Summary    Name: Christina Stevens  MRN: [id number]  Date: 02/18/2017 9:48 AM  Admission Date: 02/09/2017  DOB: 1980-06-21  Situation:  02/18/17 LATERAL TRANSFER FROM 420 TO 444    Background:       Shortness of Breath [100001]    Sore Throat [82]    Medical History      Past Medical History Date Comments   ADHD [F90.9]     PCOS (polycystic ovarian syndrome) [E28.2]        Surgical History      Past Surgical History Laterality Date Comments   IR CHOLECYSTOSTOMY PERCUTANEOUS [IMG2273]      HX APPENDECTOMY [SHX54]         Social History      Category History   Smoking Status Current Every Day Smoker   Smokeless Tobacco Status Never Used   Alcohol Use No   Drug Use Yes; Types: Marijuana          The Beta blocker the patient is taking is @BETABLOCKERTAKING @, NONE          STAND: IF INDICATED  Voice quality/secretions:    Hx of dysphagia:    O2 sat:    Alertness:      Flu vaccination received for current season:  Received Flu Vaccine for Current Season (usually Sept-March): No    VTE Documentation  VTE Risk Assessment    Mechanical VTE orders: Mechanical VTE Orders: No(lovenox)  Venous Foot Pump:    Graduated Compression Stockings:    Sequential Compression Devices:    Patient Refused VTE:        Diet: DIET REGULAR              Assessment:  Lines/Drains/Airways:   Peripheral IV 02/16/17 Left;Posterior Hand (Active)   Site Assessment Clean, dry, & intact 02/18/2017  8:00 AM   Phlebitis Assessment 0 02/18/2017  8:00 AM   Infiltration Assessment 0 02/18/2017  8:00 AM   Dressing Status Clean, dry, & intact 02/18/2017  8:00 AM   Dressing Type Tape;Transparent 02/18/2017  8:00 AM   Hub Color/Line Status Pink;Infusing 02/18/2017  8:00 AM   Action Taken Open ports on tubing capped 02/18/2017  8:00 AM   Alcohol Cap Used Yes 02/18/2017  8:00 AM       Wound Neck (Active)   DRESSING STATUS Clean, dry, and intact 02/16/2017  5:36 PM   DRESSING TYPE Packing;4 x 4;Special tape (comment) 02/16/2017  4:45 PM       Last 3 Weights:   Last 3 Recorded Weights in this Encounter    02/16/17 0356 02/17/17 0254 02/18/17 0322   Weight: 75.7 kg (166 lb 14.2 oz) 75.1 kg (165 lb 9.1 oz) 75.1 kg (165 lb 9.1 oz)     DAILY WEIGHTS BETWEEN 12 MIDNIGHT AN 0700    Recommendations:POST CELLULITIS PATHWAY  Consult Orders: )IP CONSULT TO HOSPITALIST  IP CONSULT TO OTOLARYNGOLOGY  IP CONSULT TO INFECTIOUS DISEASES  IP CONSULT TO PALLIATIVE CARE - PROVIDER  IP CONSULT TO CARDIOLOGY  Recent orders:  SALINE LOCK IV  CT ABD PELV W CONT  CT NECK SOFT TISSUE W CONT  [place] HEAD NECK SOFT TISSUE  CT NECK SOFT TISSUE W CONT  CTA CHEST W OR W WO CONT  CT NECK SOFT TISSUE W CONT  CT HEAD WO CONT  CT NECK SOFT TISSUE W CONT  [place] HEAD NECK SOFT TISSUE  CARDIAC MONITORING  DISCONTINUE CARDIAC MONITORING  CARDIAC MONITORING  NURSING-MISCELLANEOUS:  NURSING-MISCELLANEOUS:  IP CONSULT TO HOSPITALIST  FULL CODE  INITIAL PHYSICIAN ORDER: INPATIENT  IP CONSULT TO PHARMACY - VANCOMYCIN DOSING  IP CONSULT TO WOUND CARE  IP CONSULT TO OTOLARYNGOLOGY  IP CONSULT TO INFECTIOUS DISEASES  IP CONSULT TO PALLIATIVE CARE - PROVIDER  TRANSFER PATIENT  TRANSFER PATIENT  TRANSFER PATIENT  TRANSFER PATIENT  TRANSFER PATIENT  IP CONSULT TO CARDIOLOGY  DIET REGULAR    Core Measures Compliant   CHF:NA   Pneumonia:NA   Vaccines:SCREEN   SCIP:NA   Foley:NA   AMI:NA

## 2017-02-18 NOTE — Progress Notes (Signed)
Assumed care of pt this afternoon. Pt is alert and oriented with no known needs at this time.

## 2017-02-18 NOTE — Progress Notes (Signed)
Problem: Cellulitis Care Plan (Adult)  Goal: *Absence of infection signs and symptoms  Outcome: Progressing Towards Goal  Monitor and treat pain associated with the wound from the I&D.   Monitor dressing to see if it's intact and change as needed as well as once a day.   Monitor vitals to ensure pt is stable.   Patient Vitals for the past 12 hrs:   Temp Pulse Resp BP SpO2   02/18/17 0322 98.5 ??F (36.9 ??C) (!) 56 13 109/55 94 %   02/17/17 2319 98 ??F (36.7 ??C) 73 21 121/85 91 %   02/17/17 1931 98.5 ??F (36.9 ??C) 71 16 (!) 149/128 96 %         Problem: Falls - Risk of  Goal: *Absence of Falls  Document Schmid Fall Risk and appropriate interventions in the flowsheet.  Outcome: Progressing Towards Goal  Fall Risk Interventions:  Mobility Interventions: Communicate number of staff needed for ambulation/transfer, Patient to call before getting OOB         Medication Interventions: Assess postural VS orthostatic hypotension, Evaluate medications/consider consulting pharmacy, Patient to call before getting OOB, Teach patient to arise slowly    Elimination Interventions: Call light in reach, Patient to call for help with toileting needs             Problem: Pressure Injury - Risk of  Goal: *Prevention of pressure injury  Document Braden Scale and appropriate interventions in the flowsheet.  Outcome: Progressing Towards Goal  Pressure Injury Interventions:  Sensory Interventions: Keep linens dry and wrinkle-free, Maintain/enhance activity level, Minimize linen layers    Moisture Interventions: Absorbent underpads, Maintain skin hydration (lotion/cream), Minimize layers    Activity Interventions: Increase time out of bed, Pressure redistribution bed/mattress(bed type)    Mobility Interventions: Pressure redistribution bed/mattress (bed type)    Nutrition Interventions: Document food/fluid/supplement intake    Friction and Shear Interventions: Minimize layers, HOB 30 degrees or less

## 2017-02-18 NOTE — Progress Notes (Signed)
Bedside shift change report given to Apolinar JunesBrandon RN (oncoming nurse) by Randolph BingKim RN (offgoing nurse). Report included the following information SBAR, Intake/Output, MAR, Recent Results and Cardiac Rhythm Sinus Tach.

## 2017-02-18 NOTE — Progress Notes (Signed)
TRANSFER - OUT REPORT:    Verbal report given to Selena BattenKim, RN(name) on Christina Stevens  being transferred to [facility](unit) for routine progression of care       Report consisted of patient???s Situation, Background, Assessment and   Recommendations(SBAR).     Information from the following report(s) SBAR, Kardex, Intake/Output, MAR, Recent Results and Cardiac Rhythm SB was reviewed with the receiving nurse.    Lines:   Peripheral IV 02/16/17 Left;Posterior Hand (Active)   Site Assessment Clean, dry, & intact 02/18/2017  8:00 AM   Phlebitis Assessment 0 02/18/2017  8:00 AM   Infiltration Assessment 0 02/18/2017  8:00 AM   Dressing Status Clean, dry, & intact 02/18/2017  8:00 AM   Dressing Type Tape;Transparent 02/18/2017  8:00 AM   Hub Color/Line Status Pink;Infusing 02/18/2017  8:00 AM   Action Taken Open ports on tubing capped 02/18/2017  8:00 AM   Alcohol Cap Used Yes 02/18/2017  8:00 AM        Opportunity for questions and clarification was provided.

## 2017-02-18 NOTE — Progress Notes (Signed)
0745 Bedside and Verbal shift change report given to Olivia, RN (oncoming nurse) by Taylor, RN (offgoing nurse). Report included the following information SBAR, Kardex, Intake/Output, MAR and Recent Results.

## 2017-02-18 NOTE — Progress Notes (Signed)
Hospitalist Progress Note  [name], MD  Answering service: [phone] OR [phone] from in house phone        Date of Service:  02/18/2017  NAME:  Christina Stevens  DOB:  03-Feb-1980  MRN:  [id number]      Admission Summary:   37 y.o F with PMH of ADHD,PCOS was admitted due to facial cellulitis.  Patient states that she was diagnosed with mononucleosis at her PCP office and was prescribed amoxicillin and steroids few weeks ago.Later she had cut herself while shaving and she continued to use amoxicillin for nearly 14 days. She also mentioned about going to [facility] ER.  Again,she cut herself again while shaving and since then she noticed swelling,difficult to open her mouth and breathing issues.      Interval history / Subjective:   Mouth less sore  Pain still present,but much better  She asked if she can be discharged.I let her know I am waiting update from the wound cultures and to discuss with ENT.  She is being transferred to another unit.     Assessment & Plan:     #Facial and neck cellulitis:  -she sustained a nick  while shaving her face.  -CT neck on admission did not show any abscess but USG showed soft tissue swelling  -on IV vanc and zosyn  -EN following.Patient fired ID  -CT neck on 02/11/2017 showed fluid collection under chin,per Dr.kerr it is phlegmon and not abscess yet.  -CT on 1/13 showed non drain able fluid collection in the submental region,reactive lymph nodes.  -As she had symptoms of hyperactive delirium(was very anxious,chest discomfort,hyperventilation,was crying intermittently) while on IV decadron 8 mg Q 8hr it was stopped on 1/13.Started on prednisone on 1/15 as patient  Confirmed with mer and Dr Sharl Ma that she did not have any problem with prednisone in the past and wants to try it.  -monitor vanc levels  -CT 1/16 showed abscess formation  -1/16: underwent I and D.Gram stain negative,cultures pending  -Wound care consulted.     #oral thrush: diflucanx1 and nystatin  -Improved    #hypokalemia: replacement ordered.    #Sinus bradycardia:asymptomatic  -TSH and T4 normal  -TTE unremarkable  -Suspected to be from high vagal tone.  -HR better    #Elevated blood pressure:improving  -she does not have a diagnosis of HTN.It is likely due to steroids,fluids, and emotional distress  -prn hydralazine if symptomatic.  -D/c iv fluids.      Leukocytosis:  -likely due to steroids as cellulitis is improving.  -WBC rebound due to steroids.    Morbid obesity:  Body mass index is 31.28 kg/m??.  -life style changes and weight loss recommended      #History of ADHD:  -on 20 mg adderall BID -confirmed dose from her pharmacy- resumed    "As she had chest pain and resp distress on 1/13 CT angio chest done which showed questionable filling defect  in the subsegmental branch of the right lower lobe pulmonary artery. However, given the degree of respiratory motion, definitive right lower lobe pulmonary embolus cannot be confirmed. She has been on lovenox since admission, it a subsegmental filling defect  -I  think she does not need therapeutic anticoagulation. Her resp distress is attributed to anxiety due to steroids."  -1/15:patient remained without shortness of breath,chest pain.SPo2 100% on room air,no tachycardia.No clinical suspicion for PE.Continue to monitor ,however,will have low threshold to scan her lung if any respiratory symptoms develop.  -1/18:  no sob,chest pain ,hypoxia for several days.    Code status:Full  DVT prophylaxis: Lovenox  Dispo: home in today or tomorrow.     Hospital Problems  Date Reviewed: Mar 07, 2017          Codes Class Noted POA    Facial cellulitis ICD-10-CM: L03.211  ICD-9-CM: 682.0  02/09/2017 Unknown                Review of Systems:   Pertinent items are noted in HPI.       Vital Signs:    Last 24hrs VS reviewed since prior progress note. Most recent are:  Visit Vitals   BP (!) 147/92 (BP 1 Location: Right arm, BP Patient Position: At rest)   Pulse (!) 58   Temp 98.4 ??F (36.9 ??C)   Resp 18   Ht 5\' 1"  (1.549 m)   Wt 75.1 kg (165 lb 9.1 oz)   SpO2 97%   Breastfeeding? No   BMI 31.28 kg/m??         Intake/Output Summary (Last 24 hours) at 02/18/2017 1009  Last data filed at 02/18/2017 0800  Gross per 24 hour   Intake 540 ml   Output 500 ml   Net 40 ml        Physical Examination:             Constitutional:  No acute distress, cooperative, pleasant??   ENT:  Oral mucous moist, no dental caries  -dressing on the under side of chin.     Resp:  CTA bilaterally. No wheezing/rhonchi/rales. No accessory muscle use   CV:  Regular rhythm, normal rate, no murmurs, gallops, rubs    GI:  Soft, non distended, non tender. normoactive bowel sounds, no hepatosplenomegaly     Musculoskeletal:  trace edema, warm, 2+ pulses throughout    Neurologic:  Moves all extremities.  AAOx3            Data Review:    Review and/or order of clinical lab test  Review and/or order of tests in the medicine section of CPT      Labs:     Recent Labs     02/18/17  0211 02/17/17  0521   WBC 17.5* 17.8*   HGB 12.5 12.2   HCT 36.0 35.9   PLT 315 294     Recent Labs     02/18/17  0211 02/17/17  0521 07-Mar-2017  0326   NA 138 139 137   K 3.4* 3.2* 3.5   CL 103 103 104   CO2 30 28 27    BUN 14 10 9    CREA 0.82 0.92 0.73   GLU 161* 117* 112*   CA 8.2* 8.3* 8.2*     No results for input(s): SGOT, GPT, ALT, AP, TBIL, TBILI, TP, ALB, GLOB, GGT, AML, LPSE in the last 72 hours.    No lab exists for component: AMYP, HLPSE  No results for input(s): INR, PTP, APTT in the last 72 hours.    No lab exists for component: INREXT, INREXT   No results for input(s): FE, TIBC, PSAT, FERR in the last 72 hours.   No results found for: FOL, RBCF   No results for input(s): PH, PCO2, PO2 in the last 72 hours.  No results for input(s): CPK, CKNDX, TROIQ in the last 72 hours.    No lab exists for component: CPKMB   No results found for: CHOL, CHOLX, CHLST, CHOLV, HDL, LDL, LDLC, DLDLP, TGLX, TRIGL, TRIGP,  [facility], [facility]  Lab Results   Component Value Date/Time    Glucose (POC) 126 (H) 02/13/2017 03:57 PM     Lab Results   Component Value Date/Time    Color YELLOW/STRAW 02/09/2017 07:34 PM    Appearance CLEAR 02/09/2017 07:34 PM    Specific gravity 1.010 02/09/2017 07:34 PM    pH (UA) 6.0 02/09/2017 07:34 PM    Protein NEGATIVE  02/09/2017 07:34 PM    Glucose NEGATIVE  02/09/2017 07:34 PM    Ketone NEGATIVE  02/09/2017 07:34 PM    Bilirubin NEGATIVE  02/09/2017 07:34 PM    Urobilinogen 0.2 02/09/2017 07:34 PM    Nitrites NEGATIVE  02/09/2017 07:34 PM    Leukocyte Esterase NEGATIVE  02/09/2017 07:34 PM    Epithelial cells MODERATE (A) 02/09/2017 07:34 PM    Bacteria NEGATIVE  02/09/2017 07:34 PM    WBC 0-4 02/09/2017 07:34 PM    RBC 0-5 02/09/2017 07:34 PM         Medications Reviewed:     Current Facility-Administered Medications   Medication Dose Route Frequency   ??? potassium chloride SR (KLOR-CON 10) tablet 40 mEq  40 mEq Oral DAILY   ??? nystatin (MYCOSTATIN) 100,000 unit/mL oral suspension 500,000 Units  500,000 Units Oral QID   ??? HYDROmorphone (DILAUDID) injection 1 mg  1 mg IntraVENous Q3H PRN   ??? HYDROmorphone (DILAUDID) tablet 4 mg  4 mg Oral Q4H PRN   ??? predniSONE (DELTASONE) tablet 40 mg  40 mg Oral DAILY WITH BREAKFAST   ??? acetaminophen (TYLENOL) tablet 650 mg  650 mg Oral Q6H PRN   ??? lidocaine (XYLOCAINE) 2 % jelly   Topical PRN   ??? vancomycin (VANCOCIN) 750 mg in 0.9% sodium chloride 250 mL (Vial2Bag)  750 mg IntraVENous Q8H   ??? senna-docusate (PERICOLACE) 8.6-50 mg per tablet 2 Tab  2 Tab Oral DAILY   ??? albuterol-ipratropium (DUO-NEB) 2.5 MG-0.5 MG/3 ML  3 mL Nebulization Q6H PRN   ??? ondansetron (ZOFRAN) injection 4 mg  4 mg IntraVENous Q8H PRN   ??? hydrALAZINE (APRESOLINE) 20 mg/mL injection 5 mg  5 mg IntraVENous Q6H PRN   ??? LORazepam (ATIVAN) injection 0.5 mg  0.5 mg IntraVENous Q8H PRN    ??? dextroamphetamine-amphetamine (ADDERALL) tablet 20 mg  20 mg Oral BID   ??? Vancomycin Pharmacy dosing   Other Rx Dosing/Monitoring   ??? sodium chloride (NS) flush 5-40 mL  5-40 mL IntraVENous Q8H   ??? sodium chloride (NS) flush 5-40 mL  5-40 mL IntraVENous PRN   ??? enoxaparin (LOVENOX) injection 40 mg  40 mg SubCUTAneous Q24H   ??? piperacillin-tazobactam (ZOSYN) 3.375 g in 0.9% sodium chloride (MBP/ADV) 100 mL  3.375 g IntraVENous Q8H     ______________________________________________________________________  EXPECTED LENGTH OF STAY: 3d 7h  ACTUAL LENGTH OF STAY:          9                 Vencent Hauschild Zollie Beckers Ignazio Kincaid, MD

## 2017-02-18 NOTE — Progress Notes (Signed)
I called micro-lab.Culture + for one colony of staph,further identification and sensitivity pending,will likely get definitive reports tomorrow.

## 2017-02-19 LAB — CBC WITH AUTOMATED DIFF
ABS. BASOPHILS: 0 10*3/uL (ref 0.0–0.1)
ABS. EOSINOPHILS: 0.1 10*3/uL (ref 0.0–0.4)
ABS. IMM. GRANS.: 0.3 10*3/uL — ABNORMAL HIGH (ref 0.00–0.04)
ABS. LYMPHOCYTES: 5.4 10*3/uL — ABNORMAL HIGH (ref 0.8–3.5)
ABS. MONOCYTES: 1.4 10*3/uL — ABNORMAL HIGH (ref 0.0–1.0)
ABS. NEUTROPHILS: 11.5 10*3/uL — ABNORMAL HIGH (ref 1.8–8.0)
ABSOLUTE NRBC: 0 10*3/uL (ref 0.00–0.01)
BASOPHILS: 0 % (ref 0–1)
EOSINOPHILS: 0 % (ref 0–7)
HCT: 36.4 % (ref 35.0–47.0)
HGB: 12.2 g/dL (ref 11.5–16.0)
IMMATURE GRANULOCYTES: 1 % — ABNORMAL HIGH (ref 0.0–0.5)
LYMPHOCYTES: 29 % (ref 12–49)
MCH: 32.5 PG (ref 26.0–34.0)
MCHC: 33.5 g/dL (ref 30.0–36.5)
MCV: 97.1 FL (ref 80.0–99.0)
MONOCYTES: 8 % (ref 5–13)
MPV: 10.4 FL (ref 8.9–12.9)
NEUTROPHILS: 62 % (ref 32–75)
NRBC: 0 PER 100 WBC
PLATELET: 299 10*3/uL (ref 150–400)
RBC: 3.75 M/uL — ABNORMAL LOW (ref 3.80–5.20)
RDW: 12.2 % (ref 11.5–14.5)
WBC: 18.8 10*3/uL — ABNORMAL HIGH (ref 3.6–11.0)

## 2017-02-19 LAB — METABOLIC PANEL, BASIC
Anion gap: 6 mmol/L (ref 5–15)
BUN/Creatinine ratio: 23 — ABNORMAL HIGH (ref 12–20)
BUN: 19 MG/DL (ref 6–20)
CO2: 28 mmol/L (ref 21–32)
Calcium: 8.2 MG/DL — ABNORMAL LOW (ref 8.5–10.1)
Chloride: 103 mmol/L (ref 97–108)
Creatinine: 0.84 MG/DL (ref 0.55–1.02)
GFR est AA: >60 mL/min/{1.73_m2} (ref >60)
GFR est non-AA: >60 mL/min/{1.73_m2} (ref >60)
Glucose: 106 mg/dL — ABNORMAL HIGH (ref 65–100)
Potassium: 3.8 mmol/L (ref 3.5–5.1)
Sodium: 137 mmol/L (ref 136–145)

## 2017-02-19 LAB — CULTURE, WOUND W GRAM STAIN: GRAM STAIN: NONE SEEN

## 2017-02-19 LAB — CULTURE, ANAEROBIC

## 2017-02-19 MED FILL — VANCOMYCIN 750 MG IV SOLUTION: 750 mg | INTRAVENOUS | Qty: 750

## 2017-02-19 MED FILL — SENOKOT-S 8.6 MG-50 MG TABLET: ORAL | Qty: 2

## 2017-02-19 MED FILL — K-TAB 10 MEQ TABLET,EXTENDED RELEASE: 10 mEq | ORAL | Qty: 4

## 2017-02-19 MED FILL — AMPHETAMINE-DEXTROAMPHETAMINE 10 MG TAB: 10 mg | ORAL | Qty: 2

## 2017-02-19 MED FILL — NORMAL SALINE FLUSH 0.9 % INJECTION SYRINGE: INTRAMUSCULAR | Qty: 10

## 2017-02-19 MED FILL — NYSTATIN 100,000 UNIT/ML ORAL SUSP: 100000 unit/mL | ORAL | Qty: 5

## 2017-02-19 MED FILL — HYDROMORPHONE 2 MG/ML INJECTION SOLUTION: 2 mg/mL | INTRAMUSCULAR | Qty: 1

## 2017-02-19 MED FILL — PIPERACILLIN-TAZOBACTAM 3.375 GRAM IV SOLR: 3.375 gram | INTRAVENOUS | Qty: 3.38

## 2017-02-19 MED FILL — LORAZEPAM 2 MG/ML IJ SOLN: 2 mg/mL | INTRAMUSCULAR | Qty: 1

## 2017-02-19 MED FILL — PREDNISONE 20 MG TAB: 20 mg | ORAL | Qty: 2

## 2017-02-19 MED FILL — ONDANSETRON (PF) 4 MG/2 ML INJECTION: 4 mg/2 mL | INTRAMUSCULAR | Qty: 2

## 2017-02-19 MED FILL — ENOXAPARIN 40 MG/0.4 ML SUB-Q SYRINGE: 40 mg/0.4 mL | SUBCUTANEOUS | Qty: 0.4

## 2017-02-19 NOTE — Progress Notes (Signed)
Bedside shift change report given to Nikki (oncoming nurse) by Cameron (offgoing nurse). Report included the following information SBAR.

## 2017-02-19 NOTE — Progress Notes (Signed)
Bedside shift change report given to Sheria Langameron, Charity fundraiserN (oncoming nurse) by Velna HatchetSheila, RN (offgoing nurse). Report included the following information SBAR, Procedure Summary, Intake/Output, MAR, Recent Results and Cardiac Rhythm NSR.

## 2017-02-19 NOTE — Progress Notes (Signed)
Hospitalist Progress Note  Christina Labrador, MD  Answering service: [phone] OR [phone] from in house phone        Date of Service:  02/19/2017  NAME:  Christina Stevens  DOB:  01/29/1981  MRN:  [id number]      Admission Summary:   37 y.o F with PMH of ADHD,PCOS was admitted due to facial cellulitis.  Patient states that she was diagnosed with mononucleosis at her PCP office and was prescribed amoxicillin and steroids few weeks ago.Later she had cut herself while shaving and she continued to use amoxicillin for nearly 14 days. She also mentioned about going to [facility] ER.  Again,she cut herself again while shaving and since then she noticed swelling,difficult to open her mouth and breathing issues.      Interval history / Subjective:   Ms Esty had insisted on leaving yesterday but the wound culture was pending so she did not get discharged  Today,she says she is in more pain,feel sick,she threw up last night she did not feel she could go.She feels she wound be back in three days "if you send me."  She has to go on oral antibiotics for her MRSA infection,so she will be kept on iv antibiotics until no more vomiting.       Assessment & Plan:     #Facial and neck cellulitis:  -she sustained a nick  while shaving her face.  -CT neck on admission did not show any abscess but USG showed soft tissue swelling  -on IV vanc and zosyn  -ENT following.Patient fired ID Research scientist (medical).  -CT neck on 02/11/2017 showed fluid collection under chin,per Dr.kerr it is phlegmon and not abscess yet.  -CT on 1/13 showed non drain able fluid collection in the submental region,reactive lymph nodes.  -As she had symptoms of hyperactive delirium(was very anxious,chest discomfort,hyperventilation,was crying intermittently) while on IV decadron 8 mg Q 8hr it was stopped on 1/13.Started on prednisone on 1/15 as patient  Confirmed with mer and Dr Sharl Ma that she did not have any  problem with prednisone in the past and wants to try it.  -monitor vanc levels  -CT 1/16 showed abscess formation  -1/16: underwent I and D.Gram stain negative,cultures grew MRSA  Susceptibility Staphylococcus aureus Methcillin Resistant     Antibiotic Interpretation Value Method Comment   Ampicillin ($) Resistant >8 ug/mL MIC    Ampicillin/sulbactam ($) Resistant <=8/4 ug/mL MIC    Cefazolin ($) Resistant <=4 ug/mL MIC    Clindamycin ($) Susceptible <=0.5 ug/mL MIC    Daptomycin ($$$$$) Susceptible 1 ug/mL MIC    Erythromycin ($$$$) Resistant >4 ug/mL MIC    Gentamicin ($) Susceptible <=4 ug/mL MIC    Linezolid ($$$$$) Susceptible 4 ug/mL MIC    Oxacillin Resistant >2 ug/mL MIC    Rifampin ($$$$) Susceptible <=1 ug/mL MIC Rifampin is not to be used for mono-therapy.   Tetracycline Susceptible <=4 ug/mL MIC    Trimeth/Sulfa Susceptible <=0.5/9.5 ug/mL MIC    Vancomycin ($) Susceptible 2 ug/mL MIC    Ciprofloxacin ($) Resistant >2 ug/mL MIC      -Wound care consulted,patient and her wife educated about caring for the wound at home and they are comfortable doing this at home.  -Pain control: patient encouraged to use more of the oral pain medications,wean off the iv dilaudid, as she is approaching discharge.    #oral thrush: received ,diflucanx1 and nystatin  -Improved    #hypokalemia:corrected.    #Sinus bradycardia:asymptomatic  -TSH and  T4 normal  -TTE unremarkable  -Suspected to be from high vagal tone.  -HR better    #Elevated blood pressure:improving  -she does not have a diagnosis of HTN.It is likely due to steroids,fluids, and emotional distress  -prn hydralazine if symptomatic.  -D/c iv fluids.      Leukocytosis:  -likely due to steroids as cellulitis is improving.  -WBC rebound due to steroids.    Morbid obesity:  Body mass index is 30.78 kg/m??.  -life style changes and weight loss recommended      #History of ADHD:  -on 20 mg adderall BID -confirmed dose from her pharmacy- resumed     "As she had chest pain and resp distress on 1/13 CT angio chest done which showed questionable filling defect  in the subsegmental branch of the right lower lobe pulmonary artery. However, given the degree of respiratory motion, definitive right lower lobe pulmonary embolus cannot be confirmed. She has been on lovenox since admission, it a subsegmental filling defect  -I  think she does not need therapeutic anticoagulation. Her resp distress is attributed to anxiety due to steroids."  -1/15:patient remained without shortness of breath,chest pain.SPo2 100% on room air,no tachycardia.No clinical suspicion for PE.Continue to monitor ,however,will have low threshold to scan her lung if any respiratory symptoms develop.  -1/18: no sob,chest pain ,hypoxia for several days.    Keep on tele as she has had episode of severe bradycardia  Code status:Full  DVT prophylaxis: Lovenox  Dispo: home on oral antibiotics.Discharged postponed today as patient reported vomiting and feeling more sick today.     Hospital Problems  Date Reviewed: 14-Mar-2017          Codes Class Noted POA    Facial cellulitis ICD-10-CM: L03.211  ICD-9-CM: 682.0  02/09/2017 Unknown                Review of Systems:   Pertinent items are noted in HPI.       Vital Signs:    Last 24hrs VS reviewed since prior progress note. Most recent are:  Visit Vitals  BP 117/58 (BP 1 Location: Left arm, BP Patient Position: At rest)   Pulse 69   Temp 98 ??F (36.7 ??C)   Resp 16   Ht 5\' 1"  (1.549 m)   Wt 73.9 kg (162 lb 14.7 oz)   SpO2 100%   Breastfeeding? No   BMI 30.78 kg/m??       No intake or output data in the 24 hours ending 02/19/17 1104     Physical Examination:             Constitutional:  No acute distress, cooperative, pleasant??   ENT:  Oral mucous moist, no dental caries  -dressing on the under side of chin.     Resp:  CTA bilaterally. No wheezing/rhonchi/rales. No accessory muscle use   CV:  Regular rhythm, normal rate, no murmurs, gallops, rubs     GI:  Soft, non distended, non tender. normoactive bowel sounds, no hepatosplenomegaly     Musculoskeletal:  trace edema, warm, 2+ pulses throughout    Neurologic:  Moves all extremities.  AAOx3            Data Review:    Review and/or order of clinical lab test  Review and/or order of tests in the medicine section of CPT      Labs:     Recent Labs     02/19/17  0310 02/18/17  0211   WBC  18.8* 17.5*   HGB 12.2 12.5   HCT 36.4 36.0   PLT 299 315     Recent Labs     02/19/17  0310 02/18/17  0211 02/17/17  0521   NA 137 138 139   K 3.8 3.4* 3.2*   CL 103 103 103   CO2 28 30 28    BUN 19 14 10    CREA 0.84 0.82 0.92   GLU 106* 161* 117*   CA 8.2* 8.2* 8.3*     No results for input(s): SGOT, GPT, ALT, AP, TBIL, TBILI, TP, ALB, GLOB, GGT, AML, LPSE in the last 72 hours.    No lab exists for component: AMYP, HLPSE  No results for input(s): INR, PTP, APTT in the last 72 hours.    No lab exists for component: INREXT, INREXT   No results for input(s): FE, TIBC, PSAT, FERR in the last 72 hours.   No results found for: FOL, RBCF   No results for input(s): PH, PCO2, PO2 in the last 72 hours.  No results for input(s): CPK, CKNDX, TROIQ in the last 72 hours.    No lab exists for component: CPKMB  No results found for: CHOL, CHOLX, CHLST, CHOLV, HDL, LDL, LDLC, DLDLP, TGLX, TRIGL, TRIGP, CHHD, CHHDX  Lab Results   Component Value Date/Time    Glucose (POC) 126 (H) 02/13/2017 03:57 PM     Lab Results   Component Value Date/Time    Color YELLOW/STRAW 02/09/2017 07:34 PM    Appearance CLEAR 02/09/2017 07:34 PM    Specific gravity 1.010 02/09/2017 07:34 PM    pH (UA) 6.0 02/09/2017 07:34 PM    Protein NEGATIVE  02/09/2017 07:34 PM    Glucose NEGATIVE  02/09/2017 07:34 PM    Ketone NEGATIVE  02/09/2017 07:34 PM    Bilirubin NEGATIVE  02/09/2017 07:34 PM    Urobilinogen 0.2 02/09/2017 07:34 PM    Nitrites NEGATIVE  02/09/2017 07:34 PM    Leukocyte Esterase NEGATIVE  02/09/2017 07:34 PM    Epithelial cells MODERATE (A) 02/09/2017 07:34 PM     Bacteria NEGATIVE  02/09/2017 07:34 PM    WBC 0-4 02/09/2017 07:34 PM    RBC 0-5 02/09/2017 07:34 PM         Medications Reviewed:     Current Facility-Administered Medications   Medication Dose Route Frequency   ??? potassium chloride SR (KLOR-CON 10) tablet 40 mEq  40 mEq Oral DAILY   ??? nystatin (MYCOSTATIN) 100,000 unit/mL oral suspension 500,000 Units  500,000 Units Oral QID   ??? HYDROmorphone (DILAUDID) injection 1 mg  1 mg IntraVENous Q3H PRN   ??? HYDROmorphone (DILAUDID) tablet 4 mg  4 mg Oral Q4H PRN   ??? predniSONE (DELTASONE) tablet 40 mg  40 mg Oral DAILY WITH BREAKFAST   ??? acetaminophen (TYLENOL) tablet 650 mg  650 mg Oral Q6H PRN   ??? lidocaine (XYLOCAINE) 2 % jelly   Topical PRN   ??? vancomycin (VANCOCIN) 750 mg in 0.9% sodium chloride 250 mL (Vial2Bag)  750 mg IntraVENous Q8H   ??? senna-docusate (PERICOLACE) 8.6-50 mg per tablet 2 Tab  2 Tab Oral DAILY   ??? albuterol-ipratropium (DUO-NEB) 2.5 MG-0.5 MG/3 ML  3 mL Nebulization Q6H PRN   ??? ondansetron (ZOFRAN) injection 4 mg  4 mg IntraVENous Q8H PRN   ??? hydrALAZINE (APRESOLINE) 20 mg/mL injection 5 mg  5 mg IntraVENous Q6H PRN   ??? LORazepam (ATIVAN) injection 0.5 mg  0.5 mg IntraVENous Q8H PRN   ???  dextroamphetamine-amphetamine (ADDERALL) tablet 20 mg  20 mg Oral BID   ??? Vancomycin Pharmacy dosing   Other Rx Dosing/Monitoring   ??? sodium chloride (NS) flush 5-40 mL  5-40 mL IntraVENous Q8H   ??? sodium chloride (NS) flush 5-40 mL  5-40 mL IntraVENous PRN   ??? enoxaparin (LOVENOX) injection 40 mg  40 mg SubCUTAneous Q24H   ??? piperacillin-tazobactam (ZOSYN) 3.375 g in 0.9% sodium chloride (MBP/ADV) 100 mL  3.375 g IntraVENous Q8H     ______________________________________________________________________  EXPECTED LENGTH OF STAY: 3d 7h  ACTUAL LENGTH OF STAY:          10                 ZOXWRUE Zollie Beckers, MD

## 2017-02-20 LAB — CBC WITH AUTOMATED DIFF
ABS. BASOPHILS: 0 10*3/uL (ref 0.0–0.1)
ABS. EOSINOPHILS: 0 10*3/uL (ref 0.0–0.4)
ABS. IMM. GRANS.: 0 10*3/uL
ABS. LYMPHOCYTES: 3.6 10*3/uL — ABNORMAL HIGH (ref 0.8–3.5)
ABS. MONOCYTES: 1.9 10*3/uL — ABNORMAL HIGH (ref 0.0–1.0)
ABS. NEUTROPHILS: 15.6 10*3/uL — ABNORMAL HIGH (ref 1.8–8.0)
ABSOLUTE NRBC: 0 10*3/uL (ref 0.00–0.01)
BASOPHILS: 0 % (ref 0–1)
EOSINOPHILS: 0 % (ref 0–7)
HCT: 38.7 % (ref 35.0–47.0)
HGB: 12.7 g/dL (ref 11.5–16.0)
IMMATURE GRANULOCYTES: 0 %
LYMPHOCYTES: 17 % (ref 12–49)
MCH: 32.5 PG (ref 26.0–34.0)
MCHC: 32.8 g/dL (ref 30.0–36.5)
MCV: 99 FL (ref 80.0–99.0)
MONOCYTES: 9 % (ref 5–13)
MPV: 10.1 FL (ref 8.9–12.9)
MYELOCYTES: 1 % — ABNORMAL HIGH
NEUTROPHILS: 73 % (ref 32–75)
NRBC: 0 PER 100 WBC
PLATELET: 314 10*3/uL (ref 150–400)
RBC: 3.91 M/uL (ref 3.80–5.20)
RDW: 12.5 % (ref 11.5–14.5)
WBC: 21.4 10*3/uL — ABNORMAL HIGH (ref 3.6–11.0)

## 2017-02-20 MED ORDER — CLINDAMYCIN 300 MG CAP
300 mg | ORAL_CAPSULE | Freq: Three times a day (TID) | ORAL | 0 refills | Status: AC
Start: 2017-02-20 — End: 2017-03-02

## 2017-02-20 MED ORDER — FAMOTIDINE 20 MG TAB
20 mg | ORAL_TABLET | Freq: Two times a day (BID) | ORAL | 0 refills | Status: AC
Start: 2017-02-20 — End: 2017-03-22

## 2017-02-20 MED ORDER — NYSTATIN 100,000 UNIT/ML ORAL SUSP
100000 unit/mL | Freq: Four times a day (QID) | ORAL | 0 refills | Status: DC
Start: 2017-02-20 — End: 2020-11-24

## 2017-02-20 MED ORDER — SENNOSIDES-DOCUSATE SODIUM 8.6 MG-50 MG TAB
ORAL_TABLET | Freq: Every day | ORAL | 0 refills | Status: DC | PRN
Start: 2017-02-20 — End: 2020-11-24

## 2017-02-20 MED ORDER — L.ACIDOPH & PARACASEI-S.THERMOPHIL-BIFIDOBACTERIUM 16 BILLION CELL CAP
16 billion cell | ORAL_CAPSULE | Freq: Every day | ORAL | 0 refills | Status: DC
Start: 2017-02-20 — End: 2020-11-24

## 2017-02-20 MED ORDER — PREDNISONE 10 MG TAB
10 mg | ORAL_TABLET | ORAL | 0 refills | Status: AC
Start: 2017-02-20 — End: 2017-03-04

## 2017-02-20 MED ORDER — HYDROMORPHONE 2 MG TAB
2 mg | ORAL_TABLET | Freq: Four times a day (QID) | ORAL | 0 refills | Status: DC | PRN
Start: 2017-02-20 — End: 2020-11-24

## 2017-02-20 MED ORDER — ONDANSETRON HCL 4 MG TAB
4 mg | ORAL_TABLET | Freq: Three times a day (TID) | ORAL | 0 refills | Status: DC | PRN
Start: 2017-02-20 — End: 2020-11-24

## 2017-02-20 MED ORDER — PREDNISONE 10 MG TAB
10 mg | Freq: Every day | ORAL | Status: DC
Start: 2017-02-20 — End: 2017-02-20
  Administered 2017-02-20: 14:00:00 via ORAL

## 2017-02-20 MED FILL — VANCOMYCIN 750 MG IV SOLUTION: 750 mg | INTRAVENOUS | Qty: 750

## 2017-02-20 MED FILL — NYSTATIN 100,000 UNIT/ML ORAL SUSP: 100000 unit/mL | ORAL | Qty: 5

## 2017-02-20 MED FILL — SENOKOT-S 8.6 MG-50 MG TABLET: ORAL | Qty: 2

## 2017-02-20 MED FILL — HYDROMORPHONE 4 MG TAB: 4 mg | ORAL | Qty: 1

## 2017-02-20 MED FILL — AMPHETAMINE-DEXTROAMPHETAMINE 10 MG TAB: 10 mg | ORAL | Qty: 2

## 2017-02-20 MED FILL — HYDROMORPHONE 2 MG/ML INJECTION SOLUTION: 2 mg/mL | INTRAMUSCULAR | Qty: 1

## 2017-02-20 MED FILL — PIPERACILLIN-TAZOBACTAM 3.375 GRAM IV SOLR: 3.375 gram | INTRAVENOUS | Qty: 3.38

## 2017-02-20 MED FILL — PREDNISONE 10 MG TAB: 10 mg | ORAL | Qty: 1

## 2017-02-20 MED FILL — ENOXAPARIN 40 MG/0.4 ML SUB-Q SYRINGE: 40 mg/0.4 mL | SUBCUTANEOUS | Qty: 0.4

## 2017-02-20 MED FILL — NORMAL SALINE FLUSH 0.9 % INJECTION SYRINGE: INTRAMUSCULAR | Qty: 10

## 2017-02-20 MED FILL — LORAZEPAM 2 MG/ML IJ SOLN: 2 mg/mL | INTRAMUSCULAR | Qty: 1

## 2017-02-20 MED FILL — K-TAB 10 MEQ TABLET,EXTENDED RELEASE: 10 mEq | ORAL | Qty: 4

## 2017-02-20 NOTE — Discharge Summary (Signed)
Discharge Summary       PATIENT ID: Christina Stevens  MRN: [id number]   DATE OF BIRTH: 09-23-1980    DATE OF ADMISSION: 02/09/2017  4:47 PM    DATE OF DISCHARGE: 02/20/2017  PRIMARY CARE PROVIDER: Ancil Boozer, NP     ATTENDING PHYSICIAN: [name], MD  DISCHARGING PROVIDER: [name], MD    To contact this individual call [phone] and ask the operator to page.  If unavailable ask to be transferred the Adult Hospitalist Department.    CONSULTATIONS: IP CONSULT TO HOSPITALIST  IP CONSULT TO OTOLARYNGOLOGY  IP CONSULT TO INFECTIOUS DISEASES  IP CONSULT TO PALLIATIVE CARE - PROVIDER  IP CONSULT TO CARDIOLOGY    PROCEDURES/SURGERIES: Procedure(s):  INCISION AND DRAINAGE NECK abcess    ADMITTING DIAGNOSES & HOSPITAL COURSE:   Admission Summary:   37 y.o F with PMH of ADHD,PCOS was admitted due to facial cellulitis.  Patient states that she was diagnosed with mononucleosis at her PCP office and was prescribed amoxicillin and steroids few weeks ago.Later she had cut herself while shaving and she continued to use amoxicillin for nearly 14 days. She also mentioned about going to [facility].  Again,she cut herself again while shaving and since then she noticed swelling,difficult to open her mouth and breathing issues.        Interval history / Subjective:   On day of discharge,round on patient with RN Hope.Ms Bucker is pack up and ready to go.She says she feels great and ready to go.She denied nausea,vomiting.She took oral pian pill this morning.She says she can handle pain with tylenol or motrin.I gave her prescription for some dilaudid as she has been getting iv dilaudid frequently while inpatient.         Assessment & Plan:    #Facial and neck cellulitis:  -she sustained a nick  while shaving her face.Initially,CT neck on admission did not show any abscess but USG showed soft tissue swelling and she was continued on IV vanc and zosyn.Repeat CT neck on 02/11/2017 showed  fluid collection under chin,per Dr.kerr it is phlegmon and not abscess yet.  -CT on 1/13 showed non drain able fluid collection in the submental region,reactive lymph nodes.She continued to have severe pain and another done CT 1/15 showed abscess formation and she subsequently underwent underwent I and D on 1/16.Gram stain negative,cultures grew MRSA,sensitive to clindamycin.  -ENT  followed.Dr Sharl Ma recommended taper off prednisone on discharge and antibiotics per culture.ID was following however patient fired ID Research scientist (medical).  -As she had symptoms of hyperactive delirium(was very anxious,chest discomfort,hyperventilation,was crying intermittently) while on IV decadron 8 mg Q 8hr it was stopped on 1/13.Started on prednisone on 1/15 as patient  Confirmed with mer and Dr Sharl Ma that she did not have any problem with prednisone in the past and wants to try it.She continued to tolerate prednisone.  Susceptibility Staphylococcus aureus Methcillin Resistant              Antibiotic Interpretation Value Method Comment   Ampicillin ($) Resistant >8 ug/mL MIC     Ampicillin/sulbactam ($) Resistant <=8/4 ug/mL MIC     Cefazolin ($) Resistant <=4 ug/mL MIC     Clindamycin ($) Susceptible <=0.5 ug/mL MIC     Daptomycin ($$$$$) Susceptible 1 ug/mL MIC     Erythromycin ($$$$) Resistant >4 ug/mL MIC     Gentamicin ($) Susceptible <=4 ug/mL MIC     Linezolid ($$$$$) Susceptible 4 ug/mL MIC  Oxacillin Resistant >2 ug/mL MIC     Rifampin ($$$$) Susceptible <=1 ug/mL MIC Rifampin is not to be used for mono-therapy.   Tetracycline Susceptible <=4 ug/mL MIC     Trimeth/Sulfa Susceptible <=0.5/9.5 ug/mL MIC     Vancomycin ($) Susceptible 2 ug/mL MIC     Ciprofloxacin ($) Resistant >2 ug/mL MIC        -Wound care consulted,patient and her wife educated about caring for the wound at home and they are comfortable doing this at home.  -Pain control: patient encouraged to use more of the oral pain  medications,wean off the iv dilaudid, as she is approaching discharge.     #oral thrush: received ,diflucanx1 and nystatin  -Improved     #hypokalemia:corrected.     #Sinus bradycardia,cardiologist were consulted and suspected to be from high vagal tone.:asymptomatic.resolved  -TSH and T4 normal  -TTE unremarkable  -HR in the 60-70s on discharge         #Elevated blood pressure:improved  -she does not have a diagnosis of HTN.It is likely due to steroids,fluids, and emotional distress  -prn hydralazine if symptomatic.  -D/c iv fluids.        Leukocytosis:  -likely due to steroids as cellulitis is improving.  -WBC rebound due to steroids.     Morbid obesity:  Body mass index is 30.78 kg/m??.  -life style changes and weight loss recommended        #History of ADHD:  -on 20 mg adderall BID -confirmed dose from her pharmacy- resumed     "As she had chest pain and resp distress on 1/13 CT angio chest done which showed questionable filling defect  in the subsegmental branch of the right lower lobe pulmonary artery. However, given the degree of respiratory motion, definitive right lower lobe pulmonary embolus cannot be confirmed. She has been on lovenox since admission, it a subsegmental filling defect  -I  think she does not need therapeutic anticoagulation. Her resp distress is attributed to anxiety due to steroids."  -1/15:patient remained without shortness of breath,chest pain.SPo2 100% on room air,no tachycardia.No clinical suspicion for PE.Continue to monitor ,however,will have low threshold to scan her lung if any respiratory symptoms develop.  -1/18: no sob,chest pain ,hypoxia for several days.         Patient was discharged in stable condition on 02/20/2017    DISCHARGE MEDICATIONS:  Current Discharge Medication List      START taking these medications    Details   HYDROmorphone (DILAUDID) 2 mg tablet Take 1 Tab by mouth every six (6) hours as needed. Max Daily Amount: 8 mg.  Qty: 14 Tab, Refills: 0     Associated Diagnoses: Facial cellulitis      nystatin (MYCOSTATIN) 100,000 unit/mL suspension Take 5 mL by mouth four (4) times daily. swish and spit  Qty: 60 mL, Refills: 0      senna-docusate (PERICOLACE) 8.6-50 mg per tablet Take 1-2 Tabs by mouth daily as needed for Constipation.  Qty: 20 Tab, Refills: 0      L.acidoph & parac-S.therm-Bifido (FLORA Q2/RISAQUAD-2) 16 billion cell cap cap Take 1 Cap by mouth daily.  Qty: 30 Cap, Refills: 0      ondansetron hcl (ZOFRAN) 4 mg tablet Take 1 Tab by mouth every eight (8) hours as needed for Nausea.  Qty: 14 Tab, Refills: 0      clindamycin (CLEOCIN) 300 mg capsule Take 1 Cap by mouth three (3) times daily for 10 days.  Qty: 30 Cap,  Refills: 0      predniSONE (DELTASONE) 10 mg tablet Take 30 mg by mouth daily (with breakfast) for 3 days, THEN 20 mg daily (with breakfast) for 3 days, THEN 10 mg daily (with breakfast) for 3 days, THEN 5 mg daily (with breakfast) for 3 days.  Qty: 20 Tab, Refills: 0      famotidine (PEPCID) 20 mg tablet Take 1 Tab by mouth two (2) times a day for 30 days.  Qty: 60 Tab, Refills: 0         CONTINUE these medications which have NOT CHANGED    Details   dextroamphetamine-amphetamine (ADDERALL) 20 mg tablet Take 40 mg by mouth daily.             PENDING TEST RESULTS:   At the time of discharge the following test results are still pending: none      DISCHARGE DIAGNOSES   Facial cellulitis with submental phlegmon which later evolved to submental abscess which required incision and drainage.Wound culture grew MRSA.You have received intravenous antibiotics(zosyn and vancomycin) for 10 days.You are prescribed clindamycin which the MRSA is sensitive to.Dr Sharl Ma also recommended prednisone be continued and tapered off.Call Dr Daune Perch office to schedule appointment.She suggested 2 weeks after discharge,however,if you experience increased pain,swelling, etc...you may call to see her sooner,if the symptoms are severe and you develop fever,go  to the ER.Otherwise ,we recommend you see your primary doctor within one week of discharge.  Take probiotics to prevent infectious diarrhea from the antibiotics.If you develop diarrhea,abdominal pain and and fever,let your doctors as soon as possible.    Wound care:  -Moisten guaze packing in neck wound with saline, pull back ~ 1/2 cm and trim daily - leave at least 1 cm tail. Cover with 2x2 and tape daily.    CONSULTATIONS: IP CONSULT TO HOSPITALIST  IP CONSULT TO OTOLARYNGOLOGY  IP CONSULT TO INFECTIOUS DISEASES  IP CONSULT TO PALLIATIVE CARE - PROVIDER  IP CONSULT TO CARDIOLOGY    PROCEDURES/SURGERIES: Procedure(s):  INCISION AND DRAINAGE NECK abcess    PENDING TEST RESULTS:   At the time of discharge the following test results are still pending: none    FOLLOW UP APPOINTMENTS:   Follow-up Information     Follow up With Specialties Details Why Contact Info    Tylene Fantasia, MD Otolaryngology In 2 weeks  [address]  [facility]  [address] [place] [address]  [phone]      Ancil Boozer, NP Nurse Practitioner In 1 week  [address]  [place] [address]  [phone]      Tylene Fantasia, MD Otolaryngology In 2 weeks Please call and schedule appointment. [address]  [facility]  [address] [place] [address]  [phone]           ADDITIONAL CARE RECOMMENDATIONS:     DIET: Regular Diet    ACTIVITY: Activity as tolerated    WOUND CARE:Moisten  guaze packing in neck wound with saline, pull back ~ 1/2 cm and trim daily - leave at least 1 cm tail. Cover with 2x2 and tape daily.    EQUIPMENT needed: NA      FOLLOW UP APPOINTMENTS:    Follow-up Information     Follow up With Specialties Details Why Contact Info    Tylene Fantasia, MD Otolaryngology In 2 weeks  [address] [facility]  [facility]  [address]  [place]  [place] [id number]  [phone]      Ancil Boozeroggin, Michael Q, NP Nurse Practitioner In 1 week  [address]  [place] [id number]  [phone]       Tylene FantasiaKerr, Julie T, MD Otolaryngology In 2 weeks Please call and schedule appointment. [address]  [facility]  [address]  [place] [id number]  [phone]             NOTIFY YOUR PHYSICIAN FOR ANY OF THE FOLLOWING:   Fever over 101 degrees for 24 hours.   Chest pain, shortness of breath, fever, chills, nausea, vomiting, diarrhea, change in mentation, falling, weakness, bleeding. Severe pain or pain not relieved by medications.  Or, any other signs or symptoms that you may have questions about.    DISPOSITION:  x  Home With:   OT  PT  HH  RN       SNF(Name)    Inpatient Rehab(name)    Independent/assisted living(name)    Hospice    Other:       PATIENT CONDITION AT DISCHARGE:     Functional status        Poor     Deconditioned    x Independent    Cognition       x Lucid    Forgetful    Dementia   Catheter/Lines(indications)     Foley    PICC    PEG   x None   Code status     x Full    DNR       PHYSICAL EXAMINATION AT DISCHARGE:     Visit Vitals  BP 125/67 (BP 1 Location: Right arm, BP Patient Position: At rest)   Pulse 68   Temp 98.5 ??F (36.9 ??C)   Resp 16   Ht 5\' 1"  (1.549 m)   Wt 73.9 kg (162 lb 14.7 oz)   LMP 01/27/2017 (Within Weeks) Comment: irregular due to PCOS   SpO2 98%   Breastfeeding? No   BMI 30.78 kg/m??    O2 Flow Rate (L/min): 2 l/min O2 Device: Room air    Temp (24hrs), Avg:98.1 ??F (36.7 ??C), Min:97.5 ??F (36.4 ??C), Max:98.5 ??F (36.9 ??C)    No intake/output data recorded.   01/18 1901 - 01/20 0700  In: 5800 [I.V.:5800]  Out: -     GENERAL:  Alert, oriented, cooperative, no apparent distress  HEENT: small submental swelling with guaze in place w/o discharge,moderately tender,   NECK: Supple, trachea midline, no adenopathy, no thyromegally or tenderness, no carotid bruit and no JVD.  LUNGS:   Vesicular breath sounds bilaterally, no added sounds.  HEART:   S1 and S2 well heard,RRR,  no murmur, click, rub or gallop.   ABDOMEB:   Soft, non-tender. Normoactive bowel sounds. No masses,  No organomegaly.  EXTREMETIES:  Atraumatic, acyanotic, no edema  PULSES: 2+ and symmetric all extremities.  SKIN:  No rashes or lesions  NEUROLOGY: Alert and oriented to PPT, CNII-XII intact. Motor and sensory exam grossly intact.      CHRONIC MEDICAL DIAGNOSES:  Problem List as of 02/20/2017 Date Reviewed: 02/16/2017          Codes Class Noted - Resolved    Facial cellulitis ICD-10-CM: L03.211  ICD-9-CM: 682.0  02/09/2017 - Present              Greater than 30 minutes were spent with the patient on counseling and coordination of care    Signed:   [id number] T  Shelby Anderle, MD  02/20/2017  9:47 AM

## 2017-02-20 NOTE — Progress Notes (Signed)
Problem: Cellulitis Care Plan (Adult)  Goal: *Control of acute pain  Outcome: Progressing Towards Goal  Pain assessment q 4hrs ongoing. Pain adequately controlled with IV dilaudid. Encouraged patientt to take po pain medication since the patient is expected to be discharged today, patient refuses to take PO pain medication at this time.     Problem: Falls - Risk of  Goal: *Absence of Falls  Document Schmid Fall Risk and appropriate interventions in the flowsheet.  Outcome: Progressing Towards Goal  Fall Risk Interventions:  Mobility Interventions: Communicate number of staff needed for ambulation/transfer, Patient to call before getting OOB    Medication Interventions: Patient to call before getting OOB, Teach patient to arise slowly    Elimination Interventions: Call light in reach             Problem: Pressure Injury - Risk of  Goal: *Prevention of pressure injury  Document Braden Scale and appropriate interventions in the flowsheet.  Outcome: Progressing Towards Goal  Pressure Injury Interventions:  Sensory Interventions: Keep linens dry and wrinkle-free, Maintain/enhance activity level, Minimize linen layers    Moisture Interventions: Absorbent underpads, Maintain skin hydration (lotion/cream), Minimize layers    Activity Interventions: Increase time out of bed    Mobility Interventions: Pressure redistribution bed/mattress (bed type)    Nutrition Interventions: Document food/fluid/supplement intake, Discuss nutritional consult with provider, Offer support with meals,snacks and hydration    Friction and Shear Interventions: Minimize layers, HOB 30 degrees or less

## 2017-02-20 NOTE — Other (Signed)
Discharge instructions discussed with patient. All questions answered. Patient given a copy of her discharge instructions. Patient given all prescriptions. Patient taken to front lobby via wheelchair.

## 2017-02-20 NOTE — Discharge Summary (Signed)
Discharge Summary by Larey Dresser T, MD at 02/20/17 1610                Author: [name], MD  Service: Internal Medicine  Author Type: Physician       Filed: 02/20/17 0959  Date of Service: 02/20/17 0947  Status: Signed          Editor: [name], MD (Physician)                       Discharge Summary           PATIENT ID: Christina Stevens   MRN: [id number]     DATE OF BIRTH: [birth year]      DATE OF ADMISSION: 02/09/2017  4:47 PM      DATE OF DISCHARGE:  02/20/2017   PRIMARY CARE PROVIDER:  Ancil Boozer, NP       ATTENDING PHYSICIAN: [name], MD   DISCHARGING PROVIDER:  [name], MD     To contact this individual call [phone] and ask the operator to page.  If unavailable ask to be transferred the Adult Hospitalist Department.      CONSULTATIONS: IP CONSULT TO HOSPITALIST   IP CONSULT TO OTOLARYNGOLOGY   IP CONSULT TO INFECTIOUS DISEASES   IP CONSULT TO PALLIATIVE CARE - PROVIDER   IP CONSULT TO CARDIOLOGY      PROCEDURES/SURGERIES: Procedure(s):   INCISION AND DRAINAGE NECK abcess      ADMITTING DIAGNOSES & HOSPITAL  COURSE:    Admission Summary:        37 y.o F with PMH of ADHD,PCOS was admitted due to facial cellulitis.   Patient states that she was diagnosed with mononucleosis at her PCP office and was prescribed amoxicillin and steroids few weeks ago.Later she had cut herself while shaving and she continued to use amoxicillin for nearly 14 days. She also mentioned about  going to [facility] ER.  Again,she cut herself again while shaving and since then she noticed swelling,difficult to open her mouth and breathing issues.           Interval history / Subjective:        On day of discharge,round on patient with RN Hope.Ms Rae is pack up and ready to go.She says she feels great and ready to go.She denied nausea,vomiting.She  took oral pian pill this morning.She says she can handle pain with tylenol or motrin.I gave her prescription for some dilaudid as she has been  getting iv dilaudid frequently while inpatient.               Assessment & Plan:      #Facial and neck cellulitis:   -she sustained a nick  while shaving her face.Initially,CT neck on admission did not show any abscess but USG showed soft tissue swelling and she was continued on IV vanc and zosyn.Repeat CT neck on 02/11/2017 showed fluid collection under chin,per Dr.kerr  it is phlegmon and not abscess yet.   -CT on 1/13 showed non drain able fluid collection in the submental region,reactive lymph nodes.She continued to have severe pain and another done CT 1/15 showed abscess formation and she subsequently underwent underwent I and D on 1/16.Gram stain negative,cultures grew  MRSA,sensitive to clindamycin.   -ENT  followed.Dr Sharl Ma recommended taper off prednisone on discharge and antibiotics per culture.ID was following however p atient fired Adult nurse.   -As she had symptoms of hyperactive delirium(was very anxious,chest  discomfort,hyperventilation,was crying intermittently) while on IV decadron 8 mg Q 8hr it was stopped on 1/13.Started on prednisone  on 1/15 as patient  Confirmed with mer and Dr Sharl Ma that she did not have any problem with prednisone in the past and wants to try it.She continued to tolerate prednisone.   Susceptibility Staphylococcus aureus Methcillin Resistant                                    Antibiotic  Interpretation  Value  Method  Comment            Ampicillin ($)  Resistant  >8  ug/mL  MIC        Ampicillin/sulbactam ($)  Resistant  <=8/4  ug/mL  MIC        Cefazolin ($)  Resistant  <=4  ug/mL  MIC        Clindamycin ($)  Susceptible  <=0.5  ug/mL  MIC        Daptomycin ($$$$$)  Susceptible  1  ug/mL  MIC        Erythromycin ($$$$)  Resistant  >4  ug/mL  MIC        Gentamicin ($)  Susceptible  <=4  ug/mL  MIC        Linezolid ($$$$$)  Susceptible  4  ug/mL  MIC        Oxacillin  Resistant  >2  ug/mL  MIC        Rifampin ($$$$)  Susceptible  <=1  ug/mL  MIC  Rifampin is not to be used for  mono-therapy.     Tetracycline  Susceptible  <=4  ug/mL  MIC        Trimeth/Sulfa  Susceptible  <=0.5/9.5  ug/mL  MIC        Vancomycin ($)  Susceptible  2  ug/mL  MIC        Ciprofloxacin ($)  Resistant  >2  ug/mL  MIC            -Wound care consulted,patient and her wife educated about caring for the wound at home and they are comfortable doing this at home.   -Pain control: patient encouraged to use more of the oral pain medications,wean off the iv dilaudid, as she is approaching discharge.       #oral thrush: received ,diflucanx1 and nystatin   -Improved       #hypokalemia:corrected.       #Sinus bradycardia,cardiologist were consulted and suspected to be from high vagal tone.:asymptomatic.resolved   -TSH and T4 normal   -TTE unremarkable   -HR in the 60-70s on discharge             #Elevated blood pressure:improved   -she does not have a diagnosis of HTN.It is likely due to steroids,fluids, and emotional distress   -prn hydralazine if symptomatic.   -D/c iv fluids.           Leukocytosis:   -likely due to steroids as cellulitis is improving.   -WBC rebound due to steroids.       Morbid obesity:   Body mass index is 30.78 kg/m??.   -life style changes and weight loss recommended           #History of ADHD:   -on 20 mg adderall BID -confirmed dose from her pharmacy- resumed       "As she had chest pain and resp distress on  1/13 CT angio chest done which showed questionable filling defect  in the subsegmental branch of the right lower lobe pulmonary artery. However, given the degree of respiratory motion, definitive right lower  lobe pulmonary embolus cannot be confirmed. She has been on lovenox since admission, it a subsegmental filling defect  -I  think she does not need therapeutic anticoagulation. Her resp distress is attributed to anxiety due to steroids."   -1/15:patient remained without shortness of breath,chest pain.SPo2 100% on room air,no tachycardia.No clinical suspicion for PE.Continue to monitor  ,however,will have low threshold to scan her lung if any respiratory symptoms develop.   -1/18: no sob,chest pain ,hypoxia for several days.             Patient was discharged in stable condition on 02/20/2017      DISCHARGE MEDICATIONS:     Current Discharge Medication List              START taking these medications          Details        HYDROmorphone (DILAUDID) 2 mg tablet  Take 1 Tab by mouth every six (6) hours as needed. Max Daily Amount: 8 mg.   Qty: 14 Tab, Refills:  0          Associated Diagnoses: Facial cellulitis               nystatin (MYCOSTATIN) 100,000 unit/mL suspension  Take 5 mL by mouth four (4) times daily. swish and spit   Qty: 60 mL, Refills:  0               senna-docusate (PERICOLACE) 8.6-50 mg per tablet  Take 1-2 Tabs by mouth daily as needed for Constipation.   Qty: 20 Tab, Refills:  0               L.acidoph & parac-S.therm-Bifido (FLORA Q2/RISAQUAD-2) 16 billion cell cap cap  Take 1 Cap by mouth daily.   Qty: 30 Cap, Refills:  0               ondansetron hcl (ZOFRAN) 4 mg tablet  Take 1 Tab by mouth every eight (8) hours as needed for Nausea.   Qty: 14 Tab, Refills:  0               clindamycin (CLEOCIN) 300 mg capsule  Take 1 Cap by mouth three (3) times daily for 10 days.   Qty: 30 Cap, Refills:  0               predniSONE (DELTASONE) 10 mg tablet  Take 30 mg by mouth daily (with breakfast) for 3 days, THEN 20 mg daily (with breakfast) for 3 days, THEN 10 mg daily (with breakfast) for 3 days, THEN  5 mg daily (with breakfast) for 3 days.   Qty: 20 Tab, Refills:  0               famotidine (PEPCID) 20 mg tablet  Take 1 Tab by mouth two (2) times a day for 30 days.   Qty: 60 Tab, Refills:  0                     CONTINUE these medications which have NOT CHANGED          Details        dextroamphetamine-amphetamine (ADDERALL) 20 mg tablet  Take 40 mg by mouth daily.  PENDING TEST RESULTS:    At the time of discharge the following test results are still pending:  none         DISCHARGE DIAGNOSES    Facial cellulitis with submental phlegmon which later evolved to submental abscess which required incision and drainage.Wound culture grew MRSA.You have received intravenous antibiotics(zosyn and vancomycin) for 10 days.You are prescribed clindamycin  which the MRSA is sensitive to.Dr Sharl Ma also recommended prednisone be continued and tapered off.Call Dr Daune Perch office to schedule appointment.She suggested 2 weeks after discharge,however,if you experience increased pain,swelling, etc...you may call to  see her sooner,if the symptoms are severe and you develop fever,go to the ER.Otherwise ,we recommend you see your primary doctor within one week of discharge.   Take probiotics to prevent infectious diarrhea from the antibiotics.If you develop diarrhea,abdominal pain and and fever,let your doctors as soon as possible.      Wound care:   -Moisten guaze packing in neck wound with saline, pull back ~ 1/2 cm and trim daily - leave at least 1 cm tail. Cover with 2x2 and tape daily.      CONSULTATIONS: IP CONSULT TO HOSPITALIST   IP CONSULT TO OTOLARYNGOLOGY   IP CONSULT TO INFECTIOUS DISEASES   IP CONSULT TO PALLIATIVE CARE - PROVIDER   IP CONSULT TO CARDIOLOGY      PROCEDURES/SURGERIES: Procedure(s):   INCISION AND DRAINAGE NECK abcess      PENDING TEST RESULTS:    At the time of discharge the following test results are still pending: none      FOLLOW UP APPOINTMENTS:      Follow-up Information               Follow up With  Specialties  Details  Why  Contact Info              Tylene Fantasia, MD  Otolaryngology  In 2 weeks    [address]   [facility]   [address] [place] [address]   [phone]                 Ancil Boozer, NP  Nurse Practitioner  In 1 week    [address]   [place] [address]   [phone]                 Tylene Fantasia, MD  Otolaryngology  In 2 weeks  Please call and schedule appointment.  [address]   [facility]   [address] [place] [address]   [phone]                    ADDITIONAL CARE RECOMMENDATIONS:        DIET: Regular Diet      ACTIVITY: Activity as tolerated      WOUND CARE:Moisten  guaze packing in neck wound with saline, pull back ~ 1/2 cm and trim daily - leave at least 1 cm tail. Cover with 2x2 and tape daily.      EQUIPMENT needed: NA         FOLLOW UP APPOINTMENTS:       Follow-up Information               Follow up With  Specialties  Details  Why  Contact Info              Tylene Fantasia, MD  Otolaryngology  In 2 weeks    [address]   [employer]   [address] [place] [address]   [phone]                 Ancil Boozer, NP  Nurse Practitioner  In 1 week    [address]   [place] [address]   [phone]                 Tylene Fantasia, MD  Otolaryngology  In 2 weeks  Please call and schedule appointment.  [address]   [employer]   [address] [place] [address]   [phone]                    NOTIFY YOUR PHYSICIAN FOR ANY OF THE FOLLOWING:    Fever over 101 degrees for 24 hours.    Chest pain, shortness of breath, fever, chills, nausea, vomiting, diarrhea, change in mentation, falling, weakness, bleeding. Severe pain or pain not relieved by medications.   Or, any other signs or symptoms that you may have questions about.      DISPOSITION:      x   Home With:     OT    PT    HH    RN                   SNF(Name)       Inpatient Rehab(name)       Independent/assisted living(name)          Hospice          Other:           PATIENT CONDITION AT DISCHARGE:          Functional status             Poor        Deconditioned      x  Independent      Cognition           x  Lucid       Forgetful       Dementia     Catheter/Lines(indications)         Foley       PICC       PEG     x  None     Code status        x  Full          DNR           PHYSICAL EXAMINATION AT DISCHARGE:       Visit Vitals      BP  125/67 (BP 1 Location: Right arm, BP Patient  Position: At rest)     Pulse  68     Temp  98.5 ??F (36.9 ??C)     Resp  16     Ht  5\' 1"  (1.549 m)     Wt  73.9 kg (162 lb 14.7 oz)         LMP  01/27/2017 (Within Weeks)  Comment: irregular due to PCOS        SpO2  98%     Breastfeeding?  No        BMI  30.78 kg/m??      O2 Flow Rate (L/min): 2 l/min  O2 Device: Room air      Temp (24hrs), Avg:98.1 ??F (36.7 ??C), Min:97.5 ??  F (36.4 ??C), Max:98.5 ??F (36.9 ??C)     No intake/output data recorded.    01/18 1901 - 01/20 0700   In: 5800 [I.V.:5800]   Out: -       GENERAL:  Alert, oriented, cooperative, no apparent distress   HEENT: small submental swelling with guaze in place w/o discharge,moderately tender,    NECK: Supple, trachea midline, no adenopathy, no thyromegally or tenderness, no carotid bruit and no JVD.   LUNGS:   Vesicular breath sounds bilaterally, no added sounds.   HEART:   S1 and S2 well heard,RRR,  no murmur, click, rub or gallop.   ABDOMEB:   Soft, non-tender. Normoactive bowel sounds. No masses,  No organomegaly.   EXTREMETIES:  Atraumatic, acyanotic, no edema   PULSES: 2+ and symmetric all extremities.   SKIN:  No rashes or lesions   NEUROLOGY: Alert and oriented to PPT, CNII-XII intact. Motor and sensory exam grossly intact.         CHRONIC MEDICAL DIAGNOSES:      Problem List as of 02/20/2017  Date Reviewed:  02/16/2017                        Codes  Class  Noted - Resolved             Facial cellulitis  ICD-10-CM: L03.211   ICD-9-CM: 682.0    02/09/2017 - Present                          Greater than 30 minutes were spent with the patient on counseling and coordination of care      Signed:    [id number] Zollie Beckers Roemello Speyer, MD   02/20/2017   9:47 AM

## 2017-03-21 LAB — CULTURE, FUNGUS

## 2017-06-15 ENCOUNTER — Inpatient Hospital Stay: Admit: 2017-06-15 | Payer: BLUE CROSS/BLUE SHIELD | Primary: Primary Care

## 2017-06-15 ENCOUNTER — Encounter

## 2017-06-15 DIAGNOSIS — R05 Cough: Secondary | ICD-10-CM

## 2017-09-06 ENCOUNTER — Inpatient Hospital Stay
Admit: 2017-09-06 | Discharge: 2017-09-07 | Disposition: A | Discharge status: Home / Self Care | Payer: BLUE CROSS/BLUE SHIELD | Attending: Emergency Medicine

## 2017-09-06 ENCOUNTER — Emergency Department: Admit: 2017-09-06 | Payer: BLUE CROSS/BLUE SHIELD | Primary: Primary Care

## 2017-09-06 DIAGNOSIS — M79661 Pain in right lower leg: Secondary | ICD-10-CM

## 2017-09-06 LAB — CBC WITH AUTO DIFFERENTIAL
Basophils %: 1 % (ref 0–1)
Basophils Absolute: 0.1 10*3/uL (ref 0.0–0.1)
Eosinophils %: 8 % — ABNORMAL HIGH (ref 0–7)
Eosinophils Absolute: 0.7 10*3/uL — ABNORMAL HIGH (ref 0.0–0.4)
Granulocyte Absolute Count: 0 10*3/uL (ref 0.00–0.04)
Hematocrit: 39.9 % (ref 35.0–47.0)
Hemoglobin: 13.9 g/dL (ref 11.5–16.0)
Immature Granulocytes: 0 % (ref 0.0–0.5)
Lymphocytes %: 35 % (ref 12–49)
Lymphocytes Absolute: 3.1 10*3/uL (ref 0.8–3.5)
MCH: 32.3 PG (ref 26.0–34.0)
MCHC: 34.8 g/dL (ref 30.0–36.5)
MCV: 92.8 FL (ref 80.0–99.0)
MPV: 10.2 FL (ref 8.9–12.9)
Monocytes %: 6 % (ref 5–13)
Monocytes Absolute: 0.5 10*3/uL (ref 0.0–1.0)
NRBC Absolute: 0 10*3/uL (ref 0.00–0.01)
Neutrophils %: 50 % (ref 32–75)
Neutrophils Absolute: 4.4 10*3/uL (ref 1.8–8.0)
Nucleated RBCs: 0 PER 100 WBC
Platelets: 259 10*3/uL (ref 150–400)
RBC: 4.3 M/uL (ref 3.80–5.20)
RDW: 11.9 % (ref 11.5–14.5)
WBC: 8.7 10*3/uL (ref 3.6–11.0)

## 2017-09-06 LAB — COMPREHENSIVE METABOLIC PANEL
ALT: 17 U/L (ref 12–78)
AST: 13 U/L — ABNORMAL LOW (ref 15–37)
Albumin/Globulin Ratio: 1 — ABNORMAL LOW (ref 1.1–2.2)
Albumin: 3.4 g/dL — ABNORMAL LOW (ref 3.5–5.0)
Alkaline Phosphatase: 57 U/L (ref 45–117)
Anion Gap: 6 mmol/L (ref 5–15)
BUN: 9 MG/DL (ref 6–20)
Bun/Cre Ratio: 10 — ABNORMAL LOW (ref 12–20)
CO2: 28 mmol/L (ref 21–32)
Calcium: 8.6 MG/DL (ref 8.5–10.1)
Chloride: 107 mmol/L (ref 97–108)
Creatinine: 0.86 MG/DL (ref 0.55–1.02)
EGFR IF NonAfrican American: >60 mL/min/{1.73_m2} (ref >60)
GFR African American: >60 mL/min/{1.73_m2} (ref >60)
Globulin: 3.3 g/dL (ref 2.0–4.0)
Glucose: 118 mg/dL — ABNORMAL HIGH (ref 65–100)
Potassium: 2.9 mmol/L — ABNORMAL LOW (ref 3.5–5.1)
Sodium: 141 mmol/L (ref 136–145)
Total Bilirubin: 0.4 MG/DL (ref 0.2–1.0)
Total Protein: 6.7 g/dL (ref 6.4–8.2)

## 2017-09-06 LAB — POCT LACTIC ACID: POC Lactic Acid: 1.63 mmol/L (ref 0.40–2.00)

## 2017-09-06 LAB — METABOLIC PANEL, COMPREHENSIVE
A-G Ratio: 1 — ABNORMAL LOW (ref 1.1–2.2)
ALT (SGPT): 17 U/L (ref 12–78)
AST (SGOT): 13 U/L — ABNORMAL LOW (ref 15–37)
Albumin: 3.4 g/dL — ABNORMAL LOW (ref 3.5–5.0)
Alk. phosphatase: 57 U/L (ref 45–117)
Anion gap: 6 mmol/L (ref 5–15)
BUN/Creatinine ratio: 10 — ABNORMAL LOW (ref 12–20)
BUN: 9 MG/DL (ref 6–20)
Bilirubin, total: 0.4 MG/DL (ref 0.2–1.0)
CO2: 28 mmol/L (ref 21–32)
Calcium: 8.6 MG/DL (ref 8.5–10.1)
Chloride: 107 mmol/L (ref 97–108)
Creatinine: 0.86 MG/DL (ref 0.55–1.02)
GFR est AA: >60 mL/min/{1.73_m2} (ref >60)
GFR est non-AA: >60 mL/min/{1.73_m2} (ref >60)
Globulin: 3.3 g/dL (ref 2.0–4.0)
Glucose: 118 mg/dL — ABNORMAL HIGH (ref 65–100)
Potassium: 2.9 mmol/L — ABNORMAL LOW (ref 3.5–5.1)
Protein, total: 6.7 g/dL (ref 6.4–8.2)
Sodium: 141 mmol/L (ref 136–145)

## 2017-09-06 LAB — CBC WITH AUTOMATED DIFF
ABS. BASOPHILS: 0.1 10*3/uL (ref 0.0–0.1)
ABS. EOSINOPHILS: 0.7 10*3/uL — ABNORMAL HIGH (ref 0.0–0.4)
ABS. IMM. GRANS.: 0 10*3/uL (ref 0.00–0.04)
ABS. LYMPHOCYTES: 3.1 10*3/uL (ref 0.8–3.5)
ABS. MONOCYTES: 0.5 10*3/uL (ref 0.0–1.0)
ABS. NEUTROPHILS: 4.4 10*3/uL (ref 1.8–8.0)
ABSOLUTE NRBC: 0 10*3/uL (ref 0.00–0.01)
BASOPHILS: 1 % (ref 0–1)
EOSINOPHILS: 8 % — ABNORMAL HIGH (ref 0–7)
HCT: 39.9 % (ref 35.0–47.0)
HGB: 13.9 g/dL (ref 11.5–16.0)
IMMATURE GRANULOCYTES: 0 % (ref 0.0–0.5)
LYMPHOCYTES: 35 % (ref 12–49)
MCH: 32.3 PG (ref 26.0–34.0)
MCHC: 34.8 g/dL (ref 30.0–36.5)
MCV: 92.8 FL (ref 80.0–99.0)
MONOCYTES: 6 % (ref 5–13)
MPV: 10.2 FL (ref 8.9–12.9)
NEUTROPHILS: 50 % (ref 32–75)
NRBC: 0 PER 100 WBC
PLATELET: 259 10*3/uL (ref 150–400)
RBC: 4.3 M/uL (ref 3.80–5.20)
RDW: 11.9 % (ref 11.5–14.5)
WBC: 8.7 10*3/uL (ref 3.6–11.0)

## 2017-09-06 LAB — POC LACTIC ACID: Lactic Acid (POC): 1.63 mmol/L (ref 0.40–2.00)

## 2017-09-06 LAB — SAMPLES BEING HELD

## 2017-09-06 MED ORDER — KETOROLAC TROMETHAMINE 30 MG/ML INJECTION
30 mg/mL (1 mL) | INTRAMUSCULAR | Status: AC
Start: 2017-09-06 — End: 2017-09-06
  Administered 2017-09-07: via INTRAVENOUS

## 2017-09-06 MED ORDER — HYDROCODONE-ACETAMINOPHEN 7.5 MG-325 MG TAB
ORAL | Status: AC
Start: 2017-09-06 — End: 2017-09-06
  Administered 2017-09-07: via ORAL

## 2017-09-06 MED ORDER — KETOROLAC TROMETHAMINE 30 MG/ML INJECTION
30 mg/mL (1 mL) | Freq: Once | INTRAMUSCULAR | Status: AC
Start: 2017-09-06 — End: 2017-09-06
  Administered 2017-09-06: 23:00:00 via INTRAVENOUS

## 2017-09-06 MED ORDER — SODIUM CHLORIDE 0.9 % IV
Freq: Once | INTRAVENOUS | Status: AC
Start: 2017-09-06 — End: 2017-09-06
  Administered 2017-09-06: 23:00:00 via INTRAVENOUS

## 2017-09-06 MED ORDER — MORPHINE 4 MG/ML INTRAVENOUS SOLUTION
4 mg/mL | Freq: Once | INTRAVENOUS | Status: AC
Start: 2017-09-06 — End: 2017-09-06
  Administered 2017-09-06: 23:00:00 via INTRAVENOUS

## 2017-09-06 MED ORDER — POTASSIUM CHLORIDE SR 20 MEQ TAB, PARTICLES/CRYSTALS
20 mEq | ORAL | Status: AC
Start: 2017-09-06 — End: 2017-09-06
  Administered 2017-09-07: via ORAL

## 2017-09-06 MED FILL — SODIUM CHLORIDE 0.9 % IV: INTRAVENOUS | Qty: 500

## 2017-09-06 MED FILL — KETOROLAC TROMETHAMINE 30 MG/ML INJECTION: 30 mg/mL (1 mL) | INTRAMUSCULAR | Qty: 1

## 2017-09-06 MED FILL — MORPHINE 4 MG/ML INTRAVENOUS SOLUTION: 4 mg/mL | INTRAVENOUS | Qty: 1

## 2017-09-06 NOTE — ED Notes (Signed)
Ultrasound at bedside

## 2017-09-06 NOTE — ED Provider Notes (Signed)
ED Provider Notes by Windy Kalata, MD at 09/06/17 2133                Author: Windy Kalata, MD  Service: --  Author Type: Physician       Filed: 09/07/17 0004  Date of Service: 09/06/17 2133  Status: Signed          Editor: Windy Kalata, MD (Physician)               EMERGENCY DEPARTMENT HISTORY AND PHYSICAL EXAM           Date: 09/06/2017   Patient Name: Christina Stevens   Patient Age and Sex: 37 y.o.  female         History of Presenting Illness          Chief Complaint       Patient presents with        ?  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  Patient states that she has been having multiple complaints in the past several days and feels overall malaise, but primarily has been having right leg  pain.  She states that she feels like she has a lesion coming up and has been having difficulty walking on this leg due to pain.  She also notes that she has significant swelling to this lower extremity.  The patient was also complaining of overall malaise,  and a feeling of palpitations.  She has been intermittently on antibiotics for MRSA skin infections and also had a admission in January for incision and drainage of a facial wound.  She denies any fevers, vomiting.  She is on chronic opioids at home due  to pain related to the MRSA infections.  Patient has follow-up with her ID physician tomorrow.      There are no other complaints, changes, or physical findings at this time.      PCP: Sherley Bounds, NP        No current facility-administered medications on file prior to encounter.           Current Outpatient Medications on File Prior to Encounter          Medication  Sig  Dispense  Refill           ?  HYDROmorphone (DILAUDID) 2 mg tablet  Take 1 Tab by mouth every  six (6) hours as needed. Max Daily Amount: 8 mg.  14 Tab  0     ?  nystatin (MYCOSTATIN) 100,000 unit/mL suspension  Take 5 mL by mouth four (4) times daily. swish and spit  60 mL  0     ?  senna-docusate (PERICOLACE) 8.6-50 mg per tablet  Take 1-2 Tabs by mouth daily as needed for Constipation.  20 Tab  0     ?  L.acidoph & parac-S.therm-Bifido (FLORA Q2/RISAQUAD-2) 16 billion cell cap cap  Take 1 Cap by mouth daily.  30 Cap  0     ?  ondansetron hcl (ZOFRAN) 4 mg tablet  Take 1 Tab by mouth every eight (8) hours as needed for Nausea.  14 Tab  0           ?  dextroamphetamine-amphetamine (ADDERALL) 20 mg tablet  Take 40 mg by mouth daily.                 Past History        Past Medical History:     Past Medical History:        Diagnosis  Date         ?  ADHD           ?  PCOS (polycystic ovarian syndrome)             Past Surgical History:     Past Surgical History:         Procedure  Laterality  Date          ?  HX APPENDECTOMY              ?  IR CHOLECYSTOSTOMY PERCUTANEOUS               Family History:   History reviewed. No pertinent family history.      Social History:     Social History          Tobacco Use         ?  Smoking status:  Current Every Day Smoker     ?  Smokeless tobacco:  Never Used       Substance Use Topics         ?  Alcohol use:  No              Frequency:  Never         ?  Drug use:  Yes              Types:  Marijuana           Allergies:     Allergies        Allergen  Reactions         ?  Percocet [Oxycodone-Acetaminophen]  Itching                Review of Systems     Constitutional: No  fever,   +  headache   Skin: No  rash, No  jaundice   HEENT: No  nasal congestion, No   eye drainage.    Resp: No cough,  No  wheezing   CV: No chest pain, +  palpitations   GI: No vomiting,  No  diarrhea.,   No  constipation   GU: No dysuria,  No  hematuria   MSK: No joint pain,  No  trauma   Neuro: No numbness, No  tingling   Psych: No suicidal, No  paranoid           Physical Exam        Patient Vitals  for the past 12 hrs:            Temp  Pulse  Resp  BP  SpO2            09/06/17 1930  --  --  --  (!) 117/98  97 %            09/06/17 1748  97.9 ??F (36.6 ??C)  (!) 102  18  120/76  98 %           General: alert No acute distress   Eyes: EOMI, normal conjunctiva   ENT: moist mucous membranes.  TMs are normal bilaterally,  no lymphadenopathy old healing wounds to the submental area bilaterally with the larger on the  left side 4 cm x 2 cm, no purulent drainage or surrounding erythema   Neck: Active, full ROM of neck.,  No sublingual crepitus,   Skin: Multiple areas of scabbing to the arms and legs bilaterally, no skin erythema, or fluctuance             Lungs: Equal chest expansion.no respiratory distress.    Heart: tachycardic with a  regular rhythm     no peripheral edema     Abd:  non distended soft   Back: Full ROM   MSK: Tender to palpation at the right calf and thigh without significant erythema or pitting edema,    Neuro: alert  A&O X 4; normal speech;    Psych: Cooperative with exam; Appropriate mood and affect                  Diagnostic Study Results        Labs -         Recent Results (from the past 12 hour(s))     EKG, 12 LEAD, INITIAL          Collection Time: 09/06/17  6:43 PM         Result  Value  Ref Range            Ventricular Rate  86  BPM       Atrial Rate  86  BPM       P-R Interval  156  ms       QRS Duration  84  ms       Q-T Interval  360  ms       QTC Calculation (Bezet)  430  ms       Calculated P Axis  72  degrees       Calculated R Axis  65  degrees       Calculated T Axis  71  degrees       Diagnosis                 Normal sinus rhythm   Normal ECG   When compared with ECG of 13-Feb-2017 11:41,   Vent. rate has increased BY  35 BPM          CBC WITH AUTOMATED DIFF          Collection Time: 09/06/17  6:55 PM         Result  Value  Ref Range            WBC  8.7  3.6 - 11.0 K/uL       RBC  4.30  3.80 - 5.20 M/uL       HGB  13.9  11.5 - 16.0 g/dL       HCT  39.9  35.0 - 47.0 %       MCV  92.8   80.0 - 99.0 FL       MCH  32.3  26.0 - 34.0 PG       MCHC  34.8  30.0 - 36.5 g/dL       RDW  11.9  11.5 - 14.5 %       PLATELET  259  150 - 400 K/uL       MPV  10.2  8.9 - 12.9 FL       NRBC  0.0  0 PER 100 WBC  ABSOLUTE NRBC  0.00  0.00 - 0.01 K/uL       NEUTROPHILS  50  32 - 75 %       LYMPHOCYTES  35  12 - 49 %       MONOCYTES  6  5 - 13 %       EOSINOPHILS  8 (H)  0 - 7 %       BASOPHILS  1  0 - 1 %       IMMATURE GRANULOCYTES  0  0.0 - 0.5 %       ABS. NEUTROPHILS  4.4  1.8 - 8.0 K/UL       ABS. LYMPHOCYTES  3.1  0.8 - 3.5 K/UL       ABS. MONOCYTES  0.5  0.0 - 1.0 K/UL       ABS. EOSINOPHILS  0.7 (H)  0.0 - 0.4 K/UL       ABS. BASOPHILS  0.1  0.0 - 0.1 K/UL       ABS. IMM. GRANS.  0.0  0.00 - 0.04 K/UL       DF  AUTOMATED          METABOLIC PANEL, COMPREHENSIVE          Collection Time: 09/06/17  6:55 PM         Result  Value  Ref Range            Sodium  141  136 - 145 mmol/L       Potassium  2.9 (L)  3.5 - 5.1 mmol/L       Chloride  107  97 - 108 mmol/L       CO2  28  21 - 32 mmol/L       Anion gap  6  5 - 15 mmol/L       Glucose  118 (H)  65 - 100 mg/dL       BUN  9  6 - 20 MG/DL       Creatinine  0.86  0.55 - 1.02 MG/DL       BUN/Creatinine ratio  10 (L)  12 - 20         GFR est AA  >60  >60 ml/min/1.70m       GFR est non-AA  >60  >60 ml/min/1.770m      Calcium  8.6  8.5 - 10.1 MG/DL       Bilirubin, total  0.4  0.2 - 1.0 MG/DL       ALT (SGPT)  17  12 - 78 U/L       AST (SGOT)  13 (L)  15 - 37 U/L       Alk. phosphatase  57  45 - 117 U/L       Protein, total  6.7  6.4 - 8.2 g/dL       Albumin  3.4 (L)  3.5 - 5.0 g/dL       Globulin  3.3  2.0 - 4.0 g/dL       A-G Ratio  1.0 (L)  1.1 - 2.2         SAMPLES BEING HELD          Collection Time: 09/06/17  6:55 PM         Result  Value  Ref Range            SAMPLES BEING HELD  BLUE  COMMENT                  Add-on orders for these samples will be processed based on acceptable specimen integrity and analyte stability, which may vary by analyte.        POC LACTIC ACID          Collection Time: 09/06/17  7:01 PM         Result  Value  Ref Range            Lactic Acid (POC)  1.63  0.40 - 2.00 mmol/L       URINALYSIS W/ REFLEX CULTURE          Collection Time: 09/06/17  8:47 PM         Result  Value  Ref Range            Color  YELLOW/STRAW          Appearance  CLEAR  CLEAR         Specific gravity  1.013  1.003 - 1.030         pH (UA)  6.5  5.0 - 8.0         Protein  NEGATIVE   NEG mg/dL       Glucose  NEGATIVE   NEG mg/dL       Ketone  NEGATIVE   NEG mg/dL       Bilirubin  NEGATIVE   NEG         Blood  NEGATIVE   NEG         Urobilinogen  0.2  0.2 - 1.0 EU/dL       Nitrites  NEGATIVE   NEG         Leukocyte Esterase  NEGATIVE   NEG         WBC  0-4  0 - 4 /hpf       RBC  0-5  0 - 5 /hpf       Epithelial cells  FEW  FEW /lpf       Bacteria  NEGATIVE   NEG /hpf            UA:UC IF INDICATED  CULTURE NOT INDICATED BY UA RESULT  CNI             Radiologic Studies -      No orders to display          CT Results   (Last 48 hours)          None                 CXR Results   (Last 48 hours)          None                       Medical Decision Making     Differential Diagnosis: Cellulitis, DVT, MRSA, electrolyte abnormality      I reviewed the vital signs, available nursing notes, past medical history, past surgical history, family history and social history and old medical records.   On my interpretation, Laboratory workup is significant for White blood cell count is 8.7, hemoglobin is 13.9, unremarkable bilateral except for potassium 2.9, lactic acid of 1.63, negative urinalysis   On my interpretation of the radiology studies DVT ultrasound of the right lower exam is negative for DVT   On my interpretation of the EKG Rate of 86, normal sinus rhythm, no  evidence of acute ischemic changes or arrhythmia      Management/ED course: Patient presents with right leg pain in the setting of prior history of MRSA infections.  Patient is afebrile, initially  slightly tachycardic,  but this improved with 100 cc of IV fluid.  Patient was treated with IV analgesics as well as oral analgesics which did improve her pain significantly.  The patient has no evidence of DVT on ultrasound, and no evidence of cellulitis  on my exam.  The patient has a follow-up with her infectious disease physician tomorrow and I encouraged her to follow-up with them as planned.  She continues have severe pain with walking and I encouraged her to use analgesics, p.o. for baseline control  of her pain as well as breakthrough.                Dispo: discharged. The patient has been re-evaluated and is ready for discharge. Reviewed available results with patient. Counseled patient  on diagnosis and care plan. Patient has expressed understanding, and all questions have been answered. Patient agrees with plan and agrees to follow up as recommended, or to return to the ED if their symptoms worsen. Discharge instructions have been provided  and explained to the patient, along with reasons to return to the ED.             PLAN:     Discharge Medication List as of 09/06/2017  9:05 PM              START taking these medications          Details        oxyCODONE IR (ROXICODONE) 5 mg immediate release tablet  Take 1 Tab by mouth every six (6) hours as needed for Pain (breakthrough, severe pain)  for up to 3 days. Max Daily Amount: 20 mg., Print, Disp-12 Tab, R-0                     CONTINUE these medications which have NOT CHANGED          Details        HYDROmorphone (DILAUDID) 2 mg tablet  Take 1 Tab by mouth every six (6) hours as needed. Max Daily Amount: 8 mg., Print, Disp-14  Tab, R-0               nystatin (MYCOSTATIN) 100,000 unit/mL suspension  Take 5 mL by mouth four (4) times daily. swish and spit, Print, Disp-60 mL, R-0               senna-docusate (PERICOLACE) 8.6-50 mg per tablet  Take 1-2 Tabs by mouth daily as needed for Constipation., Print, Disp-20 Tab, R-0               L.acidoph & parac-S.therm-Bifido (FLORA  Q2/RISAQUAD-2) 16 billion cell cap cap  Take 1 Cap by mouth daily., Print, Disp-30 Cap, R-0               ondansetron hcl (ZOFRAN) 4 mg tablet  Take 1 Tab by mouth every eight (8) hours as needed for Nausea., Print, Disp-14 Tab, R-0               dextroamphetamine-amphetamine (ADDERALL) 20 mg tablet  Take 40 mg by mouth daily., Historical Med                      2.      Follow-up Information  Follow up With  Specialties  Details  Why  Contact Info              Coggin, Nadara Mode, NP  Nurse Practitioner      [place] [address]   [phone]                3.  Return to ED if worse         Diagnosis        Clinical Impression:       1.  Right leg pain         2.  Arthralgia of right lower leg            Attestations:      Waldon Merl MD

## 2017-09-06 NOTE — ED Provider Notes (Signed)
EMERGENCY DEPARTMENT HISTORY AND PHYSICAL EXAM      Date: 09/06/2017  Patient Name: Christina Stevens  Patient Age and Sex: 37 y.o. female     History of Presenting Illness     Chief Complaint   Patient presents with   ??? 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  Patient states that she has been having multiple complaints in the past several days and feels overall malaise, but primarily has been having right leg pain.  She states that she feels like she has a lesion coming up and has been having difficulty walking on this leg due to pain.  She also notes that she has significant swelling to this lower extremity.  The patient was also complaining of overall malaise, and a feeling of palpitations.  She has been intermittently on antibiotics for MRSA skin infections and also had a admission in January for incision and drainage of a facial wound.  She denies any fevers, vomiting.  She is on chronic opioids at home due to pain related to the MRSA infections.  Patient has follow-up with her ID physician tomorrow.    There are no other complaints, changes, or physical findings at this time.    PCP: Sherley Bounds, NP    No current facility-administered medications on file prior to encounter.      Current Outpatient Medications on File Prior to Encounter   Medication Sig Dispense Refill   ??? HYDROmorphone (DILAUDID) 2 mg tablet Take 1 Tab by mouth every six (6) hours as needed. Max Daily Amount: 8 mg. 14 Tab 0   ??? nystatin (MYCOSTATIN) 100,000 unit/mL suspension Take 5 mL by mouth four (4) times daily. swish and spit 60 mL 0   ??? senna-docusate (PERICOLACE) 8.6-50 mg per tablet Take 1-2 Tabs by mouth daily as needed for Constipation. 20 Tab 0    ??? L.acidoph & parac-S.therm-Bifido (FLORA Q2/RISAQUAD-2) 16 billion cell cap cap Take 1 Cap by mouth daily. 30 Cap 0   ??? ondansetron hcl (ZOFRAN) 4 mg tablet Take 1 Tab by mouth every eight (8) hours as needed for Nausea. 14 Tab 0   ??? dextroamphetamine-amphetamine (ADDERALL) 20 mg tablet Take 40 mg by mouth daily.         Past History     Past Medical History:  Past Medical History:   Diagnosis Date   ??? ADHD    ??? PCOS (polycystic ovarian syndrome)        Past Surgical History:  Past Surgical History:   Procedure Laterality Date   ??? HX APPENDECTOMY     ??? IR CHOLECYSTOSTOMY PERCUTANEOUS         Family History:  History reviewed. No pertinent family history.    Social History:  Social History     Tobacco Use   ??? Smoking status: Current Every Day Smoker   ??? Smokeless tobacco: Never Used   Substance Use Topics   ??? Alcohol use: No     Frequency: Never   ??? Drug use: Yes     Types: Marijuana       Allergies:  Allergies   Allergen Reactions   ??? Percocet [Oxycodone-Acetaminophen] Itching         Review  of Systems   Constitutional: No  fever,  +  headache  Skin: No  rash, No  jaundice  HEENT: No  nasal congestion, No  eye drainage.   Resp: No cough,  No  wheezing  CV: No chest pain, +  palpitations  GI: No vomiting,  No  diarrhea.,  No  constipation  GU: No dysuria,  No  hematuria  MSK: No joint pain,  No  trauma  Neuro: No numbness, No  tingling  Psych: No suicidal, No  paranoid      Physical Exam     Patient Vitals for the past 12 hrs:   Temp Pulse Resp BP SpO2   09/06/17 1930 ??? ??? ??? (!) 117/98 97 %   09/06/17 1748 97.9 ??F (36.6 ??C) (!) 102 18 120/76 98 %       General: alert No acute distress  Eyes: EOMI, normal conjunctiva  ENT: moist mucous membranes.  TMs are normal bilaterally, no lymphadenopathy old healing wounds to the submental area bilaterally with the larger on the left side 4 cm x 2 cm, no purulent drainage or surrounding erythema  Neck: Active, full ROM of neck.,  No sublingual crepitus,   Skin: Multiple areas of scabbing to the arms and legs bilaterally, no skin erythema, or fluctuance            Lungs: Equal chest expansion.no respiratory distress.   Heart: tachycardic with a  regular rhythm     no peripheral edema    Abd:  non distended soft  Back: Full ROM  MSK: Tender to palpation at the right calf and thigh without significant erythema or pitting edema,   Neuro: alert  A&O X 4; normal speech;   Psych: Cooperative with exam; Appropriate mood and affect             Diagnostic Study Results     Labs -     Recent Results (from the past 12 hour(s))   EKG, 12 LEAD, INITIAL    Collection Time: 09/06/17  6:43 PM   Result Value Ref Range    Ventricular Rate 86 BPM    Atrial Rate 86 BPM    P-R Interval 156 ms    QRS Duration 84 ms    Q-T Interval 360 ms    QTC Calculation (Bezet) 430 ms    Calculated P Axis 72 degrees    Calculated R Axis 65 degrees    Calculated T Axis 71 degrees    Diagnosis       Normal sinus rhythm  Normal ECG  When compared with ECG of 13-Feb-2017 11:41,  Vent. rate has increased BY  35 BPM     CBC WITH AUTOMATED DIFF    Collection Time: 09/06/17  6:55 PM   Result Value Ref Range    WBC 8.7 3.6 - 11.0 K/uL    RBC 4.30 3.80 - 5.20 M/uL    HGB 13.9 11.5 - 16.0 g/dL    HCT 39.9 35.0 - 47.0 %    MCV 92.8 80.0 - 99.0 FL    MCH 32.3 26.0 - 34.0 PG    MCHC 34.8 30.0 - 36.5 g/dL    RDW 11.9 11.5 - 14.5 %    PLATELET 259 150 - 400 K/uL    MPV 10.2 8.9 - 12.9 FL    NRBC 0.0 0 PER 100 WBC    ABSOLUTE NRBC 0.00 0.00 - 0.01 K/uL    NEUTROPHILS 50 32 - 75 %  LYMPHOCYTES 35 12 - 49 %    MONOCYTES 6 5 - 13 %    EOSINOPHILS 8 (H) 0 - 7 %    BASOPHILS 1 0 - 1 %    IMMATURE GRANULOCYTES 0 0.0 - 0.5 %    ABS. NEUTROPHILS 4.4 1.8 - 8.0 K/UL    ABS. LYMPHOCYTES 3.1 0.8 - 3.5 K/UL    ABS. MONOCYTES 0.5 0.0 - 1.0 K/UL    ABS. EOSINOPHILS 0.7 (H) 0.0 - 0.4 K/UL    ABS. BASOPHILS 0.1 0.0 - 0.1 K/UL    ABS. IMM. GRANS. 0.0 0.00 - 0.04 K/UL    DF AUTOMATED     METABOLIC PANEL, COMPREHENSIVE     Collection Time: 09/06/17  6:55 PM   Result Value Ref Range    Sodium 141 136 - 145 mmol/L    Potassium 2.9 (L) 3.5 - 5.1 mmol/L    Chloride 107 97 - 108 mmol/L    CO2 28 21 - 32 mmol/L    Anion gap 6 5 - 15 mmol/L    Glucose 118 (H) 65 - 100 mg/dL    BUN 9 6 - 20 MG/DL    Creatinine 0.86 0.55 - 1.02 MG/DL    BUN/Creatinine ratio 10 (L) 12 - 20      GFR est AA >60 >60 ml/min/1.65m    GFR est non-AA >60 >60 ml/min/1.771m   Calcium 8.6 8.5 - 10.1 MG/DL    Bilirubin, total 0.4 0.2 - 1.0 MG/DL    ALT (SGPT) 17 12 - 78 U/L    AST (SGOT) 13 (L) 15 - 37 U/L    Alk. phosphatase 57 45 - 117 U/L    Protein, total 6.7 6.4 - 8.2 g/dL    Albumin 3.4 (L) 3.5 - 5.0 g/dL    Globulin 3.3 2.0 - 4.0 g/dL    A-G Ratio 1.0 (L) 1.1 - 2.2     SAMPLES BEING HELD    Collection Time: 09/06/17  6:55 PM   Result Value Ref Range    SAMPLES BEING HELD BLUE     COMMENT        Add-on orders for these samples will be processed based on acceptable specimen integrity and analyte stability, which may vary by analyte.   POC LACTIC ACID    Collection Time: 09/06/17  7:01 PM   Result Value Ref Range    Lactic Acid (POC) 1.63 0.40 - 2.00 mmol/L   URINALYSIS W/ REFLEX CULTURE    Collection Time: 09/06/17  8:47 PM   Result Value Ref Range    Color YELLOW/STRAW      Appearance CLEAR CLEAR      Specific gravity 1.013 1.003 - 1.030      pH (UA) 6.5 5.0 - 8.0      Protein NEGATIVE  NEG mg/dL    Glucose NEGATIVE  NEG mg/dL    Ketone NEGATIVE  NEG mg/dL    Bilirubin NEGATIVE  NEG      Blood NEGATIVE  NEG      Urobilinogen 0.2 0.2 - 1.0 EU/dL    Nitrites NEGATIVE  NEG      Leukocyte Esterase NEGATIVE  NEG      WBC 0-4 0 - 4 /hpf    RBC 0-5 0 - 5 /hpf    Epithelial cells FEW FEW /lpf    Bacteria NEGATIVE  NEG /hpf    UA:UC IF INDICATED CULTURE NOT INDICATED BY UA RESULT CNI  Radiologic Studies -   No orders to display     CT Results  (Last 48 hours)    None        CXR Results  (Last 48 hours)    None            Medical Decision Making    Differential Diagnosis: Cellulitis, DVT, MRSA, electrolyte abnormality    I reviewed the vital signs, available nursing notes, past medical history, past surgical history, family history and social history and old medical records.  On my interpretation, Laboratory workup is significant for White blood cell count is 8.7, hemoglobin is 13.9, unremarkable bilateral except for potassium 2.9, lactic acid of 1.63, negative urinalysis  On my interpretation of the radiology studies DVT ultrasound of the right lower exam is negative for DVT  On my interpretation of the EKG Rate of 86, normal sinus rhythm, no evidence of acute ischemic changes or arrhythmia    Management/ED course: Patient presents with right leg pain in the setting of prior history of MRSA infections.  Patient is afebrile, initially slightly tachycardic, but this improved with 100 cc of IV fluid.  Patient was treated with IV analgesics as well as oral analgesics which did improve her pain significantly.  The patient has no evidence of DVT on ultrasound, and no evidence of cellulitis on my exam.  The patient has a follow-up with her infectious disease physician tomorrow and I encouraged her to follow-up with them as planned.  She continues have severe pain with walking and I encouraged her to use analgesics, p.o. for baseline control of her pain as well as breakthrough.           Dispo: discharged. The patient has been re-evaluated and is ready for discharge. Reviewed available results with patient. Counseled patient on diagnosis and care plan. Patient has expressed understanding, and all questions have been answered. Patient agrees with plan and agrees to follow up as recommended, or to return to the ED if their symptoms worsen. Discharge instructions have been provided and explained to the patient, along with reasons to return to the ED.         PLAN:  Discharge Medication List as of 09/06/2017  9:05 PM      START taking these medications    Details    oxyCODONE IR (ROXICODONE) 5 mg immediate release tablet Take 1 Tab by mouth every six (6) hours as needed for Pain (breakthrough, severe pain) for up to 3 days. Max Daily Amount: 20 mg., Print, Disp-12 Tab, R-0         CONTINUE these medications which have NOT CHANGED    Details   HYDROmorphone (DILAUDID) 2 mg tablet Take 1 Tab by mouth every six (6) hours as needed. Max Daily Amount: 8 mg., Print, Disp-14 Tab, R-0      nystatin (MYCOSTATIN) 100,000 unit/mL suspension Take 5 mL by mouth four (4) times daily. swish and spit, Print, Disp-60 mL, R-0      senna-docusate (PERICOLACE) 8.6-50 mg per tablet Take 1-2 Tabs by mouth daily as needed for Constipation., Print, Disp-20 Tab, R-0      L.acidoph & parac-S.therm-Bifido (FLORA Q2/RISAQUAD-2) 16 billion cell cap cap Take 1 Cap by mouth daily., Print, Disp-30 Cap, R-0      ondansetron hcl (ZOFRAN) 4 mg tablet Take 1 Tab by mouth every eight (8) hours as needed for Nausea., Print, Disp-14 Tab, R-0      dextroamphetamine-amphetamine (ADDERALL) 20 mg tablet Take 40 mg by  mouth daily., Historical Med           2.   Follow-up Information     Follow up With Specialties Details Why Contact Info    Coggin, Nadara Mode, NP Nurse Practitioner   [address] [address]  [phone]          3.  Return to ED if worse     Diagnosis     Clinical Impression:   1. Right leg pain    2. Arthralgia of right lower leg        Attestations:    Waldon Merl MD

## 2017-09-06 NOTE — ED Notes (Signed)
Dr. Wood at bedside to provide discharge paperwork to patient. Vital signs stable. Patient in no apparent distress at this time. Mental status at baseline.Accompanied by wife.

## 2017-09-06 NOTE — ED Notes (Signed)
Assumed care of pt from triage. Pt reports having multiple "rashes" on her arms and legs and a bigger rash on her left chin and a smaller spot on her right chin. Pt states in January of this year pt went to hospital and was diagnosed with staph and ended up being positive for MRSA and was hospitalized for 11 days. Pt states today she is feeling very "hot" and has a lot of "pressure" on her head/chin/neck area. Pt states she also is having SOB, chest pain, and dizziness. Pt states she has been on and off antibiotics since January. Pt also stated that she stepped on a rusty nail 2 weeks ago and had to get a Tetanus shot. Since then she has decreased ROM and pain in her left arm. Pt has intense pain to her right leg there is swelling noted to BLE. Pt states she has been using Mupirocin PRN but rashes still will not heal. Pt states she has an appointment with Infectious Disease in a few days as well.

## 2017-09-06 NOTE — ED Notes (Signed)
Dr. Lucretia RoersWood at bedside to provide discharge paperwork to patient. Vital signs stable. Patient in no apparent distress at this time. Mental status at baseline.Accompanied by wife.

## 2017-09-06 NOTE — ED Notes (Signed)
 Assumed care of pt from triage. Pt reports having multiple rashes on her arms and legs and a bigger rash on her left chin and a smaller spot on her right chin. Pt states in January of this year pt went to hospital and was diagnosed with staph and ended up being positive for MRSA and was hospitalized for 11 days. Pt states today she is feeling very hot and has a lot of pressure on her head/chin/neck area. Pt states she also is having SOB, chest pain, and dizziness. Pt states she has been on and off antibiotics since January. Pt also stated that she stepped on a rusty nail 2 weeks ago and had to get a Tetanus shot. Since then she has decreased ROM and pain in her left arm. Pt has intense pain to her right leg there is swelling noted to BLE. Pt states she has been using Mupirocin PRN but rashes still will not heal. Pt states she has an appointment with Infectious Disease in a few days as well.

## 2017-09-07 ENCOUNTER — Encounter

## 2017-09-07 ENCOUNTER — Inpatient Hospital Stay: Admit: 2017-09-07 | Payer: BLUE CROSS/BLUE SHIELD | Primary: Primary Care

## 2017-09-07 DIAGNOSIS — M79604 Pain in right leg: Secondary | ICD-10-CM

## 2017-09-07 LAB — URINALYSIS W/ REFLEX CULTURE
BACTERIA, URINE: NEGATIVE /hpf
Bacteria: NEGATIVE /hpf
Bilirubin, Urine: NEGATIVE
Bilirubin: NEGATIVE
Blood, Urine: NEGATIVE
Blood: NEGATIVE
Glucose, Ur: NEGATIVE mg/dL
Glucose: NEGATIVE mg/dL
Ketone: NEGATIVE mg/dL
Ketones, Urine: NEGATIVE mg/dL
Leukocyte Esterase, Urine: NEGATIVE
Leukocyte Esterase: NEGATIVE
Nitrite, Urine: NEGATIVE
Nitrites: NEGATIVE
Protein, UA: NEGATIVE mg/dL
Protein: NEGATIVE mg/dL
Specific Gravity, UA: 1.013 (ref 1.003–1.030)
Specific gravity: 1.013 (ref 1.003–1.030)
Urobilinogen, UA, POCT: 0.2 EU/dL (ref 0.2–1.0)
Urobilinogen: 0.2 EU/dL (ref 0.2–1.0)
pH (UA): 6.5 (ref 5.0–8.0)
pH, UA: 6.5 (ref 5.0–8.0)

## 2017-09-07 LAB — EKG 12-LEAD
Atrial Rate: 86 {beats}/min
Diagnosis: NORMAL
P Axis: 72 degrees
P-R Interval: 156 ms
Q-T Interval: 360 ms
QRS Duration: 84 ms
QTc Calculation (Bazett): 430 ms
R Axis: 65 degrees
T Axis: 71 degrees
Ventricular Rate: 86 {beats}/min

## 2017-09-07 LAB — EKG, 12 LEAD, INITIAL
Atrial Rate: 86 {beats}/min
Calculated P Axis: 72 degrees
Calculated R Axis: 65 degrees
Calculated T Axis: 71 degrees
Diagnosis: NORMAL
P-R Interval: 156 ms
Q-T Interval: 360 ms
QRS Duration: 84 ms
QTC Calculation (Bezet): 430 ms
Ventricular Rate: 86 {beats}/min

## 2017-09-07 MED ORDER — OXYCODONE 5 MG TAB
5 mg | ORAL_TABLET | Freq: Four times a day (QID) | ORAL | 0 refills | Status: AC | PRN
Start: 2017-09-07 — End: 2017-09-09

## 2017-09-07 MED FILL — HYDROCODONE-ACETAMINOPHEN 7.5 MG-325 MG TAB: ORAL | Qty: 1

## 2017-09-07 MED FILL — KETOROLAC TROMETHAMINE 30 MG/ML INJECTION: 30 mg/mL (1 mL) | INTRAMUSCULAR | Qty: 1

## 2017-09-07 MED FILL — POTASSIUM CHLORIDE SR 20 MEQ TAB, PARTICLES/CRYSTALS: 20 mEq | ORAL | Qty: 2

## 2017-09-11 LAB — CULTURE, BLOOD, PAIRED
Culture result:: NO GROWTH
Culture: NO GROWTH

## 2017-10-17 DIAGNOSIS — Z5321 Procedure and treatment not carried out due to patient leaving prior to being seen by health care provider: Secondary | ICD-10-CM

## 2017-10-17 NOTE — ED Notes (Signed)
 Pt leaving AMA, refusing to sign AMA form or wait for physician who is currently tied up in another emergency. Pt yelling at staff, You can't do that to people. I need to be seen. I can't breathe. Pt speaking in complete sentences, color appropriate for ethnicity, able to ambulate without assistance. Charge nurse aware. MD aware.

## 2017-10-17 NOTE — ED Notes (Signed)
Pt's O2 sats 96% on RA. Pt able to speak in complete sentences periodically. Family at bedside. VSS. Call bell within reach. Will speak with provider about pt condition. Will continue to monitor.

## 2017-10-17 NOTE — ED Triage Notes (Addendum)
Pt arrives via EMS from [facility] where she went for difficulty breathing. Pt breathing rapidly during triage, reporting she cannot slow her breathing down because her lower R back and mid sternal stabbing chest pain that radiates to the L shoulder and arm. EMS denies wheezing, reports VSS.    Pt did not finish her breathing treatment when she became agitated. Pt is yelling at staff during triage, stating that she cannot do what is asked of her because it is too hard.    Pt has history of asthma, diagnosed with MS last week. Will see a neurologist 10/31/17.

## 2017-10-17 NOTE — ED Notes (Addendum)
Pt leaving AMA, refusing to sign AMA form or wait for physician who is currently tied up in another emergency. Pt yelling at staff, "You can't do that to people. I need to be seen. I can't breathe." Pt speaking in complete sentences, color appropriate for ethnicity, able to ambulate without assistance. Charge nurse aware. MD aware.

## 2017-10-17 NOTE — ED Notes (Signed)
Provider aware of pt's condition, currently tied up with another emergency and will assess the pt as soon as possible. Pt's VSS. Call bell within reach. Will continue to monitor and assess.

## 2017-10-17 NOTE — ED Notes (Signed)
Pt's O2 sats 96% on RA. Pt able to speak in complete sentences periodically. Family at bedside. VSS. Call bell within reach. Will speak with provider about pt condition. Will continue to monitor.

## 2017-10-17 NOTE — ED Notes (Signed)
Pt arrives via EMS from [facility] where she went for difficulty breathing. Pt breathing rapidly during triage, reporting she cannot slow her breathing down because her lower R back and mid sternal stabbing chest pain that radiates to the L shoulder and arm. EMS denies wheezing, reports VSS.    Pt did not finish her breathing treatment when she became agitated. Pt is yelling at staff during triage, stating that she cannot do what is asked of her because it is too hard.    Pt has history of asthma, diagnosed with MS last week. Will see a neurologist 10/31/17.

## 2017-10-17 NOTE — ED Notes (Signed)
Provider aware of pt's condition, currently tied up with another emergency and will assess the pt as soon as possible. Pt's VSS. Call bell within reach. Will continue to monitor and assess.

## 2017-10-18 ENCOUNTER — Inpatient Hospital Stay: Admit: 2017-10-18 | Discharge: 2017-10-18 | Payer: BLUE CROSS/BLUE SHIELD | Attending: Emergency Medicine

## 2017-10-18 LAB — EKG 12-LEAD
Atrial Rate: 97 {beats}/min
Diagnosis: NORMAL
P Axis: 71 degrees
P-R Interval: 136 ms
Q-T Interval: 360 ms
QRS Duration: 88 ms
QTc Calculation (Bazett): 457 ms
R Axis: 80 degrees
T Axis: 66 degrees
Ventricular Rate: 97 {beats}/min

## 2017-10-18 LAB — EKG, 12 LEAD, INITIAL
Atrial Rate: 97 {beats}/min
Calculated P Axis: 71 degrees
Calculated R Axis: 80 degrees
Calculated T Axis: 66 degrees
Diagnosis: NORMAL
P-R Interval: 136 ms
Q-T Interval: 360 ms
QRS Duration: 88 ms
QTC Calculation (Bezet): 457 ms
Ventricular Rate: 97 {beats}/min

## 2017-11-02 ENCOUNTER — Encounter

## 2017-11-08 ENCOUNTER — Inpatient Hospital Stay: Admit: 2017-11-08 | Payer: BLUE CROSS/BLUE SHIELD | Attending: Rheumatology | Primary: Primary Care

## 2017-11-08 DIAGNOSIS — M6281 Muscle weakness (generalized): Secondary | ICD-10-CM

## 2020-07-03 NOTE — Procedures (Signed)
 Procedure  ECG 12 lead  Performed by: Haywood Filler, MD  Authorized by: Haywood Filler, MD     ECG reviewed by ED Physician in the absence of a cardiologist: yes    Interpretation:     Interpretation: normal    Rate:     ECG rate:  75    ECG rate assessment: normal    Rhythm:     Rhythm: sinus rhythm    QRS:     QRS axis:  Normal    QRS intervals:  Normal  Conduction:     Conduction: normal    ST segments:     ST segments:  Normal  T waves:     T waves: normal                   Haywood Filler, MD  07/03/20 2212

## 2020-07-04 NOTE — Case Communication (Signed)
 Requested to admit [facility] for intractable nausea, vomiting, unable to tolerate PO intake.  Patient was seen at bedside with stable vitals, labs, notes, imaging were reviewed, and in no acute finding.  Patient was upset, stating that "no body cares about me". She ripped out her IV line, stated that she wanted to leave the hospital as "My ride is outside". I asked her to wait for her nurse to come in assiting her with IV line removal, preparation for her discharge as she wanted to leave AMA.  Risk and benefit for this discharge had been discussed directly to patient and her partner at bedside, including asking Karyna to come back to ED for evaluation if any medical issues would arrive.  Patient was discharged before being able to perform any physical examination.

## 2020-07-27 NOTE — Procedures (Signed)
 Procedure  ECG 12 lead  Performed by: Toma Aran, MD  Authorized by: Toma Aran, MD     ECG reviewed by ED Physician in the absence of a cardiologist: yes    Interpretation:     Interpretation: normal    Quality:     Tracing quality:  Limited by artifact  Rate:     ECG rate:  94    ECG rate assessment: normal    Rhythm:     Rhythm: sinus rhythm    Ectopy:     Ectopy: none    QRS:     QRS axis:  Normal    QRS intervals:  Normal  Conduction:     Conduction: normal    ST segments:     ST segments:  Non-specific  T waves:     T waves: non-specific                   Toma Aran, MD  07/27/20 2044

## 2020-08-21 NOTE — Patient Instructions (Signed)
 [address]   Diagnosing Syncope   Syncope is when you suddenly faint or pass out (lose consciousness). It's caused by not getting enough blood flow to the brain. Syncope can have many causes. Some of them aren't serious, but others may be life-threatening. Your healthcare provider will ask you about your fainting episode and your past health. They'll also do an exam. You may need a number of tests to assess your symptoms.   Health history and exam   You may be asked about:   Where and when you fainted   How long you weren't awake and aware (unconscious)   How you felt just before and right after you fainted   How you feel when you do moderate to heavy exercise   If you have a family history of heart disease or fainting   If you have a heart or neurological problem   What medicines you're taking   If you drink alcohol   If you use illegal drugs   Your healthcare provider will examine you and may:   Check your blood pressure while lying down, sitting, and standing    Listen for any heart murmurs or abnormal heartbeats   Listen and feel the pulses in your neck   Examine your eyes, reflexes, and limb movement       Tests       Holter monitor    You may need 1 of more of the following tests:   Electrocardiogram (ECG). This test can help find a slow, fast, or irregular heartbeat.   Holter monitoring. You wear a portable ECG monitor for 24 to 72 hours. It records your heartbeat and looks for any problems.   Event monitoring. You wear a portable ECG monitor for several weeks. Like a Holter monitor, it records your heartbeat and looks for any problems.   Implantable loop recorder. This is a small heart monitor that's inserted over the heart under the skin. It's used for long-term heart rhythm recordings, usually up to 3 years.   Echocardiogram. This test takes pictures of your heart with ultrasound. It can show heart valve or heart function problems. It can also show damage from a heart attack.   Electrophysiology studies  (EPS). These help find weak, damaged, or overactive electrical pathways that make your heart beat too fast or slow. They can find the cause of a heart rate or rhythm problem. They can also help your provider decide how to treat it.   Tilt table testing. This testing helps show if changes in your body position affect your heart rate and blood pressure.   Carotid artery ultrasound. This test shows the blood flow through the arteries in the neck that supply oxygen and circulation to your brain. It can find any blockages in these arteries.   MRI or CT scan of the neck and brain. These tests see if there is something else going on in the brain that is causing a loss of consciousness. They scan the blood flow and brain structures. They are done in a radiology department.   Blood and other lab tests. These tests are used to find any problems in your body that may cause syncope. For example, low blood sugar can cause a loss of consciousness. It may be mistaken for syncope. Other wastes and toxins, and even dehydration, can affect brain function and consciousness.   Electroencephalogram (EEG). If your provider thinks you may be having seizures instead of syncope, this test may be done. Electrodes are put  on your scalp. They measure the activity of the brain. A neurologist who specializes in reading EEGs studies the results.   You may need to see other specialists to fully assess the cause of your syncope. For instance, you may see a cardiologist, a neurologist, or an ear, nose, and throat doctor (ENT or otolaryngologist). Treatment will be based on the cause of your syncope. Ask your provider how you'll get the test results and what steps must be taken.   One of the concerns with syncope is being injured if you faint. Finding a cause for syncope can help keep you safe from injury. Don't drive a car, use heavy machinery, or do activities that may result in injury if you were to faint. Ask your provider when it's safe to do  these activities again.     Last Reviewed Date: 11/02/2019    2000-2022 [employer], [place]. All rights reserved. This information is not intended as a substitute for professional medical care. Always follow your healthcare professional's instructions.

## 2020-08-21 NOTE — Patient Instructions (Signed)
 [address]   Causes of Syncope   Syncope (fainting) has many causes. Sometimes it's not serious. In other cases, it's a sign of a heart problem. But treatment can help.      How to say it   [id number]       When syncope is not serious   Most causes of syncope are not serious and may be related to:   Strong feelings, such as anxiety or fear. A nerve signal may briefly change your heart rate and lower your blood pressure too much.   Standing for too long. Standing may cause blood to pool in your legs. When this happens, your brain may not get all the blood it needs.   Standing up too fast. Your blood pressure may not adjust fast enough to changes in posture and may drop too low. Certain medicines can also cause this problem. Some medicines that can cause a drop in blood pressure include diuretics, blood pressure medicines, and medicines for chest pain.   Reaction to normal body functions. When you go to the bathroom, have gastrointestinal discomfort, upset stomach (nausea), or pain, your heart may have a natural reflex to slow down and lower blood pressure. This can result in syncope. This may also follow exercise, eating, laughter, weight lifting, or playing musical instruments like the trumpet or trombone.       When heart trouble causes syncope   A heart problem can lower the amount of oxygen-rich blood that gets to the brain. Heart trouble can be serious and even life-threatening if not treated. Some of these conditions include:   A slow heart rate. Electrical signals tell the chambers of the heart when to pump. But the signals may be slowed or blocked (heart block) as they travel on the heart's electrical pathways. This can be caused by aging, scarred heart tissue, or damage from heart disease. When the heart rate slows, not enough blood is pumped.   A fast heart rate. Some things can make the heart race. For instance, a heart attack can create abnormal electrical signals. These signals can make the heart  suddenly beat very fast. The heart pumps before the chambers can adequately fill with blood. So less blood gets to the brain and other parts of the body. Illegal drugs, certain medicines, heart disease, or an inherited condition can also cause this.   A heart valve problem. Blood travels through the chambers of the heart as it pumps. Heart valves open and close to help move blood in the right direction. But a hardened or scarred valve may not open or close fully. As a result, less blood is pumped through the heart to the brain and body. Most often, syncope occurs when a person's aortic valve is narrowed and they do strenuous activity.   A heart muscle problem. Some people develop a thickened heart muscle that blocks blood flow out of the heart to the body (called hypertrophic cardiomyopathy). Being dehydrated and having this condition can raise the risk for syncope.   A cardiovascular problem. Blood clots in your lungs, increased blood pressure in the vessels in the lungs, or injury to the vessel walls can also prevent proper flow of oxygenated blood to the brain.   Whatever the cause of syncope, it's important to see your healthcare provider. You may need to be seen by a cardiologist or neurologist. If you have syncope and haven't been seen by a provider yet, it's important to:   Not drive   Not use  heavy machinery   Not do activities where you could fall and hurt yourself         Last Reviewed Date: 11/02/2019    2000-2022 [employer], [place]. All rights reserved. This information is not intended as a substitute for professional medical care. Always follow your healthcare professional's instructions.

## 2020-08-21 NOTE — Patient Instructions (Signed)
 [address]   What Is Syncope?   Syncope is also known as fainting or a blackout. It's an abrupt and short-term loss of consciousness and motor tone. It's often caused by a sudden drop in blood flow to the brain or a lack of oxygen to the brain. It's then followed by complete and often rapid spontaneous recovery.       The heart pumps blood nonstop to the brain and the rest of the body.    Understanding heart rate and blood pressure changes   Your brain and body need a steady flow of oxygen-rich blood. Your heart rate and blood pressure change to keep that flow steady throughout all your activities.   The heart makes electrical signals that move through it on pathways. These signals set the heart rate. They also tell the heart when to pump blood.   In response to your body's needs, your brain may also trigger changes in your heart rate and blood pressure. Sensors in the body detect the amount of blood flow going to the brain and other parts of the body. If these sensors detect low blood flow, they signal the body to increase the amount of fluid in the blood vessels and increase the heart rate to provide more circulation.   The blood leaving the heart with each contraction supplies oxygen and nutrients as it flows in your brain.       Warning signs   Syncope often happens suddenly. Warning signs include:   Dimmed, blackened, or tunnel vision   Lightheadedness   Sleepiness   Rapid heartbeat   Some people may also feel nauseated or sweaty. But you may have no warning signs at all. After syncope, you get better quickly. But you may feel tired. Don't drive if you are having warning signs that you may faint.       Is it serious?   Syncope is a common problem with many possible causes. Often these causes are not serious. For instance, syncope can be caused by standing for too long or sitting up too fast. In some cases, you may never faint again. But if you have syncope with a heart problem, it can be a warning sign of a  more serious problem. For this reason, your healthcare provider may order several tests to look at heart function and rhythm. If you have had syncope, talk with your healthcare provider.   In older adults, syncope may be a sign that a heart attack has happened. Don't delay seeking treatment.   Even if the cause of syncope is not serious, there is a risk of injury from falling when you lose consciousness. When possible, finding a cause can reduce this risk.     Last Reviewed Date: 02/02/2020    2000-2022 [employer], [place]. All rights reserved. This information is not intended as a substitute for professional medical care. Always follow your healthcare professional's instructions.

## 2020-11-17 ENCOUNTER — Emergency Department: Admit: 2020-11-17 | Payer: BLUE CROSS/BLUE SHIELD | Primary: Primary Care

## 2020-11-17 ENCOUNTER — Inpatient Hospital Stay
Admit: 2020-11-17 | Discharge: 2020-11-24 | Disposition: A | Discharge status: Home Health | Payer: BLUE CROSS/BLUE SHIELD | Attending: Internal Medicine | Admitting: Internal Medicine

## 2020-11-17 DIAGNOSIS — J121 Respiratory syncytial virus pneumonia: Secondary | ICD-10-CM

## 2020-11-17 DIAGNOSIS — J189 Pneumonia, unspecified organism: Secondary | ICD-10-CM

## 2020-11-17 LAB — PROBNP, N-TERMINAL: BNP: 191 PG/ML — ABNORMAL HIGH (ref <125)

## 2020-11-17 LAB — CBC WITH AUTO DIFFERENTIAL
Basophils %: 0 % (ref 0–1)
Basophils Absolute: 0 10*3/uL (ref 0.0–0.1)
Eosinophils %: 0 % (ref 0–7)
Eosinophils Absolute: 0 10*3/uL (ref 0.0–0.4)
Granulocyte Absolute Count: 0.1 10*3/uL — ABNORMAL HIGH (ref 0.00–0.04)
Hematocrit: 42.1 % (ref 35.0–47.0)
Hemoglobin: 14.7 g/dL (ref 11.5–16.0)
Immature Granulocytes: 1 % — ABNORMAL HIGH (ref 0.0–0.5)
Lymphocytes %: 10 % — ABNORMAL LOW (ref 12–49)
Lymphocytes Absolute: 1 10*3/uL (ref 0.8–3.5)
MCH: 33.2 PG (ref 26.0–34.0)
MCHC: 34.9 g/dL (ref 30.0–36.5)
MCV: 95 FL (ref 80.0–99.0)
MPV: 10.6 FL (ref 8.9–12.9)
Monocytes %: 8 % (ref 5–13)
Monocytes Absolute: 0.9 10*3/uL (ref 0.0–1.0)
NRBC Absolute: 0 10*3/uL (ref 0.00–0.01)
Neutrophils %: 81 % — ABNORMAL HIGH (ref 32–75)
Neutrophils Absolute: 8.4 10*3/uL — ABNORMAL HIGH (ref 1.8–8.0)
Nucleated RBCs: 0 PER 100 WBC
Platelets: 234 10*3/uL (ref 150–400)
RBC: 4.43 M/uL (ref 3.80–5.20)
RDW: 12.8 % (ref 11.5–14.5)
WBC: 10.4 10*3/uL (ref 3.6–11.0)

## 2020-11-17 LAB — INFLUENZA A+B VIRAL AGS
Flu A Antigen: NEGATIVE
Influenza A Antigen: NEGATIVE
Influenza B Antigen: NEGATIVE
Influenza B Antigen: NEGATIVE

## 2020-11-17 LAB — COVID-19, RAPID: SARS-CoV-2, Rapid: NOT DETECTED

## 2020-11-17 LAB — COMPREHENSIVE METABOLIC PANEL
ALT: 35 U/L (ref 12–78)
AST: 25 U/L (ref 15–37)
Albumin/Globulin Ratio: 1 — ABNORMAL LOW (ref 1.1–2.2)
Albumin: 3.5 g/dL (ref 3.5–5.0)
Alkaline Phosphatase: 77 U/L (ref 45–117)
Anion Gap: 6 mmol/L (ref 5–15)
BUN: 7 MG/DL (ref 6–20)
Bun/Cre Ratio: 7 — ABNORMAL LOW (ref 12–20)
CO2: 25 mmol/L (ref 21–32)
Calcium: 8.2 MG/DL — ABNORMAL LOW (ref 8.5–10.1)
Chloride: 105 mmol/L (ref 97–108)
Creatinine: 0.94 MG/DL (ref 0.55–1.02)
ESTIMATED GLOMERULAR FILTRATION RATE: >60 mL/min/{1.73_m2} (ref >60)
Globulin: 3.4 g/dL (ref 2.0–4.0)
Glucose: 116 mg/dL — ABNORMAL HIGH (ref 65–100)
Potassium: 3.4 mmol/L — ABNORMAL LOW (ref 3.5–5.1)
Sodium: 136 mmol/L (ref 136–145)
Total Bilirubin: 0.4 MG/DL (ref 0.2–1.0)
Total Protein: 6.9 g/dL (ref 6.4–8.2)

## 2020-11-17 LAB — EKG 12-LEAD
Atrial Rate: 98 {beats}/min
Diagnosis: NORMAL
P Axis: 72 degrees
P-R Interval: 136 ms
Q-T Interval: 344 ms
QRS Duration: 88 ms
QTc Calculation (Bazett): 439 ms
R Axis: 78 degrees
T Axis: 66 degrees
Ventricular Rate: 98 {beats}/min

## 2020-11-17 LAB — TROPONIN, HIGH SENSITIVITY: Troponin, High Sensitivity: 5 ng/L (ref 0–51)

## 2020-11-17 LAB — COVID-19

## 2020-11-17 LAB — POCT LACTIC ACID: POC Lactic Acid: 2.22 mmol/L (ref 0.40–2.00)

## 2020-11-17 LAB — METABOLIC PANEL, COMPREHENSIVE
A-G Ratio: 1 — ABNORMAL LOW (ref 1.1–2.2)
ALT (SGPT): 35 U/L (ref 12–78)
AST (SGOT): 25 U/L (ref 15–37)
Albumin: 3.5 g/dL (ref 3.5–5.0)
Alk. phosphatase: 77 U/L (ref 45–117)
Anion gap: 6 mmol/L (ref 5–15)
BUN/Creatinine ratio: 7 — ABNORMAL LOW (ref 12–20)
BUN: 7 MG/DL (ref 6–20)
Bilirubin, total: 0.4 MG/DL (ref 0.2–1.0)
CO2: 25 mmol/L (ref 21–32)
Calcium: 8.2 MG/DL — ABNORMAL LOW (ref 8.5–10.1)
Chloride: 105 mmol/L (ref 97–108)
Creatinine: 0.94 MG/DL (ref 0.55–1.02)
Globulin: 3.4 g/dL (ref 2.0–4.0)
Glucose: 116 mg/dL — ABNORMAL HIGH (ref 65–100)
Potassium: 3.4 mmol/L — ABNORMAL LOW (ref 3.5–5.1)
Protein, total: 6.9 g/dL (ref 6.4–8.2)
Sodium: 136 mmol/L (ref 136–145)
eGFR: >60 mL/min/{1.73_m2} (ref >60)

## 2020-11-17 LAB — EKG, 12 LEAD, INITIAL
Atrial Rate: 98 {beats}/min
Calculated P Axis: 72 degrees
Calculated R Axis: 78 degrees
Calculated T Axis: 66 degrees
Diagnosis: NORMAL
P-R Interval: 136 ms
Q-T Interval: 344 ms
QRS Duration: 88 ms
QTC Calculation (Bezet): 439 ms
Ventricular Rate: 98 {beats}/min

## 2020-11-17 LAB — BLOOD GAS,CHEM8,LACTIC ACID POC
BICARBONATE: 27 mmol/L
Base excess (POC): 0.5 mmol/L
CO2, POC: 27 MMOL/L — ABNORMAL HIGH (ref 19–24)
Calcium, ionized (POC): 1.1 mmol/L — ABNORMAL LOW (ref 1.12–1.32)
Chloride, POC: 103 MMOL/L (ref 100–108)
Creatinine, POC: 0.7 MG/DL (ref 0.6–1.3)
Glucose, POC: 97 MG/DL (ref 74–106)
Lactic Acid (POC): 1.28 mmol/L (ref 0.40–2.00)
Potassium, POC: 3.5 MMOL/L (ref 3.5–5.5)
Sodium, POC: 141 MMOL/L (ref 136–145)
pCO2, venous (POC): 47.6 MMHG (ref 41–51)
pH, venous (POC): 7.36 (ref 7.32–7.42)
pO2, venous (POC): 26 mmHg (ref 25–40)

## 2020-11-17 LAB — CBC WITH AUTOMATED DIFF
ABS. BASOPHILS: 0 10*3/uL (ref 0.0–0.1)
ABS. EOSINOPHILS: 0 10*3/uL (ref 0.0–0.4)
ABS. IMM. GRANS.: 0.1 10*3/uL — ABNORMAL HIGH (ref 0.00–0.04)
ABS. LYMPHOCYTES: 1 10*3/uL (ref 0.8–3.5)
ABS. MONOCYTES: 0.9 10*3/uL (ref 0.0–1.0)
ABS. NEUTROPHILS: 8.4 10*3/uL — ABNORMAL HIGH (ref 1.8–8.0)
ABSOLUTE NRBC: 0 10*3/uL (ref 0.00–0.01)
BASOPHILS: 0 % (ref 0–1)
EOSINOPHILS: 0 % (ref 0–7)
HCT: 42.1 % (ref 35.0–47.0)
HGB: 14.7 g/dL (ref 11.5–16.0)
IMMATURE GRANULOCYTES: 1 % — ABNORMAL HIGH (ref 0.0–0.5)
LYMPHOCYTES: 10 % — ABNORMAL LOW (ref 12–49)
MCH: 33.2 PG (ref 26.0–34.0)
MCHC: 34.9 g/dL (ref 30.0–36.5)
MCV: 95 FL (ref 80.0–99.0)
MONOCYTES: 8 % (ref 5–13)
MPV: 10.6 FL (ref 8.9–12.9)
NEUTROPHILS: 81 % — ABNORMAL HIGH (ref 32–75)
NRBC: 0 PER 100 WBC
PLATELET: 234 10*3/uL (ref 150–400)
RBC: 4.43 M/uL (ref 3.80–5.20)
RDW: 12.8 % (ref 11.5–14.5)
WBC: 10.4 10*3/uL (ref 3.6–11.0)

## 2020-11-17 LAB — SARS-COV-2

## 2020-11-17 LAB — TROPONIN-HIGH SENSITIVITY: Troponin-High Sensitivity: 5 ng/L (ref 0–51)

## 2020-11-17 LAB — POC LACTIC ACID: Lactic Acid (POC): 2.22 mmol/L — CR (ref 0.40–2.00)

## 2020-11-17 LAB — COVID-19 RAPID TEST: COVID-19 rapid test: NOT DETECTED

## 2020-11-17 LAB — NT-PRO BNP: NT pro-BNP: 191 PG/ML — ABNORMAL HIGH (ref <125)

## 2020-11-17 MED ORDER — MORPHINE 2 MG/ML INJECTION
2 mg/mL | INTRAMUSCULAR | Status: DC | PRN
Start: 2020-11-17 — End: 2020-11-24
  Administered 2020-11-17 – 2020-11-20 (×17): via INTRAVENOUS

## 2020-11-17 MED ORDER — KETOROLAC TROMETHAMINE 30 MG/ML INJECTION
30 mg/mL (1 mL) | Freq: Once | INTRAMUSCULAR | Status: DC
Start: 2020-11-17 — End: 2020-11-17
  Administered 2020-11-17: 14:00:00 via INTRAMUSCULAR

## 2020-11-17 MED ORDER — AZITHROMYCIN 250 MG TAB
250 mg | ORAL | Status: AC
Start: 2020-11-17 — End: 2020-11-17
  Administered 2020-11-17: 21:00:00 via ORAL

## 2020-11-17 MED ORDER — DEXTROMETHORPHAN-GUAIFENESIN 10 MG-100 MG/5 ML SYRUP
100-105 mg/5 mL | Freq: Four times a day (QID) | ORAL | Status: DC | PRN
Start: 2020-11-17 — End: 2020-11-24
  Administered 2020-11-17 – 2020-11-22 (×6): via ORAL

## 2020-11-17 MED ORDER — AMPHETAMINE-DEXTROAMPHETAMINE 10 MG TAB
10 mg | Freq: Every day | ORAL | Status: AC
Start: 2020-11-17 — End: 2020-11-24
  Administered 2020-11-19: 13:00:00 via ORAL

## 2020-11-17 MED ORDER — POLYETHYLENE GLYCOL 3350 17 GRAM (100 %) ORAL POWDER PACKET
17 gram | Freq: Every day | ORAL | Status: DC | PRN
Start: 2020-11-17 — End: 2020-11-24

## 2020-11-17 MED ORDER — AZITHROMYCIN 500 MG IV SOLUTION
500 mg | INTRAVENOUS | Status: AC
Start: 2020-11-17 — End: 2020-11-30
  Administered 2020-11-18 – 2020-11-23 (×6): via INTRAVENOUS

## 2020-11-17 MED ORDER — PROCHLORPERAZINE EDISYLATE 5 MG/ML INJECTION
5 mg/mL | Freq: Four times a day (QID) | INTRAMUSCULAR | Status: AC | PRN
Start: 2020-11-17 — End: 2020-11-24

## 2020-11-17 MED ORDER — ONDANSETRON (PF) 4 MG/2 ML INJECTION
42 mg/2 mL | Freq: Four times a day (QID) | INTRAMUSCULAR | Status: DC | PRN
Start: 2020-11-17 — End: 2020-11-24
  Administered 2020-11-17 – 2020-11-24 (×4): via INTRAVENOUS

## 2020-11-17 MED ORDER — ALPRAZOLAM 0.25 MG TAB
0.25 mg | ORAL | Status: AC
Start: 2020-11-17 — End: 2020-11-17
  Administered 2020-11-17: 23:00:00 via ORAL

## 2020-11-17 MED ORDER — OXYCODONE 5 MG TAB
5 mg | ORAL | Status: AC
Start: 2020-11-17 — End: 2020-11-17
  Administered 2020-11-17: 23:00:00 via ORAL

## 2020-11-17 MED ORDER — ATORVASTATIN 20 MG TAB
20 mg | Freq: Every day | ORAL | Status: DC
Start: 2020-11-17 — End: 2020-11-24
  Administered 2020-11-18 – 2020-11-24 (×7): via ORAL

## 2020-11-17 MED ORDER — SODIUM CHLORIDE 0.9 % IJ SYRG
Freq: Three times a day (TID) | INTRAMUSCULAR | Status: DC
Start: 2020-11-17 — End: 2020-11-24
  Administered 2020-11-17 – 2020-11-24 (×22): via INTRAVENOUS

## 2020-11-17 MED ORDER — ONDANSETRON 4 MG TAB, RAPID DISSOLVE
4 mg | Freq: Three times a day (TID) | ORAL | Status: AC | PRN
Start: 2020-11-17 — End: 2020-11-24

## 2020-11-17 MED ORDER — ACETAMINOPHEN 325 MG TABLET
325 mg | Freq: Once | ORAL | Status: AC
Start: 2020-11-17 — End: 2020-11-17
  Administered 2020-11-17: 15:00:00 via ORAL

## 2020-11-17 MED ORDER — ACETAMINOPHEN 650 MG RECTAL SUPPOSITORY
650 mg | Freq: Four times a day (QID) | RECTAL | Status: AC | PRN
Start: 2020-11-17 — End: 2020-11-24

## 2020-11-17 MED ORDER — NICOTINE 14 MG/24 HR DAILY PATCH
1424 mg/24 hr | Freq: Every day | TRANSDERMAL | Status: DC
Start: 2020-11-17 — End: 2020-11-24

## 2020-11-17 MED ORDER — L.ACIDOPH & PARACASEI-S.THERMOPHIL-BIFIDOBACTERIUM 16 BILLION CELL CAP
16 billion cell | Freq: Every day | ORAL | Status: DC
Start: 2020-11-17 — End: 2020-11-18

## 2020-11-17 MED ORDER — SODIUM CHLORIDE 0.9 % IV PIGGY BACK
1 gram | INTRAVENOUS | Status: AC
Start: 2020-11-17 — End: 2020-11-17
  Administered 2020-11-17: 19:00:00 via INTRAVENOUS

## 2020-11-17 MED ORDER — SENNOSIDES-DOCUSATE SODIUM 8.6 MG-50 MG TAB
Freq: Every day | ORAL | Status: AC | PRN
Start: 2020-11-17 — End: 2020-11-24

## 2020-11-17 MED ORDER — SODIUM CHLORIDE 0.9% BOLUS IV
0.9 % | Freq: Once | INTRAVENOUS | Status: AC
Start: 2020-11-17 — End: 2020-11-17
  Administered 2020-11-17: 15:00:00 via INTRAVENOUS

## 2020-11-17 MED ORDER — SODIUM CHLORIDE 0.9 % IJ SYRG
INTRAMUSCULAR | Status: AC | PRN
Start: 2020-11-17 — End: 2020-11-24

## 2020-11-17 MED ORDER — ACETAMINOPHEN 325 MG TABLET
325 mg | Freq: Four times a day (QID) | ORAL | Status: DC | PRN
Start: 2020-11-17 — End: 2020-11-24
  Administered 2020-11-17 – 2020-11-23 (×10): via ORAL

## 2020-11-17 MED ORDER — SODIUM CHLORIDE 0.9 % IV PIGGY BACK
1 gram | INTRAVENOUS | Status: AC
Start: 2020-11-17 — End: 2020-11-28
  Administered 2020-11-18 – 2020-11-23 (×6): via INTRAVENOUS

## 2020-11-17 MED ORDER — ALBUTEROL SULFATE HFA 90 MCG/ACTUATION AEROSOL INHALER
90 mcg/actuation | Freq: Once | RESPIRATORY_TRACT | Status: AC
Start: 2020-11-17 — End: 2020-11-17
  Administered 2020-11-17: 15:00:00 via RESPIRATORY_TRACT

## 2020-11-17 MED ORDER — POTASSIUM CHLORIDE SR 20 MEQ TAB, PARTICLES/CRYSTALS
20 mEq | Freq: Two times a day (BID) | ORAL | Status: AC
Start: 2020-11-17 — End: 2020-11-24
  Administered 2020-11-17 – 2020-11-22 (×10): via ORAL

## 2020-11-17 MED ORDER — KETOROLAC TROMETHAMINE 30 MG/ML INJECTION
30 mg/mL (1 mL) | INTRAMUSCULAR | Status: AC
Start: 2020-11-17 — End: 2020-11-17
  Administered 2020-11-17: 15:00:00 via INTRAVENOUS

## 2020-11-17 MED ORDER — ENOXAPARIN 40 MG/0.4 ML SUB-Q SYRINGE
40 mg/0.4 mL | Freq: Every day | SUBCUTANEOUS | Status: AC
Start: 2020-11-17 — End: 2020-11-24
  Administered 2020-11-18 – 2020-11-24 (×7): via SUBCUTANEOUS

## 2020-11-17 MED ORDER — DIPHENHYDRAMINE HCL 50 MG/ML IJ SOLN
50 mg/mL | INTRAMUSCULAR | Status: AC
Start: 2020-11-17 — End: 2020-11-17
  Administered 2020-11-17: 15:00:00 via INTRAVENOUS

## 2020-11-17 MED FILL — OXYCODONE 5 MG TAB: 5 mg | ORAL | Qty: 1

## 2020-11-17 MED FILL — MORPHINE 2 MG/ML INJECTION: 2 mg/mL | INTRAMUSCULAR | Qty: 1

## 2020-11-17 MED FILL — KETOROLAC TROMETHAMINE 30 MG/ML INJECTION: 30 mg/mL (1 mL) | INTRAMUSCULAR | Qty: 1

## 2020-11-17 MED FILL — DIPHENHYDRAMINE HCL 50 MG/ML IJ SOLN: 50 mg/mL | INTRAMUSCULAR | Qty: 1

## 2020-11-17 MED FILL — BD POSIFLUSH NORMAL SALINE 0.9 % INJECTION SYRINGE: INTRAMUSCULAR | Qty: 40

## 2020-11-17 MED FILL — ONDANSETRON (PF) 4 MG/2 ML INJECTION: 4 mg/2 mL | INTRAMUSCULAR | Qty: 2

## 2020-11-17 MED FILL — RISAQUAD-2  16 BILLION CELL CAPSULE: 16 billion cell | ORAL | Qty: 1

## 2020-11-17 MED FILL — MAPAP (ACETAMINOPHEN) 325 MG TABLET: 325 mg | ORAL | Qty: 2

## 2020-11-17 MED FILL — ALBUTEROL SULFATE HFA 90 MCG/ACTUATION AEROSOL INHALER: 90 mcg/actuation | RESPIRATORY_TRACT | Qty: 8.5

## 2020-11-17 MED FILL — POTASSIUM CHLORIDE SR 20 MEQ TAB, PARTICLES/CRYSTALS: 20 mEq | ORAL | Qty: 1

## 2020-11-17 MED FILL — DEXTROMETHORPHAN-GUAIFENESIN 10 MG-100 MG/5 ML SYRUP: 100-10 mg/5 mL | ORAL | Qty: 10

## 2020-11-17 MED FILL — AZITHROMYCIN 250 MG TAB: 250 mg | ORAL | Qty: 2

## 2020-11-17 MED FILL — AMPHETAMINE-DEXTROAMPHETAMINE 10 MG TAB: 10 mg | ORAL | Qty: 4

## 2020-11-17 MED FILL — MAPAP (ACETAMINOPHEN) 325 MG TABLET: 325 mg | ORAL | Qty: 3

## 2020-11-17 MED FILL — ALPRAZOLAM 0.25 MG TAB: 0.25 mg | ORAL | Qty: 1

## 2020-11-17 MED FILL — SODIUM CHLORIDE 0.9 % IV: INTRAVENOUS | Qty: 1000

## 2020-11-17 MED FILL — CEFTRIAXONE 1 GRAM SOLUTION FOR INJECTION: 1 gram | INTRAMUSCULAR | Qty: 1

## 2020-11-17 NOTE — Progress Notes (Signed)
[employer] sent to [facility] regarding 3 hour Sepsis bundle.      Thank you,     Cat Reed, BSN, Charity fundraiser, AMB-BC  Sepsis [employer]  Callback # [phone]

## 2020-11-17 NOTE — ED Notes (Signed)
PT to CT

## 2020-11-17 NOTE — ED Provider Notes (Signed)
ED Provider Notes by Berle Mull, MD at 11/17/20 1007                Author: Berle Mull, MD  Service: --  Author Type: Physician       Filed: 11/17/20 1232  Date of Service: 11/17/20 1007  Status: Signed          Editor: Berle Mull, MD (Physician)               EMERGENCY DEPARTMENT HISTORY AND PHYSICAL EXAM         Date: 11/17/2020   Patient Name: Christina Stevens        History of Presenting Illness          Chief Complaint       Patient presents with        ?  Chest Pain     ?  Shortness of Breath     ?  Anxiety        ?  Fever           History Provided By: Patient      Christina Stevens, 40 y.o. female       Is a 40 year old female with history of high cholesterol, ADD presenting with concerns of chest pain, shortness of breath, URI symptoms, fever.  Patient states that symptoms began 3 to 4  days ago with URI symptoms with rhinorrhea, cough, states that she then began having fever T-max 102 at home, has been taking Tylenol without significant relief as well as Mucinex.  Patient states that she then began having full body myalgias, sore throat,  ear pain and noticed a fluid collection coming from her left ear yesterday.  Patient endorses headache, myalgias, cough without sputum production, fever, denies any sick contacts.  Patient states that she has not been able to get sleep for many days as  well as not been able to eat due to nausea, vomiting or diarrhea.  Patient endorses abdominal pain as well, denies dysuria.  States that she received her vaccines for COVID this year, no other complaints today.          There are no other complaints, changes, or physical findings at this time.      PCP: Sherley Bounds, NP        No current facility-administered medications on file prior to encounter.          Current Outpatient Medications on File Prior to Encounter          Medication  Sig  Dispense  Refill           ?  HYDROmorphone (DILAUDID) 2 mg tablet  Take 1 Tab by mouth every six (6) hours as  needed. Max Daily Amount: 8 mg.  14 Tab  0     ?  nystatin (MYCOSTATIN) 100,000 unit/mL suspension  Take 5 mL by mouth four (4) times daily. swish and spit  60 mL  0     ?  senna-docusate (PERICOLACE) 8.6-50 mg per tablet  Take 1-2 Tabs by mouth daily as needed for Constipation.  20 Tab  0     ?  L.acidoph & parac-S.therm-Bifido (FLORA Q2/RISAQUAD-2) 16 billion cell cap cap  Take 1 Cap by mouth daily.  30 Cap  0     ?  ondansetron hcl (ZOFRAN) 4 mg tablet  Take 1 Tab by mouth every eight (8) hours as needed for Nausea.  14 Tab  0           ?  dextroamphetamine-amphetamine (ADDERALL) 20 mg tablet  Take 40 mg by mouth daily.                 Past History        Past Medical History:     Past Medical History:        Diagnosis  Date         ?  ADHD       ?  [facility] Patient infection       ?  MRSA (methicillin resistant staph aureus) culture positive       ?  MS (congenital mitral stenosis)           ?  PCOS (polycystic ovarian syndrome)             Past Surgical History:     Past Surgical History:         Procedure  Laterality  Date          ?  HX APPENDECTOMY              ?  IR CHOLECYSTOSTOMY PERCUTANEOUS               Family History:   History reviewed. No pertinent family history.      Social History:     Social History          Tobacco Use         ?  Smoking status:  Every Day     ?  Smokeless tobacco:  Never       Substance Use Topics         ?  Alcohol use:  No     ?  Drug use:  Yes              Types:  Marijuana           Allergies:     Allergies        Allergen  Reactions         ?  Percocet [Oxycodone-Acetaminophen]  Itching                Review of Systems     Review of Systems    Constitutional:  Positive for activity change, appetite change , chills, fatigue and  fever.    HENT:  Positive for ear pain, rhinorrhea  and sore throat. Negative for congestion.     Eyes:  Negative for pain and visual disturbance.    Respiratory:  Positive for cough, chest tightness  and shortness of breath.     Cardiovascular:   Positive for chest pain. Negative for leg swelling.    Gastrointestinal:  Positive for abdominal pain (LUQ),  diarrhea, nausea and vomiting . Negative for constipation.    Genitourinary:  Negative for decreased urine volume, difficulty urinating and dysuria.    Musculoskeletal:  Positive for arthralgias and myalgias .    Skin:  Negative for rash.    Neurological:  Positive for dizziness, light-headedness  and headaches. Negative for weakness.    Hematological:  Does not bruise/bleed easily.    Psychiatric/Behavioral:  Negative for confusion.          Physical Exam     Physical Exam   Vitals reviewed.    Constitutional:        General: She is in acute distress.       Appearance: She is not ill-appearing.    HENT:       Head: Normocephalic and atraumatic.  Right Ear: Tympanic membrane normal.       Left Ear: Tenderness present. A middle ear effusion  is present. Tympanic membrane is erythematous. Tympanic membrane is not bulging.       Mouth/Throat:       Mouth: Mucous membranes are moist.    Eyes:       Conjunctiva/sclera: Conjunctivae normal.     Cardiovascular:       Rate and Rhythm: Normal rate.       Pulses: Normal pulses.    Pulmonary:       Effort: Pulmonary effort is normal. No tachypnea or respiratory distress.       Breath sounds: No decreased breath sounds, wheezing, rhonchi or rales.     Abdominal:       General: Abdomen is flat. There is abdominal bruit (Left upper quadrant).       Palpations: Abdomen is soft.       Tenderness: There is no abdominal tenderness. There is no guarding or rebound.     Musculoskeletal:          General: No deformity.       Cervical back: Normal range of motion and neck supple.    Skin:      General: Skin is warm and dry.    Neurological:       General: No focal deficit present.       Mental Status: She is alert and oriented to person, place, and time.    Psychiatric:          Mood and Affect: Mood is anxious.          Behavior: Behavior normal.            Diagnostic Study  Results        Labs -         Recent Results (from the past 24 hour(s))     EKG, 12 LEAD, INITIAL          Collection Time: 11/17/20  9:00 AM         Result  Value  Ref Range            Ventricular Rate  98  BPM       Atrial Rate  98  BPM       P-R Interval  136  ms       QRS Duration  88  ms       Q-T Interval  344  ms       QTC Calculation (Bezet)  439  ms       Calculated P Axis  72  degrees       Calculated R Axis  78  degrees       Calculated T Axis  66  degrees       Diagnosis                 Normal sinus rhythm   Normal ECG   When compared with ECG of 17-Oct-2017 21:31,   No significant change was found   Confirmed by Dennie Bible [phone]) on 11/17/2020 11:41:53 AM          CBC WITH AUTOMATED DIFF          Collection Time: 11/17/20 10:07 AM         Result  Value  Ref Range            WBC  10.4  3.6 - 11.0 K/uL  RBC  4.43  3.80 - 5.20 M/uL       HGB  14.7  11.5 - 16.0 g/dL       HCT  42.1  35.0 - 47.0 %       MCV  95.0  80.0 - 99.0 FL       MCH  33.2  26.0 - 34.0 PG       MCHC  34.9  30.0 - 36.5 g/dL       RDW  12.8  11.5 - 14.5 %       PLATELET  234  150 - 400 K/uL       MPV  10.6  8.9 - 12.9 FL       NRBC  0.0  0 PER 100 WBC       ABSOLUTE NRBC  0.00  0.00 - 0.01 K/uL       NEUTROPHILS  81 (H)  32 - 75 %       LYMPHOCYTES  10 (L)  12 - 49 %       MONOCYTES  8  5 - 13 %       EOSINOPHILS  0  0 - 7 %       BASOPHILS  0  0 - 1 %       IMMATURE GRANULOCYTES  1 (H)  0.0 - 0.5 %       ABS. NEUTROPHILS  8.4 (H)  1.8 - 8.0 K/UL       ABS. LYMPHOCYTES  1.0  0.8 - 3.5 K/UL       ABS. MONOCYTES  0.9  0.0 - 1.0 K/UL       ABS. EOSINOPHILS  0.0  0.0 - 0.4 K/UL       ABS. BASOPHILS  0.0  0.0 - 0.1 K/UL       ABS. IMM. GRANS.  0.1 (H)  0.00 - 0.04 K/UL       DF  AUTOMATED          NT-PRO BNP          Collection Time: 11/17/20 10:07 AM         Result  Value  Ref Range            NT pro-BNP  191 (H)  <125 PG/ML       TROPONIN-HIGH SENSITIVITY          Collection Time: 11/17/20 10:07 AM         Result  Value  Ref Range             Troponin-High Sensitivity  5  0 - 51 ng/L       METABOLIC PANEL, COMPREHENSIVE          Collection Time: 11/17/20 10:07 AM         Result  Value  Ref Range            Sodium  136  136 - 145 mmol/L       Potassium  3.4 (L)  3.5 - 5.1 mmol/L       Chloride  105  97 - 108 mmol/L       CO2  25  21 - 32 mmol/L       Anion gap  6  5 - 15 mmol/L       Glucose  116 (H)  65 - 100 mg/dL       BUN  7  6 - 20 MG/DL  Creatinine  0.94  0.55 - 1.02 MG/DL       BUN/Creatinine ratio  7 (L)  12 - 20         eGFR  >60  >60 ml/min/1.52m       Calcium  8.2 (L)  8.5 - 10.1 MG/DL       Bilirubin, total  0.4  0.2 - 1.0 MG/DL       ALT (SGPT)  35  12 - 78 U/L       AST (SGOT)  25  15 - 37 U/L       Alk. phosphatase  77  45 - 117 U/L       Protein, total  6.9  6.4 - 8.2 g/dL       Albumin  3.5  3.5 - 5.0 g/dL       Globulin  3.4  2.0 - 4.0 g/dL       A-G Ratio  1.0 (L)  1.1 - 2.2         SARS-COV-2          Collection Time: 11/17/20 11:26 AM         Result  Value  Ref Range            SARS-CoV-2 by PCR  Nasopharyngeal          INFLUENZA A+B VIRAL AGS          Collection Time: 11/17/20 11:26 AM         Result  Value  Ref Range            Influenza A Antigen  Negative  NEG         Influenza B Antigen  Negative  NEG         COVID-19 RAPID TEST          Collection Time: 11/17/20 11:26 AM         Result  Value  Ref Range            Specimen source  Nasopharyngeal               COVID-19 rapid test  Not detected  NOTD             Radiologic Studies -      XR CHEST PORT       Final Result          Bilateral upper lobe infiltrates                      CT Results  (Last 48 hours)             None                    CXR Results  (Last 48 hours)                                       11/17/20 0952    XR CHEST PORT  Final result            Impression:           Bilateral upper lobe infiltrates                              Narrative:    EXAM:  XR CHEST PORT  INDICATION: Chest pain             COMPARISON: 06/15/2017              TECHNIQUE: Upright portable chest AP view at 0941 hours             FINDINGS: The cardiomediastinal and hilar contours are within normal limits. The      pulmonary vasculature is within normal limits.              The lungs and pleural spaces are remarkable for bibasilar infiltrates, slightly      R. Jensen the right upper lobe. The visualized bones and upper abdomen are      age-appropriate.                                            Medical Decision Making     I am the first provider for this patient.      I reviewed the vital signs, available nursing notes, past medical history, past surgical history, family history and social history.      Vital Signs-Reviewed the patient's vital signs.   Patient Vitals for the past 12 hrs:            Temp  Pulse  Resp  BP  SpO2            11/17/20 1202  --  99  22  119/83  93 %            11/17/20 1147  --  95  23  136/71  91 %     11/17/20 0917  --  89  18  139/88  --            11/17/20 0847  (!) 101.3 ??F (38.5 ??C)  88  17  130/72  93 %           Records Reviewed: Nursing records and medical records reviewed      Ddx: COVID-19, influenza, viral etiology, OM      Initial assessment performed. The patients presenting problems have been discussed, and they are in agreement with the care plan formulated and outlined with them.  I have encouraged them to ask questions as they arise throughout their visit.      MDM   Number of Diagnoses or Management Options   Diagnosis management comments: Is a 40 year old female presenting with multiple complaints today.  Patient states that she has had URI symptoms for 3 to 4 days, began having fevers, now having myalgias,  shortness of breath, chest pain, nausea, vomiting or diarrhea.  Patient appears very uncomfortable on arrival, febrile, states that she was taken Tylenol without relief.  Patient also taking Mucinex and breathing treatments without significant relief.   Overall patient vitals are stable on arrival, satting above 95%, febrile  on arrival, nontachycardic, normotensive, lower suspicion for acute bacterial infection, high suspicion for viral etiology with constellation of symptoms, we will plan on broad  work-up including metabolic and infection including troponin due to chest pain, chest x-ray to rule out pneumonia, urine, symptomatic management of headache and nausea with Compazine, Benadryl and Toradol, will plan on lab work, imaging and medications  and reevaluation.  We will send COVID and flu if patient feeling better for outpatient results.  Higher suspicion for viral etiology of left ear pain although given erythema and opaqueness  of left TM we will plan on antibiotics if discharged.              ED Course as of 11/17/20 [place] Nov 17, 2020        1223  On reevaluation patient is diaphoretic, tachypneic, satting 94%, suspicious she would not pass ambulation test, chest x-ray with bilateral apical pneumonia,  concern of possible viral versus overlying bacterial cause, will plan on coverage with antibiotics and admission [RN]              ED Course User Index   [RN] Berle Mull, MD                 Procedures      Critical Care: Does not meet criteria      Disposition: Admit                 Diagnosis        Clinical Impression:       1.  Community acquired pneumonia, unspecified laterality            Attestations:      Berle Mull, MD      Please note that this dictation was completed with Dragon, the computer voice recognition software.  Quite often unanticipated grammatical, syntax, homophones, and other interpretive errors are inadvertently  transcribed by the computer software.  Please disregard these errors.  Please excuse any errors that have escaped final proofreading.  Thank you.

## 2020-11-17 NOTE — Progress Notes (Signed)
Attempted to contact primary RN regarding this patient. Left message with Charge RN Bjorn Loser regarding 6 hour Sepsis bundle.

## 2020-11-17 NOTE — Progress Notes (Signed)
Attempted to contact primary RN in regards to the sepsis bundle.      Thank you,     Cat Reed, BSN, RN, AMB-BC  [facility]  Callback # [phone]

## 2020-11-17 NOTE — ED Notes (Signed)
Pt reports SOB and increased work of breathing. 96% on RA, 2L nasal cannula applied for comfort.

## 2020-11-17 NOTE — ED Notes (Signed)
Bedside and Verbal shift change report given to Morrie Sheldon, Charity fundraiser (oncoming nurse) by Alan Ripper, RN (offgoing nurse). Report included the following information SBAR, Kardex, ED Summary, MAR, and Recent Results.

## 2020-11-17 NOTE — H&P (Signed)
H&P by Girard Cooter, MD at 11/17/20 1251                Author: Girard Cooter, MD  Service: Internal Medicine  Author Type: Physician       Filed: 11/17/20 1927  Date of Service: 11/17/20 1251  Status: Addendum          Editor: Girard Cooter, MD (Physician)          Related Notes: Original Note by Girard Cooter, MD (Physician) filed at 11/17/20 [place]                                  Hospitalist Admission Note      NAME: Christina Stevens    DOB:  [birth year]    MRN:  [id number]       Date/Time:  11/17/2020 12:51 PM      Patient PCP: Christina Bounds, NP   ______________________________________________________________________   Given the patient's current clinical presentation, I have a high level of concern for decompensation if discharged from the emergency department.  Complex decision making was performed, which includes  reviewing the patient's available past medical records, laboratory results, and x-ray films.         My assessment of this patient's clinical condition and my plan of care is as follows.      Assessment / Plan:     Community-acquired pneumonia    Maintaining oxygen saturation, however is tachypneic   Chest x-ray showed bilateral upper lobe infiltrates  Continue ceftriaxone and azithromycin  Sputum culture, blood culture  Urine  strep and Legionella. Rapid covid/ flu negative, Covid PCR pending   Robitussin as needed      Generalized weakness:   Has weakness greater on lower > upper extremity, could be d/t pneumonia/ infection   Will get a lumbar/Thoracic spine CT scan   Pain control with morphine      ADHD  Continue with Adderal    Hypercholesterolemia   Cw statin      PCOS:  Takes medical marijuana at home      Active smoker:   Nicotine patch     Code Status: Full   Surrogate Decision Maker: Her spouse Christina Stevens   Discussed with her spouse Christina Stevens at bedside      DVT Prophylaxis: Lovenox   GI Prophylaxis: not indicated      Baseline: Independent from home           Subjective:      CHIEF COMPLAINT: Shortness of breath      HISTORY OF PRESENT ILLNESS:      Christina Stevens is a 40 y.o. female with past medical history of ADHD, hypercholesterolemia, PCOS on medical marijuana presented with cough and shortness of breath for last 5 days.  She started having flulike symptoms 5 days back, started with cough and  congestion.  She has some phlegm with cough.  She also has some chest pain associated with cough and breathing.  She has worsening shortness of breath for last few days.  She has generalized weakness and her legs feel like Jell-O.  She also had some urinary  incontinence for last 2 days.  She has been having multiple loose bowel movements.  She denies any belly pain. She also has some lower and mid back pain. She couldn't get up today and almost collapsed down to the floor d/t her generalized weakness. She  also had  some ear pain + notice some fluids draining from left ear.  On my evaluation she is in mild distress, has bouts of cough.  She is on room air maintaining oxygen saturation.      We were asked to admit for work up and evaluation of the above problems.         Past Medical History:        Diagnosis  Date         ?  ADHD       ?  [facility] Patient infection       ?  MRSA (methicillin resistant staph aureus) culture positive       ?  MS (congenital mitral stenosis)           ?  PCOS (polycystic ovarian syndrome)                Past Surgical History:         Procedure  Laterality  Date          ?  HX APPENDECTOMY              ?  IR CHOLECYSTOSTOMY PERCUTANEOUS                 Social History          Tobacco Use         ?  Smoking status:  Every Day     ?  Smokeless tobacco:  Never       Substance Use Topics         ?  Alcohol use:  No            History reviewed. No pertinent family history.     Allergies        Allergen  Reactions         ?  Percocet [Oxycodone-Acetaminophen]  Itching              Prior to Admission medications             Medication  Sig  Start Date  End Date  Taking?   Authorizing Provider            atorvastatin (LIPITOR) 20 mg tablet  Take 20 mg by mouth daily.      Yes  Provider, Historical     HYDROmorphone (DILAUDID) 2 mg tablet  Take 1 Tab by mouth every six (6) hours as needed. Max Daily Amount: 8 mg.   Patient not taking: Reported on 11/17/2020  02/20/17      Debeb, Wondaya T, MD     nystatin (MYCOSTATIN) 100,000 unit/mL suspension  Take 5 mL by mouth four (4) times daily. swish and spit  02/20/17      Debeb, Wondaya T, MD     senna-docusate (PERICOLACE) 8.6-50 mg per tablet  Take 1-2 Tabs by mouth daily as needed for Constipation.  02/20/17      Debeb, Laray.Dama T, MD     L.acidoph & parac-S.therm-Bifido (FLORA Q2/RISAQUAD-2) 16 billion cell cap cap  Take 1 Cap by mouth daily.   Patient not taking: Reported on 11/17/2020  02/20/17      Debeb, Wondaya T, MD     ondansetron hcl (ZOFRAN) 4 mg tablet  Take 1 Tab by mouth every eight (8) hours as needed for Nausea.  02/20/17      Debeb, LKGMWNU T, MD            dextroamphetamine-amphetamine (ADDERALL) 20 mg tablet  Take 40 mg by mouth daily.        Provider, Historical           REVIEW OF SYSTEMS:         Total of 12 systems reviewed as follows:        POSITIVE= underlined text  Negative = text not underlined   General:  fever, chills, sweats, generalized weakness,  weight loss/gain,       loss of appetite    Eyes:    blurred vision, eye pain, loss of vision, double vision   ENT:    rhinorrhea, pharyngitis    Respiratory:   cough, sputum production, SOB , DOE, wheezing, pleuritic pain    Cardiology:   chest pain, palpitations, orthopnea, PND, edema, syncope    Gastrointestinal:  abdominal pain , N/V, diarrhea,  dysphagia, constipation, bleeding    Genitourinary:  frequency, urgency, dysuria, hematuria, incontinence    Muskuloskeletal :  arthralgia, myalgia, back pain   Hematology:  easy bruising, nose or gum bleeding, lymphadenopathy    Dermatological: rash, ulceration, pruritis, color change / jaundice   Endocrine:   hot flashes or  polydipsia    Neurological:  headache, dizziness, confusion, focal weakness, paresthesia,      Speech difficulties, memory loss, gait difficulty   Psychological: Feelings of anxiety, depression, agitation        Objective:     VITALS:     Visit Vitals      BP  114/79     Pulse  82     Temp  (!) 101.3 ??F (38.5 ??C)     Resp  23     Ht  5' 1" (1.549 m)     Wt  76.2 kg (168 lb)     SpO2  93%        BMI  31.74 kg/m??           PHYSICAL EXAM:      General:    Mild distress, awake   HEENT: Atraumatic, anicteric sclerae, pink conjunctivae      No oral ulcers, mucosa moist, throat clear, dentition fair   Neck:  Supple, symmetrical,  thyroid: non tender   Lungs:   Decreased air entry b/l, difficult ausculatation d/t bouts of cough   Chest wall:  No tenderness  No Accessory muscle use.   Heart:   Regular  rhythm,  No  murmur   No edema   Abdomen:   Soft, non-tender. Not distended.  Bowel sounds normal   Extremities: No cyanosis.  No clubbing,       Skin turgor normal, Capillary refill normal, Radial dial pulse 2+   Skin:     Not pale.  Not Jaundiced  No rashes    Psych:  Good insight.  Not depressed.  Not anxious or agitated.   Neurologic: AO x 3, power 2/5 in b/l Lower ext, 4/5 in upper ext      _______________________________________________________________________   Care Plan discussed with:           Comments         Patient  y           Family   y  Wife at bedside         RN  y       Environmental consultant:  _______________________________________________________________________   Expected  Disposition:       Home with Family          HH/PT/OT/RN  y     SNF/LTC          [facility]       ________________________________________________________________________   TOTAL TIME:  1 [place] Provided     Minutes non procedure based              Comments             Reviewed previous records         >50% of visit spent in counseling and coordination of care    Discussion with patient  and/or family and questions answered           ________________________________________________________________________   Signed: Girard Cooter, MD      Procedures: see electronic medical records for all procedures/Xrays and details which were not copied into this note but were reviewed prior to creation of Plan.      LAB DATA REVIEWED:       Recent Results (from the past 24 hour(s))     EKG, 12 LEAD, INITIAL          Collection Time: 11/17/20  9:00 AM         Result  Value  Ref Range            Ventricular Rate  98  BPM       Atrial Rate  98  BPM       P-R Interval  136  ms       QRS Duration  88  ms       Q-T Interval  344  ms       QTC Calculation (Bezet)  439  ms       Calculated P Axis  72  degrees       Calculated R Axis  78  degrees       Calculated T Axis  66  degrees       Diagnosis                 Normal sinus rhythm   Normal ECG   When compared with ECG of 17-Oct-2017 21:31,   No significant change was found   Confirmed by Dennie Bible [phone]) on 11/17/2020 11:41:53 AM          CBC WITH AUTOMATED DIFF          Collection Time: 11/17/20 10:07 AM         Result  Value  Ref Range            WBC  10.4  3.6 - 11.0 K/uL       RBC  4.43  3.80 - 5.20 M/uL       HGB  14.7  11.5 - 16.0 g/dL       HCT  42.1  35.0 - 47.0 %       MCV  95.0  80.0 - 99.0 FL       MCH  33.2  26.0 - 34.0 PG       MCHC  34.9  30.0 - 36.5 g/dL       RDW  12.8  11.5 - 14.5 %       PLATELET  234  150 - 400 K/uL       MPV  10.6  8.9 - 12.9 FL  NRBC  0.0  0 PER 100 WBC       ABSOLUTE NRBC  0.00  0.00 - 0.01 K/uL       NEUTROPHILS  81 (H)  32 - 75 %       LYMPHOCYTES  10 (L)  12 - 49 %       MONOCYTES  8  5 - 13 %       EOSINOPHILS  0  0 - 7 %       BASOPHILS  0  0 - 1 %       IMMATURE GRANULOCYTES  1 (H)  0.0 - 0.5 %       ABS. NEUTROPHILS  8.4 (H)  1.8 - 8.0 K/UL       ABS. LYMPHOCYTES  1.0  0.8 - 3.5 K/UL       ABS. MONOCYTES  0.9  0.0 - 1.0 K/UL       ABS. EOSINOPHILS  0.0  0.0 - 0.4 K/UL       ABS. BASOPHILS  0.0  0.0 - 0.1 K/UL        ABS. IMM. GRANS.  0.1 (H)  0.00 - 0.04 K/UL       DF  AUTOMATED          NT-PRO BNP          Collection Time: 11/17/20 10:07 AM         Result  Value  Ref Range            NT pro-BNP  191 (H)  <125 PG/ML       TROPONIN-HIGH SENSITIVITY          Collection Time: 11/17/20 10:07 AM         Result  Value  Ref Range            Troponin-High Sensitivity  5  0 - 51 ng/L       METABOLIC PANEL, COMPREHENSIVE          Collection Time: 11/17/20 10:07 AM         Result  Value  Ref Range            Sodium  136  136 - 145 mmol/L       Potassium  3.4 (L)  3.5 - 5.1 mmol/L       Chloride  105  97 - 108 mmol/L       CO2  25  21 - 32 mmol/L       Anion gap  6  5 - 15 mmol/L       Glucose  116 (H)  65 - 100 mg/dL       BUN  7  6 - 20 MG/DL       Creatinine  0.94  0.55 - 1.02 MG/DL       BUN/Creatinine ratio  7 (L)  12 - 20         eGFR  >60  >60 ml/min/1.39m       Calcium  8.2 (L)  8.5 - 10.1 MG/DL       Bilirubin, total  0.4  0.2 - 1.0 MG/DL       ALT (SGPT)  35  12 - 78 U/L       AST (SGOT)  25  15 - 37 U/L       Alk. phosphatase  77  45 - 117 U/L       Protein, total  6.9  6.4 - 8.2 g/dL       Albumin  3.5  3.5 - 5.0 g/dL       Globulin  3.4  2.0 - 4.0 g/dL       A-G Ratio  1.0 (L)  1.1 - 2.2         SARS-COV-2          Collection Time: 11/17/20 11:26 AM         Result  Value  Ref Range            SARS-CoV-2 by PCR  Nasopharyngeal          INFLUENZA A+B VIRAL AGS          Collection Time: 11/17/20 11:26 AM         Result  Value  Ref Range            Influenza A Antigen  Negative  NEG         Influenza B Antigen  Negative  NEG         COVID-19 RAPID TEST          Collection Time: 11/17/20 11:26 AM         Result  Value  Ref Range            Specimen source  Nasopharyngeal               COVID-19 rapid test  Not detected  NOTD

## 2020-11-17 NOTE — ED Notes (Signed)
Pt presents to ER via EMS reporting SOB, CP, fever x3 days. She is very anxious and yelling about leaving AMA while being moved off EMS stretcher. Assisted to position of comfort, call bell within reach.

## 2020-11-17 NOTE — ED Notes (Signed)
Pt removed all monitoring devices. RN educated on importance of continuous monitoring.

## 2020-11-17 NOTE — ED Notes (Signed)
Pt removed her oxygen

## 2020-11-18 LAB — BASIC METABOLIC PANEL
Anion Gap: 7 mmol/L (ref 5–15)
BUN: 4 MG/DL — ABNORMAL LOW (ref 6–20)
Bun/Cre Ratio: 5 — ABNORMAL LOW (ref 12–20)
CO2: 27 mmol/L (ref 21–32)
Calcium: 8.3 MG/DL — ABNORMAL LOW (ref 8.5–10.1)
Chloride: 106 mmol/L (ref 97–108)
Creatinine: 0.83 MG/DL (ref 0.55–1.02)
ESTIMATED GLOMERULAR FILTRATION RATE: >60 mL/min/{1.73_m2} (ref >60)
Glucose: 120 mg/dL — ABNORMAL HIGH (ref 65–100)
Potassium: 3.9 mmol/L (ref 3.5–5.1)
Sodium: 140 mmol/L (ref 136–145)

## 2020-11-18 LAB — CBC
Hematocrit: 42.4 % (ref 35.0–47.0)
Hemoglobin: 14.3 g/dL (ref 11.5–16.0)
MCH: 32.7 PG (ref 26.0–34.0)
MCHC: 33.7 g/dL (ref 30.0–36.5)
MCV: 97 FL (ref 80.0–99.0)
MPV: 10.2 FL (ref 8.9–12.9)
NRBC Absolute: 0 10*3/uL (ref 0.00–0.01)
Nucleated RBCs: 0 PER 100 WBC
Platelets: 201 10*3/uL (ref 150–400)
RBC: 4.37 M/uL (ref 3.80–5.20)
RDW: 13 % (ref 11.5–14.5)
WBC: 9.3 10*3/uL (ref 3.6–11.0)

## 2020-11-18 LAB — PROCALCITONIN
Procalcitonin: <0.05 ng/mL
Procalcitonin: <0.05 ng/mL

## 2020-11-18 LAB — METABOLIC PANEL, BASIC
Anion gap: 7 mmol/L (ref 5–15)
BUN/Creatinine ratio: 5 — ABNORMAL LOW (ref 12–20)
BUN: 4 MG/DL — ABNORMAL LOW (ref 6–20)
CO2: 27 mmol/L (ref 21–32)
Calcium: 8.3 MG/DL — ABNORMAL LOW (ref 8.5–10.1)
Chloride: 106 mmol/L (ref 97–108)
Creatinine: 0.83 MG/DL (ref 0.55–1.02)
Glucose: 120 mg/dL — ABNORMAL HIGH (ref 65–100)
Potassium: 3.9 mmol/L (ref 3.5–5.1)
Sodium: 140 mmol/L (ref 136–145)
eGFR: >60 mL/min/{1.73_m2} (ref >60)

## 2020-11-18 LAB — CBC W/O DIFF
ABSOLUTE NRBC: 0 10*3/uL (ref 0.00–0.01)
HCT: 42.4 % (ref 35.0–47.0)
HGB: 14.3 g/dL (ref 11.5–16.0)
MCH: 32.7 PG (ref 26.0–34.0)
MCHC: 33.7 g/dL (ref 30.0–36.5)
MCV: 97 FL (ref 80.0–99.0)
MPV: 10.2 FL (ref 8.9–12.9)
NRBC: 0 PER 100 WBC
PLATELET: 201 10*3/uL (ref 150–400)
RBC: 4.37 M/uL (ref 3.80–5.20)
RDW: 13 % (ref 11.5–14.5)
WBC: 9.3 10*3/uL (ref 3.6–11.0)

## 2020-11-18 MED ORDER — L.ACIDOPHILUS-L.PARACASEI-B.BIFIDUM-S.THERMOPHL 8 BILLION CELL CAPSULE
8 billion cell | Freq: Every day | ORAL | Status: AC
Start: 2020-11-18 — End: 2020-11-24
  Administered 2020-11-18 – 2020-11-24 (×7): via ORAL

## 2020-11-18 MED FILL — MORPHINE 2 MG/ML INJECTION: 2 mg/mL | INTRAMUSCULAR | Qty: 1

## 2020-11-18 MED FILL — MAPAP (ACETAMINOPHEN) 325 MG TABLET: 325 mg | ORAL | Qty: 2

## 2020-11-18 MED FILL — AMPHETAMINE-DEXTROAMPHETAMINE 10 MG TAB: 10 mg | ORAL | Qty: 4

## 2020-11-18 MED FILL — ENOXAPARIN 40 MG/0.4 ML SUB-Q SYRINGE: 40 mg/0.4 mL | SUBCUTANEOUS | Qty: 0.4

## 2020-11-18 MED FILL — ACETAMINOPHEN 650 MG RECTAL SUPPOSITORY: 650 mg | RECTAL | Qty: 1

## 2020-11-18 MED FILL — POTASSIUM CHLORIDE SR 20 MEQ TAB, PARTICLES/CRYSTALS: 20 mEq | ORAL | Qty: 1

## 2020-11-18 MED FILL — DEXTROMETHORPHAN-GUAIFENESIN 10 MG-100 MG/5 ML SYRUP: 100-10 mg/5 mL | ORAL | Qty: 10

## 2020-11-18 MED FILL — NICOTINE 14 MG/24 HR DAILY PATCH: 14 mg/24 hr | TRANSDERMAL | Qty: 1

## 2020-11-18 MED FILL — CEFTRIAXONE 1 GRAM SOLUTION FOR INJECTION: 1 gram | INTRAMUSCULAR | Qty: 1

## 2020-11-18 MED FILL — RISAQUAD 8 BILLION CELL CAPSULE: 8 billion cell | ORAL | Qty: 1

## 2020-11-18 MED FILL — LIPITOR 10 MG TABLET: 10 mg | ORAL | Qty: 2

## 2020-11-18 MED FILL — AZITHROMYCIN 500 MG IV SOLUTION: 500 mg | INTRAVENOUS | Qty: 5

## 2020-11-18 MED FILL — RISAQUAD-2  16 BILLION CELL CAPSULE: 16 billion cell | ORAL | Qty: 1

## 2020-11-18 MED FILL — ACEPHEN 650 MG RECTAL SUPPOSITORY: 650 mg | RECTAL | Qty: 1

## 2020-11-18 NOTE — Progress Notes (Signed)
 TRANSITION OF CARE - SBAR OUT    Patient is being transferred to [facility], Room# 2212. Report GIVEN TO Penne, RN on Verne[facility] FORBES [place] for routine progression of care.  Report is consisted of the following information SBAR, Kardex, ED Summary, Intake/Output, MAR, Recent Results, and Cardiac Rhythm NSR . Patient transferred to receiving unit by: Transport (RN or Tech Name)     Called outstanding consults: Yes   Collected routine labs: Yes     All current orders reviewed with accepting nurse: Yes    The following personal items will be sent with the patient during transfer to the floor:  All valuables:      Visual Aid: None                   CARDIAC MONITORING ORDERED: Yes for 72 hrs    The following CURRENT information were reported to the receiving RN:    CODE STATUS: Full Code    NIH SCORE:    YALE SCREENING:      NEURO ASSESSMENT: Neuro  Neurologic State: Alert (11/18/20 0755)      RESTRAINTS IN USE: No      IS DOCUMENTATION COMPLETE: Yes      Vital Signs  Level of Consciousness: Alert (0) (11/18/20 1128)  Temp: 98 F (36.7 C) (11/18/20 0755)  Temp Source: Oral (11/18/20 0755)  Pulse (Heart Rate): 78 (11/18/20 1200)  Heart Rate Source: Monitor (11/18/20 1128)  Cardiac Rhythm: Sinus Rhythm (11/18/20 1200)  Resp Rate: 22 (11/18/20 1128)  BP: 129/83 (11/18/20 1128)  MAP (Monitor): 99 (11/18/20 1128)  MAP (Calculated): 98 (11/18/20 1128)  BP Patient Position: At rest (11/17/20 1712)  MEWS Score: 2 (11/18/20 0755)  Pain 1  Pain Scale 1: Numeric (0 - 10) (11/18/20 1000)  Pain Intensity 1: 2 (11/18/20 1000)  Patient Stated Pain Goal: 2 (11/18/20 1000)  Pain Reassessment 1: Yes (11/18/20 1000)  Pain Onset 1: acute (11/18/20 0914)  Pain Location 1: Back (11/18/20 0914)  Pain Orientation 1: Mid, Upper (11/18/20 0914)  Pain Description 1: Aching, Throbbing (11/18/20 0914)  Pain Intervention(s) 1: Medication (see MAR) (11/18/20 0914)      OXYGEN: Oxygen Therapy  O2 Device: Nasal cannula (11/18/20 0755)  Skin  Assessment: Clean, dry, & intact (11/18/20 0755)  O2 Flow Rate (L/min): 2 l/min (11/18/20 0755)    KINDER FALL ASSESSMENT:  Presents to emergency department  because of falls (Syncope, seizure, or loss of consciousness): No, Age > 70: No, Altered Mental Status, Intoxication with alcohol or substance confusion (Disorientation, impaired judgment, poor safety awaremess, or inability to follow instructions): Yes, Impaired Mobility: Ambulates or transfers with assistive devices or assistance; Unable to ambulate or transer.: No, Nursing Judgement : Yes    WOUNDS: No      URINARY CATHETER: voiding    LINE ACCESS:   Peripheral IV 11/17/20 Left Forearm (Active)   Site Assessment Clean, dry, & intact 11/18/20 0755   Phlebitis Assessment 0 11/18/20 0755   Infiltration Assessment 0 11/18/20 0755   Dressing Status Clean, dry, & intact 11/18/20 0755   Dressing Type Tape 11/18/20 0755   Hub Color/Line Status Pink;Flushed;Capped;Patent 11/18/20 0755   Action Taken Blood drawn 11/17/20 1531   Alcohol Cap Used Yes 11/18/20 0755       Peripheral IV 11/18/20 Left Basilic (Active)   Site Assessment Clean, dry, & intact 11/18/20 1239   Phlebitis Assessment 0 11/18/20 1239   Infiltration Assessment 0 11/18/20 1239   Dressing  Status Clean, dry, & intact 11/18/20 1239   Dressing Type Tape 11/18/20 1239   Hub Color/Line Status Pink;Flushed;Patent 11/18/20 1239        Opportunity for questions and clarification were provided.  Abigail Andrew

## 2020-11-18 NOTE — Progress Notes (Signed)
Progress Notes by Little Ishikawa, MD at 11/18/20 [phone]                Author: Little Ishikawa, MD  Service: Internal Medicine  Author Type: Physician       Filed: 11/18/20 1735  Date of Service: 11/18/20 0936  Status: Signed          Editor: Little Ishikawa, MD (Physician)                       Hospitalist Progress Note      NAME: Christina Stevens    DOB:  [birth year]    MRN:  [id number]          Assessment / Plan:        Community-acquired pneumonia    Chest x-ray showed bilateral upper lobe infiltrates   Continue ceftriaxone and azithromycin  Sputum culture, blood culture  Urine strep and Legionella. Rapid covid/ flu negative, Covid PCR pending   Robitussin as needed       Generalized weakness:   Has weakness greater on lower > upper extremity, could be d/t pneumonia/ infection   CT thoracic and lumbar spine show no significant spinal stenosis   Physical therapy evaluation   Pain control with morphine       ADHD  Continue with Adderal    Hypercholesterolemia   Cw statin       PCOS:  Takes medical marijuana at home       Active smoker:   Nicotine patch         30.0 - 39.9 Obese /  Body mass index is 31.74 kg/m.      Estimated discharge date: October 20   Barriers:      Code status: Full   Prophylaxis: Lovenox   Recommended Disposition: Home w/Family          Subjective:        Chief Complaint / Reason for Physician Visit   "".  Discussed with RN events overnight.  Continue IV antibiotic treatment for pneumonia, follow-up progression      Review of Systems:           Symptom  Y/N  Comments    Symptom  Y/N  Comments             Fever/Chills  n      Chest Pain  n               Poor Appetite        Edema                 Cough        Abdominal Pain         Sputum        Joint Pain         SOB/DOE  yy      Pruritis/Rash         Nausea/vomit        Tolerating PT/OT         Diarrhea        Tolerating Diet  y               Constipation        Other               Could NOT obtain due to:            Objective:         VITALS:  Last 24hrs VS reviewed since prior progress note. Most recent are:   Patient Vitals for the past 24 hrs:            Temp  Pulse  Resp  BP  SpO2            11/18/20 0800  --  75  --  --  --            11/18/20 0755  98 F (36.7 C)  75  22  127/81  95 %     11/18/20 0311  98.2 F (36.8 C)  75  29  95/79  97 %     11/17/20 1712  --  85  25  --  97 %     11/17/20 1627  --  93  20  --  94 %     11/17/20 1500  98.6 F (37 C)  84  24  112/66  95 %     11/17/20 1442  --  81  23  --  92 %     11/17/20 1416  --  74  23  --  93 %     11/17/20 1356  --  85  22  --  92 %     11/17/20 1245  --  82  23  114/79  93 %     11/17/20 1202  --  99  22  119/83  93 %            11/17/20 1147  --  95  23  136/71  91 %        No intake or output data in the 24 hours ending 11/18/20 0936       I had a face to face encounter and independently examined this patient on 11/18/2020, as outlined below:   PHYSICAL EXAM:   General: WD, WN. Alert, cooperative, no acute distress     EENT:  EOMI. Anicteric sclerae. MMM   Resp:  CTA bilaterally, no wheezing or rales.  No accessory muscle use   CV:  Regular  rhythm,  No edema   GI:  Soft, Non distended, Non tender.  +Bowel sounds   Neurologic:  Alert and oriented X 3, normal speech,    Psych:   Good insight. Not anxious nor agitated   Skin:  No rashes.  No jaundice      Reviewed most current lab test results and cultures  YES   Reviewed most current radiology test results   YES   Review and summation of old records today    NO   Reviewed patient's current orders and MAR    YES   PMH/SH reviewed - no change compared to H&P   ________________________________________________________________________   Care Plan discussed with:           Comments         Patient  y           Family              RN  y       Research scientist (physical sciences)                                  Multidiciplinary team rounds were held today with case manager, nursing, pharmacist and Higher education careers adviser.  Patient's  plan of  care was discussed; medications  were reviewed and discharge planning was addressed.       ________________________________________________________________________   Total NON critical care TIME:  35    Minutes      Total CRITICAL CARE TIME Spent:   Minutes non procedure based              Comments         >50% of visit spent in counseling and coordination of care         ________________________________________________________________________   Little Ishikawa, MD       Procedures: see electronic medical records for all procedures/Xrays and details which were not copied into this note but were reviewed prior to creation of Plan.        LABS:   I reviewed today's most current labs and imaging studies.   Pertinent labs include:     Recent Labs            11/18/20   0301  11/17/20   1007     WBC  9.3  10.4     HGB  14.3  14.7     HCT  42.4  42.1         PLT  201  234          Recent Labs            11/18/20   0301  11/17/20   1007     NA  140  136     K  3.9  3.4*     CL  106  105     CO2  27  25     GLU  120*  116*     BUN  4*  7     CREA  0.83  0.94     CA  8.3*  8.2*     ALB   --   3.5     TBILI   --   0.4         ALT   --   35           Signed: Surina Storts Lajuana Matte, MD

## 2020-11-19 MED ORDER — FLU VACCINE QS 2022-23(6MOS UP)(PF) 60 MCG(15 MCGX4)/0.5 ML IM SYRINGE
60 mcg (15 mcg x 4)/0.5 mL | INTRAMUSCULAR | Status: AC
Start: 2020-11-19 — End: 2020-11-24

## 2020-11-19 MED FILL — MAPAP (ACETAMINOPHEN) 325 MG TABLET: 325 mg | ORAL | Qty: 2

## 2020-11-19 MED FILL — MORPHINE 2 MG/ML INJECTION: 2 mg/mL | INTRAMUSCULAR | Qty: 1

## 2020-11-19 MED FILL — POTASSIUM CHLORIDE SR 20 MEQ TAB, PARTICLES/CRYSTALS: 20 mEq | ORAL | Qty: 1

## 2020-11-19 MED FILL — ENOXAPARIN 40 MG/0.4 ML SUB-Q SYRINGE: 40 mg/0.4 mL | SUBCUTANEOUS | Qty: 0.4

## 2020-11-19 MED FILL — AMPHETAMINE-DEXTROAMPHETAMINE 10 MG TAB: 10 mg | ORAL | Qty: 4

## 2020-11-19 MED FILL — AZITHROMYCIN 500 MG IV SOLUTION: 500 mg | INTRAVENOUS | Qty: 5

## 2020-11-19 MED FILL — DEXTROMETHORPHAN-GUAIFENESIN 10 MG-100 MG/5 ML SYRUP: 100-10 mg/5 mL | ORAL | Qty: 10

## 2020-11-19 MED FILL — LIPITOR 20 MG TABLET: 20 mg | ORAL | Qty: 1

## 2020-11-19 MED FILL — ONDANSETRON (PF) 4 MG/2 ML INJECTION: 4 mg/2 mL | INTRAMUSCULAR | Qty: 2

## 2020-11-19 MED FILL — RISAQUAD 8 BILLION CELL CAPSULE: 8 billion cell | ORAL | Qty: 1

## 2020-11-19 MED FILL — NICOTINE 14 MG/24 HR DAILY PATCH: 14 mg/24 hr | TRANSDERMAL | Qty: 1

## 2020-11-19 MED FILL — CEFTRIAXONE 1 GRAM SOLUTION FOR INJECTION: 1 gram | INTRAMUSCULAR | Qty: 1

## 2020-11-19 NOTE — Progress Notes (Deleted)
Problem: Falls - Risk of  Goal: *Absence of Falls  Description: Document Christina Stevens Fall Risk and appropriate interventions in the flowsheet.  Outcome: Progressing Towards Goal  Note: Fall Risk Interventions:  Mobility Interventions: Assess mobility with egress test, Bed/chair exit alarm         Medication Interventions: Assess postural VS orthostatic hypotension, Bed/chair exit alarm    Elimination Interventions: Patient to call for help with toileting needs, Call light in reach, Bed/chair exit alarm    History of Falls Interventions: Bed/chair exit alarm, Door open when patient unattended         Problem: Patient Education: Go to Patient Education Activity  Goal: Patient/Family Education  Outcome: Progressing Towards Goal     Problem: Pain  Goal: *Control of Pain  Outcome: Progressing Towards Goal     Problem: Patient Education: Go to Patient Education Activity  Goal: Patient/Family Education  Outcome: Progressing Towards Goal     Problem: Pneumonia: Day 1  Goal: Nutrition/Diet  Outcome: Progressing Towards Goal

## 2020-11-19 NOTE — Progress Notes (Signed)
Problem: Falls - Risk of  Goal: *Absence of Falls  Description: Document Christina Stevens Fall Risk and appropriate interventions in the flowsheet.  Outcome: Progressing Towards Goal  Note: Fall Risk Interventions:  Mobility Interventions: Assess mobility with egress test, Bed/chair exit alarm, Communicate number of staff needed for ambulation/transfer, Patient to call before getting OOB         Medication Interventions: Assess postural VS orthostatic hypotension, Bed/chair exit alarm, Patient to call before getting OOB, Teach patient to arise slowly    Elimination Interventions: Bed/chair exit alarm, Call light in reach, Stay With Me (per policy), Toilet paper/wipes in reach, Toileting schedule/hourly rounds    History of Falls Interventions: Bed/chair exit alarm, Consult care management for discharge planning, Door open when patient unattended, Investigate reason for fall, Room close to nurse's station         Problem: Pain  Goal: *Control of Pain  Outcome: Progressing Towards Goal  Goal: *PALLIATIVE CARE:  Alleviation of Pain  Outcome: Progressing Towards Goal     Problem: Pneumonia: Day 2  Goal: Off Pathway (Use only if patient is Off Pathway)  Outcome: Progressing Towards Goal  Goal: Activity/Safety  Outcome: Progressing Towards Goal  Goal: Consults, if ordered  Outcome: Progressing Towards Goal  Goal: Diagnostic Test/Procedures  Outcome: Progressing Towards Goal  Goal: Nutrition/Diet  Outcome: Progressing Towards Goal  Goal: Discharge Planning  Outcome: Progressing Towards Goal  Goal: Medications  Outcome: Progressing Towards Goal  Goal: Respiratory  Outcome: Progressing Towards Goal  Goal: Treatments/Interventions/Procedures  Outcome: Progressing Towards Goal  Goal: Psychosocial  Outcome: Progressing Towards Goal  Goal: *Oxygen saturation within defined limits  Outcome: Progressing Towards Goal  Goal: *Hemodynamically stable  Outcome: Progressing Towards Goal  Goal: *Demonstrates progressive activity  Outcome:  Progressing Towards Goal  Goal: *Tolerating diet  Outcome: Progressing Towards Goal  Goal: *Optimal pain control at patient's stated goal  Outcome: Progressing Towards Goal

## 2020-11-19 NOTE — Progress Notes (Signed)
Progress  Notes by Avel Sensor, MD at 11/19/20 1622                Author: Avel Sensor, MD  Service: Internal Medicine  Author Type: Physician       Filed: 11/19/20 1625  Date of Service: 11/19/20 1622  Status: Signed          Editor: Avel Sensor, MD (Physician)                       Hospitalist Progress Note      NAME: Christina Stevens    DOB:  October 01, 1980    MRN:  [id number]          Assessment / Plan:        Community-acquired pneumonia    Chest x-ray showed bilateral upper lobe infiltrates  Diffuse bilateral consolidation, groundglass  opacities was seen on CT spine  Continue ceftriaxone and azithromycin  Sputum culture, blood culture  Urine strep and Legionella.  Rapid covid/ flu negative   We will send respiratory virus panel due to high suspicious for RSV/influenza/enterovirus   Robitussin as needed  Currently on 3 L oxygen via nasal cannula, continue to wean off as tolerated       Generalized weakness:   Has weakness greater on lower > upper extremity, could be d/t pneumonia/ infection   CT thoracic and lumbar spine show no significant spinal stenosis   Physical therapy evaluation   Pain control with morphine         ADHD  Continue with Adderal    Hypercholesterolemia   Cw statin       PCOS:  Takes medical marijuana at home       Active smoker:   Counseling was provided about smoke cessation  Patient report that she will not smoke anymore         30.0 - 39.9 Obese /  Body mass index is 31.74 kg/m??.      Estimated discharge date: October 20   Barriers: Clinical improvement      Code status: Full   Prophylaxis: Lovenox   Recommended Disposition: Home w/Family          Subjective:        Patient was seen and examined.   No acute events overnight.      Discussed with RN overnight events.      All patient's questions were answered.      Patient's family was at bedside, all questions were answered.    "Still feeling short of breath"      Review of Systems:           Symptom  Y/N  Comments    Symptom   Y/N  Comments             Fever/Chills  n      Chest Pain  n               Poor Appetite        Edema         Cough  n      Abdominal Pain  n               Sputum        Joint Pain         SOB/DOE  y      Pruritis/Rash         Nausea/vomit  n      Tolerating PT/OT  Diarrhea        Tolerating Diet  y               Constipation        Other               Could NOT obtain due to:                  Objective:        VITALS:    Last 24hrs VS reviewed since prior progress note. Most recent are:   Patient Vitals for the past 24 hrs:            Temp  Pulse  Resp  BP  SpO2            11/19/20 1457  97.9 ??F (36.6 ??C)  72  18  (!) 147/78  98 %            11/19/20 1041  97.4 ??F (36.3 ??C)  66  22  (!) 152/97  98 %     11/19/20 0758  97.8 ??F (36.6 ??C)  68  20  133/87  91 %     11/19/20 0339  97.6 ??F (36.4 ??C)  60  22  (!) 142/97  92 %     11/19/20 0030  98 ??F (36.7 ??C)  65  29  115/86  96 %            11/18/20 1932  98.4 ??F (36.9 ??C)  72  20  102/89  95 %              Intake/Output Summary (Last 24 hours) at 11/19/2020 1622   Last data filed at 11/19/2020 0915     Gross per 24 hour        Intake  240 ml        Output  --        Net  240 ml               I had a face to face encounter and independently examined this patient on 11/19/2020, as outlined below:   PHYSICAL EXAM:   General: WD, WN. Alert, cooperative, no acute distress     EENT:  EOMI. Anicteric sclerae. MMM   Resp:  CTA bilaterally, no wheezing but bilateral rales. no accessory muscle use   CV:  Regular  rhythm,  No edema   GI:  Soft, Non distended, Non tender.  +Bowel sounds   Neurologic:  EOMs intact. No facial asymmetry. No aphasia or slurred speech. Symmetrical strength, Sensation grossly intact. Alert and oriented X 4.       Psych:   Good insight. Not anxious nor agitated   Skin:  No rashes.  No jaundice      Reviewed most current lab test results and cultures  YES   Reviewed most current radiology test results   YES   Review and summation of old records  today    NO   Reviewed patient's current orders and MAR    YES   PMH/SH reviewed - no change compared to H&P   ________________________________________________________________________   Care Plan discussed with:           Comments         Patient  y           Family              RN  y  Care Manager             Consultant                                  Multidiciplinary team rounds were held today with case manager, nursing, pharmacist and clinical coordinator.  Patient's plan of care was discussed; medications  were reviewed and discharge planning was addressed.                     Comments         >50% of visit spent in counseling and coordination of care         ________________________________________________________________________   Avel Sensor, MD       Procedures: see electronic medical records for all procedures/Xrays and details which were not copied into this note but were reviewed prior to creation of Plan.        LABS:   I reviewed today's most current labs and imaging studies.   Pertinent labs include:     Recent Labs            11/18/20   0301  11/17/20   1007     WBC  9.3  10.4     HGB  14.3  14.7     HCT  42.4  42.1         PLT  201  234             Recent Labs            11/18/20   0301  11/17/20   1007     NA  140  136     K  3.9  3.4*     CL  106  105     CO2  27  25     GLU  120*  116*     BUN  4*  7     CREA  0.83  0.94     CA  8.3*  8.2*     ALB   --   3.5     TBILI   --   0.4         ALT   --   35              Signed: Avel Sensor, MD

## 2020-11-19 NOTE — Progress Notes (Signed)
Problem: Self Care Deficits Care Plan (Adult)  Goal: *Acute Goals and Plan of Care (Insert Text)  Description: FUNCTIONAL STATUS PRIOR TO ADMISSION: Independent with ADL and IADL. Still working full time as an Optometrist. Drives.    HOME SUPPORT: The patient lives with her wife but did not require assist.    Occupational Therapy Goals  Initiated 11/19/2020  1.  Patient will perform grooming standing at the sink with modified independence within 7 day(s).  2.  Patient will perform bathing with modified independence within 7 day(s).  3.  Patient will perform lower body dressing with modified independence within 7 day(s).  4.  Patient will perform toilet transfers with modified independence within 7 day(s).  5.  Patient will perform all aspects of toileting with modified independence within 7 day(s).  6.  Patient will independently utilize energy conservation and recommended equipment during ADL within 7 day(s).     Outcome: Not Met    OCCUPATIONAL THERAPY EVALUATION  Patient: Christina Stevens (40 y.o. female)  Date: 11/19/2020  Primary Diagnosis: Community acquired pneumonia [J18.9]       Precautions:  Fall    ASSESSMENT  Based on the objective data described below, the patient presents with deficits in self-care, primarily due to decreased activity tolerance and general weakness. She requires intermittent cues for safety, mostly to slow down due to multiple lines/attachments to prevent falls. Initiated ADL training and education of energy conservation. Patient was on 2 liters O2 throughout session, with SpO2 93% or higher throughout. Shortness of breath noted with minimal activity. Patient does better with the RW for now. Encouraged use of the incentive spirometer.    Current Level of Function Impacting Discharge (ADLs/self-care): CG to Min A    Functional Outcome Measure:  The patient scored 65/100 on the Barthel Index      Other factors to consider for discharge: N/A     Patient will benefit from skilled therapy  intervention to address the above noted impairments.       PLAN :  Recommendations and Planned Interventions: self care training, functional mobility training, therapeutic exercise, therapeutic activities, endurance activities, patient education, home safety training, and other: energy conservation    Frequency/Duration: Patient will be followed by occupational therapy 3 times a week to address goals.    Recommendation for discharge: (in order for the patient to meet his/her long term goals)  No skilled occupational therapy/ follow up rehabilitation needs identified at this time.    This discharge recommendation:  A follow-up discussion with the attending provider and/or case management is planned    IF patient discharges home will need the following DME: TBD       SUBJECTIVE:   Patient stated ???This came on all of a sudden.???    OBJECTIVE DATA SUMMARY:   HISTORY:   Past Medical History:   Diagnosis Date    ADHD     Epstein Barr infection     MRSA (methicillin resistant staph aureus) culture positive     MS (congenital mitral stenosis)     PCOS (polycystic ovarian syndrome)      Past Surgical History:   Procedure Laterality Date    HX APPENDECTOMY      IR CHOLECYSTOSTOMY PERCUTANEOUS         Expanded or extensive additional review of patient history:     Home Situation  Home Environment: Private residence  # Steps to Enter: 4  Rails to Enter: Yes  [employer] Services : Bilateral  One/Two  Story Residence: Two Actuary of Interior Steps: 12  Interior Rails: Both  Living Alone: No  Support Systems: Spouse/Significant Other  Patient Expects to be Discharged to:: Home with home health  Current DME Used/Available at Home: None  Tub or Shower Type: Tub/Shower combination    Hand dominance: Right    EXAMINATION OF PERFORMANCE DEFICITS:  Cognitive/Behavioral Status:  Neurologic State: Alert  Orientation Level: Oriented X4  Cognition: Appropriate decision making;Follows commands  Perception: Appears intact  Perseveration: No  perseveration noted  Safety/Judgement: Awareness of environment;Insight into deficits;Home safety;Fall prevention    Range of Motion:  AROM: Generally decreased, functional                         Strength:  Strength: Generally decreased, functional                Coordination:  Coordination: Within functional limits  Fine Motor Skills-Upper: Left Intact;Right Intact    Gross Motor Skills-Upper: Left Intact;Right Intact    Tone & Sensation:  Tone: Normal  Sensation: Impaired (pt endorsing waking up with occasional numbness from waist down. Being followed by [facility] neurology and rheum.)                      Balance:  Sitting: Intact  Standing: Impaired;With support  Standing - Static: Constant support;Good  Standing - Dynamic : Constant support;Fair    Functional Mobility and Transfers for ADLs:  Bed Mobility:  Rolling: Independent  Supine to Sit: Independent  Scooting: Independent    Transfers:  Sit to Stand: Contact guard assistance  Stand to Sit: Contact guard assistance  Bed to Chair: Contact guard assistance  Bathroom Mobility: Contact guard assistance  Toilet Transfer : Contact guard assistance    ADL Assessment:  Feeding: Independent    Oral Facial Hygiene/Grooming: Contact guard assistance    Bathing: Contact guard assistance    Type of Bath: Chlorhexidine (CHG) (said she could do it so i brought her stuff.)    Upper Body Dressing: Setup    Lower Body Dressing: Minimum assistance    Toileting: Contact guard assistance                  ADL Intervention and task modifications:  Initiated ADL training, including education of compensatory techniques. Recommended sitting when able during basic self-care. Patient initially completed functional mobility to the bathroom with hand-held assist, but was agreeable to use the RW. Initiated education of energy conservation, with focus today on pacing, planning, and prioritizing. Patient indicates understanding.              Cognitive Retraining  Safety/Judgement: Awareness of  environment;Insight into deficits;Home safety;Fall prevention    Functional Measure:    Barthel Index:  Bathing: 5  Bladder: 10  Bowels: 10  Grooming: 5  Dressing: 5  Feeding: 10  Mobility: 0  Stairs: 0  Toilet Use: 10  Transfer (Bed to Chair and Back): 10  Total: 65/100      The Barthel ADL Index: Guidelines  1. The index should be used as a record of what a patient does, not as a record of what a patient could do.  2. The main aim is to establish degree of independence from any help, physical or verbal, however minor and for whatever reason.  3. The need for supervision renders the patient not independent.  4. A patient's performance should be established using the best available evidence.  Asking the patient, friends/relatives and nurses are the usual sources, but direct observation and common sense are also important. However direct testing is not needed.  5. Usually the patient's performance over the preceding 24-48 hours is important, but occasionally longer periods will be relevant.  6. Middle categories imply that the patient supplies over 50 per cent of the effort.  7. Use of aids to be independent is allowed.    Score Interpretation (from [place])   80-100 Independent   60-79 Minimally independent   40-59 Partially dependent   20-39 Very dependent   <20 Totally dependent     -Mahoney, F.l., Barthel, D.W. (1965). Functional evaluation: the Barthel Index. [place] ([place]ushman., [place] (1997). The Barthel activities of daily living index: self-reporting versus actual performance in the old (> or = 75 years). Journal of [place] 45(7), Marcellus, J.J.M.F, Noni Saupe., Minette [place]. (1999). Measuring the change in disability after inpatient rehabilitation; comparison of the responsiveness of the Barthel Index and Functional Independence Measure. Journal of Neurology, Neurosurgery, and Psychiatry, 66(4), 838 309 6251.  Wilford Sports, N.J.A, Scholte op  Gomer,  W.J.M, & Koopmanschap, M.A. (2004) Assessment of post-stroke quality of life in cost-effectiveness studies: The usefulness of the Barthel Index and the EuroQoL-5D. Quality of Life Research, 97, 427-43        Occupational Therapy Evaluation Charge Determination   History Examination Decision-Making   LOW Complexity : Brief history review  LOW Complexity : 1-3 performance deficits relating to physical, cognitive , or psychosocial skils that result in activity limitations and / or participation restrictions  LOW Complexity : No comorbidities that affect functional and no verbal or physical assistance needed to complete eval tasks       Based on the above components, the patient evaluation is determined to be of the following complexity level: LOW   Pain Rating:  No complaints    Activity Tolerance:   Fair    After treatment patient left in no apparent distress:    Sitting in chair, Call bell within reach, and Bed / chair alarm activated    COMMUNICATION/EDUCATION:   The patient???s plan of care was discussed with: Physical therapist and Registered nurse.     Patient/family have participated as able in goal setting and plan of care.    This patient???s plan of care is appropriate for delegation to OTA.    Thank you for this referral.  Holly Bodily, OTR/L  Time Calculation: 35 mins

## 2020-11-19 NOTE — Progress Notes (Signed)
Received notification from bedside RN about patient with regards to: persistent cough causing discomfort and triggers anxiety, requesting medication to help her relax and rest  VS: BP 120/89, HR 80, RR 18, O2 sat 95% on NC 2 L    Intervention given: Klonopin 0.5 mg PO x 1 dose ordered

## 2020-11-19 NOTE — Progress Notes (Signed)
Problem: Falls - Risk of  Goal: *Absence of Falls  Description: Document Christina Stevens Fall Risk and appropriate interventions in the flowsheet.  Outcome: Progressing Towards Goal  Note: Fall Risk Interventions:  Mobility Interventions: Bed/chair exit alarm, Patient to call before getting OOB         Medication Interventions: Bed/chair exit alarm    Elimination Interventions: Call light in reach, Bed/chair exit alarm    History of Falls Interventions: Bed/chair exit alarm         Problem: Falls - Risk of  Goal: *Absence of Falls  Description: Document Christina Stevens Fall Risk and appropriate interventions in the flowsheet.  Outcome: Progressing Towards Goal  Note: Fall Risk Interventions:  Mobility Interventions: Bed/chair exit alarm, Patient to call before getting OOB         Medication Interventions: Bed/chair exit alarm    Elimination Interventions: Call light in reach, Bed/chair exit alarm    History of Falls Interventions: Bed/chair exit alarm         Problem: Pain  Goal: *Control of Pain  Outcome: Progressing Towards Goal     Problem: Pain  Goal: *Control of Pain  Outcome: Progressing Towards Goal     Problem: Patient Education: Go to Patient Education Activity  Goal: Patient/Family Education  Outcome: Progressing Towards Goal     Problem: Patient Education: Go to Patient Education Activity  Goal: Patient/Family Education  Outcome: Progressing Towards Goal     Problem: Pneumonia: Day 1  Goal: Off Pathway (Use only if patient is Off Pathway)  Outcome: Progressing Towards Goal     Problem: Pneumonia: Day 1  Goal: Off Pathway (Use only if patient is Off Pathway)  Outcome: Progressing Towards Goal

## 2020-11-19 NOTE — Progress Notes (Signed)
 Transition of Care Plan  RUR: 6%  DISPOSITION: The disposition plan is pending medical progression  F/U with PCP/Specialist    Transport: family       Reason for Admission:  community acquired pneumonia                      RUR Score:    6%                Plan for utilizing home health:    HHPT/OT recommended       PCP: First and Last name:  Coggin, Ozell RODES, NP     Name of Practice:    Are you a current patient: Yes/No:    Approximate date of last visit:    Can you participate in a virtual visit with your PCP:                     Current Advanced Directive/Advance Care Plan: Full Code      Healthcare Decision Maker:   Click here to complete HealthCare Decision Makers including selection of the Healthcare Decision Maker Relationship (ie Primary)                             Transition of Care Plan:                      CRM briefly spoke with patient, introduced self, explained role, verified demographics, and offered assistance. Patient  lives with her spouse in a home. Patient is independent in her ADLs/IADLs.       Care Management Interventions  PCP Verified by CM: Yes  Palliative Care Criteria Met (RRAT>21 & CHF Dx)?: No  Mode of Transport at Discharge: Other (see comment) (spouse)  Transition of Care Consult (CM Consult): Discharge Planning  MyChart Signup: No  Discharge Durable Medical Equipment: No  Health Maintenance Reviewed: Yes  Physical Therapy Consult: Yes  Occupational Therapy Consult: Yes  Speech Therapy Consult: No  Support Systems: Spouse/Significant Other  Confirm Follow Up Transport: Family  [employer] Information Provided?: No  Discharge Location  Patient Expects to be Discharged to:: Home with home health    6:47 PM  Leotis Mandes, CRM

## 2020-11-19 NOTE — Progress Notes (Signed)
 Problem: Mobility Impaired (Adult and Pediatric)  Goal: *Therapy Goal (Edit Goal, Insert Text)  Description: FUNCTIONAL STATUS PRIOR TO ADMISSION: Pt independent and working PTA. Pt reports fluctuating periods of numbness and weakness in B LEs. Being followed by [facility] physicians per pt report for neuro and rheum workup.     HOME SUPPORT PRIOR TO ADMISSION: Lives with wife in 2 story home, bedroom upstairs. 4 STE with B HR, full flight inside with B HR (3 steps, landing, 8 steps). Pt owns no DME.     Physical Therapy Goals  Initiated 11/19/2020  1.  Patient will move from supine to sit and sit to supine , scoot up and down, and roll side to side in bed with independence within 7 day(s).    2.  Patient will transfer from bed to chair and chair to bed with supervision/set-up using the least restrictive device within 7 day(s).  3.  Patient will perform sit to stand with supervision/set-up within 7 day(s).  4.  Patient will ambulate with supervision/set-up for 150 feet with the least restrictive device within 7 day(s).   5.  Patient will ascend/descend 4 stairs with B handrail(s) with minimal assistance/contact guard assist within 7 day(s).   Outcome: Progressing Towards Goal   PHYSICAL THERAPY EVALUATION  Patient: Christina Stevens (40 y.o. female)  Date: 11/19/2020  Primary Diagnosis: Community acquired pneumonia [J18.9]       Precautions: Fall risk       ASSESSMENT  Based on the objective data described below, the patient presents from home with shortness of breath and increased weakness. Pt positive for PNA, now on 2L O2 via NC for sats >90% at rest and with mobility. Pt received for PT evaluation seated EOB, reporting urgent need to toilet. Pt performed sit <> stand with B HHA and Min A overall, ambulated bed > commode ~3ft with furniture walking and overall Min A, very unsteady. Toileting tasks performed in bathroom with OT assist/assessment. Pt demonstrated further sit <> stands with CGA and RW for steadying. Pt  walked 10ft end of session with RW and CGA. 2-3 episodes of buckling of B LEs requiring min A and cues for use of UEs on RW to prevent fall. Education provided on safety, ambulating with staff assist, and prevention of falls. Pt left seated up in recliner chair, B LE elevated, call bell in lap and chair alarm on. 2L O2 via NC at end of session with O2 sat 93%. Pt remains below her independent baseline and likely would benefit from [facility] PT vs. OP PT to address strengthening and endurance. Will continue to follow.    Current Level of Function Impacting Discharge (mobility/balance): up to Min A with RW, B LE buckling    Functional Outcome Measure:  The patient scored 65/100 on the Barthel Index outcome measure which is indicative of 35% impairment in mobility and ADLs.      Other factors to consider for discharge: lives with wife, works from home.     Patient will benefit from skilled therapy intervention to address the above noted impairments.       PLAN :  Recommendations and Planned Interventions: bed mobility training, transfer training, gait training, therapeutic exercises, neuromuscular re-education, patient and family training/education, and therapeutic activities      Frequency/Duration: Patient will be followed by physical therapy:  4 times a week to address goals.    Recommendation for discharge: (in order for the patient to meet his/her long term goals)  Physical therapy  at least 2 days/week in the home     This discharge recommendation:  Has been made in collaboration with the attending provider and/or case management    IF patient discharges home will need the following DME: rolling walker         SUBJECTIVE:   Patient stated "I normally do a lot better than this."    OBJECTIVE DATA SUMMARY:   HISTORY:    Past Medical History:   Diagnosis Date    ADHD     Elbert Shine infection     MRSA (methicillin resistant staph aureus) culture positive     MS (congenital mitral stenosis)     PCOS (polycystic ovarian  syndrome)      Past Surgical History:   Procedure Laterality Date    HX APPENDECTOMY      IR CHOLECYSTOSTOMY PERCUTANEOUS         Personal factors and/or comorbidities impacting plan of care: highly motivated, previously independent.    Home Situation  Home Environment: Private residence  # Steps to Enter: 4  Rails to Enter: Yes  Hand Rails : Bilateral  One/Two Story Residence: Two Nutritional therapist of Interior Steps: 12  Interior Rails: Both  Living Alone: No  Support Systems: Spouse/Significant Other  Patient Expects to be Discharged to:: Home with home health  Current DME Used/Available at Home: None  Tub or Shower Type: Tub/Shower combination    EXAMINATION/PRESENTATION/DECISION MAKING:   Critical Behavior:  Neurologic State: Alert  Orientation Level: Oriented X4  Cognition: Appropriate decision making, Follows commands     Hearing:  Auditory  Auditory Impairment: None  Skin:  appears intact  Edema: none noted.  Range Of Motion:  AROM: Generally decreased, functional                       Strength:    Strength: Generally decreased, functional                    Tone & Sensation:   Tone: Normal              Sensation: Impaired (pt endorsing waking up with occasional numbness from waist down. Being followed by [facility] neurology and rheum.)               Coordination:  Coordination: Within functional limits  Vision:      Functional Mobility:  Bed Mobility:  Rolling: Independent  Supine to Sit: Independent     Scooting: Independent  Transfers:  Sit to Stand: Contact guard assistance  Stand to Sit: Contact guard assistance        Bed to Chair: Contact guard assistance              Balance:   Sitting: Intact  Standing: Impaired;With support  Standing - Static: Constant support;Good  Standing - Dynamic : Constant support;Fair  Ambulation/Gait Training:  Distance (ft): 20 Feet (ft)  Assistive Device: Gait belt;Walker, rolling  Ambulation - Level of Assistance: Contact guard assistance        Gait Abnormalities: Decreased step  clearance;Path deviations;Shuffling gait (B knee buckling with fatigue)        Base of Support: Widened     Speed/Cadence: Shuffled;Slow;Pace decreased (<100 feet/min)  Step Length: Right shortened;Left shortened                     Functional Measure:  Barthel Index:    Bathing: 5  Bladder: 10  Bowels: 10  Grooming: 5  Dressing: 5  Feeding: 10  Mobility: 0  Stairs: 0  Toilet Use: 10  Transfer (Bed to Chair and Back): 10  Total: 65/100       The Barthel ADL Index: Guidelines  1. The index should be used as a record of what a patient does, not as a record of what a patient could do.  2. The main aim is to establish degree of independence from any help, physical or verbal, however minor and for whatever reason.  3. The need for supervision renders the patient not independent.  4. A patient's performance should be established using the best available evidence. Asking the patient, friends/relatives and nurses are the usual sources, but direct observation and common sense are also important. However direct testing is not needed.  5. Usually the patient's performance over the preceding 24-48 hours is important, but occasionally longer periods will be relevant.  6. Middle categories imply that the patient supplies over 50 per cent of the effort.  7. Use of aids to be independent is allowed.    Score Interpretation (from Sinoff 1997)   80-100 Independent   60-79 Minimally independent   40-59 Partially dependent   20-39 Very dependent   <20 Totally dependent     -Mahoney, F.l., Barthel, D.W. (1965). Functional evaluation: the Barthel Index. [place] [address] Med J (14)2.  -Sinoff, G., Ore, L. (1997). The Barthel activities of daily living index: self-reporting versus actual performance in the old (> or = 75 years). Journal of American Geriatric Society 45(7), [phone].   -Fleeta cotton [place], J.J.M.F, Orman ROES., Oneita MERYL Sebastian DELORSE. (1999). Measuring the change in disability after inpatient rehabilitation; comparison of the  responsiveness of the Barthel Index and Functional Independence Measure. Journal of Neurology, Neurosurgery, and Psychiatry, 66(4), [phone].  Marea Chillington, N.J.A, Scholte op [place],  W.J.M, & Koopmanschap, M.A. (2004) Assessment of post-stroke quality of life in cost-effectiveness studies: The usefulness of the Barthel Index and the EuroQoL-5D. Quality of Life Research, 64, 572-56           Physical Therapy Evaluation Charge Determination   History Examination Presentation Decision-Making   MEDIUM  Complexity : 1-2 comorbidities / personal factors will impact the outcome/ POC  LOW Complexity : 1-2 Standardized tests and measures addressing body structure, function, activity limitation and / or participation in recreation  LOW Complexity : Stable, uncomplicated  LOW Complexity : FOTO score of 75-100      Based on the above components, the patient evaluation is determined to be of the following complexity level: LOW     Pain Rating:  Did not report pain    Activity Tolerance:   Fair, desaturates with exertion and requires oxygen, and requires rest breaks    After treatment patient left in no apparent distress:   Sitting in chair, Heels elevated for pressure relief, Call bell within reach, and Bed / chair alarm activated    COMMUNICATION/EDUCATION:   The patient's plan of care was discussed with: Occupational therapist and Registered nurse.     Fall prevention education was provided and the patient/caregiver indicated understanding.    Thank you for this referral.  Katelyn Stclair, PT, DPT, NCS   Time Calculation: 30 mins

## 2020-11-19 NOTE — Progress Notes (Signed)
0700: Bedside and Verbal shift change report given to [employer] (Cabin crew) by Sandria Manly (offgoing nurse). Report included the following information SBAR, Kardex, Intake/Output, MAR, and Recent Results.      0915: Incentive spirometer given to patient and educated on how to use it. Patient able to use it independently     1520: Respiratory panel sent to the lab for processing.     1900 End of Shift Note    Bedside shift change report given to Sarah,RN (oncoming nurse) by Jerral Bonito, RN (offgoing nurse).  Report included the following information SBAR, Kardex, Intake/Output, MAR, and Recent Results    Shift worked:  7a-7p     Shift summary and any significant changes:     Patient being ruled out for Covid-19, respiratory panel sent to the lab, pending results. Patient up to the chair and bathroom this shift. PRN morphine given twice this shift.      Concerns for physician to address:        Zone phone for oncoming shift:           Activity:  Activity Level: Up with Assistance  Number times ambulated in hallways past shift: 0  Number of times OOB to chair past shift: 4    Cardiac:   Cardiac Monitoring: Yes      Cardiac Rhythm: Sinus Rhythm    Access:  Current line(s): PIV     Genitourinary:   Urinary status: voiding    Respiratory:   O2 Device: Nasal cannula  Chronic home O2 use?: NO  Incentive spirometer at bedside: YES       GI:  Last Bowel Movement Date: 11/18/20  Current diet:  ADULT DIET Regular; Low Fat/Low Chol/High Fiber/NAS  Passing flatus: YES  Tolerating current diet: YES       Pain Management:   Patient states pain is manageable on current regimen: YES    Skin:  Braden Score: 22  Interventions: increase time out of bed and nutritional support     Patient Safety:  Fall Score: Total Score: 4  Interventions: bed/chair alarm, assistive device (walker, cane, etc), gripper socks, pt to call before getting OOB, and stay with me (per policy)  High Fall Risk: Yes    Length of Stay:  Expected LOS: 2d  12h  Actual LOS: 2      Jerral Bonito, RN

## 2020-11-19 NOTE — Progress Notes (Deleted)
0700: Bedside and Verbal shift change report given to Emily, SN (oncoming nurse) by Lesha, RN (offgoing nurse). Report included the following information SBAR, Kardex, Intake/Output, MAR, and Recent Results.

## 2020-11-19 NOTE — Progress Notes (Signed)
1900 Bedside and Verbal shift change report given to [facility] RN  (oncoming nurse) by Apolinar Junes RN  (offgoing nurse). Report included the following information SBAR, Kardex, Intake/Output, MAR, Accordion, and Cardiac Rhythm NSR .     2000: Pt visitor at bedside.     End of Shift Note    Bedside shift change report given to [facility] RN (oncoming nurse) by Babette Relic, RN (offgoing nurse).  Report included the following information SBAR, Kardex, Intake/Output, MAR, Accordion, and Cardiac Rhythm nsr    Shift worked:  night     Shift summary and any significant changes:     *PRN MORPHINE AND TYLENOL GIVEN OVERNIGHT D/T PAIN IN CHEST AND BACK.   *PT WOULD LIKE FLU VACCINE WITH D/C      Concerns for physician to address: PAIN CONTROL     Zone phone for oncoming shift:   XXXX       Activity:  Activity Level: Up with Assistance  Number times ambulated in hallways past shift: 0  Number of times OOB to chair past shift: 0    Cardiac:   Cardiac Monitoring: Yes      Cardiac Rhythm: Sinus Rhythm    Access:  Current line(s): PIV     Genitourinary:   Urinary status: voiding    Respiratory:   O2 Device: Nasal cannula  Chronic home O2 use?: NO  Incentive spirometer at bedside: NO       GI:  Last Bowel Movement Date: 11/18/20  Current diet:  ADULT DIET Regular; Low Fat/Low Chol/High Fiber/NAS  Passing flatus: YES  Tolerating current diet: YES       Pain Management:   Patient states pain is manageable on current regimen: YES    Skin:  Braden Score: 22  Interventions: increase time out of bed    Patient Safety:  Fall Score: Total Score: 4  Interventions: gripper socks and pt to call before getting OOB  High Fall Risk: Yes    Length of Stay:  Expected LOS: - - -  Actual LOS: 2      Babette Relic, RN

## 2020-11-20 ENCOUNTER — Inpatient Hospital Stay: Admit: 2020-11-20 | Payer: BLUE CROSS/BLUE SHIELD | Primary: Primary Care

## 2020-11-20 LAB — RESPIRATORY VIRUS PANEL W/COVID-19, PCR
Adenovirus: NOT DETECTED
Adenovirus: NOT DETECTED
B. parapertussis, PCR: NOT DETECTED
BORDETELLA PARAPERTUSSIS PCR, BORPAR: NOT DETECTED
Bordetella pertussis - PCR: NOT DETECTED
Bordetella pertussis by PCR: NOT DETECTED
CORONAVIRUS 229E: NOT DETECTED
CORONAVIRUS HKU1: NOT DETECTED
CORONAVIRUS NL63: NOT DETECTED
CORONAVIRUS OC43: NOT DETECTED
Chlamydophila pneumoniae DNA, QL, PCR: NOT DETECTED
Chlamydophilia pneumoniae by PCR: NOT DETECTED
Coronavirus 229E: NOT DETECTED
Coronavirus CVNL63: NOT DETECTED
Coronavirus HKU1: NOT DETECTED
Coronavirus OC43: NOT DETECTED
H1N1 by PCR: NOT DETECTED
INFLUENZA A H1: NOT DETECTED
INFLUENZA A H1N1 PCR: NOT DETECTED
INFLUENZA A: NOT DETECTED
INFLUENZA B: NOT DETECTED
Influenza A H3: NOT DETECTED
Influenza A, subtype H1: NOT DETECTED
Influenza A, subtype H3: NOT DETECTED
Influenza A: NOT DETECTED
Influenza B: NOT DETECTED
Metapneumovirus: NOT DETECTED
Metapneumovirus: NOT DETECTED
Mycoplasma pneumoniae DNA, QL, PCR: NOT DETECTED
Mycoplasma pneumoniae by PCR: NOT DETECTED
PARAINFLUENZA 4: NOT DETECTED
Parainfluenza 1: NOT DETECTED
Parainfluenza 1: NOT DETECTED
Parainfluenza 2: NOT DETECTED
Parainfluenza 2: NOT DETECTED
Parainfluenza 3: NOT DETECTED
Parainfluenza 3: NOT DETECTED
Parainfluenza virus 4: NOT DETECTED
RSV by PCR: DETECTED — CR
RSV by PCR: DETECTED — CR
Rhinovirus Enterovirus PCR: NOT DETECTED
Rhinovirus and Enterovirus: NOT DETECTED
SARS Coronavirus-2: NOT DETECTED
SARS-CoV-2, PCR: NOT DETECTED

## 2020-11-20 LAB — POC G3 - PUL
Allens test (POC): POSITIVE
Base Excess, Arterial: 0.9 mmol/L
Base excess (POC): 0.9 mmol/L
FIO2 (POC): 100 %
FIO2: 100 %
HCO3 (POC): 25.3 MMOL/L (ref 22–26)
HCO3, Art: 25.3 MMOL/L (ref 22–26)
POC Allen's Test: POSITIVE
POC O2 SAT: 88 % — ABNORMAL LOW (ref 92–97)
pCO2 (POC): 38.7 MMHG (ref 35.0–45.0)
pCO2, Art: 38.7 MMHG (ref 35.0–45.0)
pH (POC): 7.42 (ref 7.35–7.45)
pH, Art: 7.42 (ref 7.35–7.45)
pO2 (POC): 53 MMHG — ABNORMAL LOW (ref 80–100)
pO2, Art: 53 MMHG — ABNORMAL LOW (ref 80–100)
sO2 (POC): 88 % — ABNORMAL LOW (ref 92–97)

## 2020-11-20 LAB — EKG 12-LEAD
Atrial Rate: 88 {beats}/min
Diagnosis: NORMAL
P Axis: 58 degrees
P-R Interval: 146 ms
Q-T Interval: 384 ms
QRS Duration: 74 ms
QTc Calculation (Bazett): 464 ms
R Axis: 66 degrees
T Axis: 60 degrees
Ventricular Rate: 88 {beats}/min

## 2020-11-20 LAB — CBC
Hematocrit: 42.1 % (ref 35.0–47.0)
Hemoglobin: 14.6 g/dL (ref 11.5–16.0)
MCH: 32.7 PG (ref 26.0–34.0)
MCHC: 34.7 g/dL (ref 30.0–36.5)
MCV: 94.4 FL (ref 80.0–99.0)
MPV: 10.6 FL (ref 8.9–12.9)
NRBC Absolute: 0 10*3/uL (ref 0.00–0.01)
Nucleated RBCs: 0 PER 100 WBC
Platelets: 204 10*3/uL (ref 150–400)
RBC: 4.46 M/uL (ref 3.80–5.20)
RDW: 12.5 % (ref 11.5–14.5)
WBC: 14.7 10*3/uL — ABNORMAL HIGH (ref 3.6–11.0)

## 2020-11-20 LAB — TROPONIN, HIGH SENSITIVITY: Troponin, High Sensitivity: <4 ng/L (ref 0–51)

## 2020-11-20 LAB — BASIC METABOLIC PANEL
Anion Gap: 6 mmol/L (ref 5–15)
BUN: 5 MG/DL — ABNORMAL LOW (ref 6–20)
Bun/Cre Ratio: 7 — ABNORMAL LOW (ref 12–20)
CO2: 27 mmol/L (ref 21–32)
Calcium: 9 MG/DL (ref 8.5–10.1)
Chloride: 103 mmol/L (ref 97–108)
Creatinine: 0.73 MG/DL (ref 0.55–1.02)
ESTIMATED GLOMERULAR FILTRATION RATE: >60 mL/min/{1.73_m2} (ref >60)
Glucose: 117 mg/dL — ABNORMAL HIGH (ref 65–100)
Potassium: 4 mmol/L (ref 3.5–5.1)
Sodium: 136 mmol/L (ref 136–145)

## 2020-11-20 LAB — LACTIC ACID
Lactic Acid: 1.2 MMOL/L (ref 0.4–2.0)
Lactic acid: 1.2 MMOL/L (ref 0.4–2.0)

## 2020-11-20 LAB — METABOLIC PANEL, BASIC
Anion gap: 6 mmol/L (ref 5–15)
BUN/Creatinine ratio: 7 — ABNORMAL LOW (ref 12–20)
BUN: 5 MG/DL — ABNORMAL LOW (ref 6–20)
CO2: 27 mmol/L (ref 21–32)
Calcium: 9 MG/DL (ref 8.5–10.1)
Chloride: 103 mmol/L (ref 97–108)
Creatinine: 0.73 MG/DL (ref 0.55–1.02)
Glucose: 117 mg/dL — ABNORMAL HIGH (ref 65–100)
Potassium: 4 mmol/L (ref 3.5–5.1)
Sodium: 136 mmol/L (ref 136–145)
eGFR: >60 mL/min/{1.73_m2} (ref >60)

## 2020-11-20 LAB — CBC W/O DIFF
ABSOLUTE NRBC: 0 10*3/uL (ref 0.00–0.01)
HCT: 42.1 % (ref 35.0–47.0)
HGB: 14.6 g/dL (ref 11.5–16.0)
MCH: 32.7 PG (ref 26.0–34.0)
MCHC: 34.7 g/dL (ref 30.0–36.5)
MCV: 94.4 FL (ref 80.0–99.0)
MPV: 10.6 FL (ref 8.9–12.9)
NRBC: 0 PER 100 WBC
PLATELET: 204 10*3/uL (ref 150–400)
RBC: 4.46 M/uL (ref 3.80–5.20)
RDW: 12.5 % (ref 11.5–14.5)
WBC: 14.7 10*3/uL — ABNORMAL HIGH (ref 3.6–11.0)

## 2020-11-20 LAB — EKG, 12 LEAD, INITIAL
Atrial Rate: 88 {beats}/min
Calculated P Axis: 58 degrees
Calculated R Axis: 66 degrees
Calculated T Axis: 60 degrees
Diagnosis: NORMAL
P-R Interval: 146 ms
Q-T Interval: 384 ms
QRS Duration: 74 ms
QTC Calculation (Bezet): 464 ms
Ventricular Rate: 88 {beats}/min

## 2020-11-20 LAB — TROPONIN-HIGH SENSITIVITY: Troponin-High Sensitivity: <4 ng/L (ref 0–51)

## 2020-11-20 MED ORDER — MORPHINE 2 MG/ML INJECTION
2 mg/mL | INTRAMUSCULAR | Status: AC | PRN
Start: 2020-11-20 — End: 2020-11-24

## 2020-11-20 MED ORDER — IPRATROPIUM-ALBUTEROL 2.5 MG-0.5 MG/3 ML NEB SOLUTION
2.5 mg-0.5 mg/3 ml | Freq: Four times a day (QID) | RESPIRATORY_TRACT | Status: AC
Start: 2020-11-20 — End: 2020-11-22
  Administered 2020-11-21 – 2020-11-22 (×7): via RESPIRATORY_TRACT

## 2020-11-20 MED ORDER — ACETYLCYSTEINE 20 % (200 MG/ML) SOLN
200 mg/mL (20 %) | Freq: Three times a day (TID) | Status: DC | PRN
Start: 2020-11-20 — End: 2020-11-20

## 2020-11-20 MED ORDER — METHYLPREDNISOLONE (PF) 40 MG/ML IJ SOLR
40 mg/mL | Freq: Four times a day (QID) | INTRAMUSCULAR | Status: DC
Start: 2020-11-20 — End: 2020-11-20
  Administered 2020-11-20: 13:00:00 via INTRAVENOUS

## 2020-11-20 MED ORDER — IBUPROFEN 600 MG TAB
600 mg | Freq: Once | ORAL | Status: AC
Start: 2020-11-20 — End: 2020-11-20
  Administered 2020-11-20: 06:00:00 via ORAL

## 2020-11-20 MED ORDER — IPRATROPIUM-ALBUTEROL 2.5 MG-0.5 MG/3 ML NEB SOLUTION
2.5 mg-0.5 mg/3 ml | RESPIRATORY_TRACT | Status: DC | PRN
Start: 2020-11-20 — End: 2020-11-20
  Administered 2020-11-20 (×2): via RESPIRATORY_TRACT

## 2020-11-20 MED ORDER — IPRATROPIUM-ALBUTEROL 2.5 MG-0.5 MG/3 ML NEB SOLUTION
2.5 mg-0.5 mg/3 ml | RESPIRATORY_TRACT | Status: DC | PRN
Start: 2020-11-20 — End: 2020-11-20
  Administered 2020-11-20: 16:00:00 via RESPIRATORY_TRACT

## 2020-11-20 MED ORDER — DIAZEPAM 5 MG/ML SYRINGE
5 mg/mL | Freq: Four times a day (QID) | INTRAMUSCULAR | Status: AC | PRN
Start: 2020-11-20 — End: 2020-11-24
  Administered 2020-11-21 – 2020-11-24 (×14): via INTRAVENOUS

## 2020-11-20 MED ORDER — HYDROMORPHONE 1 MG/ML INJECTION SOLUTION
1 mg/mL | Freq: Once | INTRAMUSCULAR | Status: AC
Start: 2020-11-20 — End: 2020-11-20
  Administered 2020-11-20: 10:00:00 via INTRAVENOUS

## 2020-11-20 MED ORDER — LIDOCAINE 4 % TOPICAL PATCH (12 HOUR DURATION)
4 % | CUTANEOUS | Status: DC
Start: 2020-11-20 — End: 2020-11-20

## 2020-11-20 MED ORDER — ACETYLCYSTEINE 20 % (200 MG/ML) SOLN
200 mg/mL (20 %) | Freq: Three times a day (TID) | Status: AC | PRN
Start: 2020-11-20 — End: 2020-11-24
  Administered 2020-11-20: 08:00:00 via RESPIRATORY_TRACT

## 2020-11-20 MED ORDER — DIPHENHYDRAMINE HCL 50 MG/ML IJ SOLN
50 mg/mL | INTRAMUSCULAR | Status: AC
Start: 2020-11-20 — End: 2020-11-20
  Administered 2020-11-20: 10:00:00 via INTRAVENOUS

## 2020-11-20 MED ORDER — IOPAMIDOL 76 % IV SOLN
37076 mg iodine /mL (76 %) | Freq: Once | INTRAVENOUS | Status: AC
Start: 2020-11-20 — End: 2020-11-20
  Administered 2020-11-20: 12:00:00 via INTRAVENOUS

## 2020-11-20 MED ORDER — GUAIFENESIN 600 MG TABLET,EXTENDED RELEASE BIPHASIC 12 HR
600 mg | Freq: Two times a day (BID) | ORAL | Status: DC
Start: 2020-11-20 — End: 2020-11-24
  Administered 2020-11-20 – 2020-11-24 (×10): via ORAL

## 2020-11-20 MED ORDER — METHYLPREDNISOLONE (PF) 125 MG/2 ML IJ SOLR
125 mg/2 mL | INTRAMUSCULAR | Status: AC
Start: 2020-11-20 — End: 2020-11-20
  Administered 2020-11-20: 07:00:00 via INTRAVENOUS

## 2020-11-20 MED ORDER — FUROSEMIDE 10 MG/ML IJ SOLN
10 mg/mL | Freq: Once | INTRAMUSCULAR | Status: AC
Start: 2020-11-20 — End: 2020-11-20
  Administered 2020-11-20: 08:00:00 via INTRAVENOUS

## 2020-11-20 MED ORDER — HYDROMORPHONE 1 MG/ML INJECTION SOLUTION
1 mg/mL | INTRAMUSCULAR | Status: AC | PRN
Start: 2020-11-20 — End: 2020-11-22
  Administered 2020-11-20 – 2020-11-22 (×11): via INTRAVENOUS

## 2020-11-20 MED ORDER — METHYLPREDNISOLONE (PF) 125 MG/2 ML IJ SOLR
125 mg/2 mL | Freq: Once | INTRAMUSCULAR | Status: AC
Start: 2020-11-20 — End: 2020-11-20

## 2020-11-20 MED ORDER — CLONAZEPAM 0.5 MG TAB
0.5 mg | Freq: Once | ORAL | Status: AC
Start: 2020-11-20 — End: 2020-11-19
  Administered 2020-11-20: 01:00:00 via ORAL

## 2020-11-20 MED ORDER — DIAZEPAM 5 MG/ML SYRINGE
5 mg/mL | Freq: Once | INTRAMUSCULAR | Status: AC
Start: 2020-11-20 — End: 2020-11-20
  Administered 2020-11-20: 07:00:00 via INTRAVENOUS

## 2020-11-20 MED ORDER — CYCLOBENZAPRINE 10 MG TAB
10 mg | Freq: Three times a day (TID) | ORAL | Status: AC | PRN
Start: 2020-11-20 — End: 2020-11-24
  Administered 2020-11-20 – 2020-11-24 (×9): via ORAL

## 2020-11-20 MED ORDER — METHYLPREDNISOLONE (PF) 40 MG/ML IJ SOLR
40 mg/mL | Freq: Four times a day (QID) | INTRAMUSCULAR | Status: AC
Start: 2020-11-20 — End: 2020-11-24
  Administered 2020-11-20 – 2020-11-24 (×16): via INTRAVENOUS

## 2020-11-20 MED ORDER — FENTANYL CITRATE (PF) 50 MCG/ML IJ SOLN
50 mcg/mL | Freq: Once | INTRAMUSCULAR | Status: AC
Start: 2020-11-20 — End: 2020-11-20
  Administered 2020-11-20: 07:00:00 via INTRAVENOUS

## 2020-11-20 MED ORDER — LIDOCAINE 4 % TOPICAL PATCH (12 HOUR DURATION)
4 % | Freq: Every day | CUTANEOUS | Status: DC
Start: 2020-11-20 — End: 2020-11-24

## 2020-11-20 MED FILL — AZITHROMYCIN 500 MG IV SOLUTION: 500 mg | INTRAVENOUS | Qty: 5

## 2020-11-20 MED FILL — SOLU-MEDROL (PF) 40 MG/ML SOLUTION FOR INJECTION: 40 mg/mL | INTRAMUSCULAR | Qty: 2

## 2020-11-20 MED FILL — MORPHINE 2 MG/ML INJECTION: 2 mg/mL | INTRAMUSCULAR | Qty: 1

## 2020-11-20 MED FILL — MUCINEX 600 MG TABLET, EXTENDED RELEASE: 600 mg | ORAL | Qty: 1

## 2020-11-20 MED FILL — CYCLOBENZAPRINE 10 MG TAB: 10 mg | ORAL | Qty: 1

## 2020-11-20 MED FILL — ISOVUE-370  76 % INTRAVENOUS SOLUTION: 370 mg iodine /mL (76 %) | INTRAVENOUS | Qty: 100

## 2020-11-20 MED FILL — CLONAZEPAM 0.5 MG TAB: 0.5 mg | ORAL | Qty: 1

## 2020-11-20 MED FILL — LIPITOR 20 MG TABLET: 20 mg | ORAL | Qty: 1

## 2020-11-20 MED FILL — SALONPAS (LIDOCAINE) 4 % TOPICAL PATCH: 4 % | CUTANEOUS | Qty: 1

## 2020-11-20 MED FILL — RISAQUAD 8 BILLION CELL CAPSULE: 8 billion cell | ORAL | Qty: 1

## 2020-11-20 MED FILL — DIPHENHYDRAMINE HCL 50 MG/ML IJ SOLN: 50 mg/mL | INTRAMUSCULAR | Qty: 1

## 2020-11-20 MED FILL — HYDROMORPHONE (PF) 1 MG/ML IJ SOLN: 1 mg/mL | INTRAMUSCULAR | Qty: 1

## 2020-11-20 MED FILL — FENTANYL CITRATE (PF) 50 MCG/ML IJ SOLN: 50 mcg/mL | INTRAMUSCULAR | Qty: 2

## 2020-11-20 MED FILL — FUROSEMIDE 10 MG/ML IJ SOLN: 10 mg/mL | INTRAMUSCULAR | Qty: 4

## 2020-11-20 MED FILL — ENOXAPARIN 40 MG/0.4 ML SUB-Q SYRINGE: 40 mg/0.4 mL | SUBCUTANEOUS | Qty: 0.4

## 2020-11-20 MED FILL — IPRATROPIUM-ALBUTEROL 2.5 MG-0.5 MG/3 ML NEB SOLUTION: 2.5 mg-0.5 mg/3 ml | RESPIRATORY_TRACT | Qty: 3

## 2020-11-20 MED FILL — CEFTRIAXONE 1 GRAM SOLUTION FOR INJECTION: 1 gram | INTRAMUSCULAR | Qty: 1

## 2020-11-20 MED FILL — DIAZEPAM 5 MG/ML SYRINGE: 5 mg/mL | INTRAMUSCULAR | Qty: 2

## 2020-11-20 MED FILL — POTASSIUM CHLORIDE SR 20 MEQ TAB, PARTICLES/CRYSTALS: 20 mEq | ORAL | Qty: 1

## 2020-11-20 MED FILL — MAPAP (ACETAMINOPHEN) 325 MG TABLET: 325 mg | ORAL | Qty: 2

## 2020-11-20 MED FILL — IBUPROFEN 600 MG TAB: 600 mg | ORAL | Qty: 1

## 2020-11-20 MED FILL — SOLU-MEDROL (PF) 125 MG/2 ML SOLUTION FOR INJECTION: 125 mg/2 mL | INTRAMUSCULAR | Qty: 2

## 2020-11-20 NOTE — Progress Notes (Signed)
Received notification from bedside RN about patient with regards to: sever back pain not adequately relieved by Morphine , requesting medication for relief  VS: BP 107/77, HR 78, RR 25, O2 sat 95% on NC 2 L    Intervention given: Motrin 600 mg PO x 1 dose, Flexeril PO PRN, Lidocaine patch daily to start now, heating pad ordered    0236: RRT: For acute respiratory distress, audible wheezing  with component of increasing anxiety and uncontrolled back pain extending to her chest    - CXR: Interval increase in bilateral pulmonary infiltrates.  - ABG: pH 7.42, pCO2 39.7, pO2 53, sO2   - Fentanyl 50 mcg IV x 1 dose  - Solumedrol 60 mg IV now  - Duoneb PRN  - Face tent with humidification PRN ordered    0259: Dr. Nolon Bussing asked to come at bedside to assess patient as symptom is not getting better, ordered the following  - Diazepam 10 mg IV now and PRN  - BIPAP PRN  - Mucomyst TID PRN  - Benadryl 25 mg IV x 1  - Lasix 40 mg IV x 1  - Solumedrol 60 mg IV x 2 more doses  - Morphine switched to Dilaudid IV PRN  - CT chest and abdomen   - Troponin, BMP, CBC,  lactic, HIV panel    Critical care time: 70 minutes

## 2020-11-20 NOTE — Progress Notes (Signed)
Pt called out moaning in pain, stating the morphine doses were not adequately controlling her back pain. When asked if it was more pain or anxiety related, she stated it was both. Reached out to hospitalist NP about getting additional orders.    0240: paged NP to bedside, pt on all fours in bed, red in the face and rocking back and forth in pain. Pt had taken nasal cannula off and sats 86-91%. O2 replaced. Pt passed out with this nurse and family at bedside. Placed face mask, and returned pt to supine position sitting up in bed. Pt's eyes rolled in the back of her head, tongue hanging out. Asked NP for ordered. Orders received for one time dose Fentanyl and breathing treatment.    Family asking for MD at bedside.    0300: MD at bedside. Orders received for valium IV and mucomyst neb. RT at bedside, placed on face mask 40%, pts sats 86%. Increased O2, Xray and ABG ordered. One time dose solumedrol given.    0330: Pt placed on non-rebreather. Pt passed out again, but maintained airway and sats 95% on non-rebreather 15L. Came to after about 15 seconds.  ABG resulted relayed to MD.     0430: EKG completed. Pt requested lidocaine patch be removed     0540:Pt ambulated independently to bedside commode, voided and returned to bed. Benadryl and Dilaudid given.    0600: Pt states she feels groggy but is sitting up in bed appearing more relaxed than previously seen    0700: Report given to Raynelle Fanning, [place]

## 2020-11-20 NOTE — Progress Notes (Signed)
Problem: Falls - Risk of  Goal: *Absence of Falls  Description: Document Christina Stevens Fall Risk and appropriate interventions in the flowsheet.  Outcome: Progressing Towards Goal  Note: Fall Risk Interventions:  Mobility Interventions: PT Consult for mobility concerns, PT Consult for assist device competence, Strengthening exercises (ROM-active/passive), Bed/chair exit alarm         Medication Interventions: Bed/chair exit alarm, Teach patient to arise slowly, Patient to call before getting OOB    Elimination Interventions: Bed/chair exit alarm, Call light in reach, Toileting schedule/hourly rounds, Stay With Me (per policy)    History of Falls Interventions: Room close to nurse's station, Bed/chair exit alarm, Door open when patient unattended         Problem: Patient Education: Go to Patient Education Activity  Goal: Patient/Family Education  Outcome: Progressing Towards Goal     Problem: Pain  Goal: *Control of Pain  Outcome: Progressing Towards Goal  Goal: *PALLIATIVE CARE:  Alleviation of Pain  Outcome: Progressing Towards Goal     Problem: Patient Education: Go to Patient Education Activity  Goal: Patient/Family Education  Outcome: Progressing Towards Goal     Problem: Patient Education: Go to Patient Education Activity  Goal: Patient/Family Education  Outcome: Progressing Towards Goal     Problem: Patient Education: Go to Patient Education Activity  Goal: Patient/Family Education  Outcome: Progressing Towards Goal     Problem: Pneumonia: Day 1  Goal: Off Pathway (Use only if patient is Off Pathway)  Outcome: Progressing Towards Goal  Goal: Activity/Safety  Outcome: Progressing Towards Goal  Goal: Consults, if ordered  Outcome: Progressing Towards Goal  Goal: Diagnostic Test/Procedures  Outcome: Progressing Towards Goal  Goal: Nutrition/Diet  Outcome: Progressing Towards Goal  Goal: Medications  Outcome: Progressing Towards Goal  Goal: Respiratory  Outcome: Progressing Towards Goal  Goal:  Treatments/Interventions/Procedures  Outcome: Progressing Towards Goal  Goal: Psychosocial  Outcome: Progressing Towards Goal  Goal: *Oxygen saturation within defined limits  Outcome: Progressing Towards Goal  Goal: *Influenza vaccine administered (October-March)  Outcome: Progressing Towards Goal  Goal: *Pneumoccocal vaccine administered  Outcome: Progressing Towards Goal  Goal: *Hemodynamically stable  Outcome: Progressing Towards Goal  Goal: *Demonstrates progressive activity  Outcome: Progressing Towards Goal  Goal: *Tolerating diet  Outcome: Progressing Towards Goal     Problem: Pneumonia: Day 2  Goal: Off Pathway (Use only if patient is Off Pathway)  Outcome: Progressing Towards Goal  Goal: Activity/Safety  Outcome: Progressing Towards Goal  Goal: Consults, if ordered  Outcome: Progressing Towards Goal  Goal: Diagnostic Test/Procedures  Outcome: Progressing Towards Goal  Goal: Nutrition/Diet  Outcome: Progressing Towards Goal  Goal: Discharge Planning  Outcome: Progressing Towards Goal  Goal: Medications  Outcome: Progressing Towards Goal  Goal: Respiratory  Outcome: Progressing Towards Goal  Goal: Treatments/Interventions/Procedures  Outcome: Progressing Towards Goal  Goal: Psychosocial  Outcome: Progressing Towards Goal  Goal: *Oxygen saturation within defined limits  Outcome: Progressing Towards Goal  Goal: *Hemodynamically stable  Outcome: Progressing Towards Goal  Goal: *Demonstrates progressive activity  Outcome: Progressing Towards Goal  Goal: *Tolerating diet  Outcome: Progressing Towards Goal  Goal: *Optimal pain control at patient's stated goal  Outcome: Progressing Towards Goal     Problem: Pneumonia: Day 3  Goal: Off Pathway (Use only if patient is Off Pathway)  Outcome: Progressing Towards Goal  Goal: Activity/Safety  Outcome: Progressing Towards Goal  Goal: Consults, if ordered  Outcome: Progressing Towards Goal  Goal: Diagnostic Test/Procedures  Outcome: Progressing Towards Goal  Goal:  Nutrition/Diet  Outcome: Progressing Towards Goal  Goal: Discharge Planning  Outcome: Progressing Towards Goal  Goal: Medications  Outcome: Progressing Towards Goal  Goal: Respiratory  Outcome: Progressing Towards Goal  Goal: Treatments/Interventions/Procedures  Outcome: Progressing Towards Goal  Goal: Psychosocial  Outcome: Progressing Towards Goal  Goal: *Oxygen saturation within defined limits  Outcome: Progressing Towards Goal  Goal: *Hemodynamically stable  Outcome: Progressing Towards Goal  Goal: *Demonstrates progressive activity  Outcome: Progressing Towards Goal  Goal: *Tolerating diet  Outcome: Progressing Towards Goal  Goal: *Describes available resources and support systems  Outcome: Progressing Towards Goal  Goal: *Optimal pain control at patient's stated goal  Outcome: Progressing Towards Goal     Problem: Pneumonia: Day 4  Goal: Off Pathway (Use only if patient is Off Pathway)  Outcome: Progressing Towards Goal  Goal: Activity/Safety  Outcome: Progressing Towards Goal  Goal: Nutrition/Diet  Outcome: Progressing Towards Goal  Goal: Discharge Planning  Outcome: Progressing Towards Goal  Goal: Medications  Outcome: Progressing Towards Goal  Goal: Respiratory  Outcome: Progressing Towards Goal  Goal: Treatments/Interventions/Procedures  Outcome: Progressing Towards Goal  Goal: Psychosocial  Outcome: Progressing Towards Goal

## 2020-11-20 NOTE — Progress Notes (Signed)
Agree with NP Salabao's note

## 2020-11-20 NOTE — Progress Notes (Signed)
0800 - pt sent to ct,[place] charge rn asked to go with pt due to O2 demand  0825 -Rapid response rn rounded on pt, updated on pt at bedside  Pt educated on incentive and is using well  Pt family at bedside, pt on 8l

## 2020-11-20 NOTE — Progress Notes (Signed)
Progress  Notes by Avel Sensor, MD at 11/20/20 1520                Author: Avel Sensor, MD  Service: Internal Medicine  Author Type: Physician       Filed: 11/20/20 1523  Date of Service: 11/20/20 1520  Status: Signed          Editor: Avel Sensor, MD (Physician)                       Hospitalist Progress Note      NAME: Christina Stevens    DOB:  11/04/1980    MRN:  [id number]          Assessment / Plan:        Community-acquired pneumonia  RSV induced hypoxic respiratory failure  Chest x-ray  showed bilateral upper lobe infiltrates  Diffuse bilateral consolidation, groundglass opacities was seen on CT spine  Continue ceftriaxone  and azithromycin  Sputum culture, blood culture  Urine strep and Legionella. Rapid covid/ flu negative   Respiratory virus panel positive for RSV   Robitussin as needed  Of the response overnight due to increased oxygen requirement, currently on 10 L high flow from 40 overnight    chest x-ray with morning with increased bilateral infiltrates   Very high risk for deterioration   Will consult pulmonology if no improvement in 24 hours    was given Solu-Medrol 60 mg every 6 hours overnight, will continue for now       Generalized weakness:   Has weakness greater on lower > upper extremity, could be d/t pneumonia/ infection   CT thoracic and lumbar spine show no significant spinal stenosis   Physical therapy evaluation   Pain control with morphine         ADHD  Continue with Adderal    Hypercholesterolemia   Cw statin       PCOS:  Takes medical marijuana at home       Active smoker:   Counseling was provided about smoke cessation  Patient report that she will not smoke anymore         30.0 - 39.9 Obese /  Body mass index is 31.74 kg/m??.      Estimated discharge date: October 22   Barriers: Clinical improvement      Code status: Full   Prophylaxis: Lovenox   Recommended Disposition: Home w/Family          Subjective:        Patient was seen and examined.   Rapid response team was  called overnight due to increased respiratory distress.      Discussed with RN overnight events.      All patient's questions were answered.      "Feeling better than last night"      Review of Systems:           Symptom  Y/N  Comments    Symptom  Y/N  Comments             Fever/Chills  n      Chest Pain  n               Poor Appetite        Edema         Cough  n      Abdominal Pain  n               Sputum  Joint Pain         SOB/DOE  y      Pruritis/Rash         Nausea/vomit  n      Tolerating PT/OT         Diarrhea        Tolerating Diet  y               Constipation        Other               Could NOT obtain due to:                  Objective:        VITALS:    Last 24hrs VS reviewed since prior progress note. Most recent are:   Patient Vitals for the past 24 hrs:            Temp  Pulse  Resp  BP  SpO2            11/20/20 1436  --  --  --  --  94 %            11/20/20 1200  --  --  --  --  95 %     11/20/20 1154  --  --  --  --  96 %     11/20/20 0805  98.8 ??F (37.1 ??C)  83  22  120/89  97 %     11/20/20 0747  --  --  --  --  97 %     11/20/20 0604  --  --  --  --  93 %     11/20/20 0600  --  --  --  --  100 %     11/20/20 0404  --  --  --  --  93 %     11/20/20 0334  97.7 ??F (36.5 ??C)  82  27  119/86  95 %     11/20/20 0253  --  --  --  --  91 %     11/19/20 2326  98.8 ??F (37.1 ??C)  78  25  107/77  95 %            11/19/20 1928  97.5 ??F (36.4 ??C)  80  18  120/89  95 %              Intake/Output Summary (Last 24 hours) at 11/20/2020 1520   Last data filed at 11/20/2020 0539     Gross per 24 hour        Intake  --        Output  1100 ml        Net  -1100 ml               I had a face to face encounter and independently examined this patient on 11/20/2020, as outlined below:   PHYSICAL EXAM:   General: WD, WN. Alert, cooperative, no acute distress     EENT:  EOMI. Anicteric sclerae. MMM   Resp:  CTA bilaterally, no wheezing but bilateral rales. no accessory muscle use   CV:  Regular  rhythm,  No edema    GI:  Soft, Non distended, Non tender.  +Bowel sounds   Neurologic:  EOMs intact. No facial asymmetry. No aphasia or slurred speech. Symmetrical strength, Sensation grossly intact. Alert and oriented X 4.       Psych:  Good insight. Not anxious nor agitated   Skin:  No rashes.  No jaundice      Reviewed most current lab test results and cultures  YES   Reviewed most current radiology test results   YES   Review and summation of old records today    NO   Reviewed patient's current orders and MAR    YES   PMH/SH reviewed - no change compared to H&P   ________________________________________________________________________   Care Plan discussed with:           Comments         Patient  y           Family              RN  y       Research scientist (physical sciences)                                  Multidiciplinary team rounds were held today with case manager, nursing, pharmacist and Higher education careers adviser.  Patient's plan of care was discussed; medications  were reviewed and discharge planning was addressed.                     Comments         >50% of visit spent in counseling and coordination of care         ________________________________________________________________________   Avel Sensor, MD       Procedures: see electronic medical records for all procedures/Xrays and details which were not copied into this note but were reviewed prior to creation of Plan.        LABS:   I reviewed today's most current labs and imaging studies.   Pertinent labs include:     Recent Labs            11/20/20   0451  11/18/20   0301     WBC  14.7*  9.3     HGB  14.6  14.3     HCT  42.1  42.4         PLT  204  201             Recent Labs            11/20/20   0451  11/18/20   0301     NA  136  140     K  4.0  3.9     CL  103  106     CO2  27  27     GLU  117*  120*     BUN  5*  4*     CREA  0.73  0.83         CA  9.0  8.3*              Signed: Avel Sensor, MD

## 2020-11-21 LAB — CULTURE, RESPIRATORY/SPUTUM/BRONCH W GRAM STAIN
Culture result:: NORMAL
Culture: NORMAL

## 2020-11-21 MED FILL — AMPHETAMINE-DEXTROAMPHETAMINE 10 MG TAB: 10 mg | ORAL | Qty: 4

## 2020-11-21 MED FILL — IPRATROPIUM-ALBUTEROL 2.5 MG-0.5 MG/3 ML NEB SOLUTION: 2.5 mg-0.5 mg/3 ml | RESPIRATORY_TRACT | Qty: 3

## 2020-11-21 MED FILL — HYDROMORPHONE (PF) 1 MG/ML IJ SOLN: 1 mg/mL | INTRAMUSCULAR | Qty: 1

## 2020-11-21 MED FILL — MUCINEX 600 MG TABLET, EXTENDED RELEASE: 600 mg | ORAL | Qty: 1

## 2020-11-21 MED FILL — CYCLOBENZAPRINE 10 MG TAB: 10 mg | ORAL | Qty: 1

## 2020-11-21 MED FILL — SOLU-MEDROL (PF) 40 MG/ML SOLUTION FOR INJECTION: 40 mg/mL | INTRAMUSCULAR | Qty: 2

## 2020-11-21 MED FILL — POTASSIUM CHLORIDE SR 20 MEQ TAB, PARTICLES/CRYSTALS: 20 mEq | ORAL | Qty: 1

## 2020-11-21 MED FILL — MAPAP (ACETAMINOPHEN) 325 MG TABLET: 325 mg | ORAL | Qty: 2

## 2020-11-21 MED FILL — DIAZEPAM 5 MG/ML SYRINGE: 5 mg/mL | INTRAMUSCULAR | Qty: 2

## 2020-11-21 MED FILL — CEFTRIAXONE 1 GRAM SOLUTION FOR INJECTION: 1 gram | INTRAMUSCULAR | Qty: 1

## 2020-11-21 MED FILL — LIPITOR 20 MG TABLET: 20 mg | ORAL | Qty: 1

## 2020-11-21 MED FILL — AZITHROMYCIN 500 MG IV SOLUTION: 500 mg | INTRAVENOUS | Qty: 5

## 2020-11-21 MED FILL — ENOXAPARIN 40 MG/0.4 ML SUB-Q SYRINGE: 40 mg/0.4 mL | SUBCUTANEOUS | Qty: 0.4

## 2020-11-21 MED FILL — RISAQUAD 8 BILLION CELL CAPSULE: 8 billion cell | ORAL | Qty: 1

## 2020-11-21 MED FILL — NICOTINE 14 MG/24 HR DAILY PATCH: 14 mg/24 hr | TRANSDERMAL | Qty: 1

## 2020-11-21 MED FILL — SALONPAS (LIDOCAINE) 4 % TOPICAL PATCH: 4 % | CUTANEOUS | Qty: 1

## 2020-11-21 NOTE — Progress Notes (Signed)
Problem: Falls - Risk of  Goal: *Absence of Falls  Description: Document Christina Stevens Fall Risk and appropriate interventions in the flowsheet.  11/21/2020 0759 by Wilber Bihari, RN  Outcome: Progressing Towards Goal  Note: Fall Risk Interventions:  Mobility Interventions: PT Consult for mobility concerns, OT consult for ADLs, Assess mobility with egress test, Communicate number of staff needed for ambulation/transfer         Medication Interventions: Bed/chair exit alarm, Patient to call before getting OOB, Teach patient to arise slowly    Elimination Interventions: Bed/chair exit alarm, Call light in reach, Stay With Me (per policy), Toilet paper/wipes in reach    History of Falls Interventions: Bed/chair exit alarm, Investigate reason for fall, Room close to nurse's station      11/21/2020 0759 by Wilber Bihari, RN  Outcome: Progressing Towards Goal  Note: Fall Risk Interventions:  Mobility Interventions: PT Consult for mobility concerns, OT consult for ADLs, Assess mobility with egress test, Communicate number of staff needed for ambulation/transfer         Medication Interventions: Bed/chair exit alarm, Patient to call before getting OOB, Teach patient to arise slowly    Elimination Interventions: Bed/chair exit alarm, Call light in reach, Stay With Me (per policy), Toilet paper/wipes in reach    History of Falls Interventions: Bed/chair exit alarm, Investigate reason for fall, Room close to nurse's station         Problem: Patient Education: Go to Patient Education Activity  Goal: Patient/Family Education  11/21/2020 0759 by Wilber Bihari, RN  Outcome: Progressing Towards Goal  11/21/2020 0759 by Wilber Bihari, RN  Outcome: Progressing Towards Goal     Problem: Pain  Goal: *Control of Pain  11/21/2020 0759 by Wilber Bihari, RN  Outcome: Progressing Towards Goal  11/21/2020 0759 by Wilber Bihari, RN  Outcome: Progressing Towards Goal  Goal: *PALLIATIVE CARE:  Alleviation of Pain  11/21/2020 0759 by  Wilber Bihari, RN  Outcome: Progressing Towards Goal  11/21/2020 0759 by Wilber Bihari, RN  Outcome: Progressing Towards Goal     Problem: Patient Education: Go to Patient Education Activity  Goal: Patient/Family Education  11/21/2020 0759 by Wilber Bihari, RN  Outcome: Progressing Towards Goal  11/21/2020 0759 by Wilber Bihari, RN  Outcome: Progressing Towards Goal     Problem: Patient Education: Go to Patient Education Activity  Goal: Patient/Family Education  11/21/2020 0759 by Wilber Bihari, RN  Outcome: Progressing Towards Goal  11/21/2020 0759 by Wilber Bihari, RN  Outcome: Progressing Towards Goal     Problem: Patient Education: Go to Patient Education Activity  Goal: Patient/Family Education  11/21/2020 0759 by Wilber Bihari, RN  Outcome: Progressing Towards Goal  11/21/2020 0759 by Wilber Bihari, RN  Outcome: Progressing Towards Goal     Problem: Pneumonia: Day 4  Goal: Off Pathway (Use only if patient is Off Pathway)  11/21/2020 0759 by Wilber Bihari, RN  Outcome: Progressing Towards Goal  11/21/2020 0759 by Wilber Bihari, RN  Outcome: Progressing Towards Goal  Goal: Activity/Safety  11/21/2020 0759 by Wilber Bihari, RN  Outcome: Progressing Towards Goal  11/21/2020 0759 by Wilber Bihari, RN  Outcome: Progressing Towards Goal  Goal: Nutrition/Diet  11/21/2020 0759 by Wilber Bihari, RN  Outcome: Progressing Towards Goal  11/21/2020 0759 by Wilber Bihari, RN  Outcome: Progressing Towards Goal  Goal: Discharge Planning  11/21/2020 0759 by Wilber Bihari, RN  Outcome: Progressing Towards Goal  11/21/2020 0759 by Wilber Bihari, RN  Outcome: Progressing Towards Goal  Goal: Medications  11/21/2020 0759 by  Wilber Bihari, RN  Outcome: Progressing Towards Goal  11/21/2020 0759 by Wilber Bihari, RN  Outcome: Progressing Towards Goal  Goal: Respiratory  11/21/2020 0759 by Wilber Bihari, RN  Outcome: Progressing Towards Goal  11/21/2020 0759 by Wilber Bihari, RN  Outcome:  Progressing Towards Goal  Goal: Treatments/Interventions/Procedures  11/21/2020 0759 by Wilber Bihari, RN  Outcome: Progressing Towards Goal  11/21/2020 0759 by Wilber Bihari, RN  Outcome: Progressing Towards Goal  Goal: Psychosocial  11/21/2020 0759 by Wilber Bihari, RN  Outcome: Progressing Towards Goal  11/21/2020 0759 by Wilber Bihari, RN  Outcome: Progressing Towards Goal     Problem: Pneumonia: Discharge Outcomes  Goal: *Demonstrates progressive activity  Outcome: Progressing Towards Goal  Goal: *Describes follow-up/return visits to physicians  Outcome: Progressing Towards Goal  Goal: *Tolerating diet  Outcome: Progressing Towards Goal  Goal: *Verbalizes name, dosage, time, side effects, and number of days to continue medications  Outcome: Progressing Towards Goal  Goal: *Influenza immunization  Outcome: Progressing Towards Goal  Goal: *Pneumococcal immunization  Outcome: Progressing Towards Goal  Goal: *Respiratory status at baseline  Outcome: Progressing Towards Goal  Goal: *Vital signs within defined limits  Outcome: Progressing Towards Goal  Goal: *Describes available resources and support systems  Outcome: Progressing Towards Goal  Goal: *Optimal pain control at patient's stated goal  Outcome: Progressing Towards Goal     Problem: Patient Education: Go to Patient Education Activity  Goal: Patient/Family Education  11/21/2020 0759 by Wilber Bihari, RN  Outcome: Progressing Towards Goal  11/21/2020 0759 by Wilber Bihari, RN  Outcome: Progressing Towards Goal     Problem: Risk for Spread of Infection  Goal: Prevent transmission of infectious organism to others  Description: Prevent the transmission of infectious organisms to other patients, staff members, and visitors.  11/21/2020 0759 by Wilber Bihari, RN  Outcome: Progressing Towards Goal  11/21/2020 0759 by Wilber Bihari, RN  Outcome: Progressing Towards Goal     Problem: Patient Education:  Go to Education Activity  Goal: Patient/Family  Education  11/21/2020 0759 by Wilber Bihari, RN  Outcome: Progressing Towards Goal  11/21/2020 0759 by Wilber Bihari, RN  Outcome: Progressing Towards Goal

## 2020-11-21 NOTE — Progress Notes (Signed)
0700: Bedside and Verbal shift change report given to Wilber Bihari, RN   (oncoming nurse) by [employer] (offgoing nurse). Report included the following information SBAR, Kardex, Intake/Output, MAR, Recent Results, and Cardiac Rhythm NSR .     0907: Pt complaining of pain in her back d/t coughing, rating pain 8/10. PRN dilaudid given.     1100: Pt up in recliner, family at bedside. Pt sating 97% on 8L HFNC. Pt weaned down to 6L HFNC tolerating well. O2 sat now 94%.     1126: Pt complaining of anxiety. PRN Valium given.     1358: Pt complaining of pain in her back d/t coughing, pain 8/10. PRN Dilaudid given. Pt returned to bed.     1900:   End of Shift Note    Bedside shift change report given to Corinna,RN (oncoming nurse) by Wilber Bihari, RN (offgoing nurse).  Report included the following information SBAR, Kardex, Intake/Output, MAR, Recent Results, and Cardiac Rhythm NSR    Shift worked:  [phone]     Shift summary and any significant changes:     See above     Concerns for physician to address:  none     Zone phone for oncoming shift:          Activity:  Activity Level: Up with Assistance  Number times ambulated in hallways past shift: 0  Number of times OOB to chair past shift: 3    Cardiac:   Cardiac Monitoring: Yes      Cardiac Rhythm: Sinus Rhythm    Access:  Current line(s): PIV     Genitourinary:   Urinary status: voiding    Respiratory:   O2 Device: Hi flow nasal cannula  Chronic home O2 use?: NO  Incentive spirometer at bedside: YES       GI:  Last Bowel Movement Date: 11/19/20  Current diet:  ADULT DIET Regular; Low Fat/Low Chol/High Fiber/NAS  Passing flatus: YES  Tolerating current diet: YES       Pain Management:   Patient states pain is manageable on current regimen: YES    Skin:  Braden Score: 22  Interventions: increase time out of bed and limit briefs    Patient Safety:  Fall Score: Total Score: 4  Interventions: bed/chair alarm, assistive device (walker, cane, etc), gripper socks, pt to call  before getting OOB, and stay with me (per policy)  High Fall Risk: Yes    Length of Stay:  Expected LOS: 2d 12h  Actual LOS: 4      Wilber Bihari, RN

## 2020-11-21 NOTE — Progress Notes (Signed)
 Problem: Self Care Deficits Care Plan (Adult)  Goal: *Acute Goals and Plan of Care (Insert Text)  Description: FUNCTIONAL STATUS PRIOR TO ADMISSION: Independent with ADL and IADL. Still working full time as an Airline pilot. Drives.    HOME SUPPORT: The patient lives with her wife but did not require assist.    Occupational Therapy Goals  Initiated 11/19/2020  1.  Patient will perform grooming standing at the sink with modified independence within 7 day(s).  2.  Patient will perform bathing with modified independence within 7 day(s).  3.  Patient will perform lower body dressing with modified independence within 7 day(s).  4.  Patient will perform toilet transfers with modified independence within 7 day(s).  5.  Patient will perform all aspects of toileting with modified independence within 7 day(s).  6.  Patient will independently utilize energy conservation and recommended equipment during ADL within 7 day(s).     Outcome: Progressing Towards Goal   OCCUPATIONAL THERAPY TREATMENT  Patient: Christina Stevens (40 y.o. female)  Date: 11/21/2020  Diagnosis: Community acquired pneumonia [J18.9] <principal problem not specified>      Precautions: Fall  Chart, occupational therapy assessment, plan of care, and goals were reviewed.    ASSESSMENT  Patient continues with skilled OT services and is progressing towards goals.  Patient is received on 6L O2 with resting SpO2 92-93%. Patient participates in serial bathroom activities, benefiting from min assist for lower body clothing management during toileting. Patient stands at sink for handwashing and brushing teeth, benefiting from intermittent trunk support for increased stability during bimanual tasks engagement. Patient very motivated, receptive to education on energy conservation, pacing, and incorporation of pursed lip breathing into functional activity. Pending progress, patient may benefit from [facility] vs OP follow-up at discharge for best outcome.    Current Level of  Function Impacting Discharge (ADLs): CGA - min A    Other factors to consider for discharge: fall risk         PLAN :  Patient continues to benefit from skilled intervention to address the above impairments.  Continue treatment per established plan of care to address goals.    Recommendation for discharge: (in order for the patient to meet his/her long term goals)  To be determined: HH vs OP and increased supervision/assist    This discharge recommendation:  Has been made in collaboration with the attending provider and/or case management    IF patient discharges home will need the following DME: shower chair and walker: rolling       SUBJECTIVE:   Patient stated "I need to brush my teeth."    OBJECTIVE DATA SUMMARY:   Cognitive/Behavioral Status:  Neurologic State: Alert;Appropriate for age  Orientation Level: Oriented X4  Cognition: Follows commands  Perception: Appears intact  Perseveration: No perseveration noted  Safety/Judgement: Awareness of environment;Fall prevention;Insight into deficits    Functional Mobility and Transfers for ADLs:  Bed Mobility:  Rolling: Independent  Supine to Sit: Independent    Transfers:  Sit to Stand: Contact guard assistance  Functional Transfers  Bathroom Mobility: Contact guard assistance  Toilet Transfer : Contact guard assistance;Adaptive equipment  Cues: Verbal cues provided;Visual cues provided  Bed to Chair: Contact guard assistance    Balance:  Sitting: Intact  Standing: Impaired  Standing - Static: Constant support;Good  Standing - Dynamic : Constant support;Fair    ADL Intervention:  Grooming  Grooming Assistance: Contact guard assistance  Position Performed: Standing  Washing Face: Contact guard assistance  Washing Hands: Contact  guard assistance  Brushing Teeth: Contact guard assistance  Cues: Verbal cues provided;Visual cues provided    Lower Body Dressing Assistance  Dressing Assistance: Supervision  Socks: Supervision;Compensatory technique  training    Toileting  Toileting Assistance: Minimum assistance  Bladder Hygiene: Contact guard assistance  Clothing Management: Compensatory technique training  Cues: Verbal cues provided (physical assist)  Adaptive Equipment: Grab bars    Cognitive Retraining  Orientation Retraining: Awareness of environment  Problem Solving: Identifying the problem;Awareness of environment  Organizing/Sequencing: Breaking task down  Safety/Judgement: Awareness of environment;Fall prevention;Insight into deficits    Education on energy conservation, pacing, incorporating pursed lip breathing into activities and exercise.    Therapeutic Exercises:   Pursed lip breathing.    Pain:  No reports.    Activity Tolerance:   Fair, desaturates with exertion and requires oxygen, and requires rest breaks    After treatment patient left in no apparent distress:   Sitting in chair, Call bell within reach, and Bed / chair alarm activated    COMMUNICATION/COLLABORATION:   The patient's plan of care was discussed with: Physical therapist and Registered nurse.     Lavanda Shackle, OT  Time Calculation: 26 mins

## 2020-11-21 NOTE — Progress Notes (Signed)
Progress  Notes by Avel Sensor, MD at 11/21/20 1018                Author: Avel Sensor, MD  Service: Internal Medicine  Author Type: Physician       Filed: 11/21/20 1021  Date of Service: 11/21/20 1018  Status: Signed          Editor: Avel Sensor, MD (Physician)                       Hospitalist Progress Note      NAME: Christina Stevens    DOB:  1980/03/03    MRN:  [id number]          Assessment / Plan:        Community-acquired pneumonia  RSV induced hypoxic respiratory failure  Chest x-ray  showed bilateral upper lobe infiltrates  Diffuse bilateral consolidation, groundglass opacities was seen on CT spine  Continue ceftriaxone  and azithromycin  Sputum culture, blood culture  Urine strep and Legionella. Rapid covid/ flu negative   Respiratory virus panel positive for RSV   Robitussin as needed  RRT overnight 10/20 due to increased oxygen requirement, currently on 8 L high flow from 10 overnight. Cont to wean off as tolerated for goal >92%   chest x-ray 10/20 with increased bilateral infiltrates   Very high risk for deterioration   was given Solu-Medrol 60 mg every 6 hours overnight, will continue for now. Cont to wean as tolerated   CT chest Bilateral multifocal pneumonia       Generalized weakness:   Has weakness greater on lower > upper extremity, could be d/t pneumonia/ infection   CT thoracic and lumbar spine show no significant spinal stenosis   Physical therapy evaluation   Pain control          ADHD  Continue with Adderal    Hypercholesterolemia   Cw statin       PCOS:  Takes medical marijuana at home       Active smoker:   Counseling was provided about smoke cessation  Patient report that she will not smoke anymore         30.0 - 39.9 Obese /  Body mass index is 31.74 kg/m??.      Estimated discharge date: October 23   Barriers: Clinical improvement      Code status: Full   Prophylaxis: Lovenox   Recommended Disposition: Home w/Family          Subjective:        Patient was seen and examined.    No acute events overnight.      Discussed with RN overnight events.      All patient's questions were answered.      "Start to feel much better"      Review of Systems:           Symptom  Y/N  Comments    Symptom  Y/N  Comments             Fever/Chills  n      Chest Pain  n               Poor Appetite        Edema         Cough  n      Abdominal Pain  n               Sputum  Joint Pain         SOB/DOE  y      Pruritis/Rash         Nausea/vomit  n      Tolerating PT/OT         Diarrhea        Tolerating Diet  y               Constipation        Other               Could NOT obtain due to:                  Objective:        VITALS:    Last 24hrs VS reviewed since prior progress note. Most recent are:   Patient Vitals for the past 24 hrs:            Temp  Pulse  Resp  BP  SpO2            11/21/20 0738  97.8 ??F (36.6 ??C)  71  15  120/68  99 %            11/21/20 0718  --  --  --  --  96 %     11/21/20 0503  --  --  --  --  96 %     11/21/20 0333  97.4 ??F (36.3 ??C)  89  21  130/84  90 %     11/21/20 0035  --  --  --  --  94 %     11/20/20 2150  --  --  --  --  98 %     11/20/20 2148  98.2 ??F (36.8 ??C)  71  11  107/70  96 %     11/20/20 2139  --  --  --  --  95 %     11/20/20 1918  97.9 ??F (36.6 ??C)  79  22  122/83  90 %     11/20/20 1630  98.8 ??F (37.1 ??C)  86  24  117/88  92 %     11/20/20 1600  --  87  21  --  94 %     11/20/20 1500  --  87  17  --  95 %     11/20/20 1436  --  --  --  --  94 %     11/20/20 1400  --  96  24  --  90 %     11/20/20 1300  --  77  15  --  95 %     11/20/20 1200  --  89  16  --  95 %     11/20/20 1154  --  --  --  --  96 %            11/20/20 1100  --  100  24  --  96 %              Intake/Output Summary (Last 24 hours) at 11/21/2020 1018   Last data filed at 11/21/2020 0738     Gross per 24 hour        Intake  0 ml        Output  --        Net  0 ml               I had a face to face encounter  and independently examined this patient on 11/21/2020, as outlined below:   PHYSICAL EXAM:    General: WD, WN. Alert, cooperative, no acute distress     EENT:  EOMI. Anicteric sclerae. MMM   Resp:  CTA bilaterally, no wheezing but bilateral rales. no accessory muscle use   CV:  Regular  rhythm,  No edema   GI:  Soft, Non distended, Non tender.  +Bowel sounds   Neurologic:  EOMs intact. No facial asymmetry. No aphasia or slurred speech. Symmetrical strength, Sensation grossly intact. Alert and oriented X 4.       Psych:   Good insight. Not anxious nor agitated   Skin:  No rashes.  No jaundice      Reviewed most current lab test results and cultures  YES   Reviewed most current radiology test results   YES   Review and summation of old records today    NO   Reviewed patient's current orders and MAR    YES   PMH/SH reviewed - no change compared to H&P   ________________________________________________________________________   Care Plan discussed with:           Comments         Patient  y           Family              RN  y       Research scientist (physical sciences)                                  Multidiciplinary team rounds were held today with case manager, nursing, pharmacist and Higher education careers adviser.  Patient's plan of care was discussed; medications  were reviewed and discharge planning was addressed.                     Comments         >50% of visit spent in counseling and coordination of care         ________________________________________________________________________   Avel Sensor, MD       Procedures: see electronic medical records for all procedures/Xrays and details which were not copied into this note but were reviewed prior to creation of Plan.        LABS:   I reviewed today's most current labs and imaging studies.   Pertinent labs include:     Recent Labs           11/20/20   0451     WBC  14.7*     HGB  14.6     HCT  42.1        PLT  204             Recent Labs           11/20/20   0451     NA  136     K  4.0     CL  103     CO2  27     GLU  117*     BUN  5*     CREA  0.73        CA   9.0              Signed: Avel Sensor, MD

## 2020-11-21 NOTE — Progress Notes (Signed)
ADULT PROTOCOL: JET AEROSOL ASSESSMENT    Patient  Christina Stevens     40 y.o.   female     11/21/2020  2:39 PM    Breath Sounds Pre Procedure: Right Breath Sounds: Diminished                               Left Breath Sounds: Diminished    Breath Sounds Post Procedure: Right Breath Sounds: Diminished, Scattered wheezing                                 Left Breath Sounds: Diminished, Scattered wheezing    Breathing pattern: Pre procedure Breathing Pattern: Regular          Post procedure Breathing Pattern: Regular    Heart Rate: Pre procedure Pulse: 85           Post procedure Pulse: 78    Resp Rate: Pre procedure Respirations: 19           Post procedure Respirations: 18        Oxygen: O2 Device: Hi flow nasal cannula   O2 Flow Rate (L/min): 6 l/min     Changed: NO    SpO2: Pre procedure SpO2: 94 %   with oxygen              Post procedure SpO2: 96 %  with oxygen    Nebulizer Therapy: Current medications Aerosolized Medications: DuoNeb      Changed: NO    Smoking History:   Social History     Tobacco Use   Smoking Status Every Day   Smokeless Tobacco Never       Problem List:   Patient Active Problem List   Diagnosis Code    Facial cellulitis L03.211    Community acquired pneumonia J18.9       Respiratory Therapist: Noreene Larsson DeVittorio, RT

## 2020-11-21 NOTE — Progress Notes (Signed)
The patient was not available when I made rounds.  Father Chris Haydinger

## 2020-11-21 NOTE — Progress Notes (Signed)
Problem: Mobility Impaired (Adult and Pediatric)  Goal: *Therapy Goal (Edit Goal, Insert Text)  Description: FUNCTIONAL STATUS PRIOR TO ADMISSION: Pt independent and working PTA. Pt reports fluctuating periods of numbness and weakness in B LEs. Being followed by [facility] physicians per pt report for neuro and rheum workup.     HOME SUPPORT PRIOR TO ADMISSION: Lives with wife in 2 story home, bedroom upstairs. 4 STE with B HR, full flight inside with B HR (3 steps, landing, 8 steps). Pt owns no DME.     Physical Therapy Goals  Initiated 11/19/2020  1.  Patient will move from supine to sit and sit to supine , scoot up and down, and roll side to side in bed with independence within 7 day(s).    2.  Patient will transfer from bed to chair and chair to bed with supervision/set-up using the least restrictive device within 7 day(s).  3.  Patient will perform sit to stand with supervision/set-up within 7 day(s).  4.  Patient will ambulate with supervision/set-up for 150 feet with the least restrictive device within 7 day(s).   5.  Patient will ascend/descend 4 stairs with B handrail(s) with minimal assistance/contact guard assist within 7 day(s).   Outcome: Progressing Towards Goal   PHYSICAL THERAPY TREATMENT  Patient: Christina Stevens (40 y.o. female)  Date: 11/21/2020  Diagnosis: Community acquired pneumonia [J18.9] <principal problem not specified>      Precautions: Fall  Chart, physical therapy assessment, plan of care and goals were reviewed.    ASSESSMENT  Patient continues with skilled PT services and is progressing towards goals. Patient received in bed and agreeable to participate, on 6 liters 02.  Came to sit EOB at indep level and sitting balance is intact.  Sats at 93% in sitting position, improved to 97% using slow PLB and incentive spirometer.  Ambulated x approx 25 feet with RW and CGA/min assist, including into bathroom to toilet and stand at sink for functional reaching activities.  Gait remains mildly  unsteady and patient had two incidences of buckling of left LE, was able to self correct.  Returned to  chair and instructed in seated exercises for LE strengthening, core activation, and trunk mobility.  Good tolerance of all activity today, sats dropped briefly to 88% with exercises but quickly returned to mid 90s.  Educated on pacing and progression of activity, falls prevention and safety, and left up in chair with call bell in reach.  Patient is making good progress and expect she will be able to discharge home with HHPT or outpatient PT to follow.     Current Level of Function Impacting Discharge (mobility/balance): CGA/min assist for ambulation, buckling of left LE    Other factors to consider for discharge: falls risk, has supportive family         PLAN :  Patient continues to benefit from skilled intervention to address the above impairments.  Continue treatment per established plan of care.  to address goals.    Recommendation for discharge: (in order for the patient to meet his/her long term goals)  To be determined: HHPT vs outpatient PT    This discharge recommendation:  Has been made in collaboration with the attending provider and/or case management    IF patient discharges home will need the following DME: rolling walker       SUBJECTIVE:   Patient stated "I've been doing everything therapy taught me."    OBJECTIVE DATA SUMMARY:   Critical Behavior:  Neurologic State:  Alert  Orientation Level: Oriented X4  Cognition: Appropriate decision making, Appropriate for age attention/concentration, Appropriate safety awareness, Follows commands  Safety/Judgement: Awareness of environment, Insight into deficits, Home safety, Fall prevention  Functional Mobility Training:  Bed Mobility:                    Transfers:  Sit to Stand: Contact guard assistance  Stand to Sit: Contact guard assistance        Bed to Chair: Contact guard assistance                    Balance:  Sitting: Intact  Standing:  Impaired  Standing - Static: Constant support;Good  Standing - Dynamic : Constant support;Fair  Ambulation/Gait Training:  Distance (ft): 25 Feet (ft)  Assistive Device: Gait belt;Walker, rolling  Ambulation - Level of Assistance: Contact guard assistance;Minimal assistance        Gait Abnormalities: Decreased step clearance;Other (slight buckling left LE)        Base of Support: Widened     Speed/Cadence: Pace decreased (<100 feet/min)  Step Length: Left shortened;Right shortened                    Stairs:              Therapeutic Exercises:   Ankle pumps, LAQ, bilateral leg raises, hip abd add, trunk rotations, head turns, overhead reaching with coordinated breathing  Pain Rating:      Activity Tolerance:   Fair, desaturates with exertion and requires oxygen, requires rest breaks, and observed SOB with activity    After treatment patient left in no apparent distress:   Sitting in chair, Call bell within reach, and Bed / chair alarm activated    COMMUNICATION/COLLABORATION:   The patient's plan of care was discussed with: Occupational therapist and Registered nurse.     Rica Koyanagi, PT   Time Calculation: 35 mins

## 2020-11-21 NOTE — Progress Notes (Signed)
End of Shift Note    Bedside shift change report given to Herbert Seta, RN (oncoming nurse) by Charlotte Crumb, RN (offgoing nurse).  Report included the following information SBAR, Kardex, and MAR    Shift worked:  nights     Shift summary and any significant changes:     2045-Pt had 7/10 back pain. PRN Valium given.  -Pt requested to have veni mask on while she's sleeping due to hi-flo causing her nares irritation. At 2150, pt transitioned to veni mask at 50%  2245--Pt had 8/10 back pain. PRN Dilaudid given.  0046-Pt has 8/10 back pain. PRN tylenol and flexeril given  -0346 Pt had 8/10 back pain. PRN Dilaudid given. Originally pulled Valium due to it being a higher dose for pt's consistent pain. Pt refused due to it's effects not lasting long for her pain. Valium not given and wasted with Coralee North, RN  -0510 Pt had 8/10 back pain. Pt now requesting Valium. PRN Valium given.  -Pt had no AM labs  -Pt requesting no meds or breathing treatments to be done when her friend isn't present in the room and while she's asleep. Will relay in report   Concerns for physician to address:  Pt is getting around the clock pain medication but it doesn't seem like its aiding in much pain relief for pt unless she's sleeping     Zone phone for oncoming shift:   -       Activity:  Activity Level: Up with Assistance  Number times ambulated in hallways past shift: 0  Number of times OOB to chair past shift: 1    Cardiac:   Cardiac Monitoring: Yes      Cardiac Rhythm: Sinus Rhythm    Access:  Current line(s): PIV     Genitourinary:   Urinary status: voiding    Respiratory:   O2 Device: Hi flow nasal cannula  Chronic home O2 use?: NO  Incentive spirometer at bedside: YES       GI:  Last Bowel Movement Date: 11/19/20  Current diet:  ADULT DIET Regular; Low Fat/Low Chol/High Fiber/NAS  Passing flatus: YES  Tolerating current diet: YES       Pain Management:   Patient states pain is manageable on current regimen: YES    Skin:  Braden Score:  22  Interventions: increase time out of bed and PT/OT consult    Patient Safety:  Fall Score: Total Score: 4  Interventions: bed/chair alarm, gripper socks, pt to call before getting OOB, and stay with me (per policy)  High Fall Risk: Yes    Length of Stay:  Expected LOS: 2d 12h  Actual LOS: 3      Charlotte Crumb, RN

## 2020-11-22 MED ORDER — IPRATROPIUM-ALBUTEROL 2.5 MG-0.5 MG/3 ML NEB SOLUTION
2.5-0.53 mg-0.5 mg/3 ml | RESPIRATORY_TRACT | Status: DC
Start: 2020-11-22 — End: 2020-11-23
  Administered 2020-11-22 – 2020-11-23 (×5): via RESPIRATORY_TRACT

## 2020-11-22 MED ORDER — HYDROMORPHONE 1 MG/ML INJECTION SOLUTION
1 mg/mL | INTRAMUSCULAR | Status: AC | PRN
Start: 2020-11-22 — End: 2020-11-24
  Administered 2020-11-22 – 2020-11-24 (×12): via INTRAVENOUS

## 2020-11-22 MED FILL — IPRATROPIUM-ALBUTEROL 2.5 MG-0.5 MG/3 ML NEB SOLUTION: 2.5 mg-0.5 mg/3 ml | RESPIRATORY_TRACT | Qty: 3

## 2020-11-22 MED FILL — DEXTROMETHORPHAN-GUAIFENESIN 10 MG-100 MG/5 ML SYRUP: 100-10 mg/5 mL | ORAL | Qty: 10

## 2020-11-22 MED FILL — MUCINEX 600 MG TABLET, EXTENDED RELEASE: 600 mg | ORAL | Qty: 1

## 2020-11-22 MED FILL — HYDROMORPHONE (PF) 1 MG/ML IJ SOLN: 1 mg/mL | INTRAMUSCULAR | Qty: 1

## 2020-11-22 MED FILL — AZITHROMYCIN 500 MG IV SOLUTION: 500 mg | INTRAVENOUS | Qty: 5

## 2020-11-22 MED FILL — LIPITOR 20 MG TABLET: 20 mg | ORAL | Qty: 1

## 2020-11-22 MED FILL — ONDANSETRON (PF) 4 MG/2 ML INJECTION: 4 mg/2 mL | INTRAMUSCULAR | Qty: 2

## 2020-11-22 MED FILL — CYCLOBENZAPRINE 10 MG TAB: 10 mg | ORAL | Qty: 1

## 2020-11-22 MED FILL — DIAZEPAM 5 MG/ML SYRINGE: 5 mg/mL | INTRAMUSCULAR | Qty: 2

## 2020-11-22 MED FILL — SOLU-MEDROL (PF) 40 MG/ML SOLUTION FOR INJECTION: 40 mg/mL | INTRAMUSCULAR | Qty: 2

## 2020-11-22 MED FILL — CEFTRIAXONE 1 GRAM SOLUTION FOR INJECTION: 1 gram | INTRAMUSCULAR | Qty: 1

## 2020-11-22 MED FILL — NICOTINE 14 MG/24 HR DAILY PATCH: 14 mg/24 hr | TRANSDERMAL | Qty: 1

## 2020-11-22 MED FILL — AMPHETAMINE-DEXTROAMPHETAMINE 10 MG TAB: 10 mg | ORAL | Qty: 4

## 2020-11-22 MED FILL — RISAQUAD 8 BILLION CELL CAPSULE: 8 billion cell | ORAL | Qty: 1

## 2020-11-22 MED FILL — SALONPAS (LIDOCAINE) 4 % TOPICAL PATCH: 4 % | CUTANEOUS | Qty: 1

## 2020-11-22 MED FILL — POTASSIUM CHLORIDE SR 20 MEQ TAB, PARTICLES/CRYSTALS: 20 mEq | ORAL | Qty: 1

## 2020-11-22 MED FILL — ENOXAPARIN 40 MG/0.4 ML SUB-Q SYRINGE: 40 mg/0.4 mL | SUBCUTANEOUS | Qty: 0.4

## 2020-11-22 NOTE — Progress Notes (Signed)
Problem: Pain  Goal: *Control of Pain  Outcome: Progressing Towards Goal     Problem: Patient Education: Go to Patient Education Activity  Goal: Patient/Family Education  Outcome: Progressing Towards Goal     Problem: Patient Education: Go to Patient Education Activity  Goal: Patient/Family Education  Outcome: Progressing Towards Goal     Problem: Patient Education: Go to Patient Education Activity  Goal: Patient/Family Education  Outcome: Progressing Towards Goal     Problem: Pneumonia: Day 4  Goal: Off Pathway (Use only if patient is Off Pathway)  Outcome: Progressing Towards Goal  Goal: Activity/Safety  Outcome: Progressing Towards Goal

## 2020-11-22 NOTE — Progress Notes (Signed)
ADULT PROTOCOL: JET AEROSOL  REASSESSMENT    Patient  Christina Stevens     40 y.o.   female     11/22/2020  10:48 AM    Breath Sounds Pre Procedure: Right Breath Sounds: Expiratory wheezing                               Left Breath Sounds: Expiratory wheezing    Breath Sounds Post Procedure: Right Breath Sounds: Diminished, Scattered wheezing                                 Left Breath Sounds: Diminished, Scattered wheezing    Breathing pattern: Pre procedure Breathing Pattern: Regular          Post procedure Breathing Pattern: Dyspnea at rest    Heart Rate: Pre procedure Pulse: 79           Post procedure Pulse: 87    Resp Rate: Pre procedure Respirations: 17           Post procedure Respirations: 17      Oxygen: 50% Ventimask     Changed: No    SpO2: Pre procedure SpO2: 99 %                 Post procedure SpO2: 99 %      Nebulizer Therapy: Current medications Aerosolized Medications: DuoNeb      Changed: NO        Problem List:   Patient Active Problem List   Diagnosis Code    Facial cellulitis L03.211    Community acquired pneumonia J18.9       Respiratory Therapist: Rondel Jumbo, RT

## 2020-11-22 NOTE — Progress Notes (Signed)
Respiratory Care;    Educated patient on Acapella, to help aid in deep breathing techniques, lung expansion and clearing secretions.

## 2020-11-22 NOTE — Progress Notes (Signed)
Progress Notes by Alisia Ferrari, MD at 11/22/20 [phone]                Author: Alisia Ferrari, MD  Service: Internal Medicine  Author Type: Physician       Filed: 11/22/20 1001  Date of Service: 11/22/20 0952  Status: Signed          Editor: Alisia Ferrari, MD (Physician)               Hospitalist Progress Note      NAME: Christina Stevens    DOB:  18-Feb-1980    MRN:  [id number]                     Subjective:        Chief Complaint / Reason for Physician Visit   Patient seen and evaluated at bedside, overnight events reviewed, still complaining of shortness of breath, slight improvement since yesterday, still has chest discomfort.  Discussed with RN events  overnight.       Review of Systems:           Symptom  Y/N  Comments    Symptom  Y/N  Comments             Fever/Chills  N      Chest Pain  N               Poor Appetite  Y      Edema  n               Cough  Y      Abdominal Pain  N       Sputum  Y      Joint Pain  N       SOB/DOE  Y      Pruritis/Rash  N       Nausea/vomit  N      Tolerating PT/OT  NA       Diarrhea  N      Tolerating Diet  Y               Constipation  N      Other               Could NOT obtain due to:            Objective:        VITALS:    Last 24hrs VS reviewed since prior progress note. Most recent are:   Patient Vitals for the past 24 hrs:            Temp  Pulse  Resp  BP  SpO2            11/22/20 0735  --  --  --  --  99 %            11/22/20 0239  97.4 ??F (36.3 ??C)  95  20  136/73  90 %     11/22/20 0148  --  --  --  --  94 %     11/21/20 2353  98.2 ??F (36.8 ??C)  83  14  122/73  96 %     11/21/20 2034  --  --  --  --  92 %     11/21/20 2023  97.4 ??F (36.3 ??C)  84  22  115/71  94 %     11/21/20 1934  --  70  19  123/85  94 %     11/21/20 1930  --  --  --  --  92 %     11/21/20 1451  97.5 ??F (36.4 ??C)  69  20  (!) 127/91  95 %     11/21/20 1325  --  --  --  --  94 %            11/21/20 1105  97.8 ??F (36.6 ??C)  88  20  122/84  95 %            Intake/Output Summary (Last 24 hours) at 11/22/2020 0953   Last data filed at 11/21/2020 1451     Gross per 24 hour        Intake  500 ml        Output  --        Net  500 ml            PHYSICAL EXAM:   General: Patient appears comfortable    EENT:  EOMI. Anicteric sclerae. MMM   Resp:  Decreased air entry bilaterally with appreciable bilateral bibasillar crackles with egophany   CV:  Regular  rhythm, s1/s2 no m/r/g No edema   GI:  Soft, Non distended, Non tender.  +Bowel sounds   Neurologic:  Alert and oriented X 3, normal speech,    Psych:   Good insight. Not anxious nor agitated   Skin:  No rashes.  No jaundice      Procedures: see electronic medical records for all procedures/Xrays and details which were not copied into this note but were reviewed prior to creation of Plan.        LABS:   I reviewed today's most current labs and imaging studies.   Pertinent labs include:     Recent Labs           11/20/20   0451     WBC  14.7*     HGB  14.6     HCT  42.1        PLT  204          Recent Labs           11/20/20   0451     NA  136     K  4.0     CL  103     CO2  27     GLU  117*     BUN  5*     CREA  0.73        CA  9.0           Signed: Kristopher Attwood Ellie Lunch, MD      CT chest abdomen/pelvis:IMPRESSION       1. Bilateral multifocal pneumonia.       2. No acute findings in the abdomen or pelvis.      Reviewed most current lab test results and cultures  YES   Reviewed most current radiology test results   YES   Review and summation of old records today    NO   Reviewed patient's current orders and MAR    YES   PMH/SH reviewed - no change compared to H&P         Assessment / Plan:     Acute respiratory failure with hypoxia secondary to bilateral pneumonia/RSV infection- patient presents with above-mentioned symptomatology with acute respiratory failure with hypoxia requiring 12 L for ventilatory support, currently does not meet sepsis criteria at  this time  Continue methylprednisone 60 mg IV every 6  Ceftriaxone   azithromycin for antibiotic coverage  Aggressive chest physiotherapy  DuoNebs every 4  Continue Mucinex twice daily  Aggressive incentive spirometry  Attempt to wean oxygen  Pulmonology consult further evaluation    Current  smoker-continue nicotine patch during the course of this admission    Hyperlipidemia -continue statin    History of ADHD-continue current medications     History of PCOS-continue supportive care    Prophylaxis- Lovenox  FEN-cardiac diet, replete potassium and magnesium  Full code, will clarify about surrogate decision-maker  Disposition -pending clinical improvement, decreasing oxygen requirement, pulmonology evaluation, greater than 48 hours         30.0 - 39.9 Obese /  Body mass index is 31.74 kg/m??.      Code status: Full   Prophylaxis: Lovenox   Recommended Disposition:  TBD        ________________________________________________________________________   Care Plan discussed with:           Comments         Patient  x           Family              RN  x       Care Manager  x           Consultant   x                             x  Multidiciplinary team rounds were held today with case manager, nursing, pharmacist and clinical coordinator.  Patient's plan of care was discussed; medications  were reviewed and discharge planning was addressed.       ________________________________________________________________________   Total NON critical care TIME:  35   Minutes              Comments         >50% of visit spent in counseling and coordination of care  x       ________________________________________________________________________   Alisia Ferrari, MD

## 2020-11-22 NOTE — Progress Notes (Signed)
End of Shift Note    Bedside shift change report given to Corina (oncoming nurse) by London Sheer, RN (offgoing nurse).  Report included the following information SBAR, Kardex, Procedure Summary, Intake/Output, Recent Results, Med Rec Status, Cardiac Rhythm SR/ST, Alarm Parameters , and Quality Measures    Shift worked:  0700-1900     Shift summary and any significant changes:     Pt had uneventful shift     Concerns for physician to address:  N/A     Zone phone for oncoming shift:   [phone]       Activity:  Activity Level: Up with Assistance  Number times ambulated in hallways past shift: 5  Number of times OOB to chair past shift: 5    Cardiac:   Cardiac Monitoring: Yes      Cardiac Rhythm: Sinus Rhythm    Access:  Current line(s): PIV     Genitourinary:   Urinary status: voiding    Respiratory:   O2 Device: Hi flow nasal cannula  Chronic home O2 use?: NO  Incentive spirometer at bedside: NO       GI:  Last Bowel Movement Date: 11/19/20  Current diet:  ADULT DIET Regular; Low Fat/Low Chol/High Fiber/NAS  Passing flatus: YES  Tolerating current diet: YES       Pain Management:   Patient states pain is manageable on current regimen: YES    Skin:  Braden Score: 22  Interventions: increase time out of bed, PT/OT consult, and internal/external urinary devices    Patient Safety:  Fall Score: Total Score: 4  Interventions: bed/chair alarm, gripper socks, pt to call before getting OOB, and stay with me (per policy)  High Fall Risk: Yes    Length of Stay:  Expected LOS: 2d 12h  Actual LOS: 5      London Sheer, RN

## 2020-11-22 NOTE — Progress Notes (Signed)
End of Shift Note    Bedside shift change report given to Lauren RN (oncoming nurse) by Marchelle Gearing (offgoing nurse).  Report included the following information SBAR and Kardex    Shift worked:  1900-0700     Shift summary and any significant changes:    Pt getting IV dilaudid Q 4, valuim IV Q 6 and PRN flexeril Q 8, still reporting high levels of pain and anxiety.     Concerns for physician to address: See above   Zone phone for oncoming shift:       Activity:  Activity Level: Up with Assistance  Number times ambulated in hallways past shift: 0  Number of times OOB to chair past shift: 0    Cardiac:   Cardiac Monitoring: Yes      Cardiac Rhythm: Sinus Rhythm    Access:  Current line(s): PIV     Genitourinary:   Urinary status: voiding    Respiratory:   O2 Device: Venturi-mask  Chronic home O2 use?: NO  Incentive spirometer at bedside: YES       GI:  Last Bowel Movement Date: 11/19/20  Current diet:  ADULT DIET Regular; Low Fat/Low Chol/High Fiber/NAS  Passing flatus: YES  Tolerating current diet: YES       Pain Management:   Patient states pain is manageable on current regimen: YES    Skin:  Braden Score: 22  Interventions: increase time out of bed    Patient Safety:  Fall Score: Total Score: 4  Interventions: pt to call before getting OOB  High Fall Risk: Yes    Length of Stay:  Expected LOS: 2d 12h  Actual LOS: 5      Corinna L Josselyn

## 2020-11-23 LAB — BASIC METABOLIC PANEL
Anion Gap: 8 mmol/L (ref 5–15)
BUN: 10 MG/DL (ref 6–20)
Bun/Cre Ratio: 12 (ref 12–20)
CO2: 25 mmol/L (ref 21–32)
Calcium: 8.8 MG/DL (ref 8.5–10.1)
Chloride: 104 mmol/L (ref 97–108)
Creatinine: 0.83 MG/DL (ref 0.55–1.02)
ESTIMATED GLOMERULAR FILTRATION RATE: >60 mL/min/{1.73_m2} (ref >60)
Glucose: 284 mg/dL — ABNORMAL HIGH (ref 65–100)
Potassium: 4.7 mmol/L (ref 3.5–5.1)
Sodium: 137 mmol/L (ref 136–145)

## 2020-11-23 LAB — CULTURE, BLOOD, PERIPHERAL
Culture result:: NO GROWTH
Culture: NO GROWTH

## 2020-11-23 LAB — POCT GLUCOSE
POC Glucose: 349 mg/dL — ABNORMAL HIGH (ref 65–117)
POC Glucose: 274 mg/dL — ABNORMAL HIGH (ref 65–117)

## 2020-11-23 LAB — CBC
Hematocrit: 37.9 % (ref 35.0–47.0)
Hemoglobin: 12.7 g/dL (ref 11.5–16.0)
MCH: 33.2 PG (ref 26.0–34.0)
MCHC: 33.5 g/dL (ref 30.0–36.5)
MCV: 99 FL (ref 80.0–99.0)
MPV: 10 FL (ref 8.9–12.9)
NRBC Absolute: 0 10*3/uL (ref 0.00–0.01)
Nucleated RBCs: 0 PER 100 WBC
Platelets: 295 10*3/uL (ref 150–400)
RBC: 3.83 M/uL (ref 3.80–5.20)
RDW: 12.6 % (ref 11.5–14.5)
WBC: 22.7 10*3/uL — ABNORMAL HIGH (ref 3.6–11.0)

## 2020-11-23 LAB — MAGNESIUM
Magnesium: 2 mg/dL (ref 1.6–2.4)
Magnesium: 2 mg/dL (ref 1.6–2.4)

## 2020-11-23 LAB — GLUCOSE, POC
Glucose (POC): 349 mg/dL — ABNORMAL HIGH (ref 65–117)
Glucose (POC): 274 mg/dL — ABNORMAL HIGH (ref 65–117)

## 2020-11-23 LAB — CBC W/O DIFF
ABSOLUTE NRBC: 0 10*3/uL (ref 0.00–0.01)
HCT: 37.9 % (ref 35.0–47.0)
HGB: 12.7 g/dL (ref 11.5–16.0)
MCH: 33.2 PG (ref 26.0–34.0)
MCHC: 33.5 g/dL (ref 30.0–36.5)
MCV: 99 FL (ref 80.0–99.0)
MPV: 10 FL (ref 8.9–12.9)
NRBC: 0 PER 100 WBC
PLATELET: 295 10*3/uL (ref 150–400)
RBC: 3.83 M/uL (ref 3.80–5.20)
RDW: 12.6 % (ref 11.5–14.5)
WBC: 22.7 10*3/uL — ABNORMAL HIGH (ref 3.6–11.0)

## 2020-11-23 LAB — METABOLIC PANEL, BASIC
Anion gap: 8 mmol/L (ref 5–15)
BUN/Creatinine ratio: 12 (ref 12–20)
BUN: 10 MG/DL (ref 6–20)
CO2: 25 mmol/L (ref 21–32)
Calcium: 8.8 MG/DL (ref 8.5–10.1)
Chloride: 104 mmol/L (ref 97–108)
Creatinine: 0.83 MG/DL (ref 0.55–1.02)
Glucose: 284 mg/dL — ABNORMAL HIGH (ref 65–100)
Potassium: 4.7 mmol/L (ref 3.5–5.1)
Sodium: 137 mmol/L (ref 136–145)
eGFR: >60 mL/min/{1.73_m2} (ref >60)

## 2020-11-23 MED ORDER — IPRATROPIUM-ALBUTEROL 2.5 MG-0.5 MG/3 ML NEB SOLUTION
2.5 mg-0.5 mg/3 ml | Freq: Four times a day (QID) | RESPIRATORY_TRACT | Status: AC
Start: 2020-11-23 — End: 2020-11-24
  Administered 2020-11-23 – 2020-11-24 (×5): via RESPIRATORY_TRACT

## 2020-11-23 MED ORDER — DIAZEPAM 5 MG/ML SYRINGE
5 mg/mL | Freq: Once | INTRAMUSCULAR | Status: AC
Start: 2020-11-23 — End: 2020-11-23
  Administered 2020-11-23: 15:00:00 via INTRAVENOUS

## 2020-11-23 MED ORDER — INSULIN LISPRO 100 UNIT/ML INJECTION
100 unit/mL | Freq: Four times a day (QID) | SUBCUTANEOUS | Status: AC
Start: 2020-11-23 — End: 2020-11-24
  Administered 2020-11-23 – 2020-11-24 (×3): via SUBCUTANEOUS

## 2020-11-23 MED ORDER — DEXTROSE 10% IN WATER (D10W) IV
10 % | INTRAVENOUS | Status: AC | PRN
Start: 2020-11-23 — End: 2020-11-24

## 2020-11-23 MED ORDER — GLUCAGON 1 MG INJECTION
1 mg | INTRAMUSCULAR | Status: AC | PRN
Start: 2020-11-23 — End: 2020-11-24

## 2020-11-23 MED ORDER — GLUCOSE 4 GRAM CHEWABLE TAB
4 gram | ORAL | Status: AC | PRN
Start: 2020-11-23 — End: 2020-11-24

## 2020-11-23 MED FILL — HYDROMORPHONE (PF) 1 MG/ML IJ SOLN: 1 mg/mL | INTRAMUSCULAR | Qty: 1

## 2020-11-23 MED FILL — DIAZEPAM 5 MG/ML SYRINGE: 5 mg/mL | INTRAMUSCULAR | Qty: 2

## 2020-11-23 MED FILL — IPRATROPIUM-ALBUTEROL 2.5 MG-0.5 MG/3 ML NEB SOLUTION: 2.5 mg-0.5 mg/3 ml | RESPIRATORY_TRACT | Qty: 3

## 2020-11-23 MED FILL — CEFTRIAXONE 1 GRAM SOLUTION FOR INJECTION: 1 gram | INTRAMUSCULAR | Qty: 1

## 2020-11-23 MED FILL — SOLU-MEDROL (PF) 40 MG/ML SOLUTION FOR INJECTION: 40 mg/mL | INTRAMUSCULAR | Qty: 2

## 2020-11-23 MED FILL — CYCLOBENZAPRINE 10 MG TAB: 10 mg | ORAL | Qty: 1

## 2020-11-23 MED FILL — MUCINEX 600 MG TABLET, EXTENDED RELEASE: 600 mg | ORAL | Qty: 1

## 2020-11-23 MED FILL — DEXTROSE 10% IN WATER (D10W) IV: 10 % | INTRAVENOUS | Qty: 250

## 2020-11-23 MED FILL — NICOTINE 14 MG/24 HR DAILY PATCH: 14 mg/24 hr | TRANSDERMAL | Qty: 1

## 2020-11-23 MED FILL — RISAQUAD 8 BILLION CELL CAPSULE: 8 billion cell | ORAL | Qty: 1

## 2020-11-23 MED FILL — INSULIN LISPRO 100 UNIT/ML INJECTION: 100 unit/mL | SUBCUTANEOUS | Qty: 1

## 2020-11-23 MED FILL — ENOXAPARIN 40 MG/0.4 ML SUB-Q SYRINGE: 40 mg/0.4 mL | SUBCUTANEOUS | Qty: 0.4

## 2020-11-23 MED FILL — MAPAP (ACETAMINOPHEN) 325 MG TABLET: 325 mg | ORAL | Qty: 2

## 2020-11-23 MED FILL — LIPITOR 20 MG TABLET: 20 mg | ORAL | Qty: 1

## 2020-11-23 MED FILL — SODIUM CHLORIDE 0.9 % IV: INTRAVENOUS | Qty: 250

## 2020-11-23 MED FILL — SALONPAS (LIDOCAINE) 4 % TOPICAL PATCH: 4 % | CUTANEOUS | Qty: 1

## 2020-11-23 MED FILL — AMPHETAMINE-DEXTROAMPHETAMINE 10 MG TAB: 10 mg | ORAL | Qty: 4

## 2020-11-23 MED FILL — AZITHROMYCIN 500 MG IV SOLUTION: 500 mg | INTRAVENOUS | Qty: 5

## 2020-11-23 NOTE — Progress Notes (Signed)
Progress Notes by Alisia Ferrari, MD at 11/23/20 [phone]                Author: Alisia Ferrari, MD  Service: Internal Medicine  Author Type: Physician       Filed: 11/23/20 0856  Date of Service: 11/23/20 0854  Status: Signed          Editor: Alisia Ferrari, MD (Physician)               Hospitalist Progress Note      NAME: Christina Stevens    DOB:  [birth year]    MRN:  [id number]                     Subjective:        Chief Complaint / Reason for Physician Visit   Patient seen and evaluated at bedside, overnight events reviewed, still complaining of shortness of breath, improved since yesterday, weaned off of oxygen, still has wheezing.  Discussed with RN events  overnight.       Review of Systems:           Symptom  Y/N  Comments    Symptom  Y/N  Comments             Fever/Chills  N      Chest Pain  N               Poor Appetite  Y      Edema  n               Cough  Y      Abdominal Pain  N       Sputum  Y      Joint Pain  N       SOB/DOE  Y      Pruritis/Rash  N       Nausea/vomit  N      Tolerating PT/OT  NA       Diarrhea  N      Tolerating Diet  Y               Constipation  N      Other               Could NOT obtain due to:            Objective:        VITALS:    Last 24hrs VS reviewed since prior progress note. Most recent are:   Patient Vitals for the past 24 hrs:            Temp  Pulse  Resp  BP  SpO2            11/23/20 0742  --  --  --  --  100 %            11/23/20 0727  98.2 ??F (36.8 ??C)  98  18  (!) 149/94  96 %     11/23/20 0346  --  --  --  --  94 %     11/23/20 0247  97.6 ??F (36.4 ??C)  77  11  (!) 143/87  99 %     11/23/20 0003  --  --  --  --  96 %     11/22/20 2345  --  --  --  --  95 %     11/22/20 2247  98 ??F (36.7 ??C)  99  25  124/76  96 %     11/22/20 2023  97.5 ??F (36.4 ??C)  88  19  127/87  96 %     11/22/20 2011  --  --  --  --  95 %     11/22/20 1516  --  --  --  --  98 %     11/22/20 1109  --  --  --  --  94 %            11/22/20 1051  --  92  20   125/74  92 %              Intake/Output Summary (Last 24 hours) at 11/23/2020 0854   Last data filed at 11/23/2020 6789     Gross per 24 hour        Intake  0 ml        Output  --        Net  0 ml               PHYSICAL EXAM:   General: Patient appears comfortable    EENT:  EOMI. Anicteric sclerae. MMM   Resp:  Decreased air entry bilaterally with appreciable bilateral bibasillar crackles with egophany   CV:  Regular  rhythm, s1/s2 no m/r/g No edema   GI:  Soft, Non distended, Non tender.  +Bowel sounds   Neurologic:  Alert and oriented X 3, normal speech,    Psych:   Good insight. Not anxious nor agitated   Skin:  No rashes.  No jaundice      Procedures: see electronic medical records for all procedures/Xrays and details which were not copied into this note but were reviewed prior to creation of Plan.        LABS:   I reviewed today's most current labs and imaging studies.   Pertinent labs include:     Recent Labs           11/23/20   0224     WBC  22.7*     HGB  12.7     HCT  37.9        PLT  295             Recent Labs           11/23/20   0224     NA  137     K  4.7     CL  104     CO2  25     GLU  284*     BUN  10     CREA  0.83     CA  8.8        MG  2.0              Signed: Everlean Bucher Ellie Lunch, MD      CT chest abdomen/pelvis:IMPRESSION       1. Bilateral multifocal pneumonia.       2. No acute findings in the abdomen or pelvis.      Reviewed most current lab test results and cultures  YES   Reviewed most current radiology test results   YES   Review and summation of old records today    NO   Reviewed patient's current orders and MAR    YES   PMH/SH reviewed - no change compared to H&P         Assessment / Plan:  Acute respiratory failure with hypoxia secondary to bilateral pneumonia/RSV infection- currently doing well, weaned off of oxygen however still has wheezing on exam  Continue methylprednisone 60 mg IV every 6  Ceftriaxone azithromycin for antibiotic coverage  Aggressive use of incentive  spirometry/acapella device  DuoNebs  every 4  Continue Mucinex twice daily  Aggressive incentive spirometry  Attempt to wean oxygen  Pulmonology consult further evaluation      Hyperglycemia - iatrogenic 2/2 high systemic steroid use   Continue insulin sliding scale    Current smoker-continue nicotine patch during the course of this admission    Hyperlipidemia -continue statin    History of ADHD-continue current medications     History of PCOS-continue supportive care    Prophylaxis- Lovenox  FEN-cardiac diet, replete potassium and magnesium  Full code, will clarify about surrogate decision-maker  Disposition -pending clinical improvement, plan for discharge in 24 hours if improvement in symptomatology         30.0 - 39.9 Obese /  Body mass index is 31.74 kg/m??.      Code status: Full   Prophylaxis: Lovenox   Recommended Disposition:  TBD        ________________________________________________________________________   Care Plan discussed with:           Comments         Patient  x           Family              RN  x       Care Manager  x           Consultant   x                             x  Multidiciplinary team rounds were held today with case manager, nursing, pharmacist and clinical coordinator.  Patient's plan of care was discussed; medications  were reviewed and discharge planning was addressed.       ________________________________________________________________________   Total NON critical care TIME:  35   Minutes              Comments         >50% of visit spent in counseling and coordination of care  x       ________________________________________________________________________   Alisia Ferrari, MD

## 2020-11-23 NOTE — Progress Notes (Signed)
Problem: Falls - Risk of  Goal: *Absence of Falls  Description: Document Christina Stevens Fall Risk and appropriate interventions in the flowsheet.  Outcome: Progressing Towards Goal  Note: Fall Risk Interventions:  Mobility Interventions: Patient to call before getting OOB         Medication Interventions: Bed/chair exit alarm    Elimination Interventions: Call light in reach    History of Falls Interventions: Bed/chair exit alarm, Door open when patient unattended, Room close to nurse's station         Problem: Patient Education: Go to Patient Education Activity  Goal: Patient/Family Education  Outcome: Progressing Towards Goal     Problem: Pain  Goal: *Control of Pain  Outcome: Progressing Towards Goal  Goal: *PALLIATIVE CARE:  Alleviation of Pain  Outcome: Progressing Towards Goal     Problem: Patient Education: Go to Patient Education Activity  Goal: Patient/Family Education  Outcome: Progressing Towards Goal     Problem: Patient Education: Go to Patient Education Activity  Goal: Patient/Family Education  Outcome: Progressing Towards Goal     Problem: Patient Education: Go to Patient Education Activity  Goal: Patient/Family Education  Outcome: Progressing Towards Goal     Problem: Pneumonia: Day 4  Goal: Off Pathway (Use only if patient is Off Pathway)  Outcome: Progressing Towards Goal  Goal: Activity/Safety  Outcome: Progressing Towards Goal  Goal: Nutrition/Diet  Outcome: Progressing Towards Goal  Goal: Discharge Planning  Outcome: Progressing Towards Goal  Goal: Medications  Outcome: Progressing Towards Goal  Goal: Respiratory  Outcome: Progressing Towards Goal  Goal: Treatments/Interventions/Procedures  Outcome: Progressing Towards Goal  Goal: Psychosocial  Outcome: Progressing Towards Goal     Problem: Pneumonia: Discharge Outcomes  Goal: *Demonstrates progressive activity  Outcome: Progressing Towards Goal  Goal: *Describes follow-up/return visits to physicians  Outcome: Progressing Towards Goal  Goal:  *Tolerating diet  Outcome: Progressing Towards Goal  Goal: *Verbalizes name, dosage, time, side effects, and number of days to continue medications  Outcome: Progressing Towards Goal  Goal: *Influenza immunization  Outcome: Progressing Towards Goal  Goal: *Pneumococcal immunization  Outcome: Progressing Towards Goal  Goal: *Respiratory status at baseline  Outcome: Progressing Towards Goal  Goal: *Vital signs within defined limits  Outcome: Progressing Towards Goal  Goal: *Describes available resources and support systems  Outcome: Progressing Towards Goal  Goal: *Optimal pain control at patient's stated goal  Outcome: Progressing Towards Goal     Problem: Patient Education: Go to Patient Education Activity  Goal: Patient/Family Education  Outcome: Progressing Towards Goal     Problem: Risk for Spread of Infection  Goal: Prevent transmission of infectious organism to others  Description: Prevent the transmission of infectious organisms to other patients, staff members, and visitors.  Outcome: Progressing Towards Goal     Problem: Patient Education:  Go to Education Activity  Goal: Patient/Family Education  Outcome: Progressing Towards Goal

## 2020-11-23 NOTE — Progress Notes (Signed)
Problem: Falls - Risk of  Goal: *Absence of Falls  Description: Document Christina Stevens Fall Risk and appropriate interventions in the flowsheet.  Outcome: Progressing Towards Goal  Note: Fall Risk Interventions:  Mobility Interventions: Patient to call before getting OOB, Bed/chair exit alarm, PT Consult for mobility concerns, OT consult for ADLs         Medication Interventions: Bed/chair exit alarm, Patient to call before getting OOB    Elimination Interventions: Call light in reach    History of Falls Interventions: Bed/chair exit alarm, Evaluate medications/consider consulting pharmacy, Investigate reason for fall, Room close to nurse's station         Problem: Patient Education: Go to Patient Education Activity  Goal: Patient/Family Education  Outcome: Progressing Towards Goal     Problem: Pain  Goal: *Control of Pain  Outcome: Progressing Towards Goal  Goal: *PALLIATIVE CARE:  Alleviation of Pain  Outcome: Progressing Towards Goal     Problem: Patient Education: Go to Patient Education Activity  Goal: Patient/Family Education  Outcome: Progressing Towards Goal     Problem: Patient Education: Go to Patient Education Activity  Goal: Patient/Family Education  Outcome: Progressing Towards Goal     Problem: Patient Education: Go to Patient Education Activity  Goal: Patient/Family Education  Outcome: Progressing Towards Goal     Problem: Pneumonia: Day 4  Goal: Off Pathway (Use only if patient is Off Pathway)  Outcome: Progressing Towards Goal  Goal: Activity/Safety  Outcome: Progressing Towards Goal  Goal: Nutrition/Diet  Outcome: Progressing Towards Goal  Goal: Discharge Planning  Outcome: Progressing Towards Goal  Goal: Medications  Outcome: Progressing Towards Goal  Goal: Respiratory  Outcome: Progressing Towards Goal  Goal: Treatments/Interventions/Procedures  Outcome: Progressing Towards Goal  Goal: Psychosocial  Outcome: Progressing Towards Goal     Problem: Pneumonia: Discharge Outcomes  Goal: *Demonstrates  progressive activity  Outcome: Progressing Towards Goal  Goal: *Describes follow-up/return visits to physicians  Outcome: Progressing Towards Goal  Goal: *Tolerating diet  Outcome: Progressing Towards Goal  Goal: *Verbalizes name, dosage, time, side effects, and number of days to continue medications  Outcome: Progressing Towards Goal  Goal: *Influenza immunization  Outcome: Progressing Towards Goal  Goal: *Pneumococcal immunization  Outcome: Progressing Towards Goal  Goal: *Respiratory status at baseline  Outcome: Progressing Towards Goal  Goal: *Vital signs within defined limits  Outcome: Progressing Towards Goal  Goal: *Describes available resources and support systems  Outcome: Progressing Towards Goal  Goal: *Optimal pain control at patient's stated goal  Outcome: Progressing Towards Goal     Problem: Patient Education: Go to Patient Education Activity  Goal: Patient/Family Education  Outcome: Progressing Towards Goal     Problem: Risk for Spread of Infection  Goal: Prevent transmission of infectious organism to others  Description: Prevent the transmission of infectious organisms to other patients, staff members, and visitors.  Outcome: Progressing Towards Goal     Problem: Patient Education:  Go to Education Activity  Goal: Patient/Family Education  Outcome: Progressing Towards Goal

## 2020-11-23 NOTE — Progress Notes (Signed)
End of Shift Note    Bedside shift change report given to [employer] (oncoming nurse) by Marchelle Gearing (offgoing nurse).  Report included the following information SBAR and Kardex  0407:  Pts friend rang stating pt needed pain meds.  When I entered the room, pt was sleeping.  Did not awaken pt to give meds.      Shift worked:  1900-0700     Shift summary and any significant changes:    See below    Concerns for physician to address:  Pts blood sugar was 284 with am labs.       Zone phone for oncoming shift:          Activity:  Activity Level: Up with Assistance  Number times ambulated in hallways past shift: 0  Number of times OOB to chair past shift: 1    Cardiac:   Cardiac Monitoring: Yes      Cardiac Rhythm: Sinus Rhythm    Access:  Current line(s): PIV     Genitourinary:   Urinary status: voiding    Respiratory:   O2 Device: None (Room air)  Chronic home O2 use?: NO  Incentive spirometer at bedside: YES       GI:  Last Bowel Movement Date: 11/22/20  Current diet:  ADULT DIET Regular; Low Fat/Low Chol/High Fiber/NAS  Passing flatus: YES  Tolerating current diet: YES       Pain Management:   Patient states pain is manageable on current regimen: YES    Skin:  Braden Score: 22  Interventions: increase time out of bed    Patient Safety:  Fall Score: Total Score: 4  Interventions: pt to call before getting OOB  High Fall Risk: Yes    Length of Stay:  Expected LOS: 2d 12h  Actual LOS: 6      Corinna L Josselyn

## 2020-11-23 NOTE — Progress Notes (Signed)
0700: Bedside and Verbal shift change report given to Wilber Bihari, RN (oncoming nurse) by Ashley Akin (offgoing nurse). Report included the following information SBAR, Kardex, Intake/Output, MAR, Recent Results, and Cardiac Rhythm NSR .     0826: Pt complaining of anxiety. PRN Valium given.     2542: Pt complaining of pain 8/10 in back. PRN Dilaudid given.     1032: Pt reporting that she is still hurting after dilaudid. PRN flexeril and tylenol given.     1115: Pt appears to be having an anxiety attack. Pt is tearful and flushed in the face with increased respirations. Kardar,MD at bedside one time dose for 5 mg valium received.      1122: 5mg  Valium given for anxiety attack.     1257: Pt complaining of back pain, 8/10. PRN Dilaudid given.     1435: Pt complaining of anxiety, PRN valium given.     1655: Pt complaining of 8/10 back pain, PRN dilaudid given.     1900:   End of Shift Note    Bedside shift change report given to Corrina,RN (oncoming nurse) by , RN (offgoing nurse).  Report included the following information SBAR, Kardex, Intake/Output, MAR, Recent Results, and Cardiac Rhythm NSR     Shift worked:  [id number]     Shift summary and any significant changes:     See above    Possible D/C tomorrow   Concerns for physician to address:  none     Zone phone for oncoming shift:          Activity:  Activity Level: Up with Assistance  Number times ambulated in hallways past shift: 0  Number of times OOB to chair past shift: 3    Cardiac:   Cardiac Monitoring: Yes      Cardiac Rhythm: Sinus Rhythm    Access:  Current line(s): PIV     Genitourinary:   Urinary status: voiding    Respiratory:   O2 Device: None (Room air)  Chronic home O2 use?: NO  Incentive spirometer at bedside: YES       GI:  Last Bowel Movement Date: 11/22/20  Current diet:  ADULT DIET Regular; Low Fat/Low Chol/High Fiber/NAS  Passing flatus: YES  Tolerating current diet: YES       Pain Management:   Patient states pain is manageable  on current regimen: YES    Skin:  Braden Score: 21  Interventions: increase time out of bed, PT/OT consult, and limit briefs    Patient Safety:  Fall Score: Total Score: 3  Interventions: bed/chair alarm, assistive device (walker, cane, etc), gripper socks, pt to call before getting OOB, and stay with me (per policy)  High Fall Risk: Yes    Length of Stay:  Expected LOS: 2d 12h  Actual LOS: 6      11/24/20, RN

## 2020-11-24 LAB — BASIC METABOLIC PANEL
Anion Gap: 6 mmol/L (ref 5–15)
BUN: 14 MG/DL (ref 6–20)
Bun/Cre Ratio: 16 (ref 12–20)
CO2: 28 mmol/L (ref 21–32)
Calcium: 8.7 MG/DL (ref 8.5–10.1)
Chloride: 102 mmol/L (ref 97–108)
Creatinine: 0.85 MG/DL (ref 0.55–1.02)
ESTIMATED GLOMERULAR FILTRATION RATE: >60 mL/min/{1.73_m2} (ref >60)
Glucose: 282 mg/dL — ABNORMAL HIGH (ref 65–100)
Potassium: 4.4 mmol/L (ref 3.5–5.1)
Sodium: 136 mmol/L (ref 136–145)

## 2020-11-24 LAB — CBC
Hematocrit: 38.1 % (ref 35.0–47.0)
Hemoglobin: 12.7 g/dL (ref 11.5–16.0)
MCH: 32.9 PG (ref 26.0–34.0)
MCHC: 33.3 g/dL (ref 30.0–36.5)
MCV: 98.7 FL (ref 80.0–99.0)
MPV: 10.2 FL (ref 8.9–12.9)
NRBC Absolute: 0 10*3/uL (ref 0.00–0.01)
Nucleated RBCs: 0 PER 100 WBC
Platelets: 327 10*3/uL (ref 150–400)
RBC: 3.86 M/uL (ref 3.80–5.20)
RDW: 12.5 % (ref 11.5–14.5)
WBC: 21 10*3/uL — ABNORMAL HIGH (ref 3.6–11.0)

## 2020-11-24 LAB — POCT GLUCOSE
POC Glucose: 169 mg/dL — ABNORMAL HIGH (ref 65–117)
POC Glucose: 219 mg/dL — ABNORMAL HIGH (ref 65–117)

## 2020-11-24 LAB — MAGNESIUM
Magnesium: 1.9 mg/dL (ref 1.6–2.4)
Magnesium: 1.9 mg/dL (ref 1.6–2.4)

## 2020-11-24 LAB — CBC W/O DIFF
ABSOLUTE NRBC: 0 10*3/uL (ref 0.00–0.01)
HCT: 38.1 % (ref 35.0–47.0)
HGB: 12.7 g/dL (ref 11.5–16.0)
MCH: 32.9 PG (ref 26.0–34.0)
MCHC: 33.3 g/dL (ref 30.0–36.5)
MCV: 98.7 FL (ref 80.0–99.0)
MPV: 10.2 FL (ref 8.9–12.9)
NRBC: 0 PER 100 WBC
PLATELET: 327 10*3/uL (ref 150–400)
RBC: 3.86 M/uL (ref 3.80–5.20)
RDW: 12.5 % (ref 11.5–14.5)
WBC: 21 10*3/uL — ABNORMAL HIGH (ref 3.6–11.0)

## 2020-11-24 LAB — METABOLIC PANEL, BASIC
Anion gap: 6 mmol/L (ref 5–15)
BUN/Creatinine ratio: 16 (ref 12–20)
BUN: 14 MG/DL (ref 6–20)
CO2: 28 mmol/L (ref 21–32)
Calcium: 8.7 MG/DL (ref 8.5–10.1)
Chloride: 102 mmol/L (ref 97–108)
Creatinine: 0.85 MG/DL (ref 0.55–1.02)
Glucose: 282 mg/dL — ABNORMAL HIGH (ref 65–100)
Potassium: 4.4 mmol/L (ref 3.5–5.1)
Sodium: 136 mmol/L (ref 136–145)
eGFR: >60 mL/min/{1.73_m2} (ref >60)

## 2020-11-24 LAB — GLUCOSE, POC
Glucose (POC): 169 mg/dL — ABNORMAL HIGH (ref 65–117)
Glucose (POC): 219 mg/dL — ABNORMAL HIGH (ref 65–117)

## 2020-11-24 MED ORDER — BLOOD GLUCOSE METER KIT
PACK | 0 refills | Status: AC
Start: 2020-11-24 — End: ?

## 2020-11-24 MED ORDER — GUAIFENESIN 600 MG TABLET,EXTENDED RELEASE BIPHASIC 12 HR
600 mg | ORAL_TABLET | Freq: Two times a day (BID) | ORAL | 0 refills | Status: AC
Start: 2020-11-24 — End: ?

## 2020-11-24 MED ORDER — L.ACIDOPH & PARACASEI-S.THERMOPHIL-BIFIDOBACTERIUM 16 BILLION CELL CAP
16 billion cell | ORAL_CAPSULE | Freq: Every day | ORAL | 0 refills | Status: AC
Start: 2020-11-24 — End: ?

## 2020-11-24 MED ORDER — AMOXICILLIN CLAVULANATE 875 MG-125 MG TAB
875-125 mg | ORAL_TABLET | Freq: Two times a day (BID) | ORAL | 0 refills | Status: AC
Start: 2020-11-24 — End: ?

## 2020-11-24 MED ORDER — NEBULIZER & COMPRESSOR
0 refills | Status: AC | PRN
Start: 2020-11-24 — End: ?

## 2020-11-24 MED ORDER — BLOOD GLUCOSE TEST STRIPS
ORAL_STRIP | 0 refills | Status: AC
Start: 2020-11-24 — End: ?

## 2020-11-24 MED ORDER — LANCETS
11 refills | Status: AC
Start: 2020-11-24 — End: ?

## 2020-11-24 MED ORDER — NICOTINE 14 MG/24 HR DAILY PATCH
14 mg/24 hr | MEDICATED_PATCH | Freq: Every day | TRANSDERMAL | 0 refills | Status: AC
Start: 2020-11-24 — End: 2020-12-25

## 2020-11-24 MED ORDER — HYDROMORPHONE 2 MG TAB
2 mg | ORAL_TABLET | ORAL | 0 refills | Status: AC | PRN
Start: 2020-11-24 — End: 2020-11-27

## 2020-11-24 MED ORDER — ACETAMINOPHEN 325 MG TABLET
325 mg | ORAL_TABLET | Freq: Four times a day (QID) | ORAL | 0 refills | Status: AC | PRN
Start: 2020-11-24 — End: ?

## 2020-11-24 MED ORDER — IPRATROPIUM-ALBUTEROL 2.5 MG-0.5 MG/3 ML NEB SOLUTION
2.5 mg-0.5 mg/3 ml | RESPIRATORY_TRACT | 0 refills | Status: AC | PRN
Start: 2020-11-24 — End: ?

## 2020-11-24 MED ORDER — LIDOCAINE 4 % TOPICAL PATCH (12 HOUR DURATION)
4 % | CUTANEOUS | 0 refills | Status: AC
Start: 2020-11-24 — End: ?

## 2020-11-24 MED ORDER — CYCLOBENZAPRINE 10 MG TAB
10 mg | ORAL_TABLET | Freq: Three times a day (TID) | ORAL | 0 refills | Status: AC | PRN
Start: 2020-11-24 — End: ?

## 2020-11-24 MED ORDER — PREDNISONE 20 MG TAB
20 mg | ORAL_TABLET | ORAL | 0 refills | Status: AC
Start: 2020-11-24 — End: ?

## 2020-11-24 MED ORDER — INSULIN LISPRO 100 UNIT/ML INJECTION
100 unit/mL | SUBCUTANEOUS | 2 refills | Status: AC
Start: 2020-11-24 — End: ?

## 2020-11-24 MED ORDER — FENTANYL CITRATE (PF) 50 MCG/ML IJ SOLN
50 mcg/mL | Freq: Once | INTRAMUSCULAR | Status: DC
Start: 2020-11-24 — End: 2020-11-24

## 2020-11-24 MED FILL — SOLU-MEDROL (PF) 40 MG/ML SOLUTION FOR INJECTION: 40 mg/mL | INTRAMUSCULAR | Qty: 2

## 2020-11-24 MED FILL — DIAZEPAM 5 MG/ML SYRINGE: 5 mg/mL | INTRAMUSCULAR | Qty: 2

## 2020-11-24 MED FILL — HYDROMORPHONE (PF) 1 MG/ML IJ SOLN: 1 mg/mL | INTRAMUSCULAR | Qty: 1

## 2020-11-24 MED FILL — ENOXAPARIN 40 MG/0.4 ML SUB-Q SYRINGE: 40 mg/0.4 mL | SUBCUTANEOUS | Qty: 0.4

## 2020-11-24 MED FILL — IPRATROPIUM-ALBUTEROL 2.5 MG-0.5 MG/3 ML NEB SOLUTION: 2.5 mg-0.5 mg/3 ml | RESPIRATORY_TRACT | Qty: 3

## 2020-11-24 MED FILL — LIPITOR 20 MG TABLET: 20 mg | ORAL | Qty: 1

## 2020-11-24 MED FILL — MUCINEX 600 MG TABLET, EXTENDED RELEASE: 600 mg | ORAL | Qty: 1

## 2020-11-24 MED FILL — CYCLOBENZAPRINE 10 MG TAB: 10 mg | ORAL | Qty: 1

## 2020-11-24 MED FILL — INSULIN LISPRO 100 UNIT/ML INJECTION: 100 unit/mL | SUBCUTANEOUS | Qty: 1

## 2020-11-24 MED FILL — ONDANSETRON (PF) 4 MG/2 ML INJECTION: 4 mg/2 mL | INTRAMUSCULAR | Qty: 2

## 2020-11-24 MED FILL — AMPHETAMINE-DEXTROAMPHETAMINE 10 MG TAB: 10 mg | ORAL | Qty: 4

## 2020-11-24 MED FILL — RISAQUAD 8 BILLION CELL CAPSULE: 8 billion cell | ORAL | Qty: 1

## 2020-11-24 MED FILL — NICOTINE 14 MG/24 HR DAILY PATCH: 14 mg/24 hr | TRANSDERMAL | Qty: 1

## 2020-11-24 MED FILL — SALONPAS (LIDOCAINE) 4 % TOPICAL PATCH: 4 % | CUTANEOUS | Qty: 1

## 2020-11-24 NOTE — Progress Notes (Signed)
Hospital follow-up PCP transitional care appointment has been scheduled with NP Lenox Ahr on 11/25/20 at 1140. Pending patient discharge.  Darrelyn Hillock, Care Management Assistant

## 2020-11-24 NOTE — Progress Notes (Signed)
End of Shift Note    Bedside shift change report given to [employer] (oncoming nurse) by Marchelle Gearing (offgoing nurse).  Report included the following information SBAR and Kardex    10/23 @ 1930:  Pt rang out requesting PRN valium for anxiety  2110:  Pt reporting pain 8/10.  Requesting PRN dilaudid. She also requested flexeril for muscle pain.       10/24@ 0100:  Pt rang out requesting PRN dilaudid for pain 8/10.  She requested valium as well.  Educated her about oversedation and advised her to stagger the meds.   0220:  Pt rang out requesting valium stating she is very anxious.    0330:  Pt vomited.  She states she was coughing and it triggered her gag reflex.    0500:  Pt rang out requesting PRN dilaudid.              Shift worked:  1900-0700     Shift summary and any significant changes:    See above   Concerns for physician to address:    Zone phone for oncoming shift:       Activity:  Activity Level: Up with Assistance  Number times ambulated in hallways past shift: 0  Number of times OOB to chair past shift: 0    Cardiac:   Cardiac Monitoring: Yes      Cardiac Rhythm: Sinus Rhythm    Access:  Current line(s): PIV     Genitourinary:   Urinary status: voiding    Respiratory:   O2 Device: None (Room air)  Chronic home O2 use?: NO  Incentive spirometer at bedside: YES       GI:  Last Bowel Movement Date: 11/22/20  Current diet:  ADULT DIET Regular; Low Fat/Low Chol/High Fiber/NAS  Passing flatus: YES  Tolerating current diet: YES       Pain Management:   Patient states pain is manageable on current regimen: YES    Skin:  Braden Score: 21  Interventions: increase time out of bed    Patient Safety:  Fall Score: Total Score: 3  Interventions: pt to call before getting OOB  High Fall Risk: Yes    Length of Stay:  Expected LOS: 2d 12h  Actual LOS: 7      Corinna L Josselyn

## 2020-11-24 NOTE — Progress Notes (Signed)
Problem: Falls - Risk of  Goal: *Absence of Falls  Description: Document Christina Stevens Fall Risk and appropriate interventions in the flowsheet.  Outcome: Progressing Towards Goal  Note: Fall Risk Interventions:  Mobility Interventions: Bed/chair exit alarm, Patient to call before getting OOB         Medication Interventions: Bed/chair exit alarm, Patient to call before getting OOB, Teach patient to arise slowly    Elimination Interventions: Call light in reach    History of Falls Interventions: Bed/chair exit alarm, Evaluate medications/consider consulting pharmacy, Investigate reason for fall, Room close to nurse's station         Problem: Patient Education: Go to Patient Education Activity  Goal: Patient/Family Education  Outcome: Progressing Towards Goal     Problem: Pain  Goal: *Control of Pain  Outcome: Progressing Towards Goal  Goal: *PALLIATIVE CARE:  Alleviation of Pain  Outcome: Progressing Towards Goal     Problem: Patient Education: Go to Patient Education Activity  Goal: Patient/Family Education  Outcome: Progressing Towards Goal     Problem: Patient Education: Go to Patient Education Activity  Goal: Patient/Family Education  Outcome: Progressing Towards Goal     Problem: Patient Education: Go to Patient Education Activity  Goal: Patient/Family Education  Outcome: Progressing Towards Goal     Problem: Pneumonia: Day 4  Goal: Off Pathway (Use only if patient is Off Pathway)  Outcome: Progressing Towards Goal  Goal: Activity/Safety  Outcome: Progressing Towards Goal  Goal: Nutrition/Diet  Outcome: Progressing Towards Goal  Goal: Discharge Planning  Outcome: Progressing Towards Goal  Goal: Medications  Outcome: Progressing Towards Goal  Goal: Respiratory  Outcome: Progressing Towards Goal  Goal: Treatments/Interventions/Procedures  Outcome: Progressing Towards Goal  Goal: Psychosocial  Outcome: Progressing Towards Goal     Problem: Pneumonia: Discharge Outcomes  Goal: *Demonstrates progressive  activity  Outcome: Progressing Towards Goal  Goal: *Describes follow-up/return visits to physicians  Outcome: Progressing Towards Goal  Goal: *Tolerating diet  Outcome: Progressing Towards Goal  Goal: *Verbalizes name, dosage, time, side effects, and number of days to continue medications  Outcome: Progressing Towards Goal  Goal: *Influenza immunization  Outcome: Progressing Towards Goal  Goal: *Pneumococcal immunization  Outcome: Progressing Towards Goal  Goal: *Respiratory status at baseline  Outcome: Progressing Towards Goal  Goal: *Vital signs within defined limits  Outcome: Progressing Towards Goal  Goal: *Describes available resources and support systems  Outcome: Progressing Towards Goal  Goal: *Optimal pain control at patient's stated goal  Outcome: Progressing Towards Goal     Problem: Patient Education: Go to Patient Education Activity  Goal: Patient/Family Education  Outcome: Progressing Towards Goal     Problem: Risk for Spread of Infection  Goal: Prevent transmission of infectious organism to others  Description: Prevent the transmission of infectious organisms to other patients, staff members, and visitors.  Outcome: Progressing Towards Goal     Problem: Patient Education:  Go to Education Activity  Goal: Patient/Family Education  Outcome: Progressing Towards Goal

## 2020-11-24 NOTE — Progress Notes (Signed)
Received notification from bedside RN about patient with regards to: pain and anxiety leading to elevated BP  VS: BP 161/111, HR 59, RR 12, O2 sat 92% on RA    Intervention given: Fentanyl 50 mg IV x 1 dose ordered

## 2020-11-24 NOTE — Progress Notes (Signed)
Home Oxygen Test  Date of test: 11/24/20  Time of test: 0900    Sa02 95 % on room air AT REST.  Sa02 92 % on room air DURING AMBULATION.    Wilber Bihari, RN

## 2020-11-24 NOTE — Consults (Signed)
Consult received today for hypoxia.  Patient is on room air and already has discharge orders.  Nothing to add at this time.

## 2020-11-24 NOTE — Progress Notes (Signed)
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                If yes, was [employer] transfer form completed and [facility] notified?   Caregiver Contact:  SpouseLejla Moeser [phone]  Discharge Caregiver contacted prior to discharge? If pt desires  Care Conference needed?:     n/a    Pt cleared for d/c from CM standpoint.    CN discussed d/c plan with pt at bedside.. Pt verbalized understanding.    Care Management Interventions  PCP Verified by CM: Yes  Palliative Care Criteria Met (RRAT>21 & CHF Dx)?: No  Mode of Transport at Discharge: Other (see comment)  Transition of Care Consult (CM Consult): Discharge Planning  MyChart Signup: No  Discharge Durable Medical Equipment: No  Health Maintenance Reviewed: Yes  Physical Therapy Consult: Yes  Occupational Therapy Consult: Yes  Speech Therapy Consult: No  Support Systems: Spouse/Significant Other  Confirm Follow Up Transport: Family  [employer] Information Provided?: No  Discharge Location  Patient Expects to be Discharged to:: Home with family assistance      [facility]  Phone: [phone]

## 2020-11-24 NOTE — Discharge Summary (Signed)
Discharge Summary by Selinda Michaels, MD at 11/24/20 [id number]                Author: Selinda Michaels, MD  Service: Internal Medicine  Author Type: Physician       Filed: 11/24/20 0856  Date of Service: 11/24/20 0841  Status: Addendum          Editor: Selinda Michaels, MD (Physician)          Related Notes: Original Note by Selinda Michaels, MD (Physician) filed at 11/24/20  [phone]                                      Hospitalist Discharge Summary        Patient ID:   Christina Stevens   [id number]   40 y.o.   Jan 29, 1981   11/17/2020      PCP on record: Sherley Bounds, NP      Admit date: 11/17/2020   Discharge date and time: 11/24/2020      DISCHARGE DIAGNOSIS:      Acute respiratory failure with hypoxia secondary to bilateral pneumonia/COPD/RSV infection/hyperglycemia secondary to iatrogenic causes due to systemic steroid use/current smoker/hyperlipidemia/history of ADHD/history of PCOS      CONSULTATIONS:   IP CONSULT TO PULMONOLOGY      Excerpted HPI from H&P of Girard Cooter, MD:   Sephira Zellman is a 40 y.o. female with past medical history of ADHD, hypercholesterolemia, PCOS on medical marijuana presented with cough and shortness of breath for last 5 days.  She started having flulike symptoms 5 days back, started with cough  and congestion.  She has some phlegm with cough.  She also has some chest pain associated with cough and breathing.  She has worsening shortness of breath for last few days.  She has generalized weakness and her legs feel like Jell-O.  She also had  some urinary incontinence for last 2 days.  She has been having multiple loose bowel movements.  She denies any belly pain. She also has some lower and mid back pain. She couldn't get up today and almost collapsed down to the floor d/t her generalized  weakness. She also had some ear pain + notice some fluids draining from left ear.  On my evaluation she is in mild distress, has bouts of cough.  She  is on room air maintaining oxygen saturation.       We were asked to admit for work up and evaluation of the above problems.       ______________________________________________________________________   DISCHARGE SUMMARY/HOSPITAL COURSE:   for full details see H&P, daily progress notes, labs, consult notes.    Patient was subsequently admitted to [facility] for further evaluation as well as management, patient may need continuous telemetry monitoring, patient required supplemental  oxygen, initially was on high flow, started on high-dose systemic steroids, patient was started on antibiotics, diagnosed with RSV infection, overall patient's clinical status improved, patient's oxygen requirements down trended, subsequently which is  patient's clinical status improved and after evaluation by physical therapy as well as Occupational Therapy patient was deemed stable for discharge on oral steroids as well as oral antibiotics with close outpatient follow-up with primary care physician  as well as pulmonology, of note patient did have hyperglycemia secondary to systemic steroids and was prescribed insulin for short period, the plan  was explained to the patient in details agreeable to current plan.               _______________________________________________________________________   Patient seen and examined by me on discharge day.   Pertinent Findings:   Gen:    Not in distress   Chest: Decreased air entry bilaterally in bilateral lower lung zones   CVS:   Regular rhythm, s1/s2 no m/r/g  No edema   Abd:  Soft, not distended, not tender   Neuro:  Alert, Oriented x 4   _______________________________________________________________________   DISCHARGE MEDICATIONS:      Current Discharge Medication List                 START taking these medications          Details        acetaminophen (TYLENOL) 325 mg tablet  Take 2 Tablets by mouth every six (6) hours as needed for Pain or Fever.   Qty: 60 Tablet,  Refills: 0   Start date: 11/24/2020               albuterol-ipratropium (DUO-NEB) 2.5 mg-0.5 mg/3 ml nebu  3 mL by Nebulization route every four (4) hours as needed for Wheezing.   Qty: 300 mL, Refills: 0   Start date: 11/24/2020               Nebulizer & Compressor machine  1 Each by Does Not Apply route every four (4) hours as needed for Wheezing or Shortness of Breath.   Qty: 1 Each, Refills: 0   Start date: 11/24/2020               cyclobenzaprine (FLEXERIL) 10 mg tablet  Take 1 Tablet by mouth three (3) times daily as needed for Muscle Spasm(s).   Qty: 15 Tablet, Refills: 0   Start date: 11/24/2020               nicotine (NICODERM CQ) 14 mg/24 hr patch  1 Patch by TransDERmal route daily for 30 days.   Qty: 30 Patch, Refills: 0   Start date: 11/25/2020, End date: 12/25/2020               lidocaine 4 % patch  Use one patch daily in affected area   Qty: 15 Each, Refills: 0   Start date: 11/25/2020               insulin lispro (HUMALOG) 100 unit/mL injection  INITIATE CORRECTIVE INSULIN PROTOCOL (RIC): RX RIC Normal Sensitivity (Average weight) AC (before meals), Q6H, CORRECTIONAL SCALE only For Blood Sugar (mg/dl)  of :           140-199=2 units          200-249=3 units 250-299=5 units 300-349=7 units 350 or greater = Call MD Give in addition to basal medications. Do Not Hold for NPO BEDTIME CORRECTIONAL sliding scale when scheduled: 200-249=2 units 250-299=3 units  300-349=4 units 350 or greater = Call MD Give in addition to basal medications. Do Not Hold for NPO Fast Acting - Administer Immediately - or within 15 minutes of start of meal, if mealtime coverage.   Qty: 1 Each, Refills: 2   Start date: 11/24/2020               lancets misc  [facility]   Qty: 1 Each, Refills: 11   Start date: 11/24/2020               Blood-Glucose  Meter monitoring kit  [facility]   Qty: 1 Kit, Refills: 0   Start date: 11/24/2020               glucose blood VI test strips (blood glucose test) strip  [facility]   Qty: 200 Strip, Refills: 0   Start  date: 11/24/2020               guaiFENesin ER (MUCINEX) 600 mg ER tablet  Take 1 Tablet by mouth every twelve (12) hours.   Qty: 30 Tablet, Refills: 0   Start date: 11/24/2020               amoxicillin-clavulanate (Augmentin) 875-125 mg per tablet  Take 1 Tablet by mouth two (2) times a day.   Qty: 10 Tablet, Refills: 0   Start date: 11/24/2020               predniSONE (DELTASONE) 20 mg tablet  Take 1 tablet twice daily for 10 days, then take 1 tablet once daily for 10 days   Qty: 30 Tablet, Refills: 0   Start date: 11/24/2020                        CONTINUE these medications which have CHANGED          Details        L.acidoph & parac-S.therm-Bifido (FLORA Q2/RISAQUAD-2) 16 billion cell cap cap  Take 1 Capsule by mouth daily.   Qty: 30 Capsule, Refills: 0   Start date: 11/24/2020               HYDROmorphone (Dilaudid) 2 mg tablet  Take 1 Tablet by mouth every four (4) hours as needed for Pain for up to 3 days. Max Daily Amount: 12 mg.   Qty: 18 Tablet, Refills: 0   Start date: 11/24/2020, End date: 11/27/2020          Associated Diagnoses: Community acquired pneumonia, unspecified laterality                        CONTINUE these medications which have NOT CHANGED          Details        atorvastatin (LIPITOR) 20 mg tablet  Take 20 mg by mouth daily.               dextroamphetamine-amphetamine (ADDERALL) 20 mg tablet  Take 40 mg by mouth daily.                        STOP taking these medications                  ondansetron hcl (ZOFRAN) 4 mg tablet  Comments:    Reason for Stopping:                      nystatin (MYCOSTATIN) 100,000 unit/mL suspension  Comments:    Reason for Stopping:                      senna-docusate (PERICOLACE) 8.6-50 mg per tablet  Comments:    Reason for Stopping:                                Patient Follow Up Instructions:    Activity: PT/OT per Home Health   Diet: Cardiac Diet  Wound Care: None needed      Follow-up with PCP/Pulmonology in 2 weeks.   Follow-up tests/labs As per above  physicians     Follow-up Information                  Follow up With  Specialties  Details  Why  Contact Info              Coggin, Nadara Mode, NP  Nurse Practitioner      [address]   [place] [address]   [phone]          Sherley Bounds, NP  Nurse Practitioner  Follow up in 2 week(s)    [address]   [place] [address]   [phone]          Sherley Bounds, NP  Nurse Practitioner      [place] [address]   [phone]                 Frazier Butt, MD  Pulmonary Disease  Follow up in 2 week(s)    [address]   [facility]   [place]   [place] [address]   [phone]                     ________________________________________________________________      Risk of deterioration: Low      Condition at Discharge:  Stable   __________________________________________________________________      Disposition   Home with family and home health services      ____________________________________________________________________      Code Status: Full Code   ___________________________________________________________________         Total time in minutes spent coordinating this discharge (includes going over instructions, follow-up, prescriptions, and preparing report for sign off to her PCP) :  45 minutes      Signed:   Ramiah Helfrich Ned Card, MD

## 2020-11-24 NOTE — Progress Notes (Signed)
0700: Bedside and Verbal shift change report given to Wilber Bihari, RN (oncoming nurse) by Ashley Akin (offgoing nurse). Report included the following information SBAR, Kardex, Intake/Output, MAR, Recent Results, and Cardiac Rhythm NSR .     1100: DISCHARGE SUMMARY FROM [facility] NURSE    The patient is stable for discharge. I have reviewed the discharge instructions with the patient. The patient verbalized understanding. All questions were fully answered. The patient verbalized no complaints.    Hard scripts and medication handouts were given and reviewed with the patient. Appropriate educational materials and medication side effects teaching were also provided.    Cardiac monitor and IV line(s) were removed.     The following personal items collected during admission were returned to the patient/family  Home medications: none  Dental Appliance: Dental Appliances: None  Vision: Visual Aid: None  Hearing Aid:    Jewelry: Jewelry: None  Clothing: Clothing: At bedside  Other Valuables: Other Valuables: Cell Phone  Valuables sent to safe:      There were no personal belongings, valuables or home medications left at patient's bedside, server or safe.     Wilber Bihari, RN

## 2021-03-01 ENCOUNTER — Emergency Department: Admit: 2021-03-01 | Payer: PRIVATE HEALTH INSURANCE | Primary: Primary Care

## 2021-03-01 ENCOUNTER — Inpatient Hospital Stay
Admit: 2021-03-01 | Discharge: 2021-03-01 | Disposition: A | Discharge status: Home / Self Care | Payer: PRIVATE HEALTH INSURANCE | Attending: Emergency Medicine

## 2021-03-01 DIAGNOSIS — J189 Pneumonia, unspecified organism: Secondary | ICD-10-CM

## 2021-03-01 LAB — CBC WITH AUTO DIFFERENTIAL
Basophils %: 1 % (ref 0–1)
Basophils Absolute: 0.1 10*3/uL (ref 0.0–0.1)
Eosinophils %: 3 % (ref 0–7)
Eosinophils Absolute: 0.4 10*3/uL (ref 0.0–0.4)
Granulocyte Absolute Count: 0 10*3/uL (ref 0.00–0.04)
Hematocrit: 43.8 % (ref 35.0–47.0)
Hemoglobin: 15.2 g/dL (ref 11.5–16.0)
Immature Granulocytes: 0 % (ref 0.0–0.5)
Lymphocytes %: 8 % — ABNORMAL LOW (ref 12–49)
Lymphocytes Absolute: 1 10*3/uL (ref 0.8–3.5)
MCH: 32.7 PG (ref 26.0–34.0)
MCHC: 34.7 g/dL (ref 30.0–36.5)
MCV: 94.2 FL (ref 80.0–99.0)
MPV: 10.2 FL (ref 8.9–12.9)
Monocytes %: 7 % (ref 5–13)
Monocytes Absolute: 0.9 10*3/uL (ref 0.0–1.0)
NRBC Absolute: 0 10*3/uL (ref 0.00–0.01)
Neutrophils %: 81 % — ABNORMAL HIGH (ref 32–75)
Neutrophils Absolute: 9.7 10*3/uL — ABNORMAL HIGH (ref 1.8–8.0)
Nucleated RBCs: 0 PER 100 WBC
Platelets: 263 10*3/uL (ref 150–400)
RBC: 4.65 M/uL (ref 3.80–5.20)
RDW: 12.4 % (ref 11.5–14.5)
WBC: 12.1 10*3/uL — ABNORMAL HIGH (ref 3.6–11.0)

## 2021-03-01 LAB — COMPREHENSIVE METABOLIC PANEL
ALT: 19 U/L (ref 12–78)
AST: 14 U/L — ABNORMAL LOW (ref 15–37)
Albumin/Globulin Ratio: 1.1 (ref 1.1–2.2)
Albumin: 3.8 g/dL (ref 3.5–5.0)
Alkaline Phosphatase: 74 U/L (ref 45–117)
Anion Gap: 8 mmol/L (ref 5–15)
BUN: 7 MG/DL (ref 6–20)
Bun/Cre Ratio: 8 — ABNORMAL LOW (ref 12–20)
CO2: 23 mmol/L (ref 21–32)
Calcium: 8.6 MG/DL (ref 8.5–10.1)
Chloride: 108 mmol/L (ref 97–108)
Creatinine: 0.85 MG/DL (ref 0.55–1.02)
ESTIMATED GLOMERULAR FILTRATION RATE: >60 mL/min/{1.73_m2} (ref >60)
Globulin: 3.5 g/dL (ref 2.0–4.0)
Glucose: 112 mg/dL — ABNORMAL HIGH (ref 65–100)
Potassium: 3.7 mmol/L (ref 3.5–5.1)
Sodium: 139 mmol/L (ref 136–145)
Total Bilirubin: 0.4 MG/DL (ref 0.2–1.0)
Total Protein: 7.3 g/dL (ref 6.4–8.2)

## 2021-03-01 LAB — COVID-19, RAPID: SARS-CoV-2, Rapid: NOT DETECTED

## 2021-03-01 LAB — TROPONIN, HIGH SENSITIVITY: Troponin, High Sensitivity: <4 ng/L (ref 0–51)

## 2021-03-01 LAB — PROBNP, N-TERMINAL: BNP: 33 PG/ML (ref <125)

## 2021-03-01 LAB — INFLUENZA A+B VIRAL AGS
Flu A Antigen: NEGATIVE
Influenza A Antigen: NEGATIVE
Influenza B Antigen: NEGATIVE
Influenza B Antigen: NEGATIVE

## 2021-03-01 LAB — METABOLIC PANEL, COMPREHENSIVE
A-G Ratio: 1.1 (ref 1.1–2.2)
ALT (SGPT): 19 U/L (ref 12–78)
AST (SGOT): 14 U/L — ABNORMAL LOW (ref 15–37)
Albumin: 3.8 g/dL (ref 3.5–5.0)
Alk. phosphatase: 74 U/L (ref 45–117)
Anion gap: 8 mmol/L (ref 5–15)
BUN/Creatinine ratio: 8 — ABNORMAL LOW (ref 12–20)
BUN: 7 MG/DL (ref 6–20)
Bilirubin, total: 0.4 MG/DL (ref 0.2–1.0)
CO2: 23 mmol/L (ref 21–32)
Calcium: 8.6 MG/DL (ref 8.5–10.1)
Chloride: 108 mmol/L (ref 97–108)
Creatinine: 0.85 MG/DL (ref 0.55–1.02)
Globulin: 3.5 g/dL (ref 2.0–4.0)
Glucose: 112 mg/dL — ABNORMAL HIGH (ref 65–100)
Potassium: 3.7 mmol/L (ref 3.5–5.1)
Protein, total: 7.3 g/dL (ref 6.4–8.2)
Sodium: 139 mmol/L (ref 136–145)
eGFR: >60 mL/min/{1.73_m2} (ref >60)

## 2021-03-01 LAB — CBC WITH AUTOMATED DIFF
ABS. BASOPHILS: 0.1 10*3/uL (ref 0.0–0.1)
ABS. EOSINOPHILS: 0.4 10*3/uL (ref 0.0–0.4)
ABS. IMM. GRANS.: 0 10*3/uL (ref 0.00–0.04)
ABS. LYMPHOCYTES: 1 10*3/uL (ref 0.8–3.5)
ABS. MONOCYTES: 0.9 10*3/uL (ref 0.0–1.0)
ABS. NEUTROPHILS: 9.7 10*3/uL — ABNORMAL HIGH (ref 1.8–8.0)
ABSOLUTE NRBC: 0 10*3/uL (ref 0.00–0.01)
BASOPHILS: 1 % (ref 0–1)
EOSINOPHILS: 3 % (ref 0–7)
HCT: 43.8 % (ref 35.0–47.0)
HGB: 15.2 g/dL (ref 11.5–16.0)
IMMATURE GRANULOCYTES: 0 % (ref 0.0–0.5)
LYMPHOCYTES: 8 % — ABNORMAL LOW (ref 12–49)
MCH: 32.7 PG (ref 26.0–34.0)
MCHC: 34.7 g/dL (ref 30.0–36.5)
MCV: 94.2 FL (ref 80.0–99.0)
MONOCYTES: 7 % (ref 5–13)
MPV: 10.2 FL (ref 8.9–12.9)
NEUTROPHILS: 81 % — ABNORMAL HIGH (ref 32–75)
NRBC: 0 PER 100 WBC
PLATELET: 263 10*3/uL (ref 150–400)
RBC: 4.65 M/uL (ref 3.80–5.20)
RDW: 12.4 % (ref 11.5–14.5)
WBC: 12.1 10*3/uL — ABNORMAL HIGH (ref 3.6–11.0)

## 2021-03-01 LAB — TROPONIN-HIGH SENSITIVITY: Troponin-High Sensitivity: <4 ng/L (ref 0–51)

## 2021-03-01 LAB — COVID-19 RAPID TEST: COVID-19 rapid test: NOT DETECTED

## 2021-03-01 LAB — NT-PRO BNP: NT pro-BNP: 33 PG/ML (ref <125)

## 2021-03-01 MED ORDER — IPRATROPIUM-ALBUTEROL 2.5 MG-0.5 MG/3 ML NEB SOLUTION
2.5 mg-0.5 mg/3 ml | RESPIRATORY_TRACT | Status: DC
Start: 2021-03-01 — End: 2021-03-01
  Administered 2021-03-01: 17:00:00

## 2021-03-01 MED ORDER — IPRATROPIUM-ALBUTEROL 2.5 MG-0.5 MG/3 ML NEB SOLUTION
2.5 mg-0.5 mg/3 ml | Freq: Once | RESPIRATORY_TRACT | Status: AC
Start: 2021-03-01 — End: 2021-03-01
  Administered 2021-03-01: 16:00:00 via RESPIRATORY_TRACT

## 2021-03-01 MED ORDER — ALBUTEROL SULFATE HFA 90 MCG/ACTUATION AEROSOL INHALER
90 mcg/actuation | RESPIRATORY_TRACT | Status: AC
Start: 2021-03-01 — End: 2021-03-01
  Administered 2021-03-01: 14:00:00 via RESPIRATORY_TRACT

## 2021-03-01 MED ORDER — KETOROLAC TROMETHAMINE 30 MG/ML INJECTION
30 mg/mL (1 mL) | INTRAMUSCULAR | Status: AC
Start: 2021-03-01 — End: 2021-03-01
  Administered 2021-03-01: 17:00:00 via INTRAVENOUS

## 2021-03-01 MED ORDER — AMOXICILLIN CLAVULANATE 875 MG-125 MG TAB
875-125 mg | ORAL_TABLET | Freq: Two times a day (BID) | ORAL | 0 refills | Status: AC
Start: 2021-03-01 — End: 2021-03-11

## 2021-03-01 MED ORDER — METHYLPREDNISOLONE (PF) 125 MG/2 ML IJ SOLR
125 mg/2 mL | INTRAMUSCULAR | Status: AC
Start: 2021-03-01 — End: 2021-03-01
  Administered 2021-03-01: 14:00:00 via INTRAVENOUS

## 2021-03-01 MED ORDER — AMOXICILLIN CLAVULANATE 875 MG-125 MG TAB
875-125 mg | ORAL | Status: AC
Start: 2021-03-01 — End: 2021-03-01
  Administered 2021-03-01: 19:00:00 via ORAL

## 2021-03-01 MED ORDER — PREDNISONE 20 MG TAB
20 mg | ORAL_TABLET | Freq: Every day | ORAL | 0 refills | Status: AC
Start: 2021-03-01 — End: 2021-03-06

## 2021-03-01 MED FILL — IPRATROPIUM-ALBUTEROL 2.5 MG-0.5 MG/3 ML NEB SOLUTION: 2.5 mg-0.5 mg/3 ml | RESPIRATORY_TRACT | Qty: 3

## 2021-03-01 MED FILL — KETOROLAC TROMETHAMINE 30 MG/ML INJECTION: 30 mg/mL (1 mL) | INTRAMUSCULAR | Qty: 1

## 2021-03-01 MED FILL — SOLU-MEDROL (PF) 125 MG/2 ML SOLUTION FOR INJECTION: 125 mg/2 mL | INTRAMUSCULAR | Qty: 2

## 2021-03-01 MED FILL — AMOXICILLIN CLAVULANATE 875 MG-125 MG TAB: 875-125 mg | ORAL | Qty: 1

## 2021-03-01 MED FILL — ALBUTEROL SULFATE HFA 90 MCG/ACTUATION AEROSOL INHALER: 90 mcg/actuation | RESPIRATORY_TRACT | Qty: 8.5

## 2021-03-01 NOTE — ED Notes (Signed)
Pt adds that it hurts in her chest to cough

## 2021-03-01 NOTE — ED Provider Notes (Signed)
ED Provider Notes by Amel Gianino, Maxcine Ham, MD at 03/01/21 5784                Author: Lezlie Lye, MD  Service: Emergency Medicine  Author Type: Physician       Filed: 03/02/21 1008  Date of Service: 03/01/21 0957  Status: Signed          Editor: Neri Samek, Maxcine Ham, MD (Physician)            Procedure Orders        1. EKG [[id number]] ordered by Derin Granquist, Maxcine Ham, MD                              [facility]  [place]            Pt Name: Christina Stevens   MRN: [id number]   [place] [birth year]   Date of evaluation: 03/01/2021   Provider: Lezlie Lye, MD    PCP: Sherley Bounds, NP   Note Started: 9:57 AM 03/01/21         CHIEF COMPLAINT            Chief Complaint       Patient presents with        ?  Shortness of Breath             Pt has had worsening short of breath for two days that feels the same as pneumonia in October; pt is working to breath ; pt can complete full sentences              HISTORY OF PRESENT ILLNESS: 1 or more elements         History From: patient, History limited by: none       Christina Stevens is a 41 y.o. female who presents with a chief complaint of shortness of breath and wheezing ongoing and progressively worsening for the last 2 to 3 days.  Also had nausea, vomiting, and diarrhea.  States she has chest pain with coughing.   Is having difficulty sleeping secondary to her cough       Nursing Notes were all reviewed and agreed with or any disagreements were addressed in the HPI.         REVIEW OF SYSTEMS            Positives and Pertinent negatives as per HPI.        PAST HISTORY        Past Medical History:     Past Medical History:        Diagnosis  Date         ?  ADHD       ?  [facility] Patient infection       ?  MRSA (methicillin resistant staph aureus) culture positive       ?  MS (congenital mitral stenosis)           ?  PCOS (polycystic ovarian syndrome)             Past Surgical History:     Past Surgical History:         Procedure   Laterality  Date          ?  HX APPENDECTOMY              ?  IR CHOLECYSTOSTOMY PERCUTANEOUS  Family History:   No family history on file.      Social History:     Social History          Tobacco Use         ?  Smoking status:  Every Day     ?  Smokeless tobacco:  Never       Substance Use Topics         ?  Alcohol use:  No     ?  Drug use:  Yes              Types:  Marijuana           Allergies:     Allergies        Allergen  Reactions         ?  Percocet [Oxycodone-Acetaminophen]  Itching             CURRENT MEDICATIONS           Discharge Medication List as of 03/01/2021  1:24 PM                 CONTINUE these medications which have NOT CHANGED          Details        L.acidoph & parac-S.therm-Bifido (FLORA Q2/RISAQUAD-2) 16 billion cell cap cap  Take 1 Capsule by mouth daily., Normal, Disp-30 Capsule, R-0               acetaminophen (TYLENOL) 325 mg tablet  Take 2 Tablets by mouth every six (6) hours as needed for Pain or Fever., Normal, Disp-60 Tablet, R-0               albuterol-ipratropium (DUO-NEB) 2.5 mg-0.5 mg/3 ml nebu  3 mL by Nebulization route every four (4) hours as needed for Wheezing., Normal, Disp-300 mL, R-0               Nebulizer & Compressor machine  1 Each by Does Not Apply route every four (4) hours as needed for Wheezing or Shortness of Breath., Normal, Disp-1 Each, R-0               cyclobenzaprine (FLEXERIL) 10 mg tablet  Take 1 Tablet by mouth three (3) times daily as needed for Muscle Spasm(s)., Normal, Disp-15 Tablet, R-0               lidocaine 4 % patch  Use one patch daily in affected area, Normal, Disp-15 Each, R-0               insulin lispro (HUMALOG) 100 unit/mL injection  INITIATE CORRECTIVE INSULIN PROTOCOL (RIC): RX RIC Normal Sensitivity (Average weight) AC (before meals), Q6H, CORRECTIONAL SCALE only For Blood Sugar (mg/dl)  of :           140-199=2 units          200-249=3 units 250-299=5 units 300-349=7 units 350 or  greater = Call MD Give in addition to basal  medications. Do Not Hold for NPO BEDTIME CORRECTIONAL sliding scale when scheduled: 200-249=2 units 250-299=3 units  300-349=4 units 350 or greater = Call MD Give in addition to basal medications. Do Not Hold fo r NPO Fast Acting - Administer Immediately - or within 15 minutes of start of meal, if mealtime coverage., Normal, Disp-1 Each, R-2               lancets misc  ACHS, Normal, Disp-1 Each, R-11  Blood-Glucose Meter monitoring kit  ACHS, Normal, Disp-1 Kit, R-0               glucose blood VI test strips (blood glucose test) strip  ACHS, Normal, Disp-200 Strip, R-0               guaiFENesin ER (MUCINEX) 600 mg ER tablet  Take 1 Tablet by mouth every twelve (12) hours., Normal, Disp-30 Tablet, R-0               !! amoxicillin-clavulanate (Augmentin) 875-125 mg per tablet  Take 1 Tablet by mouth two (2) times a day., Normal, Disp-10 Tablet, R-0               !! predniSONE (DELTASONE) 20 mg tablet  Take 1 tablet twice daily for 10 days, then take 1 tablet once daily for 10 days, Normal, Disp-30 Tablet, R-0               atorvastatin (LIPITOR) 20 mg tablet  Take 20 mg by mouth daily., Historical Med               dextroamphetamine-amphetamine (ADDERALL) 20 mg tablet  Take 40 mg by mouth daily., Historical Med               !! - Potential duplicate medications found. Please discuss with provider.                    SCREENINGS                    No data recorded              PHYSICAL EXAM           ED Triage Vitals [03/01/21 0847]     ED Encounter Vitals Group           BP  111/86        Pulse (Heart Rate)  (!) 107        Resp Rate  30        Temp  98.2 ??F (36.8 ??C)        Temp src          O2 Sat (%)  99 %        Weight  173 lb 11.6 oz           Height              Physical Exam   Vitals and nursing note reviewed.    Constitutional:        General: She is not in acute distress.      Appearance: She is well-developed.    HENT:       Head: Normocephalic and atraumatic.    Eyes:       Conjunctiva/sclera:  Conjunctivae normal.       Pupils: Pupils are equal, round, and reactive to light.     Cardiovascular:       Rate and Rhythm: Normal rate and regular rhythm.    Pulmonary:       Effort: Tachypnea present. No respiratory distress.       Breath sounds: No stridor. Wheezing present.       Comments: Increased work of breathing with speaking    Abdominal:       General: There is no distension.       Palpations: Abdomen is soft.       Tenderness: There is no abdominal tenderness.  Musculoskeletal:          General: Normal range of motion.       Cervical back: Normal range of motion.    Skin:      General: Skin is warm and dry.    Neurological:       Mental Status: She is alert and oriented to person, place, and time.    Psychiatric:          Mood and Affect: Mood normal.             DIAGNOSTIC RESULTS     LABS:         Recent Results (from the past 12 hour(s))     EKG, 12 LEAD, INITIAL          Collection Time: 03/01/21  8:54 AM         Result  Value  Ref Range            Ventricular Rate  99  BPM       Atrial Rate  99  BPM       P-R Interval  144  ms       QRS Duration  90  ms       Q-T Interval  356  ms       QTC Calculation (Bezet)  456  ms       Calculated P Axis  75  degrees       Calculated R Axis  77  degrees       Calculated T Axis  55  degrees       Diagnosis                 Normal sinus rhythm   Possible Left atrial enlargement   Septal infarct , age undetermined   When compared with ECG of 20-Nov-2020 04:19,   Septal infarct is now present          CBC WITH AUTOMATED DIFF          Collection Time: 03/01/21  8:56 AM         Result  Value  Ref Range            WBC  12.1 (H)  3.6 - 11.0 K/uL       RBC  4.65  3.80 - 5.20 M/uL       HGB  15.2  11.5 - 16.0 g/dL       HCT  43.8  35.0 - 47.0 %       MCV  94.2  80.0 - 99.0 FL       MCH  32.7  26.0 - 34.0 PG       MCHC  34.7  30.0 - 36.5 g/dL       RDW  12.4  11.5 - 14.5 %       PLATELET  263  150 - 400 K/uL       MPV  10.2  8.9 - 12.9 FL       NRBC  0.0  0 PER 100  WBC       ABSOLUTE NRBC  0.00  0.00 - 0.01 K/uL       NEUTROPHILS  81 (H)  32 - 75 %       LYMPHOCYTES  8 (L)  12 - 49 %       MONOCYTES  7  5 - 13 %       EOSINOPHILS  3  0 - 7 %  BASOPHILS  1  0 - 1 %       IMMATURE GRANULOCYTES  0  0.0 - 0.5 %       ABS. NEUTROPHILS  9.7 (H)  1.8 - 8.0 K/UL       ABS. LYMPHOCYTES  1.0  0.8 - 3.5 K/UL       ABS. MONOCYTES  0.9  0.0 - 1.0 K/UL       ABS. EOSINOPHILS  0.4  0.0 - 0.4 K/UL       ABS. BASOPHILS  0.1  0.0 - 0.1 K/UL       ABS. IMM. GRANS.  0.0  0.00 - 0.04 K/UL       DF  AUTOMATED          METABOLIC PANEL, COMPREHENSIVE          Collection Time: 03/01/21  8:56 AM         Result  Value  Ref Range            Sodium  139  136 - 145 mmol/L       Potassium  3.7  3.5 - 5.1 mmol/L       Chloride  108  97 - 108 mmol/L       CO2  23  21 - 32 mmol/L       Anion gap  8  5 - 15 mmol/L       Glucose  112 (H)  65 - 100 mg/dL       BUN  7  6 - 20 MG/DL       Creatinine  0.85  0.55 - 1.02 MG/DL       BUN/Creatinine ratio  8 (L)  12 - 20         eGFR  >60  >60 ml/min/1.2m       Calcium  8.6  8.5 - 10.1 MG/DL       Bilirubin, total  0.4  0.2 - 1.0 MG/DL       ALT (SGPT)  19  12 - 78 U/L       AST (SGOT)  14 (L)  15 - 37 U/L       Alk. phosphatase  74  45 - 117 U/L       Protein, total  7.3  6.4 - 8.2 g/dL       Albumin  3.8  3.5 - 5.0 g/dL       Globulin  3.5  2.0 - 4.0 g/dL       A-G Ratio  1.1  1.1 - 2.2         TROPONIN-HIGH SENSITIVITY          Collection Time: 03/01/21  8:56 AM         Result  Value  Ref Range            Troponin-High Sensitivity  <4  0 - 51 ng/L       NT-PRO BNP          Collection Time: 03/01/21  8:56 AM         Result  Value  Ref Range            NT pro-BNP  33  <125 PG/ML       COVID-19 RAPID TEST          Collection Time: 03/01/21  9:32 AM         Result  Value  Ref Range  Specimen source  Nasopharyngeal          COVID-19 rapid test  Not detected  NOTD         INFLUENZA A+B VIRAL AGS          Collection Time: 03/01/21  9:32 AM         Result   Value  Ref Range            Influenza A Antigen  Negative  NEG              Influenza B Antigen  Negative  NEG              EKG: If performed, independent interpretation documented below in the MDM section       RADIOLOGY:   Non-plain film images such as CT, Ultrasound and MRI are read by the radiologist. Plain radiographic images are visualized and preliminarily interpreted by the ED Provider with the findings documented  in the MDM section.       Interpretation per the Radiologist below, if available at the time of this note:       XR CHEST PORT      Result Date: 03/01/2021   INDICATION: Shortness of breath. Portable AP view of the chest. Direct comparison made to prior chest x-ray dated October 2022 and prior CT dated October 2022. Cardiomediastinal silhouette is stable. There has been near total resolution of the extensive  groundglass and patchy airspace disease seen on prior CT dated October 2022. There is a small patchy airspace opacity within the left midlung. Lungs are otherwise clear. There is no pleural fluid. There is no pneumothorax.       Small patchy airspace opacity within the left midlung.             PROCEDURES     Unless otherwise noted below, none   EKG      Date/Time: 03/01/2021 9:59 AM   Performed by:  Lezlie Lye, MD   Authorized by:  Lezlie Lye, MD       ECG reviewed by ED Physician in the absence of a cardiologist: yes     Interpretation:      Interpretation: non-specific     Rate:      ECG rate:  97     ECG rate assessment: normal     Rhythm:      Rhythm: sinus rhythm     QRS:      QRS axis:  Normal     QRS intervals:  Normal     QRS conduction: normal     ST segments:      ST segments:  Normal   T waves:      T waves: normal           CRITICAL CARE TIME     0        EMERGENCY DEPARTMENT COURSE and DIFFERENTIAL DIAGNOSIS/MDM     Vitals:       Vitals:             03/01/21 1045  03/01/21 1100  03/01/21 1115  03/01/21 1130           BP:  (!) 139/94  (!) 140/95  (!) 146/92  (!)  134/96     Pulse:  (!) 109  (!) 104  (!) 102  90     Resp:             Temp:  SpO2:  (!) 89%  90%  (!) 88%  97%           Weight:                    Patient was given the following medications:     Medications       albuterol (PROVENTIL HFA, VENTOLIN HFA, PROAIR HFA) inhaler 8 Puff (8 Puffs Inhalation Given 03/01/21 0917)     methylPREDNISolone (PF) (Solu-MEDROL) injection 125 mg (125 mg IntraVENous Given 03/01/21 0929)     albuterol-ipratropium (DUO-NEB) 2.5 MG-0.5 MG/3 ML (3 mL Nebulization Given 03/01/21 1127)     ketorolac (TORADOL) injection 15 mg (15 mg IntraVENous Given 03/01/21 1140)       amoxicillin-clavulanate (AUGMENTIN) 875-125 mg per tablet 1 Tablet (1 Tablet Oral Given 03/01/21 1334)           Medical Decision Making   41 year old female with history of asthma who presents with a chief complaint of progressively worsening shortness of breath and  wheezing for the last 2 to 3 days.  She has been using her home nebulizer treatments and inhalers without significant improvement.  Associated nausea, vomiting, and diarrhea.  He also had a cough and reports chest pain with cough and difficulty sleeping  secondary to her shortness of breath.  Has tried OTC medications without relief.  Reports that she had pneumonia in October and is concerned that it has returned.      On exam she appears uncomfortable and is tachypneic with any attempts at speech.  Able to speak in 2-3 word sentences.  Tachycardic in triage but not tachycardic on my evaluation.  She has diffuse end expiratory wheezing consistent with an asthma exacerbation.   Differentials include asthma exacerbation, viral illness, COVID-19, influenza, pneumonia.      Will check basic lab work to rule out any significant electrolyte imbalance or anemia.  We will also assess for leukocytosis in the setting of infection though this is nonspecific.  Will check chest x-ray to rule out focal airspace disease.  Will check  COVID and flu swabs.  Will give  steroids IV as well as inhaler until her COVID test results.  If COVID test is negative we will proceed with nebulizer treatments.      Problems Addressed:   Community acquired pneumonia of left lung, unspecified part of lung: acute illness or injury   Mild intermittent asthma with acute exacerbation: acute illness or injury      Amount and/or Complexity of Data Reviewed   External Data Reviewed: notes.      Details: Discharge summary from October.  Patient was admitted at that time for acute respiratory failure with bilateral pneumonia and RSV      Outpatient rheumatology note from 1/10.  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 Decision-making details documented in ED Course.   Radiology: ordered and independent interpretation performed. Decision-making details documented in ED Course.   ECG/medicine tests: ordered.      Risk   Prescription drug management.   Decision regarding hospitalization.         Lab work unremarkable.  COVID test negative.  Chest x-ray with a possible small infiltrate.  Patient reports feeling improved after breathing treatments though on reevaluation she is still somewhat wheezy.  Did consider hospitalization given ongoing wheezing  but patient prefers to go home.  She was able to ambulate without hypoxia.  I advise she use her nebulizer  treatments regularly for the next 48 hours and return for new or worsening symptoms.        ED Course as of 03/01/21 1825       Nancy Fetter Mar 01, 2021        1003  Chest x-ray with possible right-sided infiltrate per my read [KC]     1310  Patient feeling improved. Able to ambulate without hypoxia. Will d/c on abx and steroids [KC]              ED Course User Index   [KC] Dedra Matsuo, Maxcine Ham, MD                FINAL IMPRESSION           1.  Community acquired pneumonia of left lung, unspecified part of lung         2.  Mild intermittent asthma with acute exacerbation                DISPOSITION/PLAN     RUHAMA LEHEW   results have been reviewed with her.  She has been counseled regarding her diagnosis, treatment, and plan.  She verbally conveys understanding and agreement of  the signs, symptoms, diagnosis, treatment and prognosis and additionally agrees to follow up as discussed.  She also agrees with the care-plan and conveys that all of her questions have been answered.  I have also provided discharge instructions for her  that include: educational information regarding their diagnosis and treatment, and list of reasons why they would want to return to the ED prior to their follow-up appointment, should her condition change.        CLINICAL IMPRESSION      Discharge Note: The patient is stable for discharge home. The signs, symptoms, diagnosis, and discharge instructions have been discussed, understanding conveyed, and agreed upon. The patient is to follow up as recommended or return to ER should their  symptoms worsen.        PATIENT REFERRED TO:     Follow-up Information                  Follow up With  Specialties  Details  Why  Contact Info              Coggin, Nadara Mode, NP  Nurse Practitioner  Schedule an appointment as soon as possible for a visit     [address]   [place] [address]   [phone]                          DISCHARGE MEDICATIONS:     Discharge Medication List as of 03/01/2021  1:24 PM                 START taking these medications          Details        !! amoxicillin-clavulanate (Augmentin) 875-125 mg per tablet  Take 1 Tablet by mouth two (2) times a day for 10 days., Normal, Disp-20 Tablet, R-0               !! predniSONE (DELTASONE) 20 mg tablet  Take 3 Tablets by mouth daily for 5 days., Normal, Disp-15 Tablet, R-0               !! - Potential duplicate medications found. Please discuss with provider.  CONTINUE these medications which have NOT CHANGED          Details        L.acidoph & parac-S.therm-Bifido (FLORA Q2/RISAQUAD-2) 16 billion cell cap cap  Take 1 Capsule by mouth  daily., Normal, Disp-30 Capsule, R-0               acetaminophen (TYLENOL) 325 mg tablet  Take 2 Tablets by mouth every six (6) hours as needed for Pain or Fever., Normal, Disp-60 Tablet, R-0               albuterol-ipratropium (DUO-NEB) 2.5 mg-0.5 mg/3 ml nebu  3 mL by Nebulization route every four (4) hours as needed for Wheezing., Normal, Disp-300 mL, R-0               Nebulizer & Compressor machine  1 Each by Does Not Apply route every four (4) hours as needed for Wheezing or Shortness of Breath., Normal, Disp-1 Each, R-0               cyclobenzaprine (FLEXERIL) 10 mg tablet  Take 1 Tablet by mouth three (3) times daily as needed for Muscle Spasm(s)., Normal, Disp-15 Tablet, R-0               lidocaine 4 % patch  Use one patch daily in affected area, Normal, Disp-15 Each, R-0               insulin lispro (HUMALOG) 100 unit/mL injection  INITIATE CORRECTIVE INSULIN PROTOCOL (RIC): RX RIC Normal Sensitivity (Average weight) AC (before meals), Q6H, CORRECTIONAL SCALE only For Blood Sugar (mg/dl)  of :           140-199=2 units          200-249=3 units 250-299=5 units 300-349=7 units 350 or  greater = Call MD Give in addition to basal medications. Do Not Hold for NPO BEDTIME CORRECTIONAL sliding scale when scheduled: 200-249=2 units 250-299=3 units  300-349=4 units 350 or greater = Call MD Give in addition to basal medications. Do Not Hold fo r NPO Fast Acting - Administer Immediately - or within 15 minutes of start of meal, if mealtime coverage., Normal, Disp-1 Each, R-2               lancets misc  ACHS, Normal, Disp-1 Each, R-11               Blood-Glucose Meter monitoring kit  ACHS, Normal, Disp-1 Kit, R-0               glucose blood VI test strips (blood glucose test) strip  ACHS, Normal, Disp-200 Strip, R-0               guaiFENesin ER (MUCINEX) 600 mg ER tablet  Take 1 Tablet by mouth every twelve (12) hours., Normal, Disp-30 Tablet, R-0               !! amoxicillin-clavulanate (Augmentin) 875-125 mg per  tablet  Take 1 Tablet by mouth two (2) times a day., Normal, Disp-10 Tablet, R-0               !! predniSONE (DELTASONE) 20 mg tablet  Take 1 tablet twice daily for 10 days, then take 1 tablet once daily for 10 days, Normal, Disp-30 Tablet, R-0               atorvastatin (LIPITOR) 20 mg tablet  Take 20 mg by mouth daily., Historical Med  dextroamphetamine-amphetamine (ADDERALL) 20 mg tablet  Take 40 mg by mouth daily., Historical Med               !! - Potential duplicate medications found. Please discuss with provider.                    DISCONTINUED MEDICATIONS:     Discharge Medication List as of 03/01/2021  1:24 PM                  I am the Primary Clinician of Record.    Lezlie Lye, MD (electronically signed)      (Please note that parts of this dictation were completed with voice recognition software. Quite often unanticipated grammatical, syntax, homophones, and other interpretive errors are inadvertently transcribed by the computer software. Please disregards  these errors. Please excuse any errors that have escaped final proofreading.)

## 2021-03-02 LAB — EKG 12-LEAD
Atrial Rate: 99 {beats}/min
Diagnosis: NORMAL
P Axis: 75 degrees
P-R Interval: 144 ms
Q-T Interval: 356 ms
QRS Duration: 90 ms
QTc Calculation (Bazett): 456 ms
R Axis: 77 degrees
T Axis: 55 degrees
Ventricular Rate: 99 {beats}/min

## 2021-03-02 LAB — EKG, 12 LEAD, INITIAL
Atrial Rate: 99 {beats}/min
Calculated P Axis: 75 degrees
Calculated R Axis: 77 degrees
Calculated T Axis: 55 degrees
Diagnosis: NORMAL
P-R Interval: 144 ms
Q-T Interval: 356 ms
QRS Duration: 90 ms
QTC Calculation (Bezet): 456 ms
Ventricular Rate: 99 {beats}/min

## 2021-06-23 NOTE — ED Notes (Signed)
 COLLECTIVENOTIFICATION05/23/2023 09:03MILLER, TERESAMRN: [id number]    [facility]'s patient encounter information:   [id number]  Account [id number]  Billing Account [id number]      Criteria Met    5+ ED Visits in 12 Months    Security and Safety  No Security Events were found.  ED Care Guidelines  There are currently no ED Care Guidelines for this patient. Please check your facility's medical records system.        Prescription Monitoring Program  060- Narcotic Use Score  030- Sedative Use Score  161- Stimulant Use Score  280- Overdose Risk Score  - All Scores range from 000-999 with 75% of the population scoring < 200 and on 1% scoring above 650  - The last digit of the narcotic, sedative, and stimulant score indicates the number of active prescriptions of that type  - Higher Use scores correlate with increased prescribers, pharmacies, mg equiv, and overlapping prescriptions  - Higher Overdose Risk Scores correlate with increased risk of unintentional overdose death   Concerning or unexpectedly high scores should prompt a review of the PMP record; this does not constitute checking PMP for prescribing purposes.    E.D. Visit Count (12 mo.)  Facility Visits   [place] [facility] 2   [facility] 1   [facility] 3   Total 6   Note: Visits indicate total known visits.     Recent Emergency Department Visit Summary  Date Facility [facility] Type Diagnoses or Chief Complaint    Jun 23, 2021  [facility]  [place]  [place]  Emergency    hearing and ZVision loss      Mar 01, 2021  [facility].  [place]    Pneumonia, unspecified organism    Mild intermittent asthma with (acute) exacerbation      Nov 17, 2020  [facility].  [place]    Pneumonia, unspecified organism      Nov 15, 2020  [facility]  [place]    Diarrhea, unspecified    Nicotine dependence,  unspecified, uncomplicated    Left upper quadrant pain    Long term (current) use of inhaled steroids    Unspecified asthma, uncomplicated    Nausea with vomiting, unspecified      Jul 27, 2020  [facility]  [place]  [place]  Emergency    Sore Throat    Headache    Acute pharyngitis, unspecified    Rash and other nonspecific skin eruption      Jul 03, 2020  [facility]  [place]  [place]  Emergency    ABDOMINAL PAIN    Cannabis use, unspecified, uncomplicated    Nausea with vomiting, unspecified        Recent Inpatient Visit Summary  Date Facility [facility] Type Diagnoses or Chief Complaint    Nov 17, 2020  [facility].  [place]    Pneumonia, unspecified organism        Care Team  Provider Specialty Phone Fax Service Dates   Valera Castle, MD Obstetrics and Gynecology: Maternal and Fetal Medicine [phone]  Current    Zena Amos , MD Family Medicine   Current      Collective Portal  This patient has registered at the [facility] Emergency Department   For more information visit: [email or web address]  PLEASE NOTE:     Any care recommendations and other clinical information are provided as guidelines or for historical purposes only, and providers should exercise their own clinical judgment when providing care.    You may only use this information for purposes of treatment, payment or health care operations activities, and subject to the limitations of applicable [employer] Policies.    You should consult directly with the organization that provided a care guideline or other clinical history with any questions about additional information or accuracy or completeness of information provided.     2023 [employer] - [email or web address]

## 2022-01-26 ENCOUNTER — Encounter

## 2022-01-26 NOTE — Progress Notes (Signed)
Formatting of this note is different from the original.  Assessment and Plan:  Assessment:  - Recurrent facial sores and abscesses, parotiditis - no unifying diagnosis from an autoimmune perspective. ANA negative without obvious CTD symptoms otherwise. No evidence of vasculitis on skin biopsy of lesiosn and ANCA negative. FNA of lesion also unrevealing. Query if there could be an underlying infectious etiology?   - Alopecia - worsening  - Chronic pain - chronic, uncontrolled  - History of MRSA - chronic    Plan:  -Will refer to ID for additional work-up of her facial sores and abscesses/parotiditis  -Consider neuro work-up for her neuropathic symptoms after ID evaluation    Immunizations:   -COVID: inquire at next appt  -Pneumococcal: inquire at next appt  -Influenza: inquire at next appt    HCQ Eye exam: N/A    Bone Health: N/A    Contraception: N/A    RTC in 3-4 mo    Patient has been discussed with the attending, Dr. Kathi Ludwig.     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 She continues to have swollen parotid glands and lymph nodes. Alopecia is worsening and has fluid filled bumps on her head that are very painful. She is having worsening numbness and tingling of her left arm and leg with some jerking motions of her left arm that come and go. Chronic pain is largely the same.     Review of Systems:  ROS as mentioned in the HPI with all other systems reviewed and negative.    Past Medical History:    Diagnosis Date   ? Bacterial pneumonia     Oct 2022   ? Parotid abscess    ? RSV infection     Oct 2022     Medications:  Current Outpatient Medications on File Prior to Visit   Medication Sig Dispense Refill   ? albuterol 108 (90 Base) MCG/ACT inhaler  albuterol sulfate HFA 90 mcg/actuation aerosol inhaler     ? amphetamine-dextroamphetamine (Adderall) 20 MG tablet dextroamphetamine-amphetamine 20 mg tablet   TAKE 1 TABLET BY MOUTH TWICE DAILY     ? atorvastatin (Lipitor) 20 MG tablet Take 20 mg by mouth daily.     ? cyclobenzaprine (Flexeril) 10 MG tablet Take 10 mg by mouth.     ? [DISCONTINUED] atorvastatin (Lipitor) 40 mg tablet atorvastatin 40 mg tablet   TAKE 1 TABLET BY MOUTH EVERY DAY     ? [DISCONTINUED] cyclobenzaprine (Flexeril) 10 MG tablet TAKE 1 TABLET BY MOUTH THREE TIMES DAILY AT BEDTIME AS NEEDED       No current facility-administered medications on file prior to visit.     Physical Examination:  Visit Vitals  BP 117/81 (BP Location: Left arm, Patient Position: Sitting, BP Cuff Size: Adult)   Pulse 78   Temp 36.9 C (98.4 F) (Oral)   Resp 20     Physical Exam  Constitutional:       General: She is not in acute distress.  HENT:      Head: Normocephalic and atraumatic.      Comments: Fluid filled bumps on sides of head with extreme tenderness to palpation. No open lesions or overlying skin changes  Right Ear: External ear normal.      Left Ear: External ear normal.      Nose: Nose normal.      Mouth/Throat:      Mouth: Mucous membranes are moist. No oral lesions.      Tongue: No lesions.      Pharynx: No pharyngeal swelling, oropharyngeal exudate or posterior oropharyngeal erythema.   Eyes:      General: No scleral icterus.     Conjunctiva/sclera: Conjunctivae normal.   Neck:      Comments: Significant tenderness to area of swelling with small lymph node of left posterior cervical chain  Cardiovascular:      Rate and Rhythm: Normal rate and regular rhythm.      Pulses: Normal pulses.      Heart sounds: No murmur heard.     No friction rub. No gallop.   Pulmonary:      Effort: Pulmonary effort is normal.      Breath sounds: No wheezing, rhonchi or rales.   Abdominal:      General: Bowel sounds are normal.      Palpations: Abdomen is soft.       Tenderness: There is no abdominal tenderness. There is no guarding.   Musculoskeletal:         General: No swelling.      Cervical back: Neck supple. Tenderness present.      Right lower leg: No edema.      Left lower leg: No edema.      Comments: Extreme tenderness to palpation of occiput and SCM bilaterally   Lymphadenopathy:      Cervical: Cervical adenopathy present.   Skin:     General: Skin is warm.      Findings: No bruising.      Comments: Small erythematous papules on chin   Neurological:      Mental Status: She is alert and oriented to person, place, and time.      Cranial Nerves: No cranial nerve deficit.      Sensory: No sensory deficit.      Motor: No weakness.      Coordination: Coordination normal.   Psychiatric:         Mood and Affect: Mood normal.         Behavior: Behavior normal.     Relevant Results:  Labs reviewed and interpreted from 06/2021:  ANA negative with negative sub-serologies  CCP negative  C3, C4 negative    Previous work-up 2022:  ANCA negative  RF negative  SSA, SSB negative  ACE normal    FNA 06/25/2021  Fine needle aspiration smears:  No malignant cells are identified.  Hemodilute lymphoid elements. (See comment.)    Comment: There is no evidence of acute inflammation or granulomas in this limited sampling to suggest infection; however, correlation with pending cultures is recommended.     Surgical pathology of active rash 04/24/2021  Final Diagnosis   Skin, right forearm posterior; biopsy:  - Ulceration and superficial neutrophilic inflammation (see comment).     Imaging reviewed:  CT neck 06/2021  IMPRESSION:    1.  1 cm cystic focus in the posterior right neck with adjacent inflammatory changes, likely represents infected lymph node/an abscess.    2.  Bilateral cervical adenopathy, likely reactive.    CTA head and neck 06/2021  IMPRESSION:  1.  No large vessel occlusion, aneurysm or AVM.  2.  No acute intracranial abnormality.  Electronically signed by Tana Coast,  MD at  01/26/2022  7:16 PM EST    Associated attestation - Marko Stai, MD - 01/26/2022  7:16 PM EST  Formatting of this note might be different from the original.  Teaching Provider Attestation: I personally saw and evaluated the patient. I repeated the critical/key portions of the service and discussed with the resident.  I have reviewed the resident's documentation and edited as appropriate.   Date of service if different than date of resident service:

## 2022-02-05 ENCOUNTER — Inpatient Hospital Stay: Admit: 2022-02-05 | Payer: PRIVATE HEALTH INSURANCE | Attending: Gastroenterology | Primary: Primary Care

## 2022-02-05 DIAGNOSIS — K449 Diaphragmatic hernia without obstruction or gangrene: Secondary | ICD-10-CM

## 2022-02-05 MED ORDER — TECHNET TC 99M SULFUR COLLOID CO KIT
Freq: Once | Status: AC
Start: 2022-02-05 — End: 2022-02-05
  Administered 2022-02-05: 15:00:00 1 via ORAL

## 2022-03-15 NOTE — Progress Notes (Signed)
Infectious Diseases Clinic Progress Note    Reason for Referral: Facial Cellulitis/MRSA Infection    Assessment/Plan:  Christina Stevens is a 42 y.o. female who presents for recurrent facial sores, abscesses, parotitis.  Pt has been seen by rheumatology, but workup negative. Punch biopsy showed dermal neutrophilic inflammation, no signs of vasculitis.  No clear reason for lesions, although given MRSA diagnosis, discussed decolonization protocol.  Will check HIV, HSV serologies (helpful if negative, but if positive would not be diagnostic), MRSA direct testing, hg A1c, cmp, cbc.  Will discuss with patient after lab results return.      Return to Clinic: 2 months    I have spent 52 number of minutes with the patient providing patient care, including:   Prior to encounter: Reviewing chart/labs, discussion with external providers, obtaining outside medical records 10   During Care: Counseling/eduction, obtaining history/physical 37   After Care: Ordering medications/tests/procedures, discussion with external providers,documentation of work, care coordination 5    Royston Bake MD/MPH  Assistant Professor of Infectious Diseases  [place] (present with wife)    HPI  Christina Stevens is a 32 y.o. who presents for a constellation of symptoms over the past several years.  She notes that she was in good help, until 2019 when she was hospitalized for a severe infection.  After discharge, she notes stepping on a rusty nail, and having a tick bite in quick succession.  Since then, she develops facial/skin and parotid lesions.  Associated symptoms include facial edema.   She also notes hair thinning, including eye brows.  In addition, she has developed a cognitive decline, including short term memory, anxiety/delirium, severe hand writing decline, muscle spasms.  She notes significant an "electrical feeling" throughout her body (jellyfish feeling). She also has had some GI symptoms but has been diagnosed with  gastroparesis. She also noted increased urinary frequency and episodes of hearing and vision deficit.  Given recurrent facial lesions, w/ cultures positive for MRSA the pt was referred to ID.    She notes that she has developed raised, swollen lesions over her face, that then ulcerate before healing.  She notes blood and purulent drainage.  These have began since becoming ill in 2019.  She cut herself while shaving her face. She was placed on IV Vancomycin and Zosyn.  Initially imaging showed just swelling, but a repeat in January showed phlegmon formation.  Another repeat several days later showed a non-drainable fluid collection in the submental region, w/ reactive lymph nodes.  Several days later, another CT then showed an abscess formation. She underwent drainage on 02/15/2018 - cultures grew MRSA.  Her course was complicated by hyperactive delirium while on IV decadron.  She continued oral antibiotics for a month.    Medical History      Diagnosis Date   . ADHD (attention deficit hyperactivity disorder)    . Bacterial pneumonia     Oct 2022   . Parotid abscess    . PCOS (polycystic ovarian syndrome)    . RSV infection     Oct 2022       Surgical History  Past Surgical History:   Procedure Laterality Date   . CT CHEST ANGIO W AND WO IV CONTRAST  06/03/2019    CT CHEST ANGIO W AND WO IV CONTRAST CONVERSION CERNER   . OTHER SURGICAL HISTORY  02/01/2005    Cholecystocecostomy   . OTHER SURGICAL HISTORY  02/01/2017    Incision and  drainage of abscess of neck   . OTHER SURGICAL HISTORY  12/25/2018    MCL - Repair of medial collateral ligament   . US GUIDED SOFT TISSUE BIOPSY  06/25/2021    US GUIDED SOFT TISSUE BIOPSY 06/25/2021 [facility] [place] PROC NVIR     Allergies   Allergen Reactions   . Chlorhexidine Itching     Reaction Type: Allergy   . Dexamethasone Swelling   . Oxycodone Swelling   . Wound Dressing Adhesive Rash   . Oxycodone-Acetaminophen Itching and Rash     Reaction Type: Allergy         Medications:  Current  Outpatient Medications on File Prior to Visit   Medication Sig Dispense Refill   . amphetamine-dextroamphetamine (Adderall) 20 MG tablet dextroamphetamine-amphetamine 20 mg tablet   TAKE 1 TABLET BY MOUTH TWICE DAILY     . atorvastatin (Lipitor) 20 MG tablet Take 20 mg by mouth daily.     Marland Kitchen albuterol 108 (90 Base) MCG/ACT inhaler albuterol sulfate HFA 90 mcg/actuation aerosol inhaler     . cyclobenzaprine (Flexeril) 10 MG tablet Take 10 mg by mouth.       No current facility-administered medications on file prior to visit.     Family History  Family History   Problem Relation Name Age of Onset   . No Known Problems Mother     . No Known Problems Father     . No Known Problems Sister     . No Known Problems Brother     . No Known Problems Maternal Grandmother     . No Known Problems Maternal Grandfather     . No Known Problems Paternal Grandmother     . No Known Problems Paternal Grandfather     . Lupus Mother's Sister     . No Known Problems Mother's Brother     . No Known Problems Father's Sister     . No Known Problems Father's Brother     . No Known Problems Other       Past Social History:    Travel hx:  [place], [place], [place], [place], [place],     Has been to [facility] but not for 10 years   Travelled throughout the country    Hobbies: [place] climbing, concerts, Energy manager tooth hunting (Equities trader)     Occupation:  Optometrist    Pets: 3 dogs, 1 cat    Vaccine status: Up to date    Tobacco:  Smoking - pack a day (cigarettes)  EtOH:  Social drinker - not a lot since she has been dealing with medical issues  Drug: Edibles, marijuana (medical marijuana)   Denies hx of IVDU  Sexual hx: Monogous relationship.  No hx of STIs for either patients       8 years    Review of Systems  A complete review of the cardiovascular, respiratory, HENT, constitutional, integumentary eyes, hematology, GI, GU, neurologic and musculoskeletal systems was completed at time of HPI.  All were negative or normal except for as  noted in the HPI.    Objective    Visit Vitals  BP 130/88 (BP Location: Right arm, Patient Position: Sitting, BP Cuff Size: Adult)   Pulse 86   Temp 36.7 C (98.1 F) (Oral)   Resp 16   Ht 1.549 m   Wt 74.8 kg   SpO2 97%   BMI 31.18 kg/m   OB Status Having periods   Smoking Status Every Day  BSA 1.79 m     Physical Exam    VITALS: Reviewed.  GEN: Healthy appearing, well-developed, NAD.  HEENT: NC/AT, PERRLA, EOMI, MMM  NECK: cervical lymphadenopathy present  CV: RRR, no m/r/g.  LUNGS: CTAB, no w/r/c.  ABD: Soft, NT/ND,  SKIN: Small papules noted on forehead, chin  EXT: No clubbing, cyanosis, or edema.  NEURO: Normal muscle strength and tone. No focal deficits.  PSYCH: Good Judgment. AOx3. Normal memory, mood, and affect.    Labs:     Latest Reference Range & Units Most Recent   Auto WBC 3.9 - 11.7 10e9/L 9.2  06/23/21 09:33   RBC 3.85 - 5.16 10e12/L 5.12  06/23/21 09:33   Hemoglobin 12.0 - 15.0 g/dL 16.1 (H)  06/23/21 09:33   Hematocrit 34.8 - 45.0 % 46.9 (H)  06/23/21 09:33   MCV 78.5 - 96.4 fL 91.6  06/23/21 09:33   MCH 25.6 - 32.2 pg 31.4  06/23/21 09:33   MCHC 30.5 - 34.0 g/dL 34.3 (H)  06/23/21 09:33   RDW 11.3 - 14.7 % 12.0  06/23/21 09:33   Platelets 172 - 440 10e9/L 326  06/23/21 09:33   MPV 8.7 - 12.3 fL 10.3  06/23/21 09:33   nRBC 0.0 - 0.2 % 0.0  06/23/21 09:33   Morph/Comment  See Comment  01/17/17 21:20   Neutrophils Relative % 54.1  06/23/21 09:33   Lymphocytes Relative % 34.9  06/23/21 09:33   Monocytes Relative % 4.9  06/23/21 09:33   Eosinophils Relative % 5.6  06/23/21 09:33   Basophils Relative % 0.5  06/23/21 09:33   Neutrophils Absolute 1.9 - 7.9 10e9/L 5.0  06/23/21 09:33   Lymphocytes Absolute 1.3 - 3.6 10e9/L 3.2  06/23/21 09:33   Monocytes Absolute 0.3 - 0.7 10e9/L 0.5  06/23/21 09:33   Eosinophils Absolute 0.0 - 0.4 10e9/L 0.5 (H)  06/23/21 09:33   Basophils Absolute 0.0 - 0.1 10e9/L 0.1  06/23/21 09:33   % Imm Gran 0.0 - 0.5 % 0 (E)  03/01/21 08:56   IMM GRAN 0.00 - 0.04 K/UL 0.0 (E)  03/01/21 08:56   Sed  Rate 0-20 mm/hr mm/hr 5  06/24/21 15:25   (H): Data is abnormally high  (E): External lab result    Punch Biopsy:  Pathology ulcers: Sections reveal a punch biopsy to the depth of subcutaneous fat.  There is a zone of ulceration with fibrinous crust containing dense neutrophilic inflammation.  The small focus of intact epidermis shows erosion with serum-containing scale.  The superficial dermis contains patchy perivascular lymphocytic inflammation, as well as abundant neutrophils and frequent eosinophils.     Taken together, the findings are those of ulceration and superficial dermal neutrophilic inflammation.  Diagnostic features of vasculitis or vasculopathy are not seen.  The changes are not specific with respect to the etiology of the ulceration; similar changes could be seen at the site of excoriation or an infectious ulcer.  A GMS stain will be reported in an addendum.      Microbiology:    Superficial culture L cheek: MRSA  Imaging:  CT Neck 5/23: IMPRESSION:     1.  1 cm cystic focus in the posterior right neck with adjacent inflammatory changes, likely represents infected lymph node/an abscess.     2.  Bilateral cervical adenopathy, likely reactive.

## 2022-03-17 ENCOUNTER — Inpatient Hospital Stay: Payer: PRIVATE HEALTH INSURANCE | Attending: Gastroenterology

## 2022-03-17 MED ORDER — SODIUM CHLORIDE 0.9 % IV SOLN
0.9 | INTRAVENOUS | Status: DC | PRN
Start: 2022-03-17 — End: 2022-03-17
  Administered 2022-03-17: 17:00:00 via INTRAVENOUS

## 2022-03-17 MED ORDER — NORMAL SALINE FLUSH 0.9 % IV SOLN
0.9 | INTRAVENOUS | Status: DC | PRN
Start: 2022-03-17 — End: 2022-03-17

## 2022-03-17 MED ORDER — NORMAL SALINE FLUSH 0.9 % IV SOLN
0.9 | Freq: Two times a day (BID) | INTRAVENOUS | Status: DC
Start: 2022-03-17 — End: 2022-03-17

## 2022-03-17 MED ORDER — PROPOFOL 100 MG/10ML IV EMUL
100 MG/10ML | INTRAVENOUS | Status: DC | PRN
  Administered 2022-03-17 (×2): 50 via INTRAVENOUS
  Administered 2022-03-17 (×2): 70 via INTRAVENOUS
  Administered 2022-03-17: 17:00:00 60 via INTRAVENOUS

## 2022-03-17 MED ORDER — SODIUM CHLORIDE 0.9 % IV SOLN
0.9 | INTRAVENOUS | Status: DC
Start: 2022-03-17 — End: 2022-03-17

## 2022-03-17 MED ORDER — LIDOCAINE HCL (PF) 2 % IJ SOLN
2 % | INTRAMUSCULAR | Status: DC | PRN
  Administered 2022-03-17: 17:00:00 100 via INTRAVENOUS

## 2022-03-17 MED ORDER — SODIUM CHLORIDE 0.9 % IV SOLN
0.9 | INTRAVENOUS | Status: DC | PRN
Start: 2022-03-17 — End: 2022-03-17

## 2022-03-17 MED FILL — NORMAL SALINE FLUSH 0.9 % IV SOLN: 0.9 % | INTRAVENOUS | Qty: 40

## 2022-03-17 MED FILL — SODIUM CHLORIDE 0.9 % IV SOLN: 0.9 % | INTRAVENOUS | Qty: 1000

## 2022-03-17 NOTE — H&P (Signed)
[facility]  [address] [place]  [place], [place]  [phone]                                History and Physical     NAME: Christina Stevens   DOB:  April 11, 1980   MRN:  [id number]     HPI:  The patient was seen and examined.    Past Surgical History:   Procedure Laterality Date    APPENDECTOMY      IR CHOLECYSTOSTOMY PERCUTANEOUS COMPLETE       Past Medical History:   Diagnosis Date    ADHD     [place] Patient infection     MRSA (methicillin resistant staph aureus) culture positive     MS (congenital mitral stenosis)     PCOS (polycystic ovarian syndrome)      Social History     Tobacco Use    Smoking status: Every Day    Smokeless tobacco: Never   Substance Use Topics    Alcohol use: No    Drug use: Yes     Types: Marijuana (Weed)     Allergies   Allergen Reactions    Oxycodone-Acetaminophen Itching     No family history on file.  No current facility-administered medications for this encounter.         PHYSICAL EXAM:  General: WD, WN. Alert, cooperative, no acute distress    HEENT: NC, Atraumatic.  PERRLA, EOMI. Anicteric sclerae.  Lungs:  CTA Bilaterally. No Wheezing/Rhonchi/Rales.  Heart:  Regular  rhythm,  No murmur, No Rubs, No Gallops  Abdomen: Soft, Non distended, Non tender.  +Bowel sounds, no HSM  Extremities: No c/c/e  Neurologic:  CN 2-12 gi, Alert and oriented X 3.  No acute neurological distress   Psych:   Good insight. Not anxious nor agitated.    The heart, lungs and mental status were satisfactory for the administration of MAC sedation and for the procedure.      Mallampati score: 2     The patient was counseled at length about the risks of contracting Covid-19 in the peri-operative and post-operative states including the recovery window of their procedure.  The patient was made aware that contracting Covid-19 after a surgical procedure may worsen their prognosis for recovering from the virus and lend to a higher morbidity and or mortality risk.  The patient was given the  options of postponing their procedure. All of the risks, benefits, and alternatives were discussed. The patient does wish to proceed with the procedure.      Assessment:   Colon polyp for EMR    Plan:   Endoscopic procedure  MAC sedation

## 2022-03-17 NOTE — Anesthesia Post-Procedure Evaluation (Signed)
Department of Anesthesiology  Postprocedure Note    Patient: Christina Stevens  MRN: [id number]  Birthdate: October 08, 1980  Date of evaluation: 03/17/2022    Procedure Summary       Date: 03/17/22 Room / Location: [facility] ENDO 06 / [facility] ENDOSCOPY    Anesthesia Start: 1146 Anesthesia Stop: 1210    Procedure: COLONOSCOPY W/ ENDOSCOPIC MUCOSAL RESECTION (Lower GI Region) Diagnosis:       Diarrhea, unspecified type      Nausea and vomiting, unspecified vomiting type      Polyp of colon, unspecified part of colon, unspecified type      (Diarrhea, unspecified type [R19.7])      (Nausea and vomiting, unspecified vomiting type [R11.2])      (Polyp of colon, unspecified part of colon, unspecified type [K63.5])    Surgeons: Ruthe Mannan, MD Responsible Provider: Jean Rosenthal, MD    Anesthesia Type: MAC ASA Status: 2            Anesthesia Type: MAC    Aldrete Phase I: Aldrete Score: 10    Aldrete Phase II: Aldrete Score: 10    Anesthesia Post Evaluation    Patient location during evaluation: bedside  Patient participation: complete - patient participated  Level of consciousness: awake  Pain score: 0  Airway patency: patent  Nausea & Vomiting: no nausea and no vomiting  Cardiovascular status: hemodynamically stable  Respiratory status: acceptable  Hydration status: euvolemic  Pain management: adequate    No notable events documented.

## 2022-03-17 NOTE — Anesthesia Pre-Procedure Evaluation (Signed)
Department of Anesthesiology  Preprocedure Note       Name:  Christina Stevens   Age:  42 y.o.  DOB:  07/28/1980                                          MRN:  [id number]         Date:  03/17/2022      Surgeon: Juliann Mule):  Ruthe Mannan, MD    Procedure: Procedure(s):  COLONOSCOPY W/ ENDOSCOPIC MUCOSAL RESECTION    Medications prior to admission:   Prior to Admission medications    Medication Sig Start Date End Date Taking? Authorizing Provider   pantoprazole (PROTONIX) 20 MG tablet Take 1 tablet by mouth daily   Yes [provider]   acetaminophen (TYLENOL) 325 MG tablet Take 2 tablets by mouth every 6 hours as needed 11/24/20   Automatic Reconciliation, Ar   amphetamine-dextroamphetamine (ADDERALL) 20 MG tablet Take 2 tablets by mouth daily.    Automatic Reconciliation, Ar   atorvastatin (LIPITOR) 20 MG tablet Take 1 tablet by mouth daily    Automatic Reconciliation, Ar   ipratropium-albuterol (DUONEB) 0.5-2.5 (3) MG/3ML SOLN nebulizer solution Inhale 3 mLs into the lungs every 4 hours as needed  Patient not taking: Reported on 03/17/2022 11/24/20   Automatic Reconciliation, Ar       Current medications:    Current Facility-Administered Medications   Medication Dose Route Frequency Provider Last Rate Last Admin    0.9 % sodium chloride infusion   IntraVENous Continuous Sandhu, Bimaljit, MD        sodium chloride flush 0.9 % injection 5-40 mL  5-40 mL IntraVENous 2 times per day Sandhu, Bimaljit, MD        sodium chloride flush 0.9 % injection 5-40 mL  5-40 mL IntraVENous PRN Sandhu, Bimaljit, MD        0.9 % sodium chloride infusion  25 mL IntraVENous PRN Sandhu, Bimaljit, MD         Facility-Administered Medications Ordered in Other Encounters   Medication Dose Route Frequency Provider Last Rate Last Admin    0.9 % sodium chloride infusion   IntraVENous Continuous PRN Storsved, Jannett Celestine, APRN - CRNA   New Bag at 03/17/22 1145       Allergies:    Allergies   Allergen Reactions    Oxycodone-Acetaminophen  Itching       Problem List:    Patient Active Problem List   Diagnosis Code    Facial cellulitis [id number]    Community acquired pneumonia J18.9       Past Medical History:        Diagnosis Date    ADHD     [facility] Patient infection     MRSA (methicillin resistant staph aureus) culture positive     MS (congenital mitral stenosis)     PCOS (polycystic ovarian syndrome)        Past Surgical History:        Procedure Laterality Date    APPENDECTOMY      IR CHOLECYSTOSTOMY PERCUTANEOUS COMPLETE         Social History:    Social History     Tobacco Use    Smoking status: Every Day    Smokeless tobacco: Never   Substance Use Topics    Alcohol use: No  Ready to quit: Not Answered  Counseling given: Not Answered      Vital Signs (Current):   Vitals:    03/17/22 1135   Pulse: 80   Resp: 10   TempSrc: Tympanic   SpO2: 99%   Weight: 73.5 kg (162 lb)   Height: 1.549 m (5' 1"$ )                                              BP Readings from Last 3 Encounters:   02/09/17 104/65       NPO Status: Time of last liquid consumption: 1138                        Time of last solid consumption: 2200                        Date of last liquid consumption: 03/16/22                        Date of last solid food consumption: 03/15/22    BMI:   Wt Readings from Last 3 Encounters:   03/17/22 73.5 kg (162 lb)   02/09/17 72.6 kg (160 lb 0.9 oz)     Body mass index is 30.61 kg/m.    CBC:   Lab Results   Component Value Date/Time    WBC 12.1 03/01/2021 08:56 AM    RBC 4.65 03/01/2021 08:56 AM    HGB 15.2 03/01/2021 08:56 AM    HCT 43.8 03/01/2021 08:56 AM    MCV 94.2 03/01/2021 08:56 AM    RDW 12.4 03/01/2021 08:56 AM    PLT 263 03/01/2021 08:56 AM       CMP:   Lab Results   Component Value Date/Time    NA 139 03/01/2021 08:56 AM    K 3.7 03/01/2021 08:56 AM    CL 108 03/01/2021 08:56 AM    CO2 23 03/01/2021 08:56 AM    BUN 7 03/01/2021 08:56 AM    CREATININE 0.85 03/01/2021 08:56 AM    GFRAA >60 09/06/2017 06:55 PM     AGRATIO 1.1 03/01/2021 08:56 AM    GLUCOSE 112 03/01/2021 08:56 AM    PROT 7.3 03/01/2021 08:56 AM    CALCIUM 8.6 03/01/2021 08:56 AM    BILITOT 0.4 03/01/2021 08:56 AM    ALKPHOS 74 03/01/2021 08:56 AM    AST 14 03/01/2021 08:56 AM    ALT 19 03/01/2021 08:56 AM       POC Tests: No results for input(s): "POCGLU", "POCNA", "POCK", "POCCL", "POCBUN", "POCHEMO", "POCHCT" in the last 72 hours.    Coags: No results found for: "PROTIME", "INR", "APTT"    HCG (If Applicable): No results found for: "PREGTESTUR", "PREGSERUM", "HCG", "HCGQUANT"     ABGs:   Lab Results   Component Value Date/Time    PHART 7.42 11/20/2020 03:36 AM    PO2ART 53 11/20/2020 03:36 AM    PCO2ART 38.7 11/20/2020 03:36 AM    HCO3ART 25.3 11/20/2020 03:36 AM    BEART 0.9 11/20/2020 03:36 AM        Type & Screen (If Applicable):  No results found for: "LABABO", "LABRH"    Drug/Infectious Status (If Applicable):  No results found for: "HIV", "HEPCAB"    COVID-19 Screening (If Applicable):  Lab Results   Component Value Date/Time    COVID19 Not detected 03/01/2021 09:32 AM    COVID19 Nasopharyngeal 11/17/2020 11:26 AM           Anesthesia Evaluation  Patient summary reviewed  Airway: Mallampati: III  TM distance: >3 FB   Neck ROM: full  Mouth opening: > = 3 FB   Dental: normal exam         Pulmonary:Negative Pulmonary ROS and normal exam                               Cardiovascular:  Exercise tolerance: good (>4 METS)          Rhythm: regular  Rate: normal                 ROS comment: TTE  eft ventricle: Systolic function was normal. Ejection fraction was  estimated in the range of 55 % to 60 %. There were no regional wall motion  abnormalities.     Mitral valve: There was mild regurgitation.       Neuro/Psych:   (+) psychiatric history:            GI/Hepatic/Renal: Neg GI/Hepatic/Renal ROS            Endo/Other:                      ROS comment: PCOS Abdominal:             Vascular: negative vascular ROS.         Other Findings:             Anesthesia  Plan      MAC     ASA 2       Induction: intravenous.      Anesthetic plan and risks discussed with patient.      Plan discussed with CRNA.                    Jean Rosenthal, MD   03/17/2022

## 2022-03-17 NOTE — Discharge Instructions (Signed)
[facility]  [address] [place], [place]                                  KHAMAYA FIRST  [id number]  Dec 05, 1980    COLON DISCHARGE INSTRUCTIONS    DISCOMFORT:  Redness at IV site- apply warm compress to area; if redness or soreness persist- contact your physician  There may be a slight amount of blood passed from the rectum  Gaseous discomfort- walking, belching will help relieve any discomfort      DIET:   High fiber diet.   - however -  remember your colon is empty and a heavy meal will produce gas.   Avoid these foods:  vegetables, fried / greasy foods, carbonated drinks for today  You may not  drink alcoholic beverages for at least 12 hours     ACTIVITY:  It is recommended that you spend the remainder of the day resting -  avoid any strenuous activity.  You may not operate a vehicle for 12 hours  You may not  engage in an occupation involving machinery or appliances for rest of today    Avoid making any critical decisions for at least 24 hour    CALL M.D.  ANY SIGN OF:   Increasing pain, nausea, vomiting  Abdominal distension (swelling)  New increased bleeding (oral or rectal)  Fever (chills)  Pain in chest area  Bloody discharge from nose or mouth  Shortness of breath    You may not  take any Advil, Aspirin, Ibuprofen, Motrin, Aleve, or Goody's for 7 days, ONLY  Tylenol as needed for pain.    Post procedure diagnosis:   Colon polyp-EMR done    Post-procedure recommendations:  -Await pathology.  -High fiber diet.  -Repeat colonoscopy in 1 year     -Resume normal medication(s).  -No aspirin or NSAIDs for 1 week    Follow-up Instructions:    Call Dr. Doristine Johns for any questions or problems.     If we took a biopsy please call the office within 2 weeks to discuss your  pathology results. Telephone # [phone]         Learning About Coronavirus (COVID-19)  Coronavirus (COVID-19): Overview  What is coronavirus (COVID-19)?  The coronavirus disease (COVID-19) is  caused by a virus. It is an illness that was first found in [place], [place], in December 2019. It has since spread worldwide.  The virus can cause fever, cough, and trouble breathing. In severe cases, it can cause pneumonia and make it hard to breathe without help. It can cause death.  Coronaviruses are a large group of viruses. They cause the common cold. They also cause more serious illnesses like Middle East respiratory syndrome (MERS) and severe acute respiratory syndrome (SARS). COVID-19 is caused by a novel coronavirus. That means it's a new type that has not been seen in people before.  This virus spreads person-to-person through droplets from coughing and sneezing. It can also spread when you are close to someone who is infected. And it can spread when you touch something that has the virus on it, such as a doorknob or a tabletop.  What can you do to protect yourself from coronavirus (COVID-19)?  The best way to protect yourself from getting sick is to:  Avoid areas where there is an outbreak.  Avoid contact with people who  may be infected.  Wash your hands often with soap or alcohol-based hand sanitizers.  Avoid crowds and try to stay at least 6 feet away from other people.  Wash your hands often, especially after you cough or sneeze. Use soap and water, and scrub for at least 20 seconds. If soap and water aren't available, use an alcohol-based hand sanitizer.  Avoid touching your mouth, nose, and eyes.  What can you do to avoid spreading the virus to others?  To help avoid spreading the virus to others:  Cover your mouth with a tissue when you cough or sneeze. Then throw the tissue in the trash.  Use a disinfectant to clean things that you touch often.  Stay home if you are sick or have been exposed to the virus. Don't go to school, work, or public areas. And don't use public transportation.  If you are sick:  Leave your home only if you need to get medical care. But call the doctor's office first so they  know you're coming. And wear a face mask, if you have one.  If you have a face mask, wear it whenever you're around other people. It can help stop the spread of the virus when you cough or sneeze.  Clean and disinfect your home every day. Use household cleaners and disinfectant wipes or sprays. Take special care to clean things that you grab with your hands. These include doorknobs, remote controls, phones, and handles on your refrigerator and microwave. And don't forget countertops, tabletops, bathrooms, and computer keyboards.  When to call for help  Call 911 anytime you think you may need emergency care. For example, call if:  You have severe trouble breathing. (You can't talk at all.)  You have constant chest pain or pressure.  You are severely dizzy or lightheaded.  You are confused or can't think clearly.  Your face and lips have a blue color.  You pass out (lose consciousness) or are very hard to wake up.  Call your doctor now if you develop symptoms such as:  Shortness of breath.  Fever.  Cough.  If you need to get care, call ahead to the doctor's office for instructions before you go. Make sure you wear a face mask, if you have one, to prevent exposing other people to the virus.  Where can you get the latest information?  The following health organizations are tracking and studying this virus. Their websites contain the most up-to-date information. You'll also learn what to do if you think you may have been exposed to the virus.  U.S. Centers for Disease Control and Prevention (CDC): The CDC provides updated news about the disease and travel advice. The website also tells you how to prevent the spread of infection. http://www.wolf.info/  World Health Organization [facility]): WHO offers information about the virus outbreaks. WHO also has travel advice. RoleLink.com.br  Current as of: May 03, 2018               Content Version: 12.4   2006-2020 Healthwise, Incorporated.   Care instructions adapted under license by your  healthcare professional. If you have questions about a medical condition or this instruction, always ask your healthcare professional. [place] any warranty or liability for your use of this information.

## 2022-03-17 NOTE — Op Note (Signed)
[facility]  [address] [place], [place]  [phone]                              Colonoscopy Procedure Note      Indications:    Personal history of colon polyps (screening only)     Operator:  Ruthe Mannan, MD    Surgical Assistant: Circulator: Atilano Median, RN    Implants: none    Referring Provider: Sherley Bounds, APRN - NP    Sedation:  MAC anesthesia    Procedure Details:  After informed consent was obtained with all risks and benefits of procedure explained and preoperative exam completed, the patient was taken to the endoscopy suite and placed in the left lateral decubitus position.  Upon sequential sedation as per above, a digital rectal exam was performed  And was normal.  The Olympus videocolonoscope  was inserted in the rectum and carefully advanced to the cecum, which was identified by the ileocecal valve and appendiceal orifice.  The quality of preparation was fair.  The colonoscope was slowly withdrawn with careful evaluation between folds. Retroflexion in the rectum was performed and was normal..     Findings:   Rectum: 1  Sessile polyp(s), the largest 5 mm in size  Grade 1 internal hemorrhoid(s);  Sigmoid: no mucosal lesion appreciated  Descending Colon: no mucosal lesion appreciated  Transverse Colon: no mucosal lesion appreciated  Ascending Colon: no mucosal lesion appreciated  Cecum: A flat polyp measuring 20 mm x 12 mm was noted at ileocecal.  This was not going into the terminal ileum.  Rest of the cecum was normal.  Terminal Ileum: not intubated    Interventions:   Ileocecal valve region was lifted using blue boost injection.  A total of 9 mL was injected and the lesion lifted well.  Subsequently, resection was performed using hot hexagonal snare at 13 mm in piecemeal fashion.  Margins of the lesion were ablated using soft coag settings of E RBE.  Resection was complete and tissue was retrieved.  1 complete polypectomy was performed using cold  snare in the rectum and the tissue was retrieved.    Specimen Removed:    ID Type Source Tests Collected by Time Destination   1 : ilieal-cecal valve polyp Tissue Cecum SURGICAL PATHOLOGY Ruthe Mannan, MD 03/17/2022 1205    2 : rectal polyp Tissue Rectum SURGICAL PATHOLOGY Ruthe Mannan, MD 03/17/2022 [id number]        Complications: None.     EBL:  None.    Recommendations:   -Await pathology.  -High fiber diet.  -Repeat colonoscopy in 1 year     -Resume normal medication(s).  -No aspirin or NSAIDs for 1 week     Discharge Disposition:  Home in the company of a driver when able to ambulate.    Ruthe Mannan, MD  03/17/2022  12:13 PM

## 2022-03-17 NOTE — Progress Notes (Signed)
Endoscopy recovery  Patient returned to baseline, vital signs stable (see vital sign flowsheet). Patient offered liquids and tolerated well. Respiratory status within defined limits. Abdomen soft not tender. Skin with in defined limits. Responsible party driving patient home was given the opportunity to ask questions. Patient discharged with documented belongings.

## 2022-03-31 ENCOUNTER — Emergency Department: Admit: 2022-03-31 | Payer: PRIVATE HEALTH INSURANCE | Primary: Primary Care

## 2022-03-31 ENCOUNTER — Inpatient Hospital Stay
Admit: 2022-03-31 | Discharge: 2022-04-01 | Disposition: A | Discharge status: Home / Self Care | Payer: PRIVATE HEALTH INSURANCE | Attending: Emergency Medicine

## 2022-03-31 DIAGNOSIS — M461 Sacroiliitis, not elsewhere classified: Secondary | ICD-10-CM

## 2022-03-31 LAB — CBC WITH AUTO DIFFERENTIAL
Absolute Immature Granulocyte: 0 10*3/uL (ref 0.00–0.04)
Basophils %: 0 % (ref 0–1)
Basophils Absolute: 0 10*3/uL (ref 0.0–0.1)
Eosinophils %: 4 % (ref 0–7)
Eosinophils Absolute: 0.2 10*3/uL (ref 0.0–0.4)
Hematocrit: 42.8 % (ref 35.0–47.0)
Hemoglobin: 14.6 g/dL (ref 11.5–16.0)
Immature Granulocytes: 0 % (ref 0.0–0.5)
Lymphocytes %: 17 % (ref 12–49)
Lymphocytes Absolute: 1 10*3/uL (ref 0.8–3.5)
MCH: 31.7 PG (ref 26.0–34.0)
MCHC: 34.1 g/dL (ref 30.0–36.5)
MCV: 93 FL (ref 80.0–99.0)
MPV: 10 FL (ref 8.9–12.9)
Monocytes %: 14 % — ABNORMAL HIGH (ref 5–13)
Monocytes Absolute: 0.8 10*3/uL (ref 0.0–1.0)
Neutrophils %: 65 % (ref 32–75)
Neutrophils Absolute: 3.6 10*3/uL (ref 1.8–8.0)
Nucleated RBCs: 0 PER 100 WBC
Platelets: 280 10*3/uL (ref 150–400)
RBC: 4.6 M/uL (ref 3.80–5.20)
RDW: 11.6 % (ref 11.5–14.5)
WBC: 5.5 10*3/uL (ref 3.6–11.0)
nRBC: 0 10*3/uL (ref 0.00–0.01)

## 2022-03-31 LAB — COMPREHENSIVE METABOLIC PANEL
ALT: 14 U/L (ref 12–78)
AST: 12 U/L — ABNORMAL LOW (ref 15–37)
Albumin/Globulin Ratio: 1 — ABNORMAL LOW (ref 1.1–2.2)
Albumin: 3.8 g/dL (ref 3.5–5.0)
Alk Phosphatase: 69 U/L (ref 45–117)
Anion Gap: 6 mmol/L (ref 5–15)
BUN: 6 MG/DL (ref 6–20)
Bun/Cre Ratio: 7 — ABNORMAL LOW (ref 12–20)
CO2: 24 mmol/L (ref 21–32)
Calcium: 9 MG/DL (ref 8.5–10.1)
Chloride: 106 mmol/L (ref 97–108)
Creatinine: 0.91 MG/DL (ref 0.55–1.02)
Est, Glom Filt Rate: >60 mL/min/{1.73_m2} (ref >60)
Globulin: 3.7 g/dL (ref 2.0–4.0)
Glucose: 87 mg/dL (ref 65–100)
Potassium: 3.7 mmol/L (ref 3.5–5.1)
Sodium: 136 mmol/L (ref 136–145)
Total Bilirubin: 0.4 MG/DL (ref 0.2–1.0)
Total Protein: 7.5 g/dL (ref 6.4–8.2)

## 2022-03-31 LAB — HCG, SERUM, QUALITATIVE: hCG Qual: NEGATIVE

## 2022-03-31 LAB — LIPASE: Lipase: 26 U/L (ref 13–75)

## 2022-03-31 MED ORDER — MORPHINE SULFATE (PF) 4 MG/ML IJ SOLN
4 | INTRAMUSCULAR | Status: AC
Start: 2022-03-31 — End: 2022-03-31
  Administered 2022-03-31: 22:00:00 4 mg via INTRAVENOUS

## 2022-03-31 MED ORDER — DIAZEPAM 5 MG/ML IJ SOLN
5 | Freq: Once | INTRAMUSCULAR | Status: AC
Start: 2022-03-31 — End: 2022-03-31
  Administered 2022-03-31: 23:00:00 2.5 mg via INTRAVENOUS

## 2022-03-31 MED ORDER — IOPAMIDOL 76 % IV SOLN
76 | Freq: Once | INTRAVENOUS | Status: AC | PRN
Start: 2022-03-31 — End: 2022-03-31
  Administered 2022-03-31: 100 mL via INTRAVENOUS

## 2022-03-31 MED ORDER — ONDANSETRON HCL 4 MG/2ML IJ SOLN
4 | Freq: Once | INTRAMUSCULAR | Status: AC
Start: 2022-03-31 — End: 2022-03-31
  Administered 2022-03-31: 22:00:00 4 mg via INTRAVENOUS

## 2022-03-31 MED ORDER — LORAZEPAM 1 MG PO TABS
1 | Freq: Once | ORAL | Status: DC
Start: 2022-03-31 — End: 2022-03-31

## 2022-03-31 MED FILL — DIAZEPAM 5 MG/ML IJ SOLN: 5 MG/ML | INTRAMUSCULAR | Qty: 2

## 2022-03-31 MED FILL — ISOVUE-370 76 % IV SOLN: 76 % | INTRAVENOUS | Qty: 100

## 2022-03-31 MED FILL — ONDANSETRON HCL 4 MG/2ML IJ SOLN: 4 MG/2ML | INTRAMUSCULAR | Qty: 2

## 2022-03-31 MED FILL — MORPHINE SULFATE 4 MG/ML IJ SOLN: 4 mg/mL | INTRAMUSCULAR | Qty: 1

## 2022-03-31 NOTE — ED Notes (Signed)
Patient ambulatory to bathroom, post-void bladder scan is 63m

## 2022-03-31 NOTE — ED Triage Notes (Signed)
Pt reports she had colonoscopy on 2/14 and has not had solid BM yet

## 2022-03-31 NOTE — ED Notes (Signed)
Discharge instructions have been discussed with patient. Opportunity for questions and further education has been provided to patient. Patient  expressed understanding. Patient understands to seek Emergency Medical attention for chest pain or new or worsening symptoms.

## 2022-03-31 NOTE — ED Notes (Signed)
Pain meds?

## 2022-03-31 NOTE — Discharge Instructions (Addendum)
You have been seen today for your low back pain. We have completed MRI's of your cervical, thoracic, and lumbar spines today. We have discussed your imaging, labs, and presentation with Dr. Simonne Maffucci with neurosurgery and he has recommended that you follow-up with him in his office this week.     I am sending you home with Norco for your pain. Please do not take this on an empty stomach. I am also sending you with miralax stool softener to take daily to avoid constipation from the medications.     Please follow-up with your PCP within the next week regarding your back pain, abdominal pain for re-evaluation.     Concerning signs/symptoms may include but are not limited to worsening numbness/tingling/weakness of upper or lower extremities, new bladder incontinence, pain that is not resolved with medications, chest pain, shortness of breath, persistent nausea/vomiting, new or worsening abdominal pain, palpitations.     Thank You!    It was a pleasure taking care of you in our Emergency Department today. We know that when you come to our Emergency Department, you are entrusting [place] with your health, comfort, and safety. Our physicians and nurses honor that trust, and truly appreciate the opportunity to care for you and your loved ones.      We also value your feedback. If you receive a survey about your Emergency Department experience today, please fill it out.  We care about our patients' feedback, and we listen to what you have to say.  Thank you.    Delos Haring, PA-C  ________________________________________________________________________  I have included a copy of your lab results and/or radiologic studies from today's visit so you can have them easily available at your follow-up visit. We hope you feel better and please do not hesitate to contact the ED if you have any questions at all!    Recent Results (from the past 12 hour(s))   CBC with Diff    Collection Time: 03/31/22  2:09 PM   Result Value Ref Range    WBC  5.5 3.6 - 11.0 K/uL    RBC 4.60 3.80 - 5.20 M/uL    Hemoglobin 14.6 11.5 - 16.0 g/dL    Hematocrit 42.8 35.0 - 47.0 %    MCV 93.0 80.0 - 99.0 FL    MCH 31.7 26.0 - 34.0 PG    MCHC 34.1 30.0 - 36.5 g/dL    RDW 11.6 11.5 - 14.5 %    Platelets 280 150 - 400 K/uL    MPV 10.0 8.9 - 12.9 FL    Nucleated RBCs 0.0 0 PER 100 WBC    nRBC 0.00 0.00 - 0.01 K/uL    Neutrophils % 65 32 - 75 %    Lymphocytes % 17 12 - 49 %    Monocytes % 14 (H) 5 - 13 %    Eosinophils % 4 0 - 7 %    Basophils % 0 0 - 1 %    Immature Granulocytes 0 0.0 - 0.5 %    Neutrophils Absolute 3.6 1.8 - 8.0 K/UL    Lymphocytes Absolute 1.0 0.8 - 3.5 K/UL    Monocytes Absolute 0.8 0.0 - 1.0 K/UL    Eosinophils Absolute 0.2 0.0 - 0.4 K/UL    Basophils Absolute 0.0 0.0 - 0.1 K/UL    Absolute Immature Granulocyte 0.0 0.00 - 0.04 K/UL    Differential Type AUTOMATED     CMP    Collection Time: 03/31/22  2:09 PM  Result Value Ref Range    Sodium 136 136 - 145 mmol/L    Potassium 3.7 3.5 - 5.1 mmol/L    Chloride 106 97 - 108 mmol/L    CO2 24 21 - 32 mmol/L    Anion Gap 6 5 - 15 mmol/L    Glucose 87 65 - 100 mg/dL    BUN 6 6 - 20 MG/DL    Creatinine 0.91 0.55 - 1.02 MG/DL    Bun/Cre Ratio 7 (L) 12 - 20      Est, Glom Filt Rate >60 >60 ml/min/1.23m    Calcium 9.0 8.5 - 10.1 MG/DL    Total Bilirubin 0.4 0.2 - 1.0 MG/DL    ALT 14 12 - 78 U/L    AST 12 (L) 15 - 37 U/L    Alk Phosphatase 69 45 - 117 U/L    Total Protein 7.5 6.4 - 8.2 g/dL    Albumin 3.8 3.5 - 5.0 g/dL    Globulin 3.7 2.0 - 4.0 g/dL    Albumin/Globulin Ratio 1.0 (L) 1.1 - 2.2     Lipase    Collection Time: 03/31/22  2:09 PM   Result Value Ref Range    Lipase 26 13 - 75 U/L   HCG Qualitative, Serum    Collection Time: 03/31/22  2:09 PM   Result Value Ref Range    hCG Qual Negative NEG     TSH + Free T4 Panel    Collection Time: 03/31/22  5:24 PM   Result Value Ref Range    TSH, 3RD GENERATION 1.37 0.36 - 3.74 uIU/mL    T4 Free 1.0 0.8 - 1.5 NG/DL   Urinalysis    Collection Time: 03/31/22  8:06 PM   Result  Value Ref Range    Color, UA YELLOW/STRAW      Appearance CLEAR CLEAR      Specific Gravity, UA <1.005 1.003 - 1.030    pH, Urine 6.0 5.0 - 8.0      Protein, UA Negative NEG mg/dL    Glucose, UA Negative NEG mg/dL    Ketones, Urine 15 (A) NEG mg/dL    Bilirubin Urine Negative NEG      Blood, Urine Negative NEG      Urobilinogen, Urine 1.0 0.2 - 1.0 EU/dL    Nitrite, Urine Negative NEG      Leukocyte Esterase, Urine Negative NEG     POC Pregnancy Urine Qual    Collection Time: 03/31/22  8:07 PM   Result Value Ref Range    Preg Test, Ur Positive (A) NEG     POC Pregnancy Urine Qual    Collection Time: 03/31/22  8:18 PM   Result Value Ref Range    Preg Test, Ur Negative NEG       MRI LUMBAR SPINE W WO CONTRAST   Final Result      1. Unremarkable lumbar spine.   2. Edema is partially seen in the bilateral sacral ala. This is likely related   to the previously seen sclerosis surrounding the sacroiliac joints. 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       MRI THORACIC SPINE W WO CONTRAST   Final Result   Unremarkable thoracic spine      MRI CERVICAL SPINE W WO CONTRAST   Final Result   Moderate spinal stenosis at C5-6, greater to the  right. There is severe right   and mild left neural foraminal narrowing. Mild degenerative edema surrounds this   level. 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      CT ABDOMEN PELVIS W IV CONTRAST Additional Contrast? None   Final Result   Addendum (preliminary) 1 of 1   Addendum: Given the edema on MRI and the degree of sclerosis in the sacral    ala,   the findings concerning the sacroiliac joints are more likely related to   sacroiliitis than osteitis condensans ilii.       Final   No evidence of acute process.          The exam and treatment you received in the Emergency Department were for an urgent problem and are not intended  as complete care. It is important that you follow up with a doctor, nurse practitioner, or physician assistant for ongoing care. If your symptoms become worse or you do not improve as expected and you are unable to reach your usual health care provider, you should return to the Emergency Department. We are available 24 hours a day.    Please take your discharge instructions with you when you go to your follow-up appointment.     If a prescription has been provided, please have it filled as soon as possible to prevent a delay in treatment. Read the entire medication instruction sheet provided to you by the pharmacy. If you have any questions or reservations about taking the medication due to side effects or interactions with other medications, please call your primary care physician or contact the ER to speak with the charge nurse.     Please make an appointment with your family doctor or the physician you were referred to for follow-up of this visit as instructed on your discharge paperwork. Return to the ER if you are unable to be seen or if you are unable to be seen in a timely manner.    Should you experience abdominal pain lasting greater than 6 hours, chest pain, difficulty breathing, fever/chills, numbness/tingling, skin changes or other symptoms that concern you, return to the ED sooner. If you feel worse over the next 24 hours, please return to the ED. We are available 24 hours a day. Thank you for trusting [place] with your care!

## 2022-03-31 NOTE — ED Notes (Signed)
Patients POC pregnancy was not positive. I made a missclick on the glucometer. Charting for confirmation that it was negative

## 2022-03-31 NOTE — ED Triage Notes (Signed)
RN called to waiting room from registration stating pt passed out in the waiting room, pt found on floor of lobby and assisted on to stretcher, Dr. Ysidro Evert to triage to see, new VS obtained, pt states she has a history of passing out due to her pain

## 2022-03-31 NOTE — ED Notes (Signed)
Consult called @948p$  for Neurosurgery

## 2022-03-31 NOTE — ED Provider Notes (Signed)
[facility] DEPT  EMERGENCY DEPARTMENT ENCOUNTER       Pt Name: Christina Stevens  MRN: [id number]  [place] [birth year]  Date of evaluation: 03/31/2022  Provider: Delos Haring, PA-C   PCP: Sherley Bounds, APRN - NP  Note Started: 5:11 PM EST 03/31/22     CHIEF COMPLAINT       Chief Complaint   Patient presents with    Back Pain     Lower back pain x 3 days, pt states she has been painting her house and thought it was strained but pain is getting worse, she ambulatory with limp to triage, tearful due to pain, states pain is in lower part of back and recently diagnosed with gastroparesis and having stomach bloating     HISTORY OF PRESENT ILLNESS: 1 or more elements      History From: Patient  HPI Limitations: None     Christina Stevens is a 42 y.o. female who presents to the ED with 3 day complaint of persistent worsening back pain, 2-3 episodes vomiting from pain daily, and syncopal episodes d/t pain.     Reviewed PMH, PSH, medications, allergies with patient.      Nursing Notes were all reviewed and agreed with or any disagreements were addressed in the HPI.     REVIEW OF SYSTEMS      Review of Systems   Constitutional:  Positive for chills, diaphoresis and fever. Negative for appetite change, fatigue and unexpected weight change.   HENT: Negative.     Eyes: Negative.    Respiratory:  Positive for cough and shortness of breath (pleuritic CP). Negative for chest tightness.    Cardiovascular:  Negative for chest pain, palpitations and leg swelling.   Gastrointestinal:  Positive for abdominal distention (2 days ago), abdominal pain (L midabdomen), blood in stool (dark specks; following GI regarding this) and vomiting. Negative for constipation, diarrhea and nausea.   Endocrine: Negative.    Genitourinary:  Positive for difficulty urinating (urinary retention). Negative for decreased urine volume, dyspareunia, dysuria, enuresis, flank pain, frequency, hematuria, pelvic pain and urgency.   Musculoskeletal:  Positive for  back pain. Negative for arthralgias, gait problem, joint swelling, myalgias, neck pain and neck stiffness.   Skin:  Negative for color change, pallor, rash and wound.        Pt has multiple tattoos.   Allergic/Immunologic: Negative.    Neurological:  Positive for syncope (reports this is her normal response to extreme pain and has happened many times in the past), weakness and numbness. Negative for dizziness, tremors, facial asymmetry, light-headedness and headaches.        Reports b/l (L > R) intermittent n/t/w of LE's x 5 years.    Hematological: Negative.    Psychiatric/Behavioral: Negative.       Positives and Pertinent negatives as per HPI.    PAST HISTORY     Past Medical History:  Past Medical History:   Diagnosis Date    ADHD     [facility] Patient infection     MRSA (methicillin resistant staph aureus) culture positive     MS (congenital mitral stenosis)     PCOS (polycystic ovarian syndrome)      Past Surgical History:  Past Surgical History:   Procedure Laterality Date    APPENDECTOMY      COLONOSCOPY N/A 03/17/2022    COLONOSCOPY W/ ENDOSCOPIC MUCOSAL RESECTION performed by Ruthe Mannan, MD at [facility]  Family History:  No family history on file.    Social History:  Social History     Tobacco Use    Smoking status: Every Day    Smokeless tobacco: Never   Substance Use Topics    Alcohol use: No    Drug use: Yes     Types: Marijuana Sherrie Mustache)     Allergies:  Allergies   Allergen Reactions    Oxycodone-Acetaminophen Itching     CURRENT MEDICATIONS      Discharge Medication List as of 03/31/2022 11:06 PM        CONTINUE these medications which have NOT CHANGED    Details   pantoprazole (PROTONIX) 20 MG tablet Take 1 tablet by mouth dailyHistorical Med      acetaminophen (TYLENOL) 325 MG tablet Take 2 tablets by mouth every 6 hours as neededHistorical Med      amphetamine-dextroamphetamine (ADDERALL) 20 MG tablet Take 2 tablets by mouth daily.Historical Med       atorvastatin (LIPITOR) 20 MG tablet Take 1 tablet by mouth dailyHistorical Med      ipratropium-albuterol (DUONEB) 0.5-2.5 (3) MG/3ML SOLN nebulizer solution Inhale 3 mLs into the lungs every 4 hours as neededHistorical Med           SCREENINGS               No data recorded        PHYSICAL EXAM      ED Triage Vitals [03/31/22 1356]   Enc Vitals Group      BP 125/86      Pulse 86      Respirations 18      Temp 98.6 F (37 C)      Temp src       SpO2 99 %      Weight - Scale 73.4 kg (161 lb 13.1 oz)      Height       Head Circumference       Peak Flow       Pain Score       Pain Loc       Pain Edu?       Excl. in [place]?       Physical Exam  Vitals and nursing note reviewed.   Constitutional:       General: She is not in acute distress.     Appearance: Normal appearance. She is normal weight. She is not ill-appearing, toxic-appearing or diaphoretic.      Comments: Patient is a 42 year old Caucasian female who is laying on right side in hospital bed in no acute distress but obvious pain.  Patient not ill-appearing or toxic-appearing.  Patient vital signs stable.  ANO x 4.   HENT:      Head: Normocephalic and atraumatic.      Mouth/Throat:      Mouth: Mucous membranes are moist.      Pharynx: Oropharynx is clear.   Eyes:      General: No scleral icterus.        Right eye: No discharge.         Left eye: No discharge.      Extraocular Movements: Extraocular movements intact.      Conjunctiva/sclera: Conjunctivae normal.      Pupils: Pupils are equal, round, and reactive to light.   Neck:      Vascular: No carotid bruit.   Cardiovascular:      Rate and Rhythm: Normal rate and regular rhythm.  Pulses: Normal pulses.      Heart sounds: Normal heart sounds. No murmur heard.     No friction rub. No gallop.   Pulmonary:      Effort: Pulmonary effort is normal. No respiratory distress.      Breath sounds: Normal breath sounds. No stridor. No wheezing, rhonchi or rales.   Chest:      Chest wall: No tenderness.   Abdominal:       General: Bowel sounds are normal. There is no distension.      Palpations: Abdomen is soft. There is no mass or pulsatile mass.      Tenderness: There is abdominal tenderness. There is no guarding or rebound. Negative signs include Murphy's sign.      Hernia: No hernia is present.       Musculoskeletal:         General: Tenderness present. No swelling, deformity or signs of injury.        Arms:       Cervical back: Normal range of motion and neck supple. No swelling, edema, deformity, erythema, signs of trauma, lacerations, rigidity, spasms, torticollis, tenderness, bony tenderness or crepitus. No pain with movement. Normal range of motion.      Thoracic back: Tenderness and bony tenderness present. No swelling, edema, deformity, signs of trauma, lacerations or spasms. Normal range of motion. No scoliosis.      Lumbar back: Tenderness present. No swelling, edema, deformity, signs of trauma, lacerations, spasms or bony tenderness. Decreased range of motion (due to pain). No scoliosis.      Right lower leg: No edema.      Left lower leg: No edema.      Comments: ROM exam limited d/t pt 10/10 pain.    Lymphadenopathy:      Cervical: No cervical adenopathy.   Skin:     General: Skin is warm and dry.      Capillary Refill: Capillary refill takes less than 2 seconds.      Coloration: Skin is not jaundiced or pale.      Findings: No bruising, erythema, lesion or rash.   Neurological:      Mental Status: She is alert and oriented to person, place, and time.      GCS: GCS eye subscore is 4. GCS verbal subscore is 5. GCS motor subscore is 6.      Gait: Gait is intact.      Deep Tendon Reflexes: Reflexes are normal and symmetric. Babinski sign absent on the right side. Babinski sign absent on the left side.      Reflex Scores:       Brachioradialis reflexes are 2+ on the right side and 2+ on the left side.       Patellar reflexes are 2+ on the right side and 2+ on the left side.       Achilles reflexes are 2+ on the right  side and 2+ on the left side.     Comments: Neuro exam of C5-L2 revealed motor 5/5+ & strength 5/5+ in RUE but strength 4/5+ in LUE with possible T1/ulnar nn involvement, sensation intact to light touch in median, radial, and ulnar nn distributions.  Radial and ulnar pulses 2+ b/l Ue's.   Neuro exam of L5-S1 revealed motor 5/5+, strength 5/5+, sensation intact to light touch.  Dorsalis pedis and posterior tibialis 2+ bilateral lower extremity. Pt reports slight decreased sensation in L L3/L4 nn distribution.     Negative Hoffman's b/l, neg babinski  b/l, neg clonus b/l LE.    Psychiatric:         Mood and Affect: Mood normal.         Behavior: Behavior normal.       DIAGNOSTIC RESULTS   LABS:     Recent Results (from the past 24 hour(s))   CBC with Diff    Collection Time: 03/31/22  2:09 PM   Result Value Ref Range    WBC 5.5 3.6 - 11.0 K/uL    RBC 4.60 3.80 - 5.20 M/uL    Hemoglobin 14.6 11.5 - 16.0 g/dL    Hematocrit 42.8 35.0 - 47.0 %    MCV 93.0 80.0 - 99.0 FL    MCH 31.7 26.0 - 34.0 PG    MCHC 34.1 30.0 - 36.5 g/dL    RDW 11.6 11.5 - 14.5 %    Platelets 280 150 - 400 K/uL    MPV 10.0 8.9 - 12.9 FL    Nucleated RBCs 0.0 0 PER 100 WBC    nRBC 0.00 0.00 - 0.01 K/uL    Neutrophils % 65 32 - 75 %    Lymphocytes % 17 12 - 49 %    Monocytes % 14 (H) 5 - 13 %    Eosinophils % 4 0 - 7 %    Basophils % 0 0 - 1 %    Immature Granulocytes 0 0.0 - 0.5 %    Neutrophils Absolute 3.6 1.8 - 8.0 K/UL    Lymphocytes Absolute 1.0 0.8 - 3.5 K/UL    Monocytes Absolute 0.8 0.0 - 1.0 K/UL    Eosinophils Absolute 0.2 0.0 - 0.4 K/UL    Basophils Absolute 0.0 0.0 - 0.1 K/UL    Absolute Immature Granulocyte 0.0 0.00 - 0.04 K/UL    Differential Type AUTOMATED     CMP    Collection Time: 03/31/22  2:09 PM   Result Value Ref Range    Sodium 136 136 - 145 mmol/L    Potassium 3.7 3.5 - 5.1 mmol/L    Chloride 106 97 - 108 mmol/L    CO2 24 21 - 32 mmol/L    Anion Gap 6 5 - 15 mmol/L    Glucose 87 65 - 100 mg/dL    BUN 6 6 - 20 MG/DL    Creatinine  0.91 0.55 - 1.02 MG/DL    Bun/Cre Ratio 7 (L) 12 - 20      Est, Glom Filt Rate >60 >60 ml/min/1.36m    Calcium 9.0 8.5 - 10.1 MG/DL    Total Bilirubin 0.4 0.2 - 1.0 MG/DL    ALT 14 12 - 78 U/L    AST 12 (L) 15 - 37 U/L    Alk Phosphatase 69 45 - 117 U/L    Total Protein 7.5 6.4 - 8.2 g/dL    Albumin 3.8 3.5 - 5.0 g/dL    Globulin 3.7 2.0 - 4.0 g/dL    Albumin/Globulin Ratio 1.0 (L) 1.1 - 2.2     Lipase    Collection Time: 03/31/22  2:09 PM   Result Value Ref Range    Lipase 26 13 - 75 U/L   HCG Qualitative, Serum    Collection Time: 03/31/22  2:09 PM   Result Value Ref Range    hCG Qual Negative NEG     TSH + Free T4 Panel    Collection Time: 03/31/22  5:24 PM   Result Value Ref Range    TSH, 3RD GENERATION  1.37 0.36 - 3.74 uIU/mL    T4 Free 1.0 0.8 - 1.5 NG/DL   Urinalysis    Collection Time: 03/31/22  8:06 PM   Result Value Ref Range    Color, UA YELLOW/STRAW      Appearance CLEAR CLEAR      Specific Gravity, UA <1.005 1.003 - 1.030    pH, Urine 6.0 5.0 - 8.0      Protein, UA Negative NEG mg/dL    Glucose, UA Negative NEG mg/dL    Ketones, Urine 15 (A) NEG mg/dL    Bilirubin Urine Negative NEG      Blood, Urine Negative NEG      Urobilinogen, Urine 1.0 0.2 - 1.0 EU/dL    Nitrite, Urine Negative NEG      Leukocyte Esterase, Urine Negative NEG     POC Pregnancy Urine Qual    Collection Time: 03/31/22  8:07 PM   Result Value Ref Range    Preg Test, Ur Positive (A) NEG     POC Pregnancy Urine Qual    Collection Time: 03/31/22  8:18 PM   Result Value Ref Range    Preg Test, Ur Negative NEG       EKG: When ordered, EKG's are interpreted by the Emergency Department Physician in the absence of a cardiologist.  Please see their note for interpretation of EKG.      RADIOLOGY:  Non-plain film images such as CT, Ultrasound and MRI are read by the radiologist. Interpretation per the Radiologist below, if available at the time of this note:     CT ABDOMEN PELVIS W IV CONTRAST Additional Contrast? None    Addendum Date:  03/31/2022    Addendum: Given the edema on MRI and the degree of sclerosis in the sacral ala, the findings concerning the sacroiliac joints are more likely related to sacroiliitis than osteitis condensans ilii.     Result Date: 03/31/2022  EXAM: CT ABDOMEN PELVIS W IV CONTRAST INDICATION: pain COMPARISON: 11/20/2020 CONTRAST: 100 mL of Isovue-370. ORAL CONTRAST: None TECHNIQUE: Following intravenous administration of contrast, thin axial images were obtained through the abdomen and pelvis. Coronal and sagittal reconstructions were generated. CT dose reduction was achieved through use of a standardized protocol tailored for this examination and automatic exposure control for dose modulation. FINDINGS: LOWER THORAX: No significant abnormality in the incidentally imaged lower chest. LIVER: No mass. BILIARY TREE: Gallbladder is within normal limits. CBD is not dilated. SPLEEN: within normal limits. PANCREAS: No mass or ductal dilatation. ADRENALS: Unremarkable. KIDNEYS: No mass, calculus, or hydronephrosis. STOMACH: Unremarkable. SMALL BOWEL: No dilatation or wall thickening. COLON: No dilatation or wall thickening. APPENDIX: Status post appendectomy. PERITONEUM: No ascites or pneumoperitoneum. RETROPERITONEUM: No lymphadenopathy or aortic aneurysm. REPRODUCTIVE ORGANS: Within normal limits. URINARY BLADDER: No mass or calculus. BONES: No destructive bone lesion. 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 ABDOMINAL WALL: No mass or hernia. ADDITIONAL COMMENTS: N/A     No evidence of acute process.    MRI LUMBAR SPINE W WO CONTRAST    Result Date: 03/31/2022  EXAM: MRI LUMBAR SPINE W WO CONTRAST INDICATION: back pain and incontinence. COMPARISON: CT 11/17/2020 TECHNIQUE: MR imaging of the lumbar spine was performed using the following sequences: sagittal T1, T2, STIR;  axial T1, T2 prior to and following contrast administration. CONTRAST: mL of ProHance. FINDINGS: There is normal alignment of the lumbar spine. Vertebral  body heights are maintained. Edema is partially seen in the bilateral sacral ala. The conus medullaris terminates at .  Signal and caliber of the distal spinal cord are within normal limits. There is no pathologic intrathecal enhancement. The paraspinal soft tissues are within normal limits. Lower thoracic spine: No herniation or stenosis. L1-L2: No herniation or stenosis. L2-L3: No herniation or stenosis. L3-L4: No herniation or stenosis. L4-L5: No herniation or stenosis. L5-S1: No herniation or stenosis.      1. Unremarkable lumbar spine. 2. Edema is partially seen in the bilateral sacral ala. This is likely related to the previously seen sclerosis surrounding the sacroiliac joints. Given the edema and the degree of sclerosis in the sacral ala, this is more likely related to sacroiliitis than osteitis condensans ilii.     MRI CERVICAL SPINE W WO CONTRAST    Result Date: 03/31/2022  EXAM: MRI CERVICAL SPINE W WO CONTRAST INDICATION: left arm weak. COMPARISON: None TECHNIQUE: MR imaging of the cervical spine was performed using the following sequences: sagittal T1, T2, STIR;  axial T2, T1 prior to and following contrast administration. CONTRAST:  15 mL of ProHance. FINDINGS: There is normal alignment of the cervical spine. Vertebral body heights are maintained. Mild edema surrounds C5-6. The craniocervical junction is intact. There is mildly increased patchy signal in the cord centered at the C5-6 level. There is no pathologic intrathecal enhancement. The paraspinal soft tissues are within normal limits. C2-C3: No herniation or stenosis. C3-C4: No herniation or stenosis. C4-C5: No herniation or stenosis. C5-C6: Mild disc space narrowing. Mild broad-based disc osteophyte complex that is asymmetric to the right. There is moderate spinal stenosis, greater towards the right. There is severe right and mild left neural femoral narrowing. C6-C7: No herniation or stenosis.  C7-T1: No herniation or stenosis.     Moderate  spinal stenosis at C5-6, greater to the right. There is severe right and mild left neural foraminal narrowing. Mild degenerative edema surrounds this level. There is indistinct patchy increased STIR signal in the adjacent cord likely due to myelomalacia.    MRI THORACIC SPINE W WO CONTRAST    Result Date: 03/31/2022  EXAM: MRI THORACIC SPINE W WO CONTRAST INDICATION: Back pain. COMPARISON: CT 11/18/2018 TECHNIQUE: MR imaging of the thoracic spine was performed using the following sequences: sagittal T1, T2, stir; axial T1, T2 prior to and following contrast administration. CONTRAST: 15 mL of ProHance. FINDINGS: There is normal alignment of the thoracic spine. Vertebral body heights are maintained. Marrow signal is normal. The course, caliber, and signal intensity of the spinal cord are normal. There is no pathologic intrathecal enhancement. The paraspinal soft tissues are within normal limits. There is no herniation or stenosis.     Unremarkable thoracic spine      PROCEDURES   Unless otherwise noted below, none  Procedures     CRITICAL CARE TIME   Patient does not meet Critical Care Time, 0 minutes     EMERGENCY DEPARTMENT COURSE and DIFFERENTIAL DIAGNOSIS/MDM   Vitals:    Vitals:    03/31/22 1730 03/31/22 2200 03/31/22 2230 03/31/22 2300   BP: 124/85 115/78 128/82 122/85   Pulse:  67  72   Resp:       Temp:  98.4 F (36.9 C)     TempSrc:  Oral     SpO2: 100% 94%  96%   Weight:         Patient was given the following medications:  Medications   morphine sulfate (PF) injection 4 mg (4 mg IntraVENous Given 03/31/22 1725)   ondansetron (ZOFRAN) injection 4 mg (4 mg IntraVENous Given  03/31/22 1725)   diazePAM (VALIUM) injection 2.5 mg (2.5 mg IntraVENous Given 03/31/22 1829)   iopamidol (ISOVUE-370) 76 % injection 100 mL (100 mLs IntraVENous Given 03/31/22 1850)   gadoteridol (PROHANCE) injection 15 mL (15 mLs IntraVENous Given 03/31/22 1938)   ketorolac (TORADOL) injection 15 mg (15 mg IntraVENous Given 03/31/22 2101)    ondansetron (ZOFRAN) injection 4 mg (4 mg IntraVENous Given 03/31/22 2219)   morphine sulfate (PF) injection 4 mg (4 mg IntraVENous Given 03/31/22 2219)     CONSULTS: (Who and What was discussed)  IP CONSULT TO NEUROSURGERY    Chronic Conditions:  has a past medical history of ADHD, Epstein Barr infection, MRSA (methicillin resistant staph aureus) culture positive, MS (congenital mitral stenosis), and PCOS (polycystic ovarian syndrome).    Social Determinants affecting Dx or Tx: None    Records Reviewed (source and summary of external records): Nursing Notes, Old Medical Records, Previous electrocardiograms, Previous Radiology Studies, and Previous Laboratory Studies    CC/HPI Summary, DDx, ED Course, and Reassessment:     Pt is a 42yo female who presents to the ED with 3 day complaint of persistent worsening back pain, 2-3 episodes vomiting from pain daily, and syncopal episodes d/t pain.  Patient states the pain started 3 days ago and she attributed it to the remodeling and painting of her home but it has since gotten progressively worse.  Reports syncope and vomiting due to extreme pain and this has happened with other issues in the past.  Patient states pain is a 10/10 on pain scale, constantly throbbing/pulsing in nature with intermittent shooting pain that shoots up and down her back around to the left side of her abdomen and down her left leg to the level of her knee.  Patient states she also noticed 2 days ago that her tummy felt distended and felt like "ridges."  Endorses bowel incontinence since her colonoscopy with EMR on 03/17/2022.  Endorses urinary retention but denies incontinence.  Endorses fever/chills/sweats with highest at home temperature of 101, cough, shortness of breath, pleuritic pain with the abdominal pain of her left mid abdomen.  Denies urinary symptoms including frequency, urgency, pain/burning/irritation or hematuria.  Endorses dark almost black specks with bowel movements-has been  seeing gastroenterology for this reason but states that this has persisted.  Denies muscle/body aches, dizziness/lightheadedness/headaches. Infectious disease following pt for hair loss and skin ulcerations. Denies history of IV drug use.    PMH ADD, GERD, gastroparesis, HLD, PCOS, and unknown neurologic condition. H/o appendectomy. Meds include pantoprazole, adderall, atorvastatin, medical marijuana. Allergy to percocet reports hives. Pt also reports issues with decadron causing psychologic changes.     Ddx is broad but includes workup to rule out cauda equina, spinal cord injury or impingement, spinal epidural abscess, causes of acute abdomen including cholecystitis, SBO/ileus, pancreatitis, diverticulitis, or worsening gastroparesis.     Will check basic labs (CBC, CMP, Mag) to rule out infection, metabolic derangements, anemia.   Will check lipase to evaluate for possible acute pancreatitis.  Will check CT A/P w/contrast to rule out pancreatitis, SBO/ileus, appendicitis, cholecystitis, diverticulitis and other causes of possible acute abdomen.   Will order MRI to r/o cauda equina, SEA, other nn impingement/spinal cord injuries given pt hx and s/s.   Will check TSH/T4 for acute thyroid changes.    Ordered '4mg'$  morphine with '4mg'$  zofran for pain. Will reassess shortly after.     ED Course as of 04/01/22 0042   Wed Mar 31, 2022   1710 CBC,  CMP, lipase unremarkable.  [NB]   [id number] Pt reported to nurse concern for mri given severe claustrophobia. BP stable after morphine/zofran. Will administer diazepam 2.5 mg IV.  [NB]   1820 Hcg qual neg.  [NB]   1940 CT abd/pelv revealed no evidence of acute processes. 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 [NB]   2023 MRI C spine Moderate spinal stenosis at C5-6, greater to the right. There is severe right  and mild left neural foraminal narrowing. Mild degenerative edema surrounds this  level. There is indistinct patchy increased STIR signal in the adjacent  cord  likely due to myelomalacia.   [NB]   2023 MRI Tspine-Unremarkable thoracic spine. [NB]   2035 Reassessment of pt. Pain still 6-7/10 after laying in MRI for >1hr. Will order toradol. [NB]   2041 MRI L-spine- 1. Unremarkable lumbar spine.  2. Edema is partially seen in the bilateral sacral ala. This is likely related  to the previously seen sclerosis surrounding the sacroiliac joints. 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   [NB]   2041 UA unremarkable.  [NB]   2139 DRE revealed good rectal tone. Pending post-void bladder scan. Paged consult for neurosurgery.  [NB]   2140 TSH 1.37. free T4 1.0. WNL. Post-void scan 85m- WNL. [NB]   243Consult with NS Dr. MSimonne Maffucciregarding pt MRI and presentation. At this time, Dr. MSimonne Maffuccistates pt is non-emergent and can f/u in his office first thing this week.  [NB]   2215 Reassessment of pt, pt still in 8/10 pain. Offered additional morphine or first dose norco. Pt preferred morphine. Will administer and recheck VS in 20-30 minutes. Pt moving well. Repeat neuro exam intact and unchanged. VS currently stable.  [NB]      ED Course User Index  [NB] BDelos Haring PA-C     Disposition Considerations (Tests not done, Shared Decision Making, Pt Expectation of Test or Tx.):     Acute low back pain with radiculopathy and sacroiliitis most likely. Ruled out cauda equina, SEA. Ruled out acute abdomen with neg CT. Thyroid labs and UA unremarkable. Final reassessment of pt at 2300, VS stable, pt reports pain decreased to ~4/10 on pain scale. Discussed MRI with Dr. MSimonne Maffucciand completed additional neuro exam tests- pt non-emergent and he recommended f/u in his office this week. Sent pt home with norco for pain management and miralax to avoid constipation. Offered steroid taper pack but pt declined due to issues with decadron in the past. Discussed strict return precautions. Pt to f/u with  PCP within the next week for re-eval as well. Pt and wife expressed understanding and agreed to tx plan & followup as recommended. All questions answered.     FINAL IMPRESSION     1. Sacroiliitis ([employer]    2. Acute bilateral low back pain with left-sided sciatica    3. Incontinence of feces, unspecified fecal incontinence type       DISPOSITION/PLAN   DISPOSITION Decision To Discharge 03/31/2022 11:06:38 PM    Discharge Note: The patient is stable for discharge home. The signs, symptoms, diagnosis, and discharge instructions have been discussed, understanding conveyed, and agreed upon. The patient is to follow up as recommended or return to ER should their symptoms worsen.      PATIENT REFERRED TO:  CSherley Bounds APRN - NP  [id number]  [phone]    Schedule an appointment as soon as possible for a visit in 1 week  for re-evaluation and followup from ED visit.    Melisi, Hunt Oris, MD  [address]  [place] [address]  [phone]    Schedule an appointment as soon as possible for a visit in 1 day  For re-evaluation of back pain and bowel incontinence.    [facility]  [address]  [place]  [phone]    As needed, If symptoms worsen       DISCHARGE MEDICATIONS:     Medication List        START taking these medications      HYDROcodone-acetaminophen 5-325 MG per tablet  Commonly known as: Norco  Take 1 tablet by mouth every 6 hours as needed for Pain for up to 3 days. Intended supply: 3 days. Take lowest dose possible to manage pain Max Daily Amount: 4 tablets     polyethylene glycol 17 GM/SCOOP powder  Commonly known as: GLYCOLAX  Take 17 g by mouth daily as needed (constipation)            ASK your doctor about these medications      acetaminophen 325 MG tablet  Commonly known as: TYLENOL     amphetamine-dextroamphetamine 20 MG tablet  Commonly known as: ADDERALL     atorvastatin 20 MG tablet  Commonly known as: LIPITOR      ipratropium 0.5 mg-albuterol 2.5 mg 0.5-2.5 (3) MG/3ML Soln nebulizer solution  Commonly known as: DUONEB     pantoprazole 20 MG tablet  Commonly known as: PROTONIX               Where to Get Your Medications        These medications were sent to [facility] [address], [place]  [address] [place] [address]      Phone: [phone]   HYDROcodone-acetaminophen 5-325 MG per tablet  polyethylene glycol 17 GM/SCOOP powder        DISCONTINUED MEDICATIONS:  Discharge Medication List as of 03/31/2022 11:06 PM        I have discussed the history and physical, results, MDM, and treatment plan with my supervising physician, Seward Speck, MD, who agrees with my plan.     I am the Primary Clinician of Record.   Delos Haring, PA-C (electronically signed)    (Please note that parts of this dictation were completed with voice recognition software. Quite often unanticipated grammatical, syntax, homophones, and other interpretive errors are inadvertently transcribed by the computer software. Please disregards these errors. Please excuse any errors that have escaped final proofreading.)       Delos Haring, PA-C  04/01/22 0103

## 2022-04-01 LAB — TSH + FREE T4 PANEL
T4 Free: 1 NG/DL (ref 0.8–1.5)
TSH, 3RD GENERATION: 1.37 u[IU]/mL (ref 0.36–3.74)

## 2022-04-01 LAB — URINALYSIS
Bilirubin Urine: NEGATIVE
Blood, Urine: NEGATIVE
Glucose, UA: NEGATIVE mg/dL
Ketones, Urine: 15 mg/dL — AB
Leukocyte Esterase, Urine: NEGATIVE
Nitrite, Urine: NEGATIVE
Protein, UA: NEGATIVE mg/dL
Specific Gravity, UA: <1.005 (ref 1.003–1.030)
Urobilinogen, Urine: 1 EU/dL (ref 0.2–1.0)
pH, Urine: 6 (ref 5.0–8.0)

## 2022-04-01 LAB — POC PREGNANCY UR-QUAL
Preg Test, Ur: NEGATIVE
Preg Test, Ur: POSITIVE — AB

## 2022-04-01 MED ORDER — GADOTERIDOL 279.3 MG/ML IV SOLN
279.3 | Freq: Once | INTRAVENOUS | Status: AC | PRN
Start: 2022-04-01 — End: 2022-03-31
  Administered 2022-04-01: 01:00:00 15 mL via INTRAVENOUS

## 2022-04-01 MED ORDER — POLYETHYLENE GLYCOL 3350 17 GM/SCOOP PO POWD
17 | Freq: Every day | ORAL | 0 refills | Status: AC | PRN
Start: 2022-04-01 — End: 2022-04-30

## 2022-04-01 MED ORDER — MORPHINE SULFATE (PF) 4 MG/ML IJ SOLN
4 | INTRAMUSCULAR | Status: AC
Start: 2022-04-01 — End: 2022-03-31
  Administered 2022-04-01: 03:00:00 4 mg via INTRAVENOUS

## 2022-04-01 MED ORDER — KETOROLAC TROMETHAMINE 30 MG/ML IJ SOLN
30 | Freq: Once | INTRAMUSCULAR | Status: AC
Start: 2022-04-01 — End: 2022-03-31
  Administered 2022-04-01: 02:00:00 15 mg via INTRAVENOUS

## 2022-04-01 MED ORDER — HYDROCODONE-ACETAMINOPHEN 5-325 MG PO TABS
5-325 MG | ORAL_TABLET | Freq: Four times a day (QID) | ORAL | 0 refills | Status: AC | PRN
Start: 2022-04-01 — End: 2022-04-03

## 2022-04-01 MED ORDER — ONDANSETRON HCL 4 MG/2ML IJ SOLN
4 | Freq: Once | INTRAMUSCULAR | Status: AC
Start: 2022-04-01 — End: 2022-03-31
  Administered 2022-04-01: 03:00:00 4 mg via INTRAVENOUS

## 2022-04-01 MED FILL — KETOROLAC TROMETHAMINE 30 MG/ML IJ SOLN: 30 MG/ML | INTRAMUSCULAR | Qty: 1

## 2022-04-01 MED FILL — ONDANSETRON HCL 4 MG/2ML IJ SOLN: 4 MG/2ML | INTRAMUSCULAR | Qty: 2

## 2022-04-01 MED FILL — MORPHINE SULFATE 4 MG/ML IJ SOLN: 4 mg/mL | INTRAMUSCULAR | Qty: 1

## 2022-04-13 ENCOUNTER — Encounter

## 2022-04-23 ENCOUNTER — Inpatient Hospital Stay: Admit: 2022-04-23 | Payer: PRIVATE HEALTH INSURANCE | Attending: Orthopaedic Surgery | Primary: Primary Care

## 2022-04-23 DIAGNOSIS — M542 Cervicalgia: Secondary | ICD-10-CM

## 2022-09-27 NOTE — Patient Instructions (Signed)
 [id number] en   Fall Prevention   Falls often take place due to slipping, tripping, or losing your balance. Millions of people fall every year and injure themselves. Among older adults in the [place] falls are the most common cause of traumatic brain injuries. Every 20 minutes, an older adult dies from a fall. Here are ways to reduce your risk of falling again:   Think about your fall. Was there anything that caused your fall that can be fixed, removed, or replaced?   Make your home safe by keeping walkways clear of objects you may trip over, such as electrical cords.   Use nonslip pads under rugs. Don't use area rugs or small throw rugs.   Use nonslip mats in bathtubs and showers.   Hang grab rails by the toilet and inside and outside the shower.   Install handrails and lights on staircases. The handrails should be on both sides of the stairs.   Use night lights.   Don't walk in poorly lit areas.   Don't stand on chairs or wobbly ladders.   Use care when reaching overhead or looking up. This position can cause a loss of balance.   Be sure your shoes fit well, are in good condition, and have nonslip bottoms.    Wear shoes both inside and outside of your home. Don't go barefoot or wear slippers.   Be cautious when going up and down stairs, curbs, and when walking on uneven sidewalks.   If your balance is poor, consider using a cane or walker. Talk with your healthcare provider about having a balance assessment.   If your fall was related to alcohol use, stop or limit alcohol intake. Ask your provider for help if you think you may overuse alcohol and can't stop.   If your fall was related to use of sleeping medicines, talk with your provider about this. You may need to reduce your dosage at bedtime if you wake up during the night to go to the bathroom.     To reduce the need for nighttime bathroom trips:   Don't drink fluids for several hours before going to bed   Empty your bladder before going to bed   Men can keep  a urinal at the bedside   Stay as active as you can. Balance, flexibility, strength, and endurance all come from exercise. They all play a role in preventing falls. Ask your provider which types of activity are right for you. Try to do some type of exercise every day.   Get your eyes checked once a year or more often if your vision changes   If you have pets, know where they are before you stand up or walk so you don't trip over them.   Go over all your medicines with a pharmacist or other provider. This is to see if any of them could make you more likely to fall. Have this type of medicine review at least once every year.   If your provider advises a new medicine, ask if the side effects will affect your balance.   Don't move quickly from one position to another. For instance, don't stand up fast from sitting. This can cause dizziness and may lead to a fall.   Sit down when putting on pants, socks, and shoes. This will make you less likely to lose your balance and fall.   Always let your provider know if you have fallen since your last visit.   Contact your provider right away  if you're having balance problems or falling more often.   Last Reviewed Date: 02/02/2020    2000-2024 [employer], [place]. All rights reserved. This information is not intended as a substitute for professional medical care. Always follow your healthcare professional's instructions.

## 2022-10-27 NOTE — ED Triage Notes (Signed)
 Pt having rash to neck. Says rt side of head is swollen and breaking open. States having difficulty swallowing for the past 2 days, worsening today. Followed by infectous dx.

## 2022-10-27 NOTE — Patient Instructions (Signed)
 [id number]   Nonspecific Skin Rash   A rash is a common skin condition. Rashes can be red, itchy, swollen, dry, or painful. You may also have blisters, blotches, or welts on the skin.   A rash can occur for many reasons. And it may go away quickly on its own or with some basic home treatment. Rashes can be hard to diagnose, as they may be caused by many different conditions. But it's important to get a diagnosis, because some rashes can be a sign of something more serious, or an underlying medical problem.       What causes nonspecific skin rash?   Skin rashes often occur as a reaction to different things. Sometimes the disease-fighting cells (immune cells) in the skin are reacting to an irritant that has touched the skin. These cells may be reacting to an illness or an infection in other cases. Some of the things that can cause a rash to occur include:   Skin diseases such as eczema or psoriasis   Foods you're allergic to   Medicines you're allergic to   Too much sun exposure   Scents in lotions, soaps, laundry detergent, or makeup   Some types of fabric or clothing   Irritants such as pesticides, paints, and chemicals (strong detergent)   Infections caused by a virus (chickenpox), bacteria (strep), or fungus (ringworm)   Insect bites   Contact with certain plants (poison ivy), or allergy-causing substances such as animal dander or pollen   Nickel, a metal often used in inexpensive jewelry and in e-cigarette devices   A rash can sometimes be caused by a health condition such as:   Rheumatoid or psoriatic arthritis   Lupus   [place] disease       Symptoms of nonspecific skin rash   The symptoms of skin rashes can vary but may include:   Spots   Bumps   Itching   Red patches that may be swollen   Dry skin   Blisters   Welts   Blotches       Treatment for nonspecific skin rash   Many skin rashes get better with home treatment. Here are some tips:   Try an over-the-counter hydrocortisone cream on the affected area  if the rash is small.   Take an over-the-counter antihistamine to ease itching.   Don't scratch the skin.   Wash your skin with mild soap and warm water. Don't use harsh soaps or scented lotions.   Gently pat your skin dry. Don't rub the skin.   Wear cotton clothing.   Take a soothing bath with lukewarm water.   Your healthcare provider may prescribe medicines if home treatment doesn't help:   Steroid creams or ointments   Anti-inflammatory medicines (corticosteroids) taken by mouth   Many other medicines and treatments       When to call your healthcare provider   Call your healthcare provider or seek medical care right away if any of these occur:   The rash appears after taking a new medicine.   The rash doesn't go away, or spreads to other areas.   You have a rash and a fever of 100.4?F (38?C) or higher, or as advised by your provider.   Your child has a rash and a fever (see Fever and children, below).   The rash is painful.   The rash is on your face or genitals.   The rash occurs suddenly and spreads quickly.   The rash looks infected.  You have swelling, pain, fluid, crusting, or warmth at the rash site. Or you have a red line coming from the rash.       Fever and children   Use a digital thermometer to check your child's temperature. Don't use a mercury thermometer. There are different kinds and uses of digital thermometers. They include:   Rectal. For children younger than 3 years, a rectal temperature is the most accurate.   Forehead (temporal). This works for children age 6 months and older. If a child under 3 months old has signs of illness, this can be used for a first pass. The provider may want to confirm with a rectal temperature.   Ear (tympanic). Ear temperatures are accurate after 50 months of age, but not before.   Armpit (axillary). This is the least reliable but may be used for a first pass to check a child of any age with signs of illness. The provider may want to confirm with a rectal  temperature.   Mouth (oral). Don't use a thermometer in your child's mouth until they are at least 44 years old.   Use the rectal thermometer with care. Follow the product maker's directions for correct use. Insert it gently. Label it and make sure it's not used in the mouth. It may pass on germs from the stool. If you don't feel OK using a rectal thermometer, ask the healthcare provider what type to use instead. When you talk with any healthcare provider about your child's fever, tell them which type you used.   Below are guidelines to know if your young child has a fever. Your child's healthcare provider may give you different numbers for your child. Follow your provider's specific instructions.   Fever readings for a baby under 3 months old:    First, ask your child's healthcare provider how you should take the temperature.   Rectal or forehead: 100.4?F (38?C) or higher   Armpit: 99?F (37.2?C) or higher   Fever readings for a child age 33 months to 12 months (3 years):   Rectal, forehead, or ear: 102?F (38.9?C) or higher   Armpit: 101?F (38.3?C) or higher   Call the healthcare provider in these cases:   Repeated temperature of 104?F (40?C) or higher in a child of any age   Fever of 100.4?F (38?C) or higher in baby younger than 3 months   Fever that lasts more than 24 hours in a child under age 64   Fever that lasts for 3 days in a child age 64 or older     Last Reviewed Date: 08/01/2020 00:00:00   ? 2000-2024 [employer], [place]. All rights reserved. This information is not intended as a substitute for professional medical care. Always follow your healthcare professional's instructions.

## 2022-10-27 NOTE — ED Provider Notes (Signed)
 -------------------------------------------------------------------------------  Attestation signed by Alm Sar, MD at 10/28/2022  5:20 PM  Teaching Provider Attestation: I personally saw and evaluated the patient. I repeated the critical/key portions of the service and discussed with the resident.  I have reviewed the resident's documentation and edited as appropriate.   Date of service if different than date of resident service:    Alm Sar, MD    -------------------------------------------------------------------------------         Final diagnoses:   None       HPI     Chief Complaint   Patient presents with   . Dysphagia   . Rash       HPI  42 year old female with history of PCOS, gastroparesis, parotid abscess, and weakness of left leg presenting with 3 days of right side of head pruritus, anterior neck rash, and subjective fever.  She has also had several months of hair loss on the right side of her head.    She describes the rash on her anterior neck as a burning.    She notes no new exposures, changes in detergents or soaps, recent outdoor or wilderness activities, or previous episodes of similar.    She notes family history of multiple autoimmune diseases, though she herself has not been diagnosed with an autoimmune disease.    She has been seen by infectious disease specialist who is following her case.  He started on ketoconazole shampoo which has not improved her symptoms.           ED Course & MDM     ED Course as of 10/27/22 2225   Wed Oct 27, 2022   1931 Provider in Triage Note  Patient information was obtained from patient, caregiver / friend, and/or individuals accompanying patient.   History/Exam limitations: if any, see HPI.    HPI: 42 year old female Zentz complaining of painful neck rash and difficulty swallowing, onset in the past 24 to 36 hours.  Currently undergoing workup with infectious disease.  Endorses generalized myalgias    Physical Exam:  Triage Vital Signs Reviewed.   General:   mild distress  Additional PE Information: There is thickened midline dermis in her neck without fluctuance underneath, no Nikolsky sign, no submandibular fullness.  Apiolecia she is also noted    MDM/Visit Events: Does not appear to be herpes zoster, possibly dermatomyositis so we will order up inflammatory markers.  Also will do CBC with differential.  Will plan on getting back to the acute treatment area.  Is hemodynamically stable with airway intact.    Please See Labs, Imaging, Medication orders for details. Please see nursing orders and documentation for details.     Additional Notes:  This patient was screened and had a rapid focused assessment by the provider in triage.  Patient has been advised that this is a rapid assessment to initiate their medical screening exam rather than definitive emergency medical care.  Further evaluation and necessary treatment will be assumed in a main treatment area by another provider unless this provider determines the patient may be safely discharged or otherwise dispositioned from triage.         [CH]   2220 Pt has no readily ID cause of diffuse pain. Gave robaxin  for myalgias. Pt states she needs parental narcotics. That's why she was sent in for IV pain management. Pt has nml labs here will send home with 1% hydrocort and some robaxin  for pain augmentation. I explained to the pt I can not just giver her IV  narcotics for diffuse pain without cause. Her and visitor were visible upset. Pt stated then why the fuck am I here [facility]. Visitor stated she wants to leave immediatly they would contact their doctor. Pt asking visitor to pull IV out.  [DE]      ED Course User Index  [CH] Lonni Favre, MD  [DE] Alm Sar, MD         Diagnosis as of 10/27/22 2225   Rash and other nonspecific skin eruption       Medical Decision Making  Risk  Prescription drug management.      42 year old female with history of PCOS, gastroparesis, parotid abscess, and  weakness of left leg presenting with 3 days of right side of head pruritus, anterior neck rash, and subjective fever.     Patient remained afebrile and hemodynamically stable throughout clinical course.  Labs reassuring with no indication of systemic inflammatory process.  Erythematous plaque on anterior neck could be due to psoriasis, though may be an allergic reaction to an irritant.  Not concerning for cellulitis.  Scabs to head or alopecia present likely due to patient scratching.  No lice noted.  Multimodal pain regimen tried with NSAIDs, muscle relaxants, hydroxyzine, and steroid cream.  Patient noted frustration with lack of significant improvement to pain.  Patient is being followed by infectious disease specialist, given also referrals to rheumatology and dermatology.  Patient discharged with p.o. analgesics/muscle relaxant, steroid cream, follow-up, and return precautions.                                          SAFE-T Assessment              Patient History         Diagnosis Date   . ADHD (attention deficit hyperactivity disorder)    . Bacterial pneumonia     Oct 2022   . Bell palsy 2011   . Difficulty walking    . Gastroparesis 2023   . Headache    . Headache, tension-type    . Insomnia    . Low back pain    . Memory loss    . Myelomalacia (CMS/HCC) 2023   . Numbness    . Parotid abscess    . PCOS (polycystic ovarian syndrome)    . Peripheral neuropathy    . RSV infection     Oct 2022   . Syncope    . Weakness of limb        Past Surgical History:   Procedure Laterality Date   . ABSCESS DRAINAGE  2019   . ANTERIOR CRUCIATE LIGAMENT REPAIR     . CT CHEST ANGIO W AND WO IV CONTRAST  06/03/2019    CT CHEST ANGIO W AND WO IV CONTRAST CONVERSION CERNER   . OTHER SURGICAL HISTORY  02/01/2005    Cholecystocecostomy   . OTHER SURGICAL HISTORY  02/01/2017    Incision and drainage of abscess of neck   . OTHER SURGICAL HISTORY  12/25/2018    MCL - Repair of medial collateral ligament   . US  GUIDED SOFT TISSUE  BIOPSY  06/25/2021    US  GUIDED SOFT TISSUE BIOPSY 06/25/2021 [facility] US  PROC [facility]       Family History   Problem Relation Name Age of Onset   . No Known Problems Mother     .  Rheum arthritis Father Latisa Belay    . Arthritis Father Emmarae Cowdery    . COPD Father Gerrianne Aydelott    . Autoimmune disease Father Darbie Biancardi    . ADD / ADHD Father Aubriegh Minch    . No Known Problems Sister     . No Known Problems Brother     . Lupus Mother's Sister Serra    . No Known Problems Mother's Brother     . Psoriasis Father's Sister Biomedical scientist    . Multiple sclerosis Father's Brother Wanna    . No Known Problems Maternal Grandmother     . Frontotemporal dementia Maternal Grandfather Zachary Shams    . Neuropathy Maternal Grandfather Zachary Shams    . Fibromyalgia Paternal Grandmother Almarie pinal    . Heart disease Paternal Grandmother Almarie pinal    . Heart failure Paternal Grandmother Almarie pinal    . Cancer Paternal Grandfather Opa    . No Known Problems Other     . ADD / ADHD Brother Garnette A. Pinal        Social History     Socioeconomic History   . Marital status: Married   Occupational History   . Occupation: airline pilot   Tobacco Use   . Smoking status: Some Days     Current packs/day: 1.00     Average packs/day: 1 pack/day for 15.0 years (15.0 ttl pk-yrs)     Types: Cigarettes     Passive exposure: Current   . Smokeless tobacco: Never   . Tobacco comments:     one pack weekly   Vaping Use   . Vaping status: Never Used   Substance and Sexual Activity   . Alcohol use: Yes     Comment: couple drinks monthly   . Drug use: Yes     Types: Marijuana     Comment: medical; daily   . Sexual activity: Yes     Partners: Female     Birth control/protection: None   Social History Narrative    Lives with spouse and children        Review of Systems     Review of Systems   Constitutional:  Positive for fever.   HENT:  Positive for sore throat. Negative for congestion.    Eyes:  Positive for visual  disturbance.   Respiratory:  Negative for cough.    Gastrointestinal:  Positive for nausea and vomiting. Negative for abdominal pain, blood in stool, constipation and diarrhea.        She has chronic vomiting secondary to gastroparesis, not worse than baseline.  No blood in emesis.   Genitourinary:  Negative for dysuria and hematuria.   Neurological:  Negative for light-headedness.   All other systems reviewed and are negative.       Physical Exam     ED Triage Vitals [10/27/22 1926]   Temp Heart Rate Resp BP   36.7 C (98.1 F) 94 (!) 24 (!) 141/92      SpO2 Temp Source Heart Rate Source Patient Position   98 % Oral -- --      BP Location FiO2 (%)     -- --         Physical Exam  Vitals reviewed.   Constitutional:       General: She is not in acute distress.     Appearance: She is well-developed. She is not diaphoretic.   HENT:      Head: Normocephalic and atraumatic.  Mouth/Throat:      Mouth: Mucous membranes are moist.   Eyes:      Extraocular Movements: Extraocular movements intact.      Conjunctiva/sclera: Conjunctivae normal.      Pupils: Pupils are equal, round, and reactive to light.   Cardiovascular:      Rate and Rhythm: Normal rate and regular rhythm.      Heart sounds: No murmur heard.     No friction rub. No gallop.   Pulmonary:      Effort: Pulmonary effort is normal. No respiratory distress.      Breath sounds: Normal breath sounds. No wheezing, rhonchi or rales.   Abdominal:      Palpations: Abdomen is soft.      Tenderness: There is no abdominal tenderness.   Musculoskeletal:         General: No swelling.      Cervical back: Neck supple.   Skin:     General: Skin is warm and dry.      Capillary Refill: Capillary refill takes less than 2 seconds.      Comments: Erythematous plaque to anterior neck.  No surrounding erythema or swelling. Nontender to palpation.    Hair loss on right side of head compared to the left.  Multiple scabs on back of neck and right side of head.  No purulent, serous, or  sanguinous drainage.   Neurological:      General: No focal deficit present.      Mental Status: She is alert.   Psychiatric:         Mood and Affect: Mood normal.                    Please see nursing notes for additional information regarding past medical, family, and social history as well as medication and vital signs. Please see nursing documentation for medications given.         Marolyn Murdoch, MD  Resident  10/27/22 [phone]

## 2022-10-27 NOTE — Telephone Encounter (Signed)
 Patient stated that you mentioned to her next time this happens, let me know so I can get you in within a day or two. Patient requesting a call back from you asap

## 2022-10-28 NOTE — Telephone Encounter (Signed)
 Urgent message already sent to Dr. Fay regarding this.    Joesph, RN

## 2022-10-28 NOTE — ED Triage Notes (Addendum)
 Pt with chest pain and shortness of breath increasing over last few hours, has a rash spreading from neck onto chest. Pt also experiencing dizziness and weakness.  Pt having difficulty swallowing also. Pt here for same yesterday. Pt brought in by her friend who states they left yesterday d/t her anxiety not being treated appropriately .

## 2022-10-28 NOTE — Telephone Encounter (Signed)
 Patient wanted to let you know she's going back into the [facility] ER

## 2022-10-28 NOTE — ED Provider Notes (Signed)
 Final diagnoses:   [R21] Rash   [M79.10] Myalgia       HPI    Chief Complaint   Patient presents with   . Chest Pain   . Shortness of Breath   . Rash       62F with history of ADHD, PCOS, gastroparesis presenting to ED for progressively worsening dysphagia, anterior neck and lateral right head rash, myalgias, chest pain and SOB. She was seen in the ED last night for similar rash concerns and pain and discharged with p.o analgesics, muscle relaxant, steroid cream, follow-up, and return precautions. Reports that she has been unable to take p.o medications or liquids without coughing. Now has shortness of breath that has gotten worse throughout the afternoon. Describes anterior chest pain, non radiating, reproducible over area of rash. Has not been ambulating since discharge from ED last night, in wheelchair. Reports that prior to 2 days ago, was ambulating, working, eating and drinking okay.      No recent travel, sickness, or changes in medications. Has previously been seen by neurology last month for L sided weakness, muscle spasms, foot drop, dysphagia, memory problems and confusion, and neck pain with negative workup for MS. Reporting constellation of symptoms began in 2019.      Has had previous negative rheumatology workup. Is also being followed by Infectious disease for chronic rash where previous biopsy revealed ulceration and superficial neutrophilic inflammation but workup negative for vascular causes                      ED Course & MDM    ED Course as of 10/30/22 1453   Thu Oct 28, 2022   2025 Patient would like to leave at this time.  Patient offered fluids, hydration, rest. Patient did not want to stay for this. After droperidol and benadryl , patient had improved mobility of her neck and extremities. There was no longer limitations to range of motion secondary to her pain. She did not subjectively feel that pain was better managed, but objectively she had significant improvement in pain  control.  [DG]   Sat Oct 30, 2022   1451 Troponin I: <0.03 [DG]      ED Course User Index  [DG] Edsel Sours, MD         Diagnosis as of 10/30/22 1453   Rash   Myalgia       Medical Decision Making  62F presenting with worsening dysphagia, rash, muscle weakness, and shortness of breath. EKG and cardiac markers unremarkable, labs between today and yesterday's ED visits not concerning for infectious or inflammatory causes, vitals stable. Patient offered IV fluids, benadryl , and droperidol, and observation until dermatology can assess her rashes in the morning. However, patient requests to leave at this time. Some improvement in neck range of motion and mobility noticed on reevaluation but still reports no change in pain levels. Discharge with topical steroid, antibiotic, follow-up with PCP and dermatology, and return precautions.     Risk  Prescription drug management.                                                SAFE-T Assessment             Patient History        Diagnosis Date   . ADHD (attention deficit hyperactivity disorder)    .  Bacterial pneumonia     Oct 2022   . Bell palsy 2011   . Difficulty walking    . Gastroparesis 2023   . Headache    . Headache, tension-type    . Insomnia    . Low back pain    . Memory loss    . Myelomalacia (CMS/HCC) 2023   . Numbness    . Parotid abscess    . PCOS (polycystic ovarian syndrome)    . Peripheral neuropathy    . RSV infection     Oct 2022   . Syncope    . Weakness of limb        Past Surgical History:   Procedure Laterality Date   . ABSCESS DRAINAGE  2019   . ANTERIOR CRUCIATE LIGAMENT REPAIR     . CT CHEST ANGIO W AND WO IV CONTRAST  06/03/2019    CT CHEST ANGIO W AND WO IV CONTRAST CONVERSION CERNER   . OTHER SURGICAL HISTORY  02/01/2005    Cholecystocecostomy   . OTHER SURGICAL HISTORY  02/01/2017    Incision and drainage of abscess of neck   . OTHER SURGICAL HISTORY  12/25/2018    MCL - Repair of medial collateral ligament   . US  GUIDED SOFT TISSUE BIOPSY   06/25/2021    US  GUIDED SOFT TISSUE BIOPSY 06/25/2021 [facility] MAIN US  PROC NVIR       Family History   Problem Relation Name Age of Onset   . No Known Problems Mother     . Rheum arthritis Father Layne Lebon    . Arthritis Father Contrina Orona    . COPD Father Julissa Browning    . Autoimmune disease Father Ulani Degrasse    . ADD / ADHD Father Caryn Gienger    . No Known Problems Sister     . No Known Problems Brother     . Lupus Mother's Sister Lorenda    . No Known Problems Mother's Brother     . Psoriasis Father's Sister Biomedical scientist    . Multiple sclerosis Father's Brother Wanna    . No Known Problems Maternal Grandmother     . Frontotemporal dementia Maternal Grandfather Zachary Shams    . Neuropathy Maternal Grandfather Zachary Shams    . Fibromyalgia Paternal Grandmother Almarie pinal    . Heart disease Paternal Grandmother Almarie pinal    . Heart failure Paternal Grandmother Almarie pinal    . Cancer Paternal Grandfather Opa    . No Known Problems Other     . ADD / ADHD Brother Garnette A. Pinal        Social History     Socioeconomic History   . Marital status: Married   Occupational History   . Occupation: airline pilot   Tobacco Use   . Smoking status: Some Days     Current packs/day: 1.00     Average packs/day: 1 pack/day for 15.0 years (15.0 ttl pk-yrs)     Types: Cigarettes     Passive exposure: Current   . Smokeless tobacco: Never   . Tobacco comments:     one pack weekly   Vaping Use   . Vaping status: Never Used   Substance and Sexual Activity   . Alcohol use: Yes     Comment: couple drinks monthly   . Drug use: Yes     Types: Marijuana     Comment: medical; daily   . Sexual activity: Yes     Partners: Female  Birth control/protection: None   Social History Narrative    Lives with spouse and children        Review of Systems    Review of Systems   HENT:  Positive for trouble swallowing. Negative for sore throat.    Cardiovascular:  Positive for chest pain.   Gastrointestinal:  Positive for  abdominal pain.   Musculoskeletal:  Positive for myalgias.   Psychiatric/Behavioral:  Positive for agitation and dysphoric mood. The patient is nervous/anxious.         Physical Exam    ED Triage Vitals [10/28/22 1600]   Temp Heart Rate Resp BP   36.8 C (98.2 F) 92 18 (!) 131/91      SpO2 Temp Source Heart Rate Source Patient Position   98 % Oral Monitor Sitting      BP Location FiO2 (%)     Right arm --         Physical Exam       HENT:      Head:      Comments: Erythematous, dry and flaky rash on large surface of right scalp.  Erythematous, well circumscribed, raised rash on anterior neck. Tender to palpation      Eyes:      Extraocular Movements: Extraocular movements intact.      Pupils: Pupils are equal, round, and reactive to light.   Cardiovascular:      Rate and Rhythm: Normal rate and regular rhythm.      Heart sounds: Normal heart sounds. No murmur heard.  Pulmonary:      Effort: Pulmonary effort is normal. No respiratory distress.      Breath sounds: No wheezing or rales.   Skin:     Findings: Rash present.         Please see nursing notes for additional information regarding past medical, family, and social history as well as medication and vital signs. Please see nursing documentation for medications given.         Edsel Sours, MD  10/30/22 [phone]

## 2022-11-01 ENCOUNTER — Emergency Department: Admit: 2022-11-01 | Payer: PRIVATE HEALTH INSURANCE | Primary: Primary Care

## 2022-11-01 ENCOUNTER — Inpatient Hospital Stay
Admit: 2022-11-01 | Discharge: 2022-11-06 | Disposition: A | Discharge status: Home / Self Care | Payer: PRIVATE HEALTH INSURANCE | Attending: Internal Medicine | Admitting: Internal Medicine

## 2022-11-01 ENCOUNTER — Inpatient Hospital Stay: Payer: PRIVATE HEALTH INSURANCE | Primary: Primary Care

## 2022-11-01 ENCOUNTER — Inpatient Hospital Stay
Admit: 2022-11-01 | Discharge: 2022-11-08 | Payer: PRIVATE HEALTH INSURANCE | Attending: Critical Care Medicine | Primary: Primary Care

## 2022-11-01 ENCOUNTER — Inpatient Hospital Stay
Admit: 2022-11-01 | Discharge: 2022-11-01 | Disposition: A | Discharge status: Other Acute Inpatient Hospital | Payer: PRIVATE HEALTH INSURANCE | Attending: Emergency Medicine

## 2022-11-01 DIAGNOSIS — I609 Nontraumatic subarachnoid hemorrhage, unspecified: Secondary | ICD-10-CM

## 2022-11-01 DIAGNOSIS — I604 Nontraumatic subarachnoid hemorrhage from basilar artery: Principal | ICD-10-CM

## 2022-11-01 LAB — CBC WITH AUTO DIFFERENTIAL
Basophils %: 0 % (ref 0–1)
Basophils Absolute: 0.1 10*3/uL (ref 0.0–0.1)
Eosinophils %: 4 % (ref 0–7)
Eosinophils Absolute: 0.5 10*3/uL — ABNORMAL HIGH (ref 0.0–0.4)
Hematocrit: 43.9 % (ref 35.0–47.0)
Hemoglobin: 15.3 g/dL (ref 11.5–16.0)
Immature Granulocytes %: 0 % (ref 0.0–0.5)
Immature Granulocytes Absolute: 0 10*3/uL (ref 0.00–0.04)
Lymphocytes %: 27 % (ref 12–49)
Lymphocytes Absolute: 3.3 10*3/uL (ref 0.8–3.5)
MCH: 31.9 pg (ref 26.0–34.0)
MCHC: 34.9 g/dL (ref 30.0–36.5)
MCV: 91.6 FL (ref 80.0–99.0)
MPV: 10.4 FL (ref 8.9–12.9)
Monocytes %: 6 % (ref 5–13)
Monocytes Absolute: 0.7 10*3/uL (ref 0.0–1.0)
Neutrophils %: 63 % (ref 32–75)
Neutrophils Absolute: 7.7 10*3/uL (ref 1.8–8.0)
Nucleated RBCs: 0 /100{WBCs}
Platelets: 345 10*3/uL (ref 150–400)
RBC: 4.79 M/uL (ref 3.80–5.20)
RDW: 11.6 % (ref 11.5–14.5)
WBC: 12.1 10*3/uL — ABNORMAL HIGH (ref 3.6–11.0)
nRBC: 0 10*3/uL (ref 0.00–0.01)

## 2022-11-01 LAB — COMPREHENSIVE METABOLIC PANEL
ALT: 17 U/L (ref 12–78)
AST: 16 U/L (ref 15–37)
Albumin/Globulin Ratio: 1.3 (ref 1.1–2.2)
Albumin: 4 g/dL (ref 3.5–5.0)
Alk Phosphatase: 70 U/L (ref 45–117)
Anion Gap: 5 mmol/L (ref 2–12)
BUN/Creatinine Ratio: 11 — ABNORMAL LOW (ref 12–20)
BUN: 9 mg/dL (ref 6–20)
CO2: 25 mmol/L (ref 21–32)
Calcium: 9.6 mg/dL (ref 8.5–10.1)
Chloride: 108 mmol/L (ref 97–108)
Creatinine: 0.82 mg/dL (ref 0.55–1.02)
Est, Glom Filt Rate: >90 mL/min/{1.73_m2} (ref >60)
Globulin: 3.2 g/dL (ref 2.0–4.0)
Glucose: 113 mg/dL — ABNORMAL HIGH (ref 65–100)
Potassium: 4.1 mmol/L (ref 3.5–5.1)
Sodium: 138 mmol/L (ref 136–145)
Total Bilirubin: 0.3 mg/dL (ref 0.2–1.0)
Total Protein: 7.2 g/dL (ref 6.4–8.2)

## 2022-11-01 LAB — HCG, SERUM, QUALITATIVE: Preg, Serum: NEGATIVE

## 2022-11-01 LAB — PROTIME-INR
INR: 0.9 (ref 0.9–1.1)
Protime: 9.8 s (ref 9.0–11.1)

## 2022-11-01 LAB — POCT GLUCOSE: POC Glucose: 111 mg/dL (ref 65–117)

## 2022-11-01 LAB — TROPONIN: Troponin, High Sensitivity: <4 ng/L (ref 0–51)

## 2022-11-01 MED ORDER — HEPARIN 4,000 UNITS IN NS 1000 ML (ANGIO) INFUSION
4000 | INTRAVENOUS | Status: AC | PRN
Start: 2022-11-01 — End: 2022-11-01

## 2022-11-01 MED ORDER — SODIUM CHLORIDE 0.9 % IV SOLN
0.9 | INTRAVENOUS | Status: DC | PRN
Start: 2022-11-01 — End: 2022-11-06
  Administered 2022-11-01: 21:00:00 via INTRAVENOUS

## 2022-11-01 MED ORDER — NORMAL SALINE FLUSH 0.9 % IV SOLN
0.9 | INTRAVENOUS | Status: DC | PRN
Start: 2022-11-01 — End: 2022-11-06

## 2022-11-01 MED ORDER — ONDANSETRON HCL 4 MG/2ML IJ SOLN
4 | Freq: Once | INTRAMUSCULAR | Status: DC
Start: 2022-11-01 — End: 2022-11-01

## 2022-11-01 MED ORDER — SODIUM CHLORIDE 0.9 % IV SOLN
0.9 | INTRAVENOUS | Status: DC | PRN
Start: 2022-11-01 — End: 2022-11-06

## 2022-11-01 MED ORDER — SODIUM CHLORIDE 0.9 % IV BOLUS
0.9 | Freq: Once | INTRAVENOUS | Status: DC
Start: 2022-11-01 — End: 2022-11-01

## 2022-11-01 MED ORDER — MIDAZOLAM HCL 5 MG/5ML IJ SOLN
5 | INTRAMUSCULAR | Status: AC
Start: 2022-11-01 — End: ?

## 2022-11-01 MED ORDER — IOPAMIDOL 61 % IV SOLN
61 | INTRAVENOUS | Status: AC
Start: 2022-11-01 — End: ?

## 2022-11-01 MED ORDER — VERAPAMIL HCL 2.5 MG/ML IV SOLN
2.5 | INTRAVENOUS | Status: AC
Start: 2022-11-01 — End: ?

## 2022-11-01 MED ORDER — NICARDIPINE HCL 2.5 MG/ML IV SOLN
2.5 | INTRAVENOUS | Status: DC
Start: 2022-11-01 — End: 2022-11-01

## 2022-11-01 MED ORDER — ROCURONIUM BROMIDE 50 MG/5ML IV SOLN
50 | INTRAVENOUS | Status: AC
Start: 2022-11-01 — End: ?

## 2022-11-01 MED ORDER — PANTOPRAZOLE SODIUM 20 MG PO TBEC
20 | Freq: Every day | ORAL | Status: DC
Start: 2022-11-01 — End: 2022-11-06
  Administered 2022-11-01 – 2022-11-05 (×5): 20 mg via ORAL

## 2022-11-01 MED ORDER — MIDAZOLAM HCL 5 MG/5ML IJ SOLN
5 | Freq: Once | INTRAMUSCULAR | Status: DC | PRN
Start: 2022-11-01 — End: 2022-11-01

## 2022-11-01 MED ORDER — HYDROMORPHONE HCL PF 1 MG/ML IJ SOLN
1 | INTRAMUSCULAR | Status: AC
Start: 2022-11-01 — End: 2022-11-01

## 2022-11-01 MED ORDER — FENTANYL CITRATE (PF) 100 MCG/2ML IJ SOLN
100 | Freq: Once | INTRAMUSCULAR | Status: DC | PRN
Start: 2022-11-01 — End: 2022-11-01

## 2022-11-01 MED ORDER — NITROGLYCERIN 2 % TD OINT
2 | TRANSDERMAL | Status: AC
Start: 2022-11-01 — End: ?

## 2022-11-01 MED ORDER — FENTANYL CITRATE (PF) 100 MCG/2ML IJ SOLN
100 | INTRAMUSCULAR | Status: DC
Start: 2022-11-01 — End: 2022-11-01

## 2022-11-01 MED ORDER — FENTANYL CITRATE (PF) 100 MCG/2ML IJ SOLN
100 | INTRAMUSCULAR | Status: AC
Start: 2022-11-01 — End: 2022-11-01

## 2022-11-01 MED ORDER — ONDANSETRON 4 MG PO TBDP
4 | Freq: Three times a day (TID) | ORAL | Status: DC | PRN
Start: 2022-11-01 — End: 2022-11-06

## 2022-11-01 MED ORDER — NORMAL SALINE FLUSH 0.9 % IV SOLN
0.9 | Freq: Two times a day (BID) | INTRAVENOUS | Status: DC
Start: 2022-11-01 — End: 2022-11-06
  Administered 2022-11-02 – 2022-11-05 (×5): 10 mL via INTRAVENOUS

## 2022-11-01 MED ORDER — NITROGLYCERIN 2 % TD OINT
2 | TRANSDERMAL | Status: AC | PRN
Start: 2022-11-01 — End: 2022-11-01

## 2022-11-01 MED ORDER — SODIUM CHLORIDE 0.9 % IV SOLN
0.9 | INTRAVENOUS | Status: DC
Start: 2022-11-01 — End: 2022-11-04
  Administered 2022-11-01 – 2022-11-03 (×3): via INTRAVENOUS

## 2022-11-01 MED ORDER — ONDANSETRON HCL 4 MG/2ML IJ SOLN
4 | Freq: Four times a day (QID) | INTRAMUSCULAR | Status: DC | PRN
Start: 2022-11-01 — End: 2022-11-06
  Administered 2022-11-02 – 2022-11-03 (×3): 4 mg via INTRAVENOUS

## 2022-11-01 MED ORDER — SODIUM CHLORIDE 0.9 % IV SOLN
0.9 | INTRAVENOUS | Status: DC
Start: 2022-11-01 — End: 2022-11-01

## 2022-11-01 MED ORDER — PHENYLEPHRINE HCL (PRESSORS) 10 MG/ML IV SOLN
10 | INTRAVENOUS | Status: AC
Start: 2022-11-01 — End: ?

## 2022-11-01 MED ORDER — LIDOCAINE HCL (PF) 2 % IJ SOLN
2 | INTRAMUSCULAR | Status: AC
Start: 2022-11-01 — End: ?

## 2022-11-01 MED ORDER — FENTANYL CITRATE (PF) 100 MCG/2ML IJ SOLN
100 | INTRAMUSCULAR | Status: AC
Start: 2022-11-01 — End: ?

## 2022-11-01 MED ORDER — NIMODIPINE 30 MG PO CAPS
30 | ORAL | Status: DC
Start: 2022-11-01 — End: 2022-11-06
  Administered 2022-11-01 – 2022-11-06 (×26): 60 mg via ORAL

## 2022-11-01 MED ORDER — PROPOFOL 200 MG/20ML IV EMUL
200 | INTRAVENOUS | Status: AC
Start: 2022-11-01 — End: ?

## 2022-11-01 MED ORDER — HEPARIN SODIUM (PORCINE) 1000 UNIT/ML IJ SOLN
1000 | INTRAMUSCULAR | Status: AC
Start: 2022-11-01 — End: ?

## 2022-11-01 MED ORDER — HEPARIN 4,000 UNITS IN NS 1000 ML (ANGIO) INFUSION
4000 | INTRAVENOUS | Status: DC | PRN
Start: 2022-11-01 — End: 2022-11-02

## 2022-11-01 MED ORDER — LIDOCAINE 4 % EX CREA
4 | CUTANEOUS | Status: AC
Start: 2022-11-01 — End: ?

## 2022-11-01 MED ORDER — NITROGLYCERIN 1000 MCG / 10 ML SYRINGE
100 | Status: AC | PRN
Start: 2022-11-01 — End: 2022-11-01

## 2022-11-01 MED ORDER — LIDOCAINE 4 % EX CREA
4 | CUTANEOUS | Status: AC | PRN
Start: 2022-11-01 — End: 2022-11-01

## 2022-11-01 MED ORDER — LIDOCAINE HCL 1 % IJ SOLN
1 | INTRAMUSCULAR | Status: AC
Start: 2022-11-01 — End: ?

## 2022-11-01 MED ORDER — NICARDIPINE HCL 2.5 MG/ML IV SOLN
2.5 | INTRAVENOUS | Status: AC
Start: 2022-11-01 — End: ?

## 2022-11-01 MED ORDER — ATORVASTATIN CALCIUM 40 MG PO TABS
40 | Freq: Every day | ORAL | Status: DC
Start: 2022-11-01 — End: 2022-11-06
  Administered 2022-11-02 – 2022-11-05 (×5): 20 mg via ORAL

## 2022-11-01 MED ORDER — IOPAMIDOL 76 % IV SOLN
76 | Freq: Once | INTRAVENOUS | Status: AC | PRN
Start: 2022-11-01 — End: 2022-11-01

## 2022-11-01 MED ORDER — VERAPAMIL HCL 2.5 MG/ML IV SOLN
2.5 | INTRAVENOUS | Status: AC | PRN
Start: 2022-11-01 — End: 2022-11-01

## 2022-11-01 MED ORDER — NORMAL SALINE FLUSH 0.9 % IV SOLN
0.9 | Freq: Two times a day (BID) | INTRAVENOUS | Status: DC
Start: 2022-11-01 — End: 2022-11-06
  Administered 2022-11-02: 10 mL via INTRAVENOUS
  Administered 2022-11-02: 13:00:00 5 mL via INTRAVENOUS
  Administered 2022-11-03 – 2022-11-05 (×6): 10 mL via INTRAVENOUS

## 2022-11-01 MED ORDER — IOPAMIDOL 61 % IV SOLN
61 | INTRAVENOUS | Status: AC | PRN
Start: 2022-11-01 — End: 2022-11-01

## 2022-11-01 MED ORDER — NICARDIPINE HCL 2.5 MG/ML IV SOLN
2.5 | INTRAVENOUS | Status: DC
Start: 2022-11-01 — End: 2022-11-03

## 2022-11-01 MED ORDER — NITROGLYCERIN IN D5W 100-5 MCG/ML-% IV SOLN
100 | INTRAVENOUS | Status: AC
Start: 2022-11-01 — End: ?

## 2022-11-01 MED ORDER — LIDOCAINE HCL (PF) 1 % IJ SOLN
1 | INTRAMUSCULAR | Status: AC | PRN
Start: 2022-11-01 — End: 2022-11-01

## 2022-11-01 MED ORDER — MAGNESIUM SULFATE IN D5W 1-5 GM/100ML-% IV SOLN
1-5 | INTRAVENOUS | Status: DC | PRN
Start: 2022-11-01 — End: 2022-11-06

## 2022-11-01 MED ORDER — ONDANSETRON HCL 4 MG/2ML IJ SOLN
4 | Freq: Once | INTRAMUSCULAR | Status: DC | PRN
Start: 2022-11-01 — End: 2022-11-01

## 2022-11-01 MED ORDER — HEPARIN (PORCINE) IN NACL 2000-0.9 UNIT/L-% IV SOLN
2000-0.9 | INTRAVENOUS | Status: AC
Start: 2022-11-01 — End: ?

## 2022-11-01 MED ORDER — ACETAMINOPHEN 325 MG PO TABS
325 | ORAL | Status: DC | PRN
Start: 2022-11-01 — End: 2022-11-02

## 2022-11-01 MED ADMIN — niCARdipine (CARDENE) 25 mg in sodium chloride 0.9 % 250 mL infusion (Vial2Bag): 5 mg/h | INTRAVENOUS | @ 18:00:00 | NDC 09010824005

## 2022-11-01 MED ADMIN — heparin 4000 units/1000 mL (4 units/mL) in NS infusion **FOR ANGIO USE ONLY**: 100 | @ 22:00:00 | NDC 09999991826

## 2022-11-01 MED ADMIN — iopamidol (ISOVUE-370) 76 % injection 100 mL: 100 mL | INTRAVENOUS | @ 18:00:00 | NDC 00270131635

## 2022-11-01 MED ADMIN — acetaminophen (TYLENOL) tablet 650 mg: 650 mg | ORAL | @ 23:00:00 | NDC 00904677361

## 2022-11-01 MED ADMIN — fentaNYL (SUBLIMAZE) injection 50 mcg: 50 ug | INTRAVENOUS | @ 18:00:00 | NDC 00641602701

## 2022-11-01 MED ADMIN — fentaNYL (SUBLIMAZE) injection: 25 | INTRAVENOUS | @ 21:00:00 | NDC 00409909332

## 2022-11-01 MED ADMIN — midazolam (VERSED) injection: 2 | INTRAVENOUS | @ 21:00:00 | NDC 00641605901

## 2022-11-01 MED ADMIN — midazolam (VERSED) injection: 1 | INTRAVENOUS | @ 21:00:00 | NDC 00641605901

## 2022-11-01 MED ADMIN — lidocaine (LMX) 4 % cream: 1 | TOPICAL | @ 21:00:00 | NDC 47781057047

## 2022-11-01 MED ADMIN — iopamidol (ISOVUE-300) 61 % injection: 72 | INTRAVENOUS | @ 22:00:00 | NDC 00270131535

## 2022-11-01 MED ADMIN — ondansetron (ZOFRAN) injection: 4 | INTRAVENOUS | @ 22:00:00 | NDC 60505613000

## 2022-11-01 MED ADMIN — HYDROmorphone HCl PF (DILAUDID) injection 0.5 mg: 0.5 mg | INTRAVENOUS | @ 18:00:00 | NDC 76045000901

## 2022-11-01 MED ADMIN — niCARdipine (CARDENE) 25 mg in sodium chloride 0.9 % 250 mL infusion (Vial2Bag): 7.5 mg/h | INTRAVENOUS | @ 23:00:00 | NDC 50022088801

## 2022-11-01 MED ADMIN — niCARdipine (CARDENE) 25 mg in sodium chloride 0.9 % 250 mL infusion (Vial2Bag): 5 mg/h | INTRAVENOUS | @ 21:00:00 | NDC 09036098150

## 2022-11-01 MED ADMIN — fentaNYL (SUBLIMAZE) injection: 50 | INTRAVENOUS | @ 22:00:00 | NDC 00409909332

## 2022-11-01 MED ADMIN — verapamil (ISOPTIN) injection: 2.5 | INTRAMUSCULAR | @ 21:00:00 | NDC 70069027101

## 2022-11-01 MED ADMIN — lidocaine PF 1 % injection: 1 | INTRADERMAL | @ 21:00:00 | NDC 55150016205

## 2022-11-01 MED ADMIN — nitroglycerin (100 mcg/mL) 100 mcg/mL injection: 200 | INTRA_ARTERIAL | @ 21:00:00 | NDC 09999991166

## 2022-11-01 MED ADMIN — nitroglycerin (NITRO-BID) 2 % ointment: 1 | TOPICAL | @ 21:00:00 | NDC 00281032608

## 2022-11-01 MED FILL — SODIUM CHLORIDE 0.9 % IV SOLN: 0.9 % | INTRAVENOUS | Qty: 250

## 2022-11-01 MED FILL — DIPRIVAN 200 MG/20ML IV EMUL: 200 MG/20ML | INTRAVENOUS | Qty: 20

## 2022-11-01 MED FILL — VERAPAMIL HCL 2.5 MG/ML IV SOLN: 2.5 MG/ML | INTRAVENOUS | Qty: 4

## 2022-11-01 MED FILL — MIDAZOLAM HCL 5 MG/5ML IJ SOLN: 5 MG/ML | INTRAMUSCULAR | Qty: 5

## 2022-11-01 MED FILL — HYDROMORPHONE HCL 1 MG/ML IJ SOLN: 1 MG/ML | INTRAMUSCULAR | Qty: 1

## 2022-11-01 MED FILL — ROCURONIUM BROMIDE 50 MG/5ML IV SOLN: 50 MG/5ML | INTRAVENOUS | Qty: 5

## 2022-11-01 MED FILL — HEPARIN (PORCINE) IN NACL 2000-0.9 UNIT/L-% IV SOLN: INTRAVENOUS | Qty: 4000

## 2022-11-01 MED FILL — NIMODIPINE 30 MG PO CAPS: 30 MG | ORAL | Qty: 2

## 2022-11-01 MED FILL — HEPARIN 4,000 UNITS IN NS 1000 ML (ANGIO) INFUSION: 4000 units/1000 mL | INTRAVENOUS | Qty: 1000

## 2022-11-01 MED FILL — FENTANYL CITRATE (PF) 100 MCG/2ML IJ SOLN: 100 MCG/2ML | INTRAMUSCULAR | Qty: 2

## 2022-11-01 MED FILL — NITRO-BID 2 % TD OINT: 2 % | TRANSDERMAL | Qty: 1

## 2022-11-01 MED FILL — VERAPAMIL HCL 2.5 MG/ML IV SOLN: 2.5 MG/ML | INTRAVENOUS | Qty: 2

## 2022-11-01 MED FILL — LIDOCAINE 4 % EX CREA: 4 % | CUTANEOUS | Qty: 5

## 2022-11-01 MED FILL — NORMAL SALINE FLUSH 0.9 % IV SOLN: 0.9 % | INTRAVENOUS | Qty: 40

## 2022-11-01 MED FILL — PANTOPRAZOLE SODIUM 20 MG PO TBEC: 20 MG | ORAL | Qty: 1

## 2022-11-01 MED FILL — ISOVUE-300 61 % IV SOLN: 61 % | INTRAVENOUS | Qty: 300

## 2022-11-01 MED FILL — PHENYLEPHRINE HCL (PRESSORS) 10 MG/ML IV SOLN: 10 MG/ML | INTRAVENOUS | Qty: 5

## 2022-11-01 MED FILL — XYLOCAINE-MPF 2 % IJ SOLN: 2 % | INTRAMUSCULAR | Qty: 5

## 2022-11-01 MED FILL — ACETAMINOPHEN 325 MG PO TABS: 325 MG | ORAL | Qty: 2

## 2022-11-01 MED FILL — NICARDIPINE HCL 2.5 MG/ML IV SOLN: 2.5 MG/ML | INTRAVENOUS | Qty: 10

## 2022-11-01 MED FILL — NITROGLYCERIN IN D5W 100-5 MCG/ML-% IV SOLN: 100 mcg/mL | INTRAVENOUS | Qty: 10

## 2022-11-01 MED FILL — ISOVUE-370 76 % IV SOLN: 76 % | INTRAVENOUS | Qty: 100

## 2022-11-01 MED FILL — LIDOCAINE HCL 1 % IJ SOLN: 1 % | INTRAMUSCULAR | Qty: 20

## 2022-11-01 MED FILL — CARDENE IV 2.5 MG/ML IV SOLN: 2.5 MG/ML | INTRAVENOUS | Qty: 10

## 2022-11-01 MED FILL — ISOVUE-300 61 % IV SOLN: 61 % | INTRAVENOUS | Qty: 100

## 2022-11-01 MED FILL — HEPARIN SODIUM (PORCINE) 1000 UNIT/ML IJ SOLN: 1000 UNIT/ML | INTRAMUSCULAR | Qty: 10

## 2022-11-01 MED FILL — SODIUM CHLORIDE 0.9 % IV SOLN: 0.9 % | INTRAVENOUS | Qty: 1000

## 2022-11-01 NOTE — Progress Notes (Signed)
 Neurocritical Care Brief Progress Note:  42 y.o F with significant pmh for ADHD, PCOS, Peripheral neuropathy, Pick's disease per patient, and tobacco use transferred from [facility] to [facility] on 11/01/22 with acute SAH. CTA h-n was negative for a vascular source. Taken for dsa by Dr. Massie which is negative for vascular source with plan to follow up imaging in 7 days. Right groin/radial arteriotomy site is C/D/I without oozing, hematoma, tenderness, redness, bruising. + distal pulses.     Pt seen with friend at the bedside. Pt continues to c/o refractory headaches. Reported tylenol  not effective. PRN Fioricet  and Reglan  ordered without improvement. Mag sulfate IV x1 dose ordered. Will reassess pain later.   Review of Systems:   Admits headache, chronic left-sided weakness with decrease sensation to LLE which pt relates to post surgery to the LLE. Denied any other issues, no visual issues, no ear/nose, throat problems; no coughing or wheezing or shortness of breath, No chest pain or orthopnea, no abdominal pain, nausea or vomiting, No pain in the body or extremities, no psychiatric, neurological, endocrine, hematological or cardiac complaints except as noted above.      Physical Exam:   Blood pressure 123/76, pulse 62, temperature 98.3 F (36.8 C), temperature source Oral, resp. rate 16, SpO2 91%. Net IO Since Admission: 662.66 mL [11/01/22 2116]    GEN: Cooperative, Calm, Well developed and nourished patient in NAD  HEENT: Normocephalic. Non-icter, no congestion, alopecia   Lungs:  Diminished bilaterally ant, non-labored breathing, RA  Cardiac: S1,S2, normal rate and rhythm, with no murmurs. no gallops  Abdomen: hypoactive bowel sounds, no distention, soft, non-tender  Extremities: 2+ Radial pulses, no clubbing, cyanosis, or edema  Skin: no rashes or lesions noted; Skin color appropriate and warm for ethnicity.       NEURO:  Mental status: Oriented to time, situation, place and person. Able to follow commands  appropriately  Cranial Nerves: Attention and fund of knowledge is appropriate/intact. Speech/Language is intact/fluent. Normal recent and remote memory. EOMI, PERRL, 3mm, no nystagmus, no ptosis. No dysarthria or aphasia. Full facial strength, no asymmetry. Hearing intact bilaterally. Tongue protrudes to midline, palate elevates symmetrically. Shrug Shoulders B/L  Motor: Normal bulk and tone, 5/5 strength to right extremities proximally and distally; weakness and drift to left-sided, right foot drop. No involuntary movements.  Coordination: UTA FTN and HTS testing  Reflexes: +2 throughout, down going toes bilaterally   Sensation: Intact to facial and BU extremities to light touch except decreased to LLE.   Gait: Deferred        Continue current plan per NIS.      Case d/w: C. Lack, DNP, intensivist, primary nurse, patient, and friend at the bedside       spent 35 minutes of time involved in chart review, lab review, imaging review       Elyn Bruins, AGAC-NP  Neurocritical Care Nurse Practitioner  [phone]

## 2022-11-01 NOTE — Anesthesia Pre Procedure (Signed)
 Department of Anesthesiology  Preprocedure Note       Name:  Christina Stevens   Age:  42 y.o.  DOB:  1980/02/09                                          MRN:  [id number]         Date:  11/01/2022      Surgeon: * No surgeons listed *    Procedure: * No procedures listed *    Medications prior to admission:   Prior to Admission medications    Medication Sig Start Date End Date Taking? Authorizing Provider   pantoprazole  (PROTONIX ) 20 MG tablet Take 1 tablet by mouth daily    [provider]   acetaminophen  (TYLENOL ) 325 MG tablet Take 2 tablets by mouth every 6 hours as needed 11/24/20   Automatic Reconciliation, Ar   amphetamine-dextroamphetamine (ADDERALL) 20 MG tablet Take 2 tablets by mouth daily.    Automatic Reconciliation, Ar   atorvastatin  (LIPITOR ) 20 MG tablet Take 1 tablet by mouth daily    Automatic Reconciliation, Ar   ipratropium-albuterol (DUONEB) 0.5-2.5 (3) MG/3ML SOLN nebulizer solution Inhale 3 mLs into the lungs every 4 hours as needed  Patient not taking: Reported on 03/17/2022 11/24/20   Automatic Reconciliation, Ar       Current medications:    Current Facility-Administered Medications   Medication Dose Route Frequency Provider Last Rate Last Admin   . heparin  4000 units/1000 mL (4 units/mL) in NS infusion **FOR ANGIO USE ONLY**   IntraVENous PRN Massie Donnice PARAS, MD         No current outpatient medications on file.     Facility-Administered Medications Ordered in Other Encounters   Medication Dose Route Frequency Provider Last Rate Last Admin   . sodium chloride  flush 0.9 % injection 5-40 mL  5-40 mL IntraVENous 2 times per day Effie Sor, APRN - CNP       . sodium chloride  flush 0.9 % injection 5-40 mL  5-40 mL IntraVENous PRN Effie Sor, APRN - CNP       . 0.9 % sodium chloride  infusion   IntraVENous PRN Effie Sor, APRN - CNP       . acetaminophen  (TYLENOL ) tablet 650 mg  650 mg Oral Q4H PRN Effie Sor, APRN - CNP       . ondansetron  (ZOFRAN -ODT) disintegrating tablet 4 mg   4 mg Oral Q8H PRN Effie Sor, APRN - CNP        Or   . ondansetron  (ZOFRAN ) injection 4 mg  4 mg IntraVENous Q6H PRN Effie Sor, APRN - CNP       . magnesium  sulfate 1000 mg in dextrose  5% 100 mL IVPB  1,000 mg IntraVENous PRN Effie Sor, APRN - CNP       . niCARdipine  (CARDENE ) 25 mg in sodium chloride  0.9 % 250 mL infusion (Vial2Bag)  2.5-15 mg/hr IntraVENous Continuous Effie Sor, APRN - CNP       . atorvastatin  (LIPITOR ) tablet 20 mg  20 mg Oral Daily Tanda Lonni SAUNDERS, MD       . pantoprazole  (PROTONIX ) tablet 20 mg  20 mg Oral Daily Tanda Lonni SAUNDERS, MD       . niMODipine  (NIMOTOP ) capsule 60 mg  60 mg Oral 6 times per day Binford, Jesslyn HERO, APRN - NP       .  0.9 % sodium chloride  infusion   IntraVENous Continuous Binford, Travia M, APRN - NP           Allergies:    Allergies   Allergen Reactions   . Oxycodone -Acetaminophen  Itching       Problem List:    Patient Active Problem List   Diagnosis Code   . Facial cellulitis L03.211   . Community acquired pneumonia J18.9   . Subarachnoid bleed (HCC) I60.9       Past Medical History:        Diagnosis Date   . ADHD    . Elbert Shine infection    . MRSA (methicillin resistant staph aureus) culture positive    . MS (congenital mitral stenosis)    . PCOS (polycystic ovarian syndrome)        Past Surgical History:        Procedure Laterality Date   . APPENDECTOMY     . COLONOSCOPY N/A 03/17/2022    COLONOSCOPY W/ ENDOSCOPIC MUCOSAL RESECTION performed by Danial Bias, MD at [facility] ENDOSCOPY   . IR CHOLECYSTOSTOMY PERCUTANEOUS COMPLETE         Social History:    Social History     Tobacco Use   . Smoking status: Every Day   . Smokeless tobacco: Never   Substance Use Topics   . Alcohol use: No                                Ready to quit: Not Answered  Counseling given: Not Answered      Vital Signs (Current): There were no vitals filed for this visit.                                           BP Readings from Last 3 Encounters:   11/01/22 121/78    03/31/22 122/85   03/17/22 123/88       NPO Status:                                                                                 BMI:   Wt Readings from Last 3 Encounters:   11/01/22 70.3 kg (155 lb)   03/31/22 73.4 kg (161 lb 13.1 oz)   03/17/22 73.5 kg (162 lb)     There is no height or weight on file to calculate BMI.    CBC:   Lab Results   Component Value Date/Time    WBC 12.1 11/01/2022 01:20 PM    RBC 4.79 11/01/2022 01:20 PM    HGB 15.3 11/01/2022 01:20 PM    HCT 43.9 11/01/2022 01:20 PM    MCV 91.6 11/01/2022 01:20 PM    RDW 11.6 11/01/2022 01:20 PM    PLT 345 11/01/2022 01:20 PM       CMP:   Lab Results   Component Value Date/Time    NA 138 11/01/2022 01:20 PM    K 4.1 11/01/2022 01:20 PM    CL 108 11/01/2022 01:20 PM    CO2  25 11/01/2022 01:20 PM    BUN 9 11/01/2022 01:20 PM    CREATININE 0.82 11/01/2022 01:20 PM    GFRAA >60 09/06/2017 06:55 PM    AGRATIO 1.1 03/01/2021 08:56 AM    LABGLOM >90 11/01/2022 01:20 PM    LABGLOM >60 03/31/2022 02:09 PM    LABGLOM >60 03/01/2021 08:56 AM    GLUCOSE 113 11/01/2022 01:20 PM    CALCIUM  9.6 11/01/2022 01:20 PM    BILITOT 0.3 11/01/2022 01:20 PM    ALKPHOS 70 11/01/2022 01:20 PM    ALKPHOS 74 03/01/2021 08:56 AM    AST 16 11/01/2022 01:20 PM    ALT 17 11/01/2022 01:20 PM       POC Tests:   Recent Labs     11/01/22  1306   POCGLU 111       Coags:   Lab Results   Component Value Date/Time    PROTIME 9.8 11/01/2022 01:20 PM    INR 0.9 11/01/2022 01:20 PM       HCG (If Applicable):   Lab Results   Component Value Date    PREGTESTUR Negative 03/31/2022    PREGSERUM Negative 03/31/2022        ABGs:   Lab Results   Component Value Date/Time    PHART 7.42 11/20/2020 03:36 AM    PO2ART 53 11/20/2020 03:36 AM    PCO2ART 38.7 11/20/2020 03:36 AM    HCO3ART 25.3 11/20/2020 03:36 AM    BEART 0.9 11/20/2020 03:36 AM        Type & Screen (If Applicable):  No results found for: ABORH, LABANTI    Drug/Infectious Status (If Applicable):  No results found for: HIV,  HEPCAB    COVID-19 Screening (If Applicable):   Lab Results   Component Value Date/Time    COVID19 Not detected 03/01/2021 09:32 AM    COVID19 Nasopharyngeal 11/17/2020 11:26 AM           Anesthesia Evaluation  Patient summary reviewed and Nursing notes reviewed   no history of anesthetic complications:   Airway: Mallampati: II  TM distance: >3 FB   Neck ROM: full  Mouth opening: > = 3 FB   Dental: normal exam         Pulmonary:normal exam  breath sounds clear to auscultation  (+)           asthma:                            Cardiovascular:  Exercise tolerance: good (>4 METS)  (+) hyperlipidemia        Rhythm: regular  Rate: normal                    Neuro/Psych:   (+) CVA:, psychiatric history:            GI/Hepatic/Renal:   (+) GERD:          Endo/Other:                      ROS comment: PCOS Abdominal: normal exam            Vascular:          Other Findings:         Anesthesia Plan      MAC and general     ASA 4 - emergent       Induction: intravenous.    MIPS: Postoperative opioids intended  and Prophylactic antiemetics administered.  Anesthetic plan and risks discussed with spouse.    Use of blood products discussed with spouse whom consented to blood products.    Plan discussed with CRNA.    Attending anesthesiologist reviewed and agrees with Preprocedure content            Corean LITTIE Kleine, DO   11/01/2022

## 2022-11-01 NOTE — Progress Notes (Signed)
 ICH Documentation Note     Brief HPI:  SAH transfer from [facility] ED, CTA negative for vascular source. Pt evaluated on arrival to ICU prior to going to angio.     Medical hx  Principal Problem:    Subarachnoid bleed (HCC)  Resolved Problems:    * No resolved hospital problems. *      Traumatic ICH?   NO (see ICH score)   ON   ARRIVAL:  Hunt Hess/Modified Fisher on arrival (if indicated):      HH:2, mF:2    Imaging:   CT Result (most recent):  CTA HEAD NECK W CONTRAST 11/01/2022    Narrative  EXAM: CTA CODE NEURO HEAD AND NECK W CONT    INDICATION: Subarachnoid hemorrhage.    COMPARISON: CT head performed the same day.SABRA    CONTRAST: 100 mL of Isovue -370.    TECHNIQUE:  Unenhanced scout images were obtained to localize the volume for  acquisition.  Multislice helical axial CT angiography was performed from the  aortic arch to the top of the head during uneventful rapid bolus intravenous  contrast administration.   Coronal and sagittal reformations and 3D post  processing was performed.  CT dose reduction was achieved through use of a  standardized protocol tailored for this examination and automatic exposure  control for dose modulation.    FINDINGS:    DELAYED ENHANCEMENT HEAD CT:  Subarachnoid hemorrhage as described previously. No delayed enhancement      CTA NECK:  Great vessels: Normal arch anatomy with the origins patent.  Right subclavian artery: Patent  Left subclavian artery: Patent  Right common carotid artery: Patent  Left common carotid artery: Patent  Cervical right internal carotid artery: Patent with no significant stenosis by  NASCET criteria.  Cervical left internal carotid artery: Patent with no significant stenosis by  NASCET criteria.  Right vertebral artery: Patent  Left vertebral artery: Patent  The lung apices are clear. The thyroid is homogeneous. No cervical  lymphadenopathy.    CTA HEAD:  Right cavernous internal carotid artery: Patent  Left cavernous internal carotid artery: Patent  Anterior  cerebral arteries: Patent  Anterior communicating artery: Patent  Right middle cerebral artery: Patent  Left middle cerebral artery: Patent  Posterior communicating arteries: Patent  Posterior cerebral arteries: Patent  Basilar artery: Patent  Distal vertebral arteries: Patent  No evidence for intracranial aneurysm or hemodynamically significant stenosis.    Impression  No intracranial aneurysm or other focal vascular abnormality accounting for  subarachnoid hemorrhage.      Electronically signed by PAULINE BLANCH       Plan:  NIS consult    NSGY consulted at [facility], deferring to NIS   SBP goal 100-160       Herson Prichard, APRN - NP  Neurocritical Care Nurse Practitioner  [phone]

## 2022-11-01 NOTE — Progress Notes (Signed)
 42 yo with non-traumatic sah.  No hydrocephalus.  Spoke to er md.  Pls contact nis

## 2022-11-01 NOTE — Progress Notes (Signed)
 Anesthesia at bedside and to remain at bedside to manage pt airway, pt VS, pt medications and pt status. Pt VSS at time of transfer.

## 2022-11-01 NOTE — Brief Op Note (Signed)
 Brief Procedure Note      Patient: Christina Stevens MRN: [id number]     Date of Birth: Sep 27, 1980  Age: 42 y.o.  Sex: female      Service: Neurointerventional Surgery    Date of Procedure: 11/01/2022    Pre-Procedure Diagnosis: SAH    Post-Procedure Diagnosis: SAME    Operator: Donnice Skates, MD    Assistant(s): N/A    Procedure(s):   Diagnostic cerebral angiogram  Ultrasound guided vascular access  Placement of arteriotomy closure device    Vessels Studied:   Right CCA  Right ICA  Left CCA  Left ICA  Left ECA  Left vertebral artery  Right vertebral artery    Puncture Site: Right radial artery    Preliminary Findings:   No source for [facility] identified.    Plan:   Q 1 hr neurochecks  SBP 100-160  Follow up imaging in 7 days      Specimens: None    Implants: None    Drains: None    Anesthesia: MAC    Estimated Blood Loss: 5 cc      Apparent Intraoperative Complications: None immediate    Patient Condition: Stable    Disposition: ICU      Signed:   Donnice Skates, MD    [facility] Neurointerventional [facility]  [facility] [place]  [facility]

## 2022-11-01 NOTE — Progress Notes (Addendum)
 TRANSFER - OUT REPORT:    Verbal report given to Christian, RN on JESS SULAK  being transferred to ICU for routine progression of patient care       Report consisted of patient's Situation, Background, Assessment and   Recommendations(SBAR).     Information from the following report(s) Nurse Handoff Report, Surgery Report, Intake/Output, Cardiac Rhythm NSR, Neuro Assessment, and Event Log was reviewed with the receiving nurse.           Lines:   Peripheral IV 11/01/22 Left Antecubital (Active)   Site Assessment Clean, dry & intact 11/01/22 1545   Line Status Infusing 11/01/22 1545   Line Care Connections checked and tightened;Ports disinfected 11/01/22 1545   Phlebitis Assessment No symptoms 11/01/22 1545   Infiltration Assessment 0 11/01/22 1545   Alcohol Cap Used Yes 11/01/22 1545   Dressing Status Clean, dry & intact 11/01/22 1545   Dressing Type Transparent 11/01/22 1545        Opportunity for questions and clarification was provided.      Patient transported with:  Monitor, O2 @ 2lpm, and Registered Nurse    CRNA [place]

## 2022-11-01 NOTE — Progress Notes (Signed)
 11 mL air in TR Band-total  May begin to remove air at [employer]

## 2022-11-01 NOTE — Progress Notes (Signed)
 2000-pt drowsy/easily arouses to name-c/o of pounding H/A, numbness across the bridge of her nose. Also, requesting something to help her sleep.Pt  with baseline L sided weakness though grips equal with slight drift in LLE, right side intact/full sensation in all ext /no neglect-/no facial droop. Emotionally labile.pt needed to void/got to side of bed and ambulated a short distance to/from the bsc w/o help. Pt's wife a bedside and speaking to pt as though she were a child ie; let's go potty, good girl honey pt's wife assisted with peri care as pt stated she was unable to do it. TR band removed and pressure dressing placed scant bruising noted at insertion site. Neuro NP Elyn updated.  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  2154-medicated per order for c/o H/A 7/10-pt speaking in a childlike voice  2300-H/A 5/10 pt satisfied  2330-still with H/A NP Ballagh updated/NP to place order for Mg+  0236- after drinking water pt stated she vomited in the floor.  Pt vomited approx 100cc of water onto the floor/  c/o nausea  medicated per order  0338-pt becomes anxious/tearful /flailing around in bed when awakened still with 7/10 H/A medicated per order and ice pack given  0400-pt calm eyes closed RR 13 non labored RA sat 97%  0500-found pt trying to climb over the siderail to get to bsc .standby assist provided to/from bsc. States H/A still 7/10 and asking for something stronger for H/A   0517-in bed eyes closed pt softly snoring RR 14 w/ RA sat 95%HR 59  0649-awake states her eyes hurt real bad/does not mention having a H/A. Requested cold water/water pitcher refilled w/ice water

## 2022-11-01 NOTE — ED Notes (Signed)
 Code s level 1 paged at this time

## 2022-11-01 NOTE — H&P (Signed)
 CRITICAL CARE ADMISSION NOTE    Name: Christina Stevens   DOB: [birth year]   MRN: [id number]   Date: 11/01/2022      PRINCIPAL ICU DIAGNOSIS   Subarachnoid Hemorrhage     HPI     This is a 42 year old female with history of ADHD, PCOS and functional neurologic disorder who presented to the ED with complaint of HA. Patient states symptoms have been present for 6 days. Constant since onset and progressively worsening. She describes associated left sided weakness. No fever. Additional history limited by mentation.     Of note, patient with history of prior. Prolonged hospitalization in 2019 for parotid abscess. Reported left-sided weakness, vision changes and falls. She was evaluated by neurology due to concern for MS. MRI brain not consistent. Symptoms were felt most consistent with functional neurologic disorder.     In the ED she was hypertensive with BP 179/102. CBC showed WBC 12. CMP normal electrolytes and renal function. EKG NSR. CT head was obtained and revealed small SAH. CTA did not show aneurysm. Patient transferred for NIS evaluation.     ROS negative except as otherwise documented.    A/P     NEUROLOGICAL:    SAH on CT. Exam not consistent and history of functional neurologic disorder. DDX vasculitis (recent rash) or RCVS.   - NIS consulted; plan for angio today  - consider neuro consult pending angiography   - Q1 neuro checks  - repeat CT in the AM   - SBP < 160; continue nicardipine   - TSH, A1C and lipid profile  - TTE  - UDS    ICU DAILY CHECKLIST     Code Status:full  DVT Prophylaxis:SCDs  T/L/D: PIV  SUP: N/A  Diet: NPO  Activity Level:OOB to chair as tolerated  ABCDEF Bundle/Checklist Completed:Yes  Disposition: Stay in ICU  Multidisciplinary Rounds Completed:  Yes  Patient/Family Updated: Yes    Past Medical History:      has a past medical history of ADHD, Epstein Barr infection, MRSA (methicillin resistant staph aureus) culture positive, MS (congenital mitral stenosis), and PCOS (polycystic ovarian  syndrome).    Past Surgical History:      has a past surgical history that includes IR CHOLECYSTOSTOMY PERCUTANEOUS COMPLETE; Appendectomy; and Colonoscopy (N/A, 03/17/2022).    Home Medications:     Prior to Admission medications    Medication Sig Start Date End Date Taking? Authorizing Provider   pantoprazole  (PROTONIX ) 20 MG tablet Take 1 tablet by mouth daily    [provider]   acetaminophen  (TYLENOL ) 325 MG tablet Take 2 tablets by mouth every 6 hours as needed 11/24/20   Automatic Reconciliation, Ar   amphetamine-dextroamphetamine (ADDERALL) 20 MG tablet Take 2 tablets by mouth daily.    Automatic Reconciliation, Ar   atorvastatin  (LIPITOR ) 20 MG tablet Take 1 tablet by mouth daily    Automatic Reconciliation, Ar   ipratropium-albuterol (DUONEB) 0.5-2.5 (3) MG/3ML SOLN nebulizer solution Inhale 3 mLs into the lungs every 4 hours as needed  Patient not taking: Reported on 03/17/2022 11/24/20   Automatic Reconciliation, Ar       Allergies/Social/Family History:     Allergies   Allergen Reactions    Oxycodone -Acetaminophen  Itching      Social History     Tobacco Use    Smoking status: Every Day    Smokeless tobacco: Never   Substance Use Topics    Alcohol use: No      No family history on  file.      OBJECTIVE:     There were no vitals taken for this visit.  Physical Exam  Gen: NAD  HEENT: NC/AT, supple  Cardiac: RRR, no M/R/G  Pulm: CTA bilaterally  ABD: S/NT/ND, normal bowel sounds  Extremities: no edema  Skin: no rash  Neuro: A/O X 3, 4/5 LUE; inconsistent exam    I have personally reviewed and interpreted all relevant imaging and laboratory data.     CRITICAL CARE DOCUMENTATION  I had a face to face encounter with the patient, reviewed and interpreted patient data including clinical events, labs, images, vital signs, I/O's, and examined patient.  I have discussed the case and the plan and management of the patient's care with the consulting services, the bedside nurses and the respiratory therapist.       NOTE OF PERSONAL INVOLVEMENT IN CARE   This patient has a high probability of imminent, clinically significant deterioration, which requires the highest level of preparedness to intervene urgently. I participated in the decision-making and personally managed or directed the management of the following life and organ supporting interventions that required my frequent assessment to treat or prevent imminent deterioration.    I personally spent 45 minutes of critical care time.  This is time spent at this critically ill patient's bedside actively involved in patient care as well as the coordination of care.  This does not include any procedural time which has been billed separately.    Lonni Blush M.D.  [facility]  11/01/2022

## 2022-11-01 NOTE — Progress Notes (Signed)
 Pt arrives via ICU bed to angio department accompanied by ICU RN, Sherlean +tech for urgent diagnostic cerebral angiogram with possible endovascular treatment procedure. All assessments completed and consent was reviewed.  Education given was regarding procedure, MAC possible GA sedation, post-procedure care and  management/follow-up. Opportunity for questions was provided and all questions and concerns were addressed.

## 2022-11-01 NOTE — Consults (Signed)
 NeuroInterventional Surgery Consult  Christina Staheli, APRN - NP    Patient: Christina Stevens MRN: [id number]  SSN: [id number]    Date of Birth: May 28, 1980  Age: 42 y.o.  Sex: female      Chief Complaint: headache    HPI:     Christina Stevens is a 42 y.o.  White (non-Hispanic)  female with a pmh of PCOS, peripheral neuropathy, and ADHD who presented to [facility] ED today reporting a severe headache x6 days with slurred speech and stating she passed out today.  A Code Stroke was called and a stat CT head was obtained which showed acute SAH. CTA head/neck was then obtained which was negative for a vascular source. BP noted to be 179/102 on arrival to ED. NSGY was consulted and recommended NIS consult. Pt was then transferred to [facility] ICU for higher level of care. Pt wife at bedside reports pt takes adderall for ADHD, last took this yesterday. She reports the pt has a prescription for medical marijuana and smokes marijuana 3 times per day every single day. She denies pt using any street drugs and she states the pt does not drink any alcohol.     Pt follows with [facility], was last seen 09/27/22. Per notes, pt has had left sided weakness since 2019 peripheral neuropathy, foot drop, balance and gait disturbance, and memory issues that started after a prolonged hospitalization for sepsis. She underwent EMG, MRI brain, C-spine, T-spine, lumbar spine as part of the work up. 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    Past Medical History:   Diagnosis Date    ADHD     Elbert Shine infection     MRSA (methicillin resistant staph aureus) culture positive     MS (congenital mitral stenosis)     PCOS (polycystic ovarian syndrome)      No family history on file.  Social History     Tobacco Use    Smoking status: Every Day    Smokeless tobacco: Never   Substance Use Topics    Alcohol use: No      Current Facility-Administered Medications   Medication Dose Route  Frequency Provider Last Rate Last Admin    sodium chloride  flush 0.9 % injection 5-40 mL  5-40 mL IntraVENous 2 times per day Effie Sor, APRN - CNP        sodium chloride  flush 0.9 % injection 5-40 mL  5-40 mL IntraVENous PRN Effie Sor, APRN - CNP        0.9 % sodium chloride  infusion   IntraVENous PRN Effie Sor, APRN - CNP        acetaminophen  (TYLENOL ) tablet 650 mg  650 mg Oral Q4H PRN Effie Sor, APRN - CNP        ondansetron  (ZOFRAN -ODT) disintegrating tablet 4 mg  4 mg Oral Q8H PRN Effie Sor, APRN - CNP        Or    ondansetron  (ZOFRAN ) injection 4 mg  4 mg IntraVENous Q6H PRN Effie Sor, APRN - CNP        magnesium  sulfate 1000 mg in dextrose  5% 100 mL IVPB  1,000 mg IntraVENous PRN Effie Sor, APRN - CNP        niCARdipine  (CARDENE ) 25 mg in sodium chloride  0.9 % 250 mL infusion (Vial2Bag)  2.5-15 mg/hr IntraVENous Continuous Effie Sor, APRN - CNP 50 mL/hr at 11/01/22 1659 5 mg/hr at 11/01/22 1659  atorvastatin  (LIPITOR ) tablet 20 mg  20 mg Oral Daily Tanda Lonni SAUNDERS, MD        pantoprazole  (PROTONIX ) tablet 20 mg  20 mg Oral Daily Tanda Lonni SAUNDERS, MD        niMODipine  (NIMOTOP ) capsule 60 mg  60 mg Oral 6 times per day Binford, Jesslyn HERO, APRN - NP        0.9 % sodium chloride  infusion   IntraVENous Continuous Binford, Travia M, APRN - NP         Facility-Administered Medications Ordered in Other Encounters   Medication Dose Route Frequency Provider Last Rate Last Admin    heparin  4000 units/1000 mL (4 units/mL) in NS infusion **FOR ANGIO USE ONLY**   IntraVENous PRN Massie Donnice PARAS, MD        lidocaine  (LMX) 4 % cream    PRN Massie Donnice PARAS, MD   1 Tube at 11/01/22 1644    nitroglycerin  (NITRO-BID ) 2 % ointment    PRN Massie Donnice PARAS, MD   1 inch at 11/01/22 1644     Allergies   Allergen Reactions    Oxycodone -Acetaminophen  Itching       Review of Systems:  11pt ROS negative except for above    Objective:     Vitals:    11/01/22 1600   BP: 135/72   Pulse: 64    Resp: 17   Temp: 98.9 F (37.2 C)   SpO2: 100%         Physical Exam:  GENERAL: Ill appearing adult female  SKIN: Warm, color appropriate for ethnicity, diaphoretic on exam.     Neurologic Exam:  Mental Status:    Alert and oriented x 4.  Appropriate affect, mood and behavior.       Speech/Language:      Mild dysarthria present. Normal fluency, repetition, comprehension and naming. Follows commands.     Cranial Nerves:     Pupils 3 mm, equal, round and briskly reactive to light.  Visual fields full to confrontation.  Extraocular movements intact.     Facial sensation diminished on left side.  Full facial strength, no asymmetry.   Hearing grossly intact bilaterally.  Tongue protrudes to midline, palate elevates symmetrically.       Motor:      Left arm and leg drift present. No drift in right arm/leg.   Bulk and tone normal.   No involuntary movements.    Sensation:      Sensation diminished in left arm/leg.    Coordination:   FTN and HTS deferred, pt to nauseated at this time to perform.    Gait:   Deferred.    Labs:  Recent Results (from the past 12 hour(s))   POCT Glucose    Collection Time: 11/01/22  1:06 PM   Result Value Ref Range    POC Glucose 111 65 - 117 mg/dL    Performed by: JULIETTA Maxwell EDT    CBC with Auto Differential    Collection Time: 11/01/22  1:20 PM   Result Value Ref Range    WBC 12.1 (H) 3.6 - 11.0 K/uL    RBC 4.79 3.80 - 5.20 M/uL    Hemoglobin 15.3 11.5 - 16.0 g/dL    Hematocrit 56.0 64.9 - 47.0 %    MCV 91.6 80.0 - 99.0 FL    MCH 31.9 26.0 - 34.0 PG    MCHC 34.9 30.0 - 36.5 g/dL    RDW 88.3 88.4 - 85.4 %  Platelets 345 150 - 400 K/uL    MPV 10.4 8.9 - 12.9 FL    Nucleated RBCs 0.0 0 PER 100 WBC    nRBC 0.00 0.00 - 0.01 K/uL    Neutrophils % 63 32 - 75 %    Lymphocytes % 27 12 - 49 %    Monocytes % 6 5 - 13 %    Eosinophils % 4 0 - 7 %    Basophils % 0 0 - 1 %    Immature Granulocytes % 0 0.0 - 0.5 %    Neutrophils Absolute 7.7 1.8 - 8.0 K/UL    Lymphocytes Absolute 3.3 0.8 - 3.5 K/UL     Monocytes Absolute 0.7 0.0 - 1.0 K/UL    Eosinophils Absolute 0.5 (H) 0.0 - 0.4 K/UL    Basophils Absolute 0.1 0.0 - 0.1 K/UL    Immature Granulocytes Absolute 0.0 0.00 - 0.04 K/UL    Differential Type AUTOMATED     Comprehensive Metabolic Panel    Collection Time: 11/01/22  1:20 PM   Result Value Ref Range    Sodium 138 136 - 145 mmol/L    Potassium 4.1 3.5 - 5.1 mmol/L    Chloride 108 97 - 108 mmol/L    CO2 25 21 - 32 mmol/L    Anion Gap 5 2 - 12 mmol/L    Glucose 113 (H) 65 - 100 mg/dL    BUN 9 6 - 20 MG/DL    Creatinine 9.17 9.44 - 1.02 MG/DL    BUN/Creatinine Ratio 11 (L) 12 - 20      Est, Glom Filt Rate >90 >60 ml/min/1.85m2    Calcium  9.6 8.5 - 10.1 MG/DL    Total Bilirubin 0.3 0.2 - 1.0 MG/DL    ALT 17 12 - 78 U/L    AST 16 15 - 37 U/L    Alk Phosphatase 70 45 - 117 U/L    Total Protein 7.2 6.4 - 8.2 g/dL    Albumin 4.0 3.5 - 5.0 g/dL    Globulin 3.2 2.0 - 4.0 g/dL    Albumin/Globulin Ratio 1.3 1.1 - 2.2     Protime-INR    Collection Time: 11/01/22  1:20 PM   Result Value Ref Range    INR 0.9 0.9 - 1.1      Protime 9.8 9.0 - 11.1 sec   Troponin    Collection Time: 11/01/22  1:20 PM   Result Value Ref Range    Troponin, High Sensitivity <4 0 - 51 ng/L   EKG 12 Lead    Collection Time: 11/01/22  1:54 PM   Result Value Ref Range    Ventricular Rate 68 BPM    Atrial Rate 68 BPM    P-R Interval 152 ms    QRS Duration 78 ms    Q-T Interval 456 ms    QTc Calculation (Bazett) 484 ms    P Axis 69 degrees    R Axis 68 degrees    T Axis 68 degrees    Diagnosis       Normal sinus rhythm  Septal infarct (cited on or before 01-Mar-2021)  Abnormal ECG  When compared with ECG of 01-Mar-2021 08:54,  No significant change was found        Imaging (my read):  CT head: Acute SAH  CTA head/neck: Negative for vascular source of hemorrhage.    Assessment/Plan:     Acute non-traumatic SAH, CTA negative for vascular source. Hunt Hess 2, Radiographer, Therapeutic  Grade 2, baseline mRS: 1    - Plans for cerebral angiogram and possible endovascular  intervention if vascular target is identified today with Dr Massie   - Day 0/21 of Nimotop  to prevent delayed cerebral ischemia   - NS at 75 ml/hr to maintain euvolemia   - Strict I&O   - ECHO pending   - q 1 neuro checks   - TCDs daily starting tomorrow   - SBP goal 100-160   - PT/OT/SLP evals   - Assess NICOM q shift for fluid responsiveness    D/w Dr Massie and Dr Tanda. Consent obtained by Dr Massie from pt, however, she is too nauseated to read/sign consent form so Dr Massie also discussed with pt's wife at father at bedside.     Thank you for this consult and participating in the care of this patient.    I have spent 75 minutes of time involved in chart review, lab review, imaging review, consultations with specialists and attendings, family discussion/decision making and documentation.     Signed By: Tawni Skeeters, APRN - NP, Neurocritical Care Nurse Practitioner    November 01, 2022

## 2022-11-01 NOTE — ED Provider Notes (Signed)
 EMERGENCY DEPARTMENT HISTORY AND PHYSICAL EXAM      Date: 11/01/2022  Patient Name: Christina Stevens    History of Presenting Illness     Chief Complaint   Patient presents with   . Headache     Pt in WC BIB family member. Pt friend states the pt texted her around 10 am and told her she passed out and began having slurred speech. Pt c/o severe HA today, pt crying and hyperventilating in TR. Pt also unable to balance her head straight, keeps falling over.    SABRA Aphasia     Pt BG 111 in TR. Srihith Aquilino MD in TR to eval pt. Pt has slurred speech currently.          HPI: History From: friend, patient, History limited by: dysarthria   Christina Stevens, 42 y.o. female presents to the ED with cc of headache and difficulty speaking.  Her headache started 6 days ago, she reports it is severe at onset, waking her from sleep.  She has been taking headache medications without relief.  She denies any history of chronic headache.  Today, the pain became much worse, and she had an episode of syncope around 10 AM.  Per her friend, when she spoke with her at 6:30 AM, her speech was normal, and her wife last talked her around 48 AM and her speech was normal.  After the syncope, she has slurred speech and difficulty getting words out.  She says she feels weak and shaky all over, however weaker on the left side.  This left-sided weakness has been worsening over the past 2 days.  She reports a history of PCOS, she takes atorvastatin  and Adderall no other medications.  She is crying, moaning, speech is halting, difficult to ascertain further history.          There are no other complaints, changes, or physical findings at this time.    PCP: Dalia Ozell RODES, APRN - NP    No current facility-administered medications on file prior to encounter.     Current Outpatient Medications on File Prior to Encounter   Medication Sig Dispense Refill   . pantoprazole  (PROTONIX ) 20 MG tablet Take 1 tablet by mouth daily     . acetaminophen  (TYLENOL ) 325 MG  tablet Take 2 tablets by mouth every 6 hours as needed     . amphetamine-dextroamphetamine (ADDERALL) 20 MG tablet Take 2 tablets by mouth daily.     . atorvastatin  (LIPITOR ) 20 MG tablet Take 1 tablet by mouth daily     . ipratropium-albuterol (DUONEB) 0.5-2.5 (3) MG/3ML SOLN nebulizer solution Inhale 3 mLs into the lungs every 4 hours as needed (Patient not taking: Reported on 03/17/2022)         Past History     Past Medical History:  Past Medical History:   Diagnosis Date   . ADHD    . Elbert Shine infection    . MRSA (methicillin resistant staph aureus) culture positive    . MS (congenital mitral stenosis)    . PCOS (polycystic ovarian syndrome)        Past Surgical History:  Past Surgical History:   Procedure Laterality Date   . APPENDECTOMY     . COLONOSCOPY N/A 03/17/2022    COLONOSCOPY W/ ENDOSCOPIC MUCOSAL RESECTION performed by Danial Bias, MD at [facility] ENDOSCOPY   . IR CHOLECYSTOSTOMY PERCUTANEOUS COMPLETE         Family History:  No family history on file.  Social History:  Social History     Tobacco Use   . Smoking status: Every Day   . Smokeless tobacco: Never   Substance Use Topics   . Alcohol use: No   . Drug use: Yes     Types: Marijuana Oda)       Allergies:  Allergies   Allergen Reactions   . Oxycodone -Acetaminophen  Itching         Physical Exam   Physical Exam  Constitutional:       General: She is in acute distress.      Comments: Tearful, uncomfortable appearing, moaning   HENT:      Head: Normocephalic and atraumatic.   Eyes:      Extraocular Movements: Extraocular movements intact.   Cardiovascular:      Rate and Rhythm: Normal rate and regular rhythm.   Pulmonary:      Effort: Pulmonary effort is normal.      Breath sounds: Normal breath sounds.   Abdominal:      General: There is no distension.      Palpations: Abdomen is soft.   Musculoskeletal:         General: No deformity.   Skin:     General: Skin is warm and dry.   Neurological:      Comments: Speech is halting but  understandable, symmetric facial expressions.  She has weakness in all of her extremities, she is able to keep her arms or legs outstretched.  Grip strength seems grossly symmetric.  She reports diminished sensation over her body diffusely, this does not appear to be unilateral or asymmetric.   Psychiatric:      Comments: Anxious, tearful         Diagnostic Study Results     Labs -     Recent Results (from the past 24 hour(s))   POCT Glucose    Collection Time: 11/01/22  1:06 PM   Result Value Ref Range    POC Glucose 111 65 - 117 mg/dL    Performed by: JULIETTA Maxwell EDT    CBC with Auto Differential    Collection Time: 11/01/22  1:20 PM   Result Value Ref Range    WBC 12.1 (H) 3.6 - 11.0 K/uL    RBC 4.79 3.80 - 5.20 M/uL    Hemoglobin 15.3 11.5 - 16.0 g/dL    Hematocrit 56.0 64.9 - 47.0 %    MCV 91.6 80.0 - 99.0 FL    MCH 31.9 26.0 - 34.0 PG    MCHC 34.9 30.0 - 36.5 g/dL    RDW 88.3 88.4 - 85.4 %    Platelets 345 150 - 400 K/uL    MPV 10.4 8.9 - 12.9 FL    Nucleated RBCs 0.0 0 PER 100 WBC    nRBC 0.00 0.00 - 0.01 K/uL    Neutrophils % 63 32 - 75 %    Lymphocytes % 27 12 - 49 %    Monocytes % 6 5 - 13 %    Eosinophils % 4 0 - 7 %    Basophils % 0 0 - 1 %    Immature Granulocytes % 0 0.0 - 0.5 %    Neutrophils Absolute 7.7 1.8 - 8.0 K/UL    Lymphocytes Absolute 3.3 0.8 - 3.5 K/UL    Monocytes Absolute 0.7 0.0 - 1.0 K/UL    Eosinophils Absolute 0.5 (H) 0.0 - 0.4 K/UL    Basophils Absolute 0.1 0.0 - 0.1 K/UL  Immature Granulocytes Absolute 0.0 0.00 - 0.04 K/UL    Differential Type AUTOMATED     Comprehensive Metabolic Panel    Collection Time: 11/01/22  1:20 PM   Result Value Ref Range    Sodium 138 136 - 145 mmol/L    Potassium 4.1 3.5 - 5.1 mmol/L    Chloride 108 97 - 108 mmol/L    CO2 25 21 - 32 mmol/L    Anion Gap 5 2 - 12 mmol/L    Glucose 113 (H) 65 - 100 mg/dL    BUN 9 6 - 20 MG/DL    Creatinine 9.17 9.44 - 1.02 MG/DL    BUN/Creatinine Ratio 11 (L) 12 - 20      Est, Glom Filt Rate >90 >60 ml/min/1.46m2     Calcium  9.6 8.5 - 10.1 MG/DL    Total Bilirubin 0.3 0.2 - 1.0 MG/DL    ALT 17 12 - 78 U/L    AST 16 15 - 37 U/L    Alk Phosphatase 70 45 - 117 U/L    Total Protein 7.2 6.4 - 8.2 g/dL    Albumin 4.0 3.5 - 5.0 g/dL    Globulin 3.2 2.0 - 4.0 g/dL    Albumin/Globulin Ratio 1.3 1.1 - 2.2     Protime-INR    Collection Time: 11/01/22  1:20 PM   Result Value Ref Range    INR 0.9 0.9 - 1.1      Protime 9.8 9.0 - 11.1 sec   Troponin    Collection Time: 11/01/22  1:20 PM   Result Value Ref Range    Troponin, High Sensitivity <4 0 - 51 ng/L   EKG 12 Lead    Collection Time: 11/01/22  1:54 PM   Result Value Ref Range    Ventricular Rate 68 BPM    Atrial Rate 68 BPM    P-R Interval 152 ms    QRS Duration 78 ms    Q-T Interval 456 ms    QTc Calculation (Bazett) 484 ms    P Axis 69 degrees    R Axis 68 degrees    T Axis 68 degrees    Diagnosis       Normal sinus rhythm  Septal infarct (cited on or before 01-Mar-2021)  Abnormal ECG  When compared with ECG of 01-Mar-2021 08:54,  No significant change was found         Radiologic Studies -   CTA HEAD NECK W CONTRAST   Final Result   No intracranial aneurysm or other focal vascular abnormality accounting for   subarachnoid hemorrhage.         Electronically signed by PAULINE BLANCH      CT HEAD WO CONTRAST   Final Result   Small acute subarachnoid hemorrhage in the basilar cisterns.      Findings were discussed with Dr. Bella at 1:19 p.m.         Electronically signed by PAULINE BLANCH              Medical Decision Making   I am the first provider for this patient.    I reviewed the vital signs, available nursing notes, past medical history, past surgical history, family history and social history.    Vital Signs-Reviewed the patient's vital signs.  Patient Vitals for the past 12 hrs:   Temp Pulse Resp BP SpO2   11/01/22 1445 -- 63 18 121/78 --   11/01/22 1430 -- 61 17 (!) 145/116 100 %  11/01/22 1400 -- 66 12 (!) 141/103 100 %   11/01/22 1304 98.8 F (37.1 C) 73 25 (!) 179/102 100  %         Provider Notes (Medical Decision Making):   42 year old presenting with headache, dysarthria, syncope.  Given onset of symptoms and difficulty with speech, Level One code stroke initiated.  With associated headache, differential includes atypical migraine, intracranial hemorrhage, intracranial mass.  She is uncomfortable appearing, moaning.  Pain medications also ordered.  She is hypertensive, otherwise vitals unremarkable.    ED Course:     SABRA    Medications   sodium chloride  0.9 % bolus 1,000 mL (has no administration in time range)   niCARdipine  (CARDENE ) 2.5 MG/ML injection (  Canceled Entry 11/01/22 1407)   niCARdipine  (CARDENE ) 25 mg in sodium chloride  0.9 % 250 mL infusion (Vial2Bag) (5 mg/hr IntraVENous New Bag 11/01/22 1349)   ondansetron  (ZOFRAN ) injection 4 mg (has no administration in time range)   fentaNYL  (SUBLIMAZE ) injection 100 mcg (has no administration in time range)   fentaNYL  (SUBLIMAZE ) injection 50 mcg (50 mcg IntraVENous Given 11/01/22 1347)   iopamidol  (ISOVUE -370) 76 % injection 100 mL (100 mLs IntraVENous Given 11/01/22 1332)   HYDROmorphone  HCl PF (DILAUDID ) injection 0.5 mg (0.5 mg IntraVENous Given 11/01/22 1406)      I spoke with the reading radiologist, he reports subarachnoid hemorrhage.    Nicardipine  drip ordered.  Fentanyl  ordered.  Patient continues to be moaning in pain.    Base metabolic panel with normal creatinine of 0.82, hemoglobin normal at 15.3.  CTA reveals no intracranial aneurysm or focal vascular abnormality.    EKG is performed at 13: 54, independently interpreted by myself as sinus rhythm at a rate of 68, no ST segment elevation concerning for ACS.    Reevaluation, patient continues to be moaning in pain, further pain medications ordered.  Her speech continues to be halting, however neuroexam otherwise stable.  Blood pressure improved on nicardipine  drip with systolic in 140s, will titrate to systolic less than 140.    Repeat blood pressure improved to 121/78.   Patient continues to be in significant pain.  Further fentanyl  ordered.      ED Course as of 11/01/22 1515   Mon Nov 01, 2022   1342 Spoke with Dr. Marsa of neurosurgery, agrees with plan, recommends CTA and neuroIR consult [CV]   1349 Spoke with Dr. Massie of neuroIR, recommends SBP<140, transfer [facility] ICU [CV]   1356 Spoke with Burnard Sink NP who accepts patient, accepting provider Dr. Lonni Blush [CV]      ED Course User Index  [CV] Matisyn Cabeza, Estefana BRAVO, MD        Clinical Management Tools:       Critical Care Time:     I have spent 75 minutes of critical care time in evaluating and treating this patient.  This includes time spent at bedside, time with family and decision makers, documentation, review of labs and imaging, and/or consultation with specialists.  It does not include time spent on separately billed procedures.     This patient presents with a critical illness or injury that acutely impairs one or more vital organ systems such that there is a high probability of imminent or life threatening deterioration in the patient's condition.  This case involved decision making of high complexity to assess, manipulate, and support vital organ system failure and/or to prevent further life threatening deterioration of the patient's condition. Failure to initiate these interventions  on an urgent basis would likely result in sudden, clinically significant or life threatening deterioration in the patient's condition.    Abnormal findings supporting critical care: Subarachnoid hemorrhage, hypertension  Interventions to support critical care: Nicardipine , neurochecks, transfer, consultation  Failure to intervene may result in: CNS decompensation    Disposition:  Transfer      PLAN:  1.      Medication List        ASK your doctor about these medications      acetaminophen  325 MG tablet  Commonly known as: TYLENOL      amphetamine-dextroamphetamine 20 MG tablet  Commonly known as: ADDERALL     atorvastatin  20 MG  tablet  Commonly known as: LIPITOR      ipratropium 0.5 mg-albuterol 2.5 mg 0.5-2.5 (3) MG/3ML Soln nebulizer solution  Commonly known as: DUONEB     pantoprazole  20 MG tablet  Commonly known as: PROTONIX             2. No follow-up provider specified.  Return to ED if worse     Diagnosis     Clinical Impression: Acute subarachnoid hemorrhage               Marinell Estefana BRAVO, MD  11/01/22 1526

## 2022-11-02 ENCOUNTER — Inpatient Hospital Stay: Admit: 2022-11-02 | Discharge: 2022-11-08 | Payer: PRIVATE HEALTH INSURANCE | Primary: Primary Care

## 2022-11-02 ENCOUNTER — Inpatient Hospital Stay: Admit: 2022-11-02 | Payer: PRIVATE HEALTH INSURANCE | Primary: Primary Care

## 2022-11-02 DIAGNOSIS — I609 Nontraumatic subarachnoid hemorrhage, unspecified: Secondary | ICD-10-CM

## 2022-11-02 LAB — COMPREHENSIVE METABOLIC PANEL
ALT: 16 U/L (ref 12–78)
AST: 19 U/L (ref 15–37)
Albumin/Globulin Ratio: 1.1 (ref 1.1–2.2)
Albumin: 3.6 g/dL (ref 3.5–5.0)
Alk Phosphatase: 70 U/L (ref 45–117)
Anion Gap: 7 mmol/L (ref 2–12)
BUN/Creatinine Ratio: 8 — ABNORMAL LOW (ref 12–20)
BUN: 6 mg/dL (ref 6–20)
CO2: 23 mmol/L (ref 21–32)
Calcium: 8.7 mg/dL (ref 8.5–10.1)
Chloride: 106 mmol/L (ref 97–108)
Creatinine: 0.8 mg/dL (ref 0.55–1.02)
Est, Glom Filt Rate: >90 mL/min/{1.73_m2} (ref >60)
Globulin: 3.2 g/dL (ref 2.0–4.0)
Glucose: 115 mg/dL — ABNORMAL HIGH (ref 65–100)
Potassium: 3.8 mmol/L (ref 3.5–5.1)
Sodium: 136 mmol/L (ref 136–145)
Total Bilirubin: 0.4 mg/dL (ref 0.2–1.0)
Total Protein: 6.8 g/dL (ref 6.4–8.2)

## 2022-11-02 LAB — BASIC METABOLIC PANEL
Anion Gap: 5 mmol/L (ref 2–12)
BUN/Creatinine Ratio: 9 — ABNORMAL LOW (ref 12–20)
BUN: 7 mg/dL (ref 6–20)
CO2: 24 mmol/L (ref 21–32)
Calcium: 8.4 mg/dL — ABNORMAL LOW (ref 8.5–10.1)
Chloride: 109 mmol/L — ABNORMAL HIGH (ref 97–108)
Creatinine: 0.81 mg/dL (ref 0.55–1.02)
Est, Glom Filt Rate: >90 mL/min/{1.73_m2} (ref >60)
Glucose: 115 mg/dL — ABNORMAL HIGH (ref 65–100)
Potassium: 3.6 mmol/L (ref 3.5–5.1)
Sodium: 138 mmol/L (ref 136–145)

## 2022-11-02 LAB — ECHO (TTE) COMPLETE (PRN CONTRAST/BUBBLE/STRAIN/3D)
AV Area by Peak Velocity: 2.1 cm2
AV Peak Gradient: 15 mmHg
AV Peak Velocity: 1.9 m/s
AV Velocity Ratio: 0.95
AVA/BSA Peak Velocity: 1.2 cm2/m2
Ao Root Index: 1.75 cm/m2
Aortic Root: 3 cm
Body Surface Area: 1.76 m2
E/E' Lateral: 6.42
E/E' Ratio (Averaged): 7.39
E/E' Septal: 8.36
EF Physician: 65 %
Est. RA Pressure: 10 mmHg
Fractional Shortening 2D: 38 % (ref 28–44)
IVSd: 0.7 cm (ref 0.6–0.9)
LA Diameter: 4 cm
LA Size Index: 2.34 cm/m2
LA/AO Root Ratio: 1.33
LV E' Lateral Velocity: 15.1 cm/s
LV E' Septal Velocity: 11.6 cm/s
LV Mass 2D Index: 50.9 g/m2 (ref 43–95)
LV Mass 2D: 87.1 g (ref 67–162)
LV RWT Ratio: 0.27
LVIDd Index: 2.63 cm/m2
LVIDd: 4.5 cm (ref 3.9–5.3)
LVIDs Index: 1.64 cm/m2
LVIDs: 2.8 cm
LVOT Area: 2.3 cm2
LVOT Diameter: 1.7 cm
LVOT Peak Gradient: 13 mmHg
LVOT Peak Velocity: 1.8 m/s
LVPWd: 0.6 cm (ref 0.6–0.9)
MV A Velocity: 0.59 m/s
MV Area by PHT: 4.2 cm2
MV E Velocity: 0.97 m/s
MV E Wave Deceleration Time: 179.4 ms
MV E/A: 1.64
MV PHT: 52 ms
PV Max Velocity: 1 m/s
PV Peak Gradient: 4 mmHg
RV Free Wall Peak S': 17.4 cm/s
RVIDd: 3.1 cm
TAPSE: 3.3 cm (ref 1.7–?)

## 2022-11-02 LAB — CBC
Hematocrit: 37 % (ref 35.0–47.0)
Hemoglobin: 13.2 g/dL (ref 11.5–16.0)
MCH: 33.3 pg (ref 26.0–34.0)
MCHC: 35.7 g/dL (ref 30.0–36.5)
MCV: 93.4 FL (ref 80.0–99.0)
MPV: 11.3 FL (ref 8.9–12.9)
Nucleated RBCs: 0 /100{WBCs}
Platelets: 121 10*3/uL — ABNORMAL LOW (ref 150–400)
RBC: 3.96 M/uL (ref 3.80–5.20)
RDW: 12.1 % (ref 11.5–14.5)
WBC: 15.5 10*3/uL — ABNORMAL HIGH (ref 3.6–11.0)
nRBC: 0 10*3/uL (ref 0.00–0.01)

## 2022-11-02 LAB — MAGNESIUM: Magnesium: 2.4 mg/dL (ref 1.6–2.4)

## 2022-11-02 LAB — URINE DRUG SCREEN
Amphetamine, Urine: NEGATIVE
Barbiturates, Urine: NEGATIVE
Benzodiazepines, Urine: POSITIVE — AB
Cocaine, Urine: NEGATIVE
Methadone, Urine: NEGATIVE
Opiates, Urine: POSITIVE — AB
Phencyclidine, Urine: NEGATIVE
THC, TH-Cannabinol, Urine: POSITIVE — AB

## 2022-11-02 LAB — HEMOGLOBIN A1C
Estimated Avg Glucose: 103 mg/dL
Hemoglobin A1C: 5.2 % (ref 4.0–5.6)

## 2022-11-02 LAB — PHOSPHORUS: Phosphorus: 3.1 mg/dL (ref 2.6–4.7)

## 2022-11-02 MED ORDER — BUTALBITAL-APAP-CAFFEINE 50-325-40 MG PO TABS
50-325-40 | Freq: Four times a day (QID) | ORAL | Status: DC | PRN
Start: 2022-11-02 — End: 2022-11-06
  Administered 2022-11-02 – 2022-11-05 (×14): 1 via ORAL

## 2022-11-02 MED ORDER — METOCLOPRAMIDE HCL 5 MG/ML IJ SOLN
5 | Freq: Four times a day (QID) | INTRAMUSCULAR | Status: DC | PRN
Start: 2022-11-02 — End: 2022-11-06
  Administered 2022-11-02 – 2022-11-05 (×15): 10 mg via INTRAVENOUS

## 2022-11-02 MED ORDER — MAGNESIUM SULFATE IN D5W 1-5 GM/100ML-% IV SOLN
1-5 | INTRAVENOUS | Status: AC
Start: 2022-11-02 — End: 2022-11-02

## 2022-11-02 MED ORDER — ACETAMINOPHEN 500 MG PO TABS
500 | Freq: Three times a day (TID) | ORAL | Status: DC
Start: 2022-11-02 — End: 2022-11-06
  Administered 2022-11-02 – 2022-11-05 (×8): 1000 mg via ORAL

## 2022-11-02 MED ADMIN — magnesium sulfate 1000 mg in dextrose 5% 100 mL IVPB: 1000 mg | INTRAVENOUS | @ 04:00:00 | NDC 63323010800

## 2022-11-02 MED ADMIN — acetaminophen (TYLENOL) tablet 650 mg: 650 mg | ORAL | @ 15:00:00 | NDC 00904677361

## 2022-11-02 MED FILL — BUTALBITAL-APAP-CAFFEINE 50-325-40 MG PO TABS: 50-325-40 MG | ORAL | Qty: 1

## 2022-11-02 MED FILL — METOCLOPRAMIDE HCL 5 MG/ML IJ SOLN: 5 MG/ML | INTRAMUSCULAR | Qty: 2

## 2022-11-02 MED FILL — NIMODIPINE 30 MG PO CAPS: 30 MG | ORAL | Qty: 1

## 2022-11-02 MED FILL — NIMODIPINE 30 MG PO CAPS: 30 MG | ORAL | Qty: 2

## 2022-11-02 MED FILL — ACETAMINOPHEN EXTRA STRENGTH 500 MG PO TABS: 500 MG | ORAL | Qty: 2

## 2022-11-02 MED FILL — LIPITOR 20 MG PO TABS: 20 MG | ORAL | Qty: 1

## 2022-11-02 MED FILL — ONDANSETRON HCL 4 MG/2ML IJ SOLN: 4 MG/2ML | INTRAMUSCULAR | Qty: 2

## 2022-11-02 MED FILL — MAGNESIUM SULFATE IN D5W 1-5 GM/100ML-% IV SOLN: 1-5 GM/100ML-% | INTRAVENOUS | Qty: 100

## 2022-11-02 MED FILL — ACETAMINOPHEN 325 MG PO TABS: 325 MG | ORAL | Qty: 2

## 2022-11-02 MED FILL — PANTOPRAZOLE SODIUM 20 MG PO TBEC: 20 MG | ORAL | Qty: 1

## 2022-11-02 NOTE — Progress Notes (Signed)
 OCCUPATIONAL THERAPY EVALUATION/DISCHARGE  Patient: Christina Stevens (42 y.o. female)  Date: 11/02/2022  Primary Diagnosis: Subarachnoid bleed (HCC) [I60.9]         Precautions: Fall Risk                  ASSESSMENT :  Based on the objective data below, the patient presents at an overall SBA level with LE ADLs, toileting and functional mobility following admission for [facility].  Pt mildly impulsive, which she states is her baseline, and with no LOB.  No further skilled OT required at this time.  VSS, however c/o HA 8/10.  RN aware.    Functional Outcome Measure:  The patient scored 66/66 on the UE Fugl-Meyer Assessment outcome measure.    Further skilled acute occupational therapy is not indicated at this time.     PLAN :  Recommend with staff: up with SBA for safety    Recommendation for discharge: (in order for the patient to meet his/her long term goals):   No skilled occupational therapy    Other factors to consider for discharge: no additional factors    IF patient discharges home will need the following DME: none for OT     SUBJECTIVE:   Patient stated, "I have a wicked HA."    OBJECTIVE DATA SUMMARY:     Past Medical History:   Diagnosis Date    ADHD     Elbert Shine infection     MRSA (methicillin resistant staph aureus) culture positive     MS (congenital mitral stenosis)     PCOS (polycystic ovarian syndrome)      Past Surgical History:   Procedure Laterality Date    APPENDECTOMY      COLONOSCOPY N/A 03/17/2022    COLONOSCOPY W/ ENDOSCOPIC MUCOSAL RESECTION performed by Danial Bias, MD at [facility] ENDOSCOPY    IR CHOLECYSTOSTOMY PERCUTANEOUS COMPLETE         Prior Level of Function/Environment/Context: ADL Assistance: Independent,  Homemaking Assistance: Independent, Ambulation Assistance: Independent, Transfer Assistance: Independent, Active Driver: Yes     Expanded or extensive additional review of patient history:   Social/Functional History  Lives With: Spouse (wife)  Type of Home: House  Home Layout: Two  level, Bed/Bath upstairs  Home Access: Stairs to enter with rails  Entrance Stairs - Number of Steps: 5  Entrance Stairs - Rails: Both  Bathroom Shower/Tub: Cabin Crew: None  Home Equipment: None  Has the patient had two or more falls in the past year or any fall with injury in the past year?: No  ADL Assistance: Psychologist, Sport And Exercise Assistance: Independent  Ambulation Assistance: Independent  Transfer Assistance: Surveyor, Minerals: Yes  Mode of Transportation: Set Designer  Occupation: Full time employment  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 [employer] Dominance: right     EXAMINATION OF PERFORMANCE DEFICITS:    Cognitive/Behavioral Status:    Orientation  Overall Orientation Status: Within Normal Limits  Orientation Level: Oriented X4  Cognition  Overall Cognitive Status: WNL    Hearing:   Hearing  Hearing: Within functional limits    Vision/Perceptual:          Vision  Vision: Impaired  Vision Exceptions: Wears glasses at all times  Perception  Overall Perceptual Status: WFL    Range of Motion:   AROM: Within functional limits  PROM: Within functional limits      Strength:  Strength: Within functional limits (BUEs and LEs 5/5)  Coordination:  Coordination: Within functional limits            Tone & Sensation:   Tone: Normal  Sensation: Intact      Functional Mobility and Transfers for ADLs:  Bed Mobility:     Bed Mobility Training  Bed Mobility Training: Yes  Overall Level of Assistance: Independent  Rolling: Independent  Supine to Sit: Independent  Sit to Supine: Independent  Scooting: Independent    Transfers:     Art Therapist: Yes  Overall Level of Assistance: Stand-by assistance  Interventions: Safety awareness training;Verbal cues  Sit to Stand: Stand-by assistance  Stand to Sit: Stand-by assistance  Stand Pivot Transfers: Stand-by assistance  Bed to Chair: Stand-by assistance  Toilet Transfer: Stand-by assistance                                    Balance:      Balance  Sitting: Intact  Standing: Intact      ADL Assessment:   Feeding: Independent     Grooming: Stand by assistance  Grooming Skilled Clinical Factors: standing at sink    UE Bathing: Modified independent      LE Bathing: Stand by assistance     UE Dressing: Modified independent        LE Dressing: Stand by assistance     Toileting: Stand by assistance     Functional Mobility: Stand by assistance  Functional Mobility Skilled Clinical Factors: no AD- mild impulsivity          ADL Intervention and task modifications:  Intervention/Education specific to: Stroke diagnoses    Patient was educated regarding her deficit(s) of HA as this relates to her diagnosis of SDH.  She demonstrated  good  understanding as evidenced by awareness of situation.    Patient and/or family was verbally and visually educated on the BE FAST acronym for signs/symptoms of CVA and TIA.  All questions answered with patient indicating good  understanding.     Fugl-Meyer Assessment of Motor Recovery after Stroke:     Reflex Activity  Flexors/Biceps/Fingers: Can be elicited  Extensors/Triceps: Can be elicited  Reflex Subtotal: 4    Volitional Movement Within Synergies  Shoulder Retraction: Full  Shoulder Elevation: Full  Shoulder Abduction (90 degrees): Full  Shoulder External Rotation: Full  Elbow Flexion: Full  Forearm Supination: Full  Shoulder Adduction/Internal Rotation: Full  Elbow Extension: Full  Forearm Pronation: Full  Subtotal: 18    Volitional Movement Mixing Synergies  Hand to Lumbar Spine: Full  Shoulder Flexion (0-90 degrees): Full  Pronation-Supination: Full  Subtotal: 6    Volitional Movement With Little or No Synergy  Shoulder Abduction (0-90 degrees): Full  Shoulder Flexion (90-180 degrees): Full  Pronation/Supination: Full  Subtotal: 6    Normal Reflex Activity  Biceps, Triceps, Finger Flexors: Full  Subtotal: 2    Upper Extremity Total   Upper Extremity Total: 36    Wrist  Stability at 15 Degree  Dorsiflexion: Full  Repeated Dorsiflexion/ Volar Flexion: Full  Stability at 15 Degree Dorsiflexion: Full  Repeated Dorsiflexion/ Volar Flexion: Full  Circumduction: Full  Wrist Total: 10    Hand  Mass Flexion: Full  Mass Extension: Full  Grasp A: Full  Grasp B: Full  Grasp C: Full  Grasp D: Full  Grasp E: Full  Hand Total: 14    Coordination/Speed  Tremor: None  Dysmetria: None  Time: <1s  Coordination/Speed Total: 6    Total A-D  Total A-D (Motor Function): 66         This is a reliable/valid measure of arm function after a neurological event. It has established value to characterize functional status and for measuring spontaneous and therapy-induced recovery; tests proximal and distal motor functions. Fugl-Meyer Assessment - UE scores recorded between five and 30 days post neurologic event can be used to predict UE recovery at six months post neurologic event.  Severe = 0-21 points   Moderately Severe = 22-33 points   Moderate = 34-47 points   Mild = 48-66 points  Cleatus MYRTIS Elige FREDRIK Patricia, D., Divine, G., & Feussner, J. (1992). Measurement of motor recovery after stroke: Outcome assessment and sample size requirements. Stroke, 23, pp. [phone].   --------------------------------------------------------------------------------------------------------------------------------------------------------------------  MCID:  Stroke:   Jackquelyn et al, 2001; n = 171; mean age 74 (11) years; assessed within 52 (12) days of stroke, Acute Stroke)  FMA Motor Scores from Admission to Discharge   10 point increase in FMA Upper Extremity = 1.5 change in discharge FIM   10 point increase in FMA Lower Extremity = 1.9 change in discharge FIM  MDC:   Stroke:   Cyndi et al, 2008, n = 14, mean age = 59.9 (14.6) years, assessed on average 14 (6.5) months post stroke, Chronic Stroke)   FMA = 5.2 points for the Upper Extremity portion of the assessment       [employer] AM-PACTM 6 Clicks                                                        Daily Activity Inpatient Short Form  AM-PAC Daily Activity - Inpatient   How much help is needed for putting on and taking off regular lower body clothing?: A Little  How much help is needed for bathing (which includes washing, rinsing, drying)?: A Little  How much help is needed for toileting (which includes using toilet, bedpan, or urinal)?: A Little  How much help is needed for putting on and taking off regular upper body clothing?: None  How much help is needed for taking care of personal grooming?: None  How much help for eating meals?: None  AM-PAC Inpatient Daily Activity Raw Score: 21  AM-PAC Inpatient ADL T-Scale Score : 44.27  ADL Inpatient CMS 0-100% Score: 32.79  ADL Inpatient CMS G-Code Modifier : CJ     Interpretation of Tool:  Represents clinically-significant functional categories (i.e. Activities of daily living).  Cutoff score 39.4 (19) correlates to a good likelihood of discharging home versus a facility  Diane U. Aneta, Ronal Broody, Vinoth K. Ranganathan, Sandra D. Passek, Gilmore RAMAN. Waldemar Dale EMERSON Aneta, AM-PAC "6-Clicks" Functional Assessment Scores Predict Acute Care Hospital Discharge Destination, Physical Therapy, Volume 94, Issue 9, 02 October 2012, Pages [phone], [email or web address]       Pain Rating:  8/10   Pain Intervention(s):   nursing notified and addressing and repositioning    Activity Tolerance:   Good    After treatment:   Patient left in no apparent distress sitting up in chair, Call bell within reach, and Caregiver / family present    COMMUNICATION/EDUCATION:   The patient's plan of care was discussed with: physical therapist and registered nurse    Patient  Education  Education Given To: Patient;Family  Education Provided: Role of Therapy;Plan of Care;ADL Training And Development Officer;Fall Prevention Strategies  Education Method: Demonstration;Verbal  Barriers to Learning: None  Education Outcome:  Verbalized understanding;Demonstrated understanding    Thank you for this referral.  Tinnie Franchot Sharps, OT  Minutes: 25    Occupational Therapy Evaluation Charge Determination   History Examination Decision-Making   LOW Complexity : Brief history review  LOW Complexity: 1-3 Performance deficits relating to physical, cognitive, or psychosocial skills that result in activity limitations and/or participation restrictions LOW Complexity: No comorbidities that affect functional and  no verbal  or physical assist needed to complete eval tasks   Based on the above components, the patient evaluation is determined to be of the following complexity level: Low

## 2022-11-02 NOTE — Progress Notes (Signed)
 Neurointerventional Surgery Progress Note      Admit Date: 11/01/2022   LOS: 1 day      Daily Progress Note: 11/02/2022    Assessment and Plan    #Acute Non Traumatic SAH, DSA negative for vascular source. HH 2, MF 2   - Diagnostic cerebral angiogram on 11/01/22 negative for vascular source, plan to repeat DSA in 1 week               - Day 1/21 of Nimotop  to prevent delayed cerebral ischemia              - Goal euvolemia. Fluid management per Intensivist.               - Strict I&O              - Echo w/EF 60-65 %, LV size normal, LA size normal               - Q 1 neuro checks              - TCDs daily, todays TCD with no evidence of vasospasm               - SBP goal 100-160              - PT/OT/SLP evals              - Assess NICOM q shift for fluid responsiveness    HPI      Christina Stevens is a 42 y.o.  White (non-Hispanic)  female with a pmh of PCOS, peripheral neuropathy, and ADHD who presented to [facility] ED today reporting a severe headache x6 days with slurred speech and stating she passed out today.  A Code Stroke was called and a stat CT head was obtained which showed acute SAH. CTA head/neck was then obtained which was negative for a vascular source. BP noted to be 179/102 on arrival to ED. NSGY was consulted and recommended NIS consult. Pt was then transferred to [facility] ICU for higher level of care. Pt wife at bedside reports pt takes adderall for ADHD, last took this yesterday. She reports the pt has a prescription for medical marijuana and smokes marijuana 3 times per day every single day. She denies pt using any street drugs and she states the pt does not drink any alcohol.      Pt follows with [facility], was last seen 09/27/22. Per notes, pt has had left sided weakness since 2019 peripheral neuropathy, foot drop, balance and gait disturbance, and memory issues that started after a prolonged hospitalization for sepsis. She underwent EMG, MRI brain, C-spine, T-spine, lumbar spine as part of the work up.  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    Subjective      No acute events overnight. Neurological examination is stable.     Allergies   Allergen Reactions    Decadron [Dexamethasone] Swelling and Anxiety    Adhesive Tape Rash    Chlorhexidine Itching    Oxycodone  Swelling    Oxycodone -Acetaminophen  Itching     Past Medical History:   Diagnosis Date    ADHD     Epstein Barr infection     MRSA (methicillin resistant staph aureus) culture positive     MS (congenital mitral stenosis)     PCOS (polycystic ovarian syndrome)      No family history on file.  Social History     Tobacco Use    Smoking status: Every Day    Smokeless tobacco: Never   Substance Use Topics    Alcohol use: No      Prior to Admission Medications   Prescriptions Last Dose Informant Patient Reported? Taking?   acetaminophen  (TYLENOL ) 325 MG tablet   Yes No   Sig: Take 2 tablets by mouth every 6 hours as needed   amphetamine-dextroamphetamine (ADDERALL) 20 MG tablet   Yes No   Sig: Take 2 tablets by mouth daily.   atorvastatin  (LIPITOR ) 20 MG tablet   Yes No   Sig: Take 1 tablet by mouth daily   ipratropium-albuterol (DUONEB) 0.5-2.5 (3) MG/3ML SOLN nebulizer solution   Yes No   Sig: Inhale 3 mLs into the lungs every 4 hours as needed   Patient not taking: Reported on 03/17/2022   pantoprazole  (PROTONIX ) 20 MG tablet   Yes No   Sig: Take 1 tablet by mouth daily      Facility-Administered Medications: None       Objective:   Vital signs  Temp (24hrs), Avg:98.6 F (37 C), Min:98.1 F (36.7 C), Max:98.9 F (37.2 C)   10/01 0701 - 10/01 1900  In: -   Out: 200 [Urine:200]  09/29 1901 - 10/01 0700  In: 2390.4 [P.O.:840; I.V.:1550.4]  Out: 1750 [Urine:1750]  BP 116/76   Pulse 53   Temp 98.6 F (37 C) (Oral)   Resp 15   Wt 71.8 kg (158 lb 4.6 oz)   SpO2 99%   BMI 29.91 kg/m        Vitals:    11/02/22 0500 11/02/22 0541 11/02/22 0600 11/02/22 0700   BP: 112/66  114/74 116/76    Pulse: 60  55 53   Resp: 14  14 15    Temp: 98.6 F (37 C)      TempSrc: Oral      SpO2: 91%  97% 99%   Weight:  71.8 kg (158 lb 4.6 oz)          Physical Exam:  Constitutional: Middle aged female resting in bed   Respiratory:Unlabored respirations     Neurological:  MS: AAOx4, following commands   Speech: Fluent, repetition and naming intact   CN: EOMI, VFF, Face symmetric   Motor: Normal bulk and tone. Drift to LUE and LLE. Full strength throughout to BUE and BLE.   Sensation: Intact to light touch throughout   Coordination: FNF intact  Gait: Deferred     Labs:  Lab Results   Component Value Date/Time    WBC 15.5 11/02/2022 02:35 AM    HGB 13.2 11/02/2022 02:35 AM    HCT 37.0 11/02/2022 02:35 AM    PLT 121 11/02/2022 02:35 AM    MCV 93.4 11/02/2022 02:35 AM     Lab Results   Component Value Date/Time    NA 136 11/02/2022 05:02 AM    K 3.8 11/02/2022 05:02 AM    CL 106 11/02/2022 05:02 AM    CO2 23 11/02/2022 05:02 AM    BUN 6 11/02/2022 05:02 AM    GFRAA >60 09/06/2017 06:55 PM     Imaging:  CT head: Acute SAH  CTA head/neck: Negative for vascular source of hemorrhage.    TCDs:  11/02/22: Negative for vasospasm     Charmaine GORMAN Amass, APRN - CNP  Neurointerventional Surgery Nurse Practitioner     Patient discussed with attending physician Dr. Massie

## 2022-11-02 NOTE — Progress Notes (Addendum)
 Neurocritical Care Brief Progress Note:  42 y.o F with significant pmh for ADHD, PCOS, Peripheral neuropathy, Pick's disease per patient, and tobacco use transferred from [facility] to [facility] on 11/01/22 with acute SAH. CTA h-n was negative for a vascular source. Taken for dsa by Dr. Massie which is negative for vascular source with plan to follow up imaging in 7 days. Right groin/radial arteriotomy site is C/D/I without oozing, hematoma, tenderness, redness, bruising. + distal pulses.     02/10/22~TCD's with no vsp. proximal inferior vena cava shows no collapse. TTE with EF of 60 - 65%. NWM    Pt seen with friend and mother at the bedside. Initially, the patient was excited to see this NP and was very pleasant requesting for hugs however, she broke up in tears when this NP assess her pain and stated she has a terrible headache at 10/10. Pt also vomited during exams. Pt stated Dilaudid  and morphine  usually helps her pain. Discussed with pt the need to avoid narcotics as this can cause rebound headaches. Pt also requested for Toradol  however, I explained the risk of bleeding with Toradol . Pt also mentioned neck tension which I recommended lidocaine  patch and patient seems amenable to that. Lidocaine  patch ordered. Pt already on scheduled tylenol  which was started today.     Pt neuro exams is stable. LUE weakness seems stronger as pt was able to squeeze tight when she hugged this NP. Labs reviewed~N+ 138, K+ 3.6, Mg 2.4, phos 3.1. Pt with leukocytosis of 15 from 12. No fever noted. Continue to  monitor. On NS at 75cc. I/O is +355 euvolemia  Review of Systems:   Admits headache, chronic left-sided weakness with decrease sensation to LLE which pt relates to post surgery to the LLE. Denied any other issues, no visual issues, no ear/nose, throat problems; no coughing or wheezing or shortness of breath, No chest pain or orthopnea, no abdominal pain, nausea or vomiting, No pain in the body or extremities, no psychiatric,  neurological, endocrine, hematological or cardiac complaints except as noted above.      Physical Exam:   Blood pressure (!) 150/102, pulse 56, temperature 98 F (36.7 C), temperature source Oral, resp. rate 22, height 1.549 m (5' 1), weight 71.7 kg (158 lb), SpO2 100%. Net IO Since Admission: 995.73 mL [11/02/22 2002]    GEN: Cooperative, Calm, Well developed and nourished patient in NAD  HEENT: Normocephalic. Non-icter, no congestion, alopecia   Lungs:  Diminished bilaterally ant, non-labored breathing, RA  Cardiac: S1,S2, normal rate and rhythm, with no murmurs. no gallops  Abdomen: hypoactive bowel sounds, no distention, soft, non-tender  Extremities: 2+ Radial pulses, no clubbing, cyanosis, or edema  Skin: no rashes or lesions noted; Skin color appropriate and warm for ethnicity.       NEURO:  Mental status: Oriented to time, situation, place and person. Able to follow commands appropriately  Cranial Nerves: Attention and fund of knowledge is appropriate/intact. Speech/Language is intact/fluent. Normal recent and remote memory. EOMI, PERRL, 3mm, no nystagmus, no ptosis. No dysarthria or aphasia. Full facial strength, no asymmetry. Hearing intact bilaterally. Tongue protrudes to midline, palate elevates symmetrically. Shrug Shoulders B/L  Motor: Normal bulk and tone, 5/5 strength to BUE extremities proximally and distally; weakness and drift to LLE 4/5 strength, right foot drop. No involuntary movements.  Coordination: FTN and HTS testing  Reflexes: +2 throughout, down going toes bilaterally   Sensation: Intact to facial and BU extremities to light touch except decreased to LLE.  Gait: Deferred        Continue current plan per NIS.      Case d/w: C. Kathline, NP, intensivist, primary nurse, patient, and friend with mom at the bedside       spent 35 minutes of time involved in chart review, lab review, imaging review       Elyn Bruins, AGAC-NP  Neurocritical Care Nurse Practitioner  [phone]

## 2022-11-02 NOTE — Plan of Care (Addendum)
 Notified by nursing that patient is crying and has excruciating headache.     Neurological exam unchanged. Patient states she has 10/10 ha.     CT Head ordered, reviewed, SAH is stable, diminished compared to prior CT Head.     Tylenol  650 mg Q4 PRN increased to 1000 mg and scheduled Q8 for pain control.     Addendum: CT Head also notable for right frontal hypodensity, in addition to diminished [facility]

## 2022-11-02 NOTE — Progress Notes (Signed)
 Spiritual Health History and Assessment/Progress Note  [facility]    Initial Encounter,  ,  ,      Name: Christina Stevens MRN: [id number]    Age: 42 y.o.     Sex: female   Language: English   Religion: Catholic   Subarachnoid hemorrhage (HCC)     Date: 11/02/2022            Total Time Calculated: 15 min              Spiritual Assessment began in [facility] 7S1 INTENSIVE CARE        Referral/Consult From: Rounding   Encounter Overview/Reason: Initial Encounter  Service Provided For: Family    Faith, Belief, Meaning:   Patient unable to assess at this time  Family/Friends have beliefs or practices that help with coping during difficult times      Importance and Influence:  Patient unable to assess at this time  Family/Friends have no beliefs influential to healthcare decision-making identified during this visit    Community:    Family/Friends feel well-supported. Support system includes: Friends and Extended family    Assessment and Plan of Care:     Family/Friends Interventions include: Facilitated expression of thoughts and feelings, Explored spiritual coping/struggle/distress, Engaged in theological reflection, and Affirmed coping skills/support systems    Patient Plan of Care: Spiritual Care available upon further referral  Family/Friends Plan of Care: Spiritual Care available upon further referral    Electronically signed by Pearlene Marcus, Chaplain Resident on 11/02/2022 at 3:07 PM

## 2022-11-02 NOTE — Plan of Care (Signed)
 Problem: Physical Therapy - Adult  Goal: By Discharge: Performs mobility at highest level of function for planned discharge setting.  See evaluation for individualized goals.  Description: FUNCTIONAL STATUS PRIOR TO ADMISSION: Patient was independent and active without use of DME. Worked full time as an airline pilot.     HOME SUPPORT PRIOR TO ADMISSION: The patient lived with her wife and two teenage children but did not require assistance.    Physical Therapy Goals  Initiated 11/02/2022  1.  Patient will move from supine to sit and sit to supine and roll side to side in bed with independence within 7 day(s).    2.  Patient will perform sit to stand with independence within 7 day(s).  3.  Patient will transfer from bed to chair and chair to bed with independence using the least restrictive device within 7 day(s).  4.  Patient will ambulate with independence for 500 feet with the least restrictive device within 7 day(s).   5.  Patient will ascend/descend 14 stairs with one handrail(s) with supervision/set-up within 7 day(s).  6.  Patient will improve Berg Balance score by 4 points within 7 days.   Outcome: Progressing     PHYSICAL THERAPY EVALUATION    Patient: Christina Stevens (42 y.o. female)  Date: 11/02/2022  Primary Diagnosis: Subarachnoid bleed (HCC) [I60.9]       Precautions: Restrictions/Precautions:  (SBP 100-160)                      ASSESSMENT :   DEFICITS/IMPAIRMENTS:   The patient is limited by decreased activity tolerance, attention/concentration, balance, increased pain levels. This is a 42 year old female who presented to hospital with intractable headache and slurred speech. Found to have SAH. Patient received in bed agreeable to evaluation. Strength, coordination, sensation is equal and intact. Visual tracking normal with glasses donned. Overall required mod I for bed mobility, SBA for transfers and gait training. During gait, patient demonstrates slightly slower, shuffling pace with variable gait  path. No overt LOB noted. Tolerated FGA testing, indicating mild to moderate risk for falls (21/30). She is mildly impulsive with movements, mobility however wife who is present states this is her baseline. Chart and patient endorse L sided weakness (from 2019) following a prolonged hospitalization however no difference in MMT is appreciated today. No observed foot drop during gait, shuffling pattern after prolonged gait. Returned to room, BP stable. Reviewed BEFAST. Patient will benefit from skilled intervention to address the above impairments.       11/02/22 0942 11/02/22 0948 11/02/22 0950   Vitals   Pulse 66 65 73   BP 121/73 99/66 112/79   MAP (Calculated) 89 77 90   BP Location Left upper arm Left upper arm Left upper arm   BP Method Automatic Automatic Automatic   Patient Position Semi fowlers Sitting  (EOB) Standing   SpO2 97 % 99 % 99 %   O2 Device None (Room air) None (Room air) None (Room air)      11/02/22 0959   Vitals   Pulse 78   BP 109/70   MAP (Calculated) 83   BP Location Left upper arm   BP Method Automatic   Patient Position Sitting  (after gait training)   SpO2 99 %   O2 Device None (Room air)       Functional Outcome Measure:  The patient scored 50/56 on the BERG outcome measure which is indicative of low risk for falls.  PLAN :  Recommendations and Planned Interventions:   bed mobility training, transfer training, gait training, therapeutic exercises, neuromuscular re-education, patient and family training/education, and therapeutic activities    Frequency/Duration: Patient will be followed by physical therapy to address goals, PT Plan of Care: 5 times/week to address goals.    Recommend with staff: therapy recommendations for staff: Recommend mobility with staff assist x1 using gait belt.    Recommend for next PT session: stair training and further progression of gait with existing device    Recommendation for discharge: (in order for the patient to meet his/her long term goals):    Outpatient physical therapy for balance/gait vs none pending progress    Other factors to consider for discharge: concern for safely navigating or managing the home environment    IF patient discharges home will need the following DME: none                SUBJECTIVE:   Patient stated "I am so glad to get out of this room."    OBJECTIVE DATA SUMMARY:       Past Medical History:   Diagnosis Date    ADHD     Elbert Shine infection     MRSA (methicillin resistant staph aureus) culture positive     MS (congenital mitral stenosis)     PCOS (polycystic ovarian syndrome)      Past Surgical History:   Procedure Laterality Date    APPENDECTOMY      COLONOSCOPY N/A 03/17/2022    COLONOSCOPY W/ ENDOSCOPIC MUCOSAL RESECTION performed by Danial Bias, MD at [facility] ENDOSCOPY    IR CHOLECYSTOSTOMY PERCUTANEOUS COMPLETE         Home Situation:  Social/Functional History  Lives With: Spouse (wife)  Type of Home: House  Home Layout: Two level, Bed/Bath upstairs  Home Access: Stairs to enter with rails  Entrance Stairs - Number of Steps: 5  Entrance Stairs - Rails: Both  Bathroom Shower/Tub: Cabin Crew: None  Home Equipment: None  Has the patient had two or more falls in the past year or any fall with injury in the past year?: No  ADL Assistance: Psychologist, Sport And Exercise Assistance: Independent  Ambulation Assistance: Independent  Transfer Assistance: Surveyor, Minerals: Yes  Mode of Transportation: Set Designer  Occupation: Full time employment  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 [employer] Status:  Orientation  Overall Orientation Status: Within Normal Limits  Orientation Level: Oriented X4  Cognition  Overall Cognitive Status:  (slight impulsivity noted, per wife this is baseline)    Hearing:   Hearing  Hearing: Within functional limits    Vision/Perceptual:          Vision  Vision: Impaired  Vision Exceptions: Wears glasses at all times  Perception  Overall Perceptual Status:  WFL    Strength:    Strength: Within functional limits    Tone & Sensation:   Tone: Normal  Sensation: Intact    Coordination:  Coordination: Within functional limits    Range Of Motion:  AROM: Within functional limits  PROM: Within functional limits    Functional Mobility:  Bed Mobility:     Bed Mobility Training  Bed Mobility Training: Yes  Overall Level of Assistance: Independent  Rolling: Independent  Supine to Sit: Independent  Sit to Supine: Independent  Scooting: Independent  Transfers:     Art Therapist: Yes  Overall Level of Assistance: Stand-by assistance  Interventions:  Safety awareness training;Verbal cues  Sit to Stand: Stand-by assistance  Stand to Sit: Stand-by assistance  Stand Pivot Transfers: Stand-by assistance  Bed to Chair: Stand-by assistance  Toilet Transfer: Stand-by assistance  Balance:      Balance  Sitting: Intact  Standing: Intact  Ambulation/Gait Training:     Gait  Gait Training: Yes  Overall Level of Assistance: Stand-by assistance  Distance (ft): 150 Feet  Assistive Device: Gait belt  Interventions: Verbal cues  Base of Support: Center of gravity altered  Speed/Cadence: Pace decreased (< 100 feet/min)  Step Length: Right shortened;Left shortened  Gait Abnormalities: Shuffling gait;Decreased step clearance                                                                                                                                                                                                                                       Intervention/Education specific to: Stroke diagnoses    Patient was educated regarding her deficit(s) of headache as this relates to her diagnosis of SAH.  She demonstrated fair  understanding as evidenced by discussion of deficits.    Patient and/or family was verbally and visually educated on the BE FAST acronym for signs/symptoms of CVA and TIA.  All questions answered with patient indicating good  understanding.     Berg Balance  Test:    Lars Balance Scale  1. Sitting to Standing: Able to stand without using hands and stabilize independently  2. Standing Unsupported: Able to stand safely for 2 minutes  3. Sitting with Back Unsupported but Feet Supported on Floor or on a Stool: Able to sit safely and securely for 2 minutes  4. Standing to Sitting: Sits safely with minimal use of hands  5.  Transfers: Able to transfer safely with minor use of hands  6. Standing Unsupported with Eyes Closed: Able to stand 10 seconds safely  7. Standing Unsupported with Feet Together: Able to place feet together independently and stand 1 minute safely  8. Reach Forward with Outstretched Arm While Standing: Can reach forward confidently 25 cm (10 inches)  9. Pick Up Object from Floor from a Standing Position: Able to pick up slipper safely and easily  10. Turning to Look Behind Over Left and Right Shoulders While Standing: Looks behind from both sides and weight shifts well  11. Turn 360 Degrees: Able to turn 360 degrees safely one side only 4 seconds or  less  12. Place Alternate Foot on Step or Stool While Standing Unsupported: Able to stand independently and complete 8 steps in greater than 20 seconds  13. Standing Unsupported One Foot in Front: Able to take small step independently and hold 30 seconds  14. Standing on One Leg: Able to lift leg independently and hold greater than or equal to 3 seconds  Berg Balance Score: 50         56=Maximum possible score;   0-20=High fall risk  21-40=Moderate fall risk   41-56=Low fall risk                                                                                                                                                                                                                                                                                                                                                                                                                                                          Functional Gait Assessment:    Functional Gait Assessment  Gait level surface: Normal  Change in gait speed: Mild Impairment  Gait with horizontal head turns: Mild Impairment  Gait with vertical head turns: Mild Impairment  Gait and pivot turn: Normal  Step over obstacle: Mild Impairment  Gait with narrow base of support: Moderate Impairment  Gait with eyes closed: Mild Impairment  Ambulating Backwards: Mild Impairment  Steps: Mild Impairment  FGA Score: 21       Maximum Score 30    22/30 classifies fall risk in older adults and predicts unexplained falls in community-dwelling older adults  Age: Normal score:   Up to 60 >27/30   60-80 >24/30   Over 80 >19/30   Willye CHARM Sor, N. Functional Gait Assessment: concurrent discriminative, and predictive validity in community-dwelling older adults. Physical Therapy 90 (5) 2010.                                                                                                                                                                                                                                                                                                                                                                                                                                               Pain Rating:  8/10   Pain Intervention(s):   nursing notified and addressing and patient medicated for pain prior to session    Activity Tolerance:   Fair  and requires rest breaks    After treatment:   Patient left in no apparent distress sitting up in chair, Call bell within reach, and Caregiver / family present    COMMUNICATION/EDUCATION:   The patient's plan of care was discussed with: occupational therapist and registered nurse    Patient Education  Education Given To:  Patient;Family  Education Provided: Role of Therapy;Plan of Automotive Engineer;Fall Prevention Strategies  Education Provided Comments: BEFAST  Education Method:  Verbal  Barriers to Learning: None  Education Outcome: Continued education needed    Thank you for this referral.  Orren Pitt, PT, DPT  Minutes: 28      Physical Therapy Evaluation Charge Determination   History Examination Presentation Decision-Making   MEDIUM  Complexity : 1-2 comorbidities / personal factors will impact the outcome/ POC  MEDIUM Complexity : 3 Standardized tests and measures addressin body structure, function, activity limitation and / or participation in recreation  MEDIUM Complexity : Evolving with changing characteristics  Berg Balance Test  LOW    Based on the above components, the patient evaluation is determined to be of the following complexity level: Medium

## 2022-11-02 NOTE — Care Coordination (Addendum)
 Care Management Initial Assessment       RUR:4%  Readmission? No  1st IM letter given? N/A  1st Tricare letter given: N/A    Patient is currently in ICU.    Transition of Care Plan:    RUR: 4%  Prior Level of Functioning: independent  Disposition: Home with family assistance  PT evaluation 11/02/22 recommended outpatient PT.  OT evaluation 11/02/22 : no skilled needs.  SLP evaluation 11/02/22 recommended regular diet.  If SNF or IPR: Date FOC offered: N/A  Date FOC received:   Accepting facility:   Date authorization started with reference number:   Date authorization received and expires:   Follow up appointments:   DME needed: None  Transportation at discharge: family  IM/IMM Medicare/Tricare letter given: N/A  Is patient a Veteran and connected with VA?    If yes, was [employer] transfer form completed and VA notified?   Caregiver Contact: spouse Patricie Geeslin [phone]  Discharge Caregiver contacted prior to discharge?   Care Conference needed?   Barriers to discharge:     Reason for Admission:    [facility]    Consults:       neurosurgery             Plan for utilizing home health:  None        PCP: First and Last name:  Coggin, Ozell RODES, APRN - NP    Current Advanced Directive/Advance Care Plan: Full Code                   CM met with patient and her father. Patient lives her wife and 2 children ages 43 and 52.  Patient lives in a  2 story house. Local family support includes father, brother. Patient was ambulatory prior to admission. Patient confirmed PCP, health insurance, and prescription coverage.   Previous home health :None  Previous IPR/ SNF rehab :None  DME :None     11/02/22 1516   Service Assessment   Patient Orientation Alert and Oriented   Cognition Alert   History Provided By Patient   Primary Caregiver Self   Accompanied By/Relationship father Allissa Albright   Support Systems Spouse/Significant Other   Patient's Healthcare Decision Maker is: Armed Forces Operational Officer Next of Kin   PCP Verified by CM Yes   Last Visit to PCP  Within last 3 months   Prior Functional Level Independent in ADLs/IADLs   Current Functional Level Independent in ADLs/IADLs   Can patient return to prior living arrangement Yes   Ability to make needs known: Fair   Family able to assist with home care needs: Yes   Would you like for me to discuss the discharge plan with any other family members/significant others, and if so, who? No   Financial Resources None   [employer] None

## 2022-11-02 NOTE — Plan of Care (Signed)
 Problem: Discharge Planning  Goal: Discharge to home or other facility with appropriate resources  Outcome: Progressing     Problem: Pain  Goal: Verbalizes/displays adequate comfort level or baseline comfort level  Outcome: Progressing  Flowsheets (Taken 11/02/2022 0424)  Verbalizes/displays adequate comfort level or baseline comfort level:   Encourage patient to monitor pain and request assistance   Assess pain using appropriate pain scale   Administer analgesics based on type and severity of pain and evaluate response   Implement non-pharmacological measures as appropriate and evaluate response   Consider cultural and social influences on pain and pain management   Notify Licensed Independent Practitioner if interventions unsuccessful or patient reports new pain     Problem: Safety - Adult  Goal: Free from fall injury  Outcome: Progressing

## 2022-11-02 NOTE — Progress Notes (Signed)
 Orders received, chart reviewed and patient evaluated by physical therapy. Pending progression with skilled acute physical therapy, recommend:    Outpatient physical therapy for balance/gait pending progress    Recommend with nursing OOB to chair 3x/day and walking daily with supervision/Stand by assist. Thank you for completing as able in order to maintain patient strength, endurance and independence.     Full evaluation to follow.      Thank you for your consideration,    Orren Pitt, PT, DPT

## 2022-11-02 NOTE — Progress Notes (Signed)
 CRITICAL CARE NOTE      Name: Christina Stevens   DOB: 02-26-1980   MRN: [id number]   Date: 11/02/2022      REASON FOR ICU ADMISSION:  [facility]     BRIEF PATIENT SUMMARY AND HOSPITAL COURSE   Christina Stevens is a 42yoF w/ PCOS, neuropathy, Picks dz, transferred for acute SAH with no obvious vascular source.    9/30: Diagnostic angiogram with no vascular source. Admit to ICU.     10/1: Start Nimotop . IVF. Q1h neuro checks. Working with PT/OT.      COMPREHENSIVE ASSESSMENT & PLAN     SAH  Unknown etiology, no vascular source on angiogram, ddx vasculitis vs RCVS; known hx of functional neurologic disorder   - Repeat HCT stable   - Plan for repeat angiogram next week   - Maintenance fluids, goal euvolemia   - Nimotop  Day 1/21  - Q1 neuro checks   - Daily TCDs  - SBP 100-160     Headache   In setting of SAH   - Reporting 10/10 head pain, no neuro changes on exam   - Scheduled and PRN tylenol    - Add fioricet  if needed       ICU DAILY CHECKLIST     Code Status: Full Code    DVT Prophylaxis: Held, SAH  T/L/D: Tubes: None  Lines: Peripheral IV  Drains: None  SUP: PPI  Diet: ADULT DIET; Regular   Activity Level: PT/OT  ABCDEF Bundle/Checklist Completed: Yes  Disposition: Stay in ICU  Multidisciplinary Rounds Completed:  Yes  Goals of Care Discussion/Palliative: No  Patient/Family Updated: Yes    SUBJECTIVE   Patient found walking the halls with PT/OT, in good spirits. Reports improvement from yesterday, though complaining of severe headache. Otherwise denies dizziness, lightheadedness, dyspnea, abd pain.        OBJECTIVE   Physical Exam  Constitutional:       General: She is not in acute distress.  Eyes:      Pupils: Pupils are equal, round, and reactive to light.   Cardiovascular:      Rate and Rhythm: Normal rate and regular rhythm.      Pulses: Normal pulses.      Heart sounds: Normal heart sounds. No murmur heard.  Pulmonary:      Effort: Pulmonary effort is normal. No respiratory distress.      Breath sounds: Normal breath  sounds.   Abdominal:      General: Abdomen is flat. Bowel sounds are normal.      Palpations: Abdomen is soft.   Skin:     General: Skin is warm and dry.   Neurological:      General: No focal deficit present.      Mental Status: She is alert and oriented to person, place, and time.       Labs and Data: Reviewed 11/02/22  Recent Labs     11/01/22  1320 11/02/22  0235   WBC 12.1* 15.5*   RBC 4.79 3.96   HGB 15.3 13.2   HCT 43.9 37.0   MCV 91.6 93.4   RDW 11.6 12.1   PLT 345 121*     Recent Labs     11/01/22  1320 11/02/22  0502   NA 138 136   K 4.1 3.8   CL 108 106   CO2 25 23   BUN 9 6   CREATININE 0.82 0.80   GLUCOSE 113* 115*   PHOS  --  3.1   MG  --  2.4     Recent Labs     11/01/22  1320 11/02/22  0502   AST 16 19   ALT 17 16   BILITOT 0.3 0.4   ALKPHOS 70 70     Recent Labs     11/01/22  1320   PROTIME 9.8   INR 0.9     Medications: Reviewed 11/02/22  Imaging: Reviewed 11/02/22   Most Recent XR  XR CERVICAL SPINE (4-5 VIEWS) 04/12/2022    Narrative  AP, Lateral, Flexion, Extension.    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    COMPARISON: CT head performed the same day.SABRA    CONTRAST: 100 mL of Isovue -370.    TECHNIQUE:  Unenhanced scout images were obtained to localize the volume for  acquisition.  Multislice helical axial CT angiography was performed from the  aortic arch to the top of the head during uneventful rapid bolus intravenous  contrast administration.   Coronal and sagittal reformations and 3D post  processing was performed.  CT dose reduction was achieved through use of a  standardized protocol tailored for this examination and automatic exposure  control for dose modulation.    FINDINGS:    DELAYED ENHANCEMENT HEAD  CT:  Subarachnoid hemorrhage as described previously. No delayed enhancement      CTA NECK:  Great vessels: Normal arch anatomy with the origins patent.  Right subclavian artery: Patent  Left subclavian artery: Patent  Right common carotid artery: Patent  Left common carotid artery: Patent  Cervical right internal carotid artery: Patent with no significant stenosis by  NASCET criteria.  Cervical left internal carotid artery: Patent with no significant stenosis by  NASCET criteria.  Right vertebral artery: Patent  Left vertebral artery: Patent  The lung apices are clear. The thyroid is homogeneous. No cervical  lymphadenopathy.    CTA HEAD:  Right cavernous internal carotid artery: Patent  Left cavernous internal carotid artery: Patent  Anterior cerebral arteries: Patent  Anterior communicating artery: Patent  Right middle cerebral artery: Patent  Left middle cerebral artery: Patent  Posterior communicating arteries: Patent  Posterior cerebral arteries: Patent  Basilar artery: Patent  Distal vertebral arteries: Patent  No evidence for intracranial aneurysm or hemodynamically significant stenosis.    Impression  No intracranial aneurysm or other focal vascular abnormality accounting for  subarachnoid hemorrhage.      Electronically signed by PAULINE BLANCH    Most Recent MRI  MRI BRAIN WO CONTRAST 04/23/2022    Narrative  INDICATION:   Cervicalgia; Radiculopathy, cervical region; Polyneuropathy,  unspecified; Cervical disc disorder with myelopathy, cervicothoracic region    EXAMINATION:  MRI BRAIN WO CONTRAST    COMPARISON: November 08, 2017    TECHNIQUE:  Multiplanar multisequence acquisition without contrast of the brain.      FINDINGS:    Ventricles:  Midline, no hydrocephalus.  Brain Parenchyma/Brainstem:  Normal for age. No acute infarction.  Intracranial Hemorrhage:  None.  Basal Cisterns:  Normal.  Flow Voids:  Normal.  Additional Comments: Partly visualized degenerative changes in the cervical  spine, see separate  cervical spine MRI report from the same day.    Impression  No significant abnormality or acute process.    Most Recent Echo  No results found for this or any previous visit.      BP 126/85   Pulse 55   Temp 98.8 F (37.1 C) (Oral)   Resp 17   Wt 71.8 kg (158 lb 4.6 oz)   SpO2 97%   BMI 29.91 kg/m      Temp (24hrs), Avg:98.6 F (37 C), Min:98.1 F (36.7 C), Max:98.9 F (37.2 C)           Intake/Output:     Intake/Output Summary (Last 24 hours) at 11/02/2022 0915  Last data filed at 11/02/2022 0800  Gross per 24 hour   Intake 2390.42 ml   Output 2150 ml   Net 240.42 ml       CRITICAL CARE DOCUMENTATION  I had a face to face encounter with the patient, reviewed and interpreted patient data including clinical events, labs, images, vital signs, I/O's, and examined patient.  I have discussed the case and the plan and management of the patient's care with the consulting services, the bedside nurses and the respiratory therapist.      NOTE OF PERSONAL INVOLVEMENT IN CARE   This patient has a high probability of imminent, clinically significant deterioration, which requires the highest level of preparedness to intervene urgently. I participated in the decision-making and personally managed or directed the management of the following life and organ supporting interventions that required my frequent assessment to treat or prevent imminent deterioration.    I personally spent 35 minutes of critical care time.  This is time spent at this critically ill patient's bedside actively involved in patient care as well as the coordination of care.  This does not include any procedural time which has been billed separately.    Delon JONETTA Neas, PA   Critical Care Medicine  [employer]

## 2022-11-02 NOTE — Progress Notes (Signed)
 0848: Fioricet  and reglan  given. See MAR. Patient is restless in pain. Attempting to dim lights completely, place cool washcloth over forehead, and closed patients door to decrease outdoor stimulus. Patient walked around room with this RN and was placed back in bed on monitor.     1128: Tylenol  given. See MAR. Patient is restless in pain. Offered above interventions. Patient declined.     1410: Fioricet  and reglan  given see MAR.     1500: Patient is continuously restless in pain requesting to speak to [facility] NP. Courtney NP at bedside to eval patient. Order for head CT placed by NP. Patient taken down to CT in wheelchair with RN on monitor.    1530: Massie MD and Charmaine NP at bedside. Patient is yelling at NP and MD.    1600: Patient yelling at RN stating she is in pain and requesting tylenol . This RN educated tylenol  is not available to be administered at this time.

## 2022-11-02 NOTE — Anesthesia Postprocedure Evaluation (Signed)
 Department of Anesthesiology  Postprocedure Note    Patient: Christina Stevens  MRN: [id number]  Birthdate: 02/08/1980  Date of evaluation: 11/02/2022    Procedure Summary       Date: 11/01/22 Room / Location: [facility] IR    Anesthesia Start: 1653 Anesthesia Stop: 1815    Procedure: IR ARCH UNI CAR CERV CEREBRAL Diagnosis: (sah)    Scheduled Providers: Celestia Selinda POUR, MD Responsible Provider: Shirlean Corean CROME, DO    Anesthesia Type: MAC ASA Status: 4 - Emergent            Anesthesia Type: MAC    Aldrete Phase I:      Aldrete Phase II:      Anesthesia Post Evaluation    Patient location during evaluation: PACU  Patient participation: complete - patient participated  Level of consciousness: awake and alert  Pain score: 0  Airway patency: patent  Nausea & Vomiting: no nausea and no vomiting  Cardiovascular status: blood pressure returned to baseline and hemodynamically stable  Respiratory status: acceptable and room air  Hydration status: euvolemic  Multimodal analgesia pain management approach  Pain management: adequate and satisfactory to patient    No notable events documented.

## 2022-11-02 NOTE — Progress Notes (Addendum)
 Speech LAnguage Pathology EVALUATION    Patient: Christina Stevens (42 y.o. female)  Date: 11/02/2022  Primary Diagnosis: Subarachnoid bleed (HCC) [I60.9]       Precautions:   (SBP 100-160)                  ASSESSMENT :  Patient seen for swallow evaluation s/p SAH of the basilar cistern superior to the midbrain. Patient presents with seemingly functional oropharyngeal phases of swallow with no overt s/s of aspiration nor overt difficulty appreciated with any trials. Patient does report that she is having some coughing/choking with thin liquids, particularly when she takes larger, sequential sips. Discussed with patient about the risk for aspiration in the setting of SAH, but that the location does not necessarily place her at an extremely high risk for aspiration (as it would if other areas of the brainstem were involved). Decision made to have patient continue baseline diet with close monitoring of tolerance. If any adverse effects noted, will hold PO and complete objective imaging but low suspicion that will be necessary. No cognitive or motor speech deficits appreciated at this time, and patient does not endorse any changes in these regards. Will continue to follow.     Patient will benefit from skilled intervention to address the above impairments.     PLAN :  Recommendations and Planned Interventions:  Diet: Regular and thin liquids  General aspiration precautions  Low threshold to complete imaging if any signs of adverse effects from PO intake         Acute SLP Services: SLP Plan of Care: 2 times/week. Patient's rehabilitation potential is considered to be Good.  Discharge Recommendations: Continue to assess pending progress     SUBJECTIVE:   Patient stated, "You all want me to be in a little bit of pain," SLP explained that that is not the intent but that pain medications are scheduled for her safety and to maximize rehabilitation.     OBJECTIVE:     Past Medical History:   Diagnosis Date    ADHD     Elbert Shine infection     MRSA (methicillin resistant staph aureus) culture positive     MS (congenital mitral stenosis)     PCOS (polycystic ovarian syndrome)      Past Surgical History:   Procedure Laterality Date    APPENDECTOMY      COLONOSCOPY N/A 03/17/2022    COLONOSCOPY W/ ENDOSCOPIC MUCOSAL RESECTION performed by Danial Bias, MD at [facility] ENDOSCOPY    IR CHOLECYSTOSTOMY PERCUTANEOUS COMPLETE       Prior Level of Function/Home Situation:   Social/Functional History  Lives With: Spouse (wife)  Type of Home: House  Home Layout: Two level, Bed/Bath upstairs  Home Access: Stairs to enter with rails  Entrance Stairs - Number of Steps: 5  Entrance Stairs - Rails: Both  Bathroom Shower/Tub: Cabin Crew: None  Home Equipment: None  Has the patient had two or more falls in the past year or any fall with injury in the past year?: No  ADL Assistance: Psychologist, Sport And Exercise Assistance: Independent  Ambulation Assistance: Independent  Transfer Assistance: Independent  Active Driver: Yes  Mode of Transportation: Car  Occupation: Full time employment  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 Trials:  PO Trials  Assessment Method(s): Observation  Vocal Quality: No Impairment  Consistency Presented: Thin;Regular  How Presented: Self-fed/presented  Bolus Acceptance: No impairment  Bolus Formation/Control: No impairment  Propulsion: No impairment  Oral Residue: None  Initiation of Swallow: No impairment  Laryngeal Elevation: Functional  Aspiration Signs/Symptoms: None  Pharyngeal Phase Characteristics: No  impairment, issues, or problems  Oral Phase  Oral Phase - Comment: WFL           Respiratory Status/Airway:  Room air                         Outcome Measure:  Functional Oral Intake Scale (FOIS): 7--Total oral diet with no restrictions    After treatment:   Call bell left within reach and Nursing notified    COMMUNICATION/EDUCATION:     The patient's plan of care including recommendations, planned interventions, and recommended diet changes were discussed with: Registered nurse    Patient/family have participated as able in goal setting and plan of care    Thank you,  Duwaine [place], SLP            Problem: SLP Adult - Impaired Swallowing  Goal: By Discharge: Advance to least restrictive diet without signs or symptoms of aspiration for planned discharge setting.  See evaluation for individualized goals.  Description: Speech Therapy Goals  Initiated 11/02/2022    1. Patient will tolerate baseline diet without adverse effects within 7 days.   Outcome: Progressing

## 2022-11-03 ENCOUNTER — Inpatient Hospital Stay: Admit: 2022-11-04 | Payer: PRIVATE HEALTH INSURANCE | Primary: Primary Care

## 2022-11-03 ENCOUNTER — Inpatient Hospital Stay: Admit: 2022-11-03 | Payer: PRIVATE HEALTH INSURANCE | Primary: Primary Care

## 2022-11-03 LAB — VAS TRANSCRANIAL DOPPLER COMPLETE
Basilar Artery EDV: 30.4 cm/s
Basilar Artery Mean Vel: 49 cm/s
Basilar Artery PSV: 86.1 cm/s
Body Surface Area: 1.74 m2
Left ACA EDV: 44.8 cm/s
Left ACA Mean Velocity: 66.8 cm/s
Left ACA PSV: 110.8 cm/s
Left External ICA Mean Velocity: 35 cm/s
Left ICA EDV: 26.7 cm/s
Left ICA Mean Velocity: 44 cm/s
Left ICA PSV: 78.5 cm/s
Left ICA dist EDV: 21.3 cm/s
Left ICA dist PSV: 62.4 cm/s
Left Lindegaard Ratio: 1.9
Left MCA 1 EDV: 41.8 cm/s
Left MCA 1 Mean Velocity: 65.1 cm/s
Left MCA 1 PSV: 111.8 cm/s
Left PCA 1 EDV: 27 cm/s
Left PCA 1 Mean Velocity: 43.1 cm/s
Left PCA 1 PSV: 75.3 cm/s
Left Vertebral EDV: 28.6 cm/s
Left Vertebral Mean Velocity: 47.5 cm/s
Left Vertebral PSV: 85.2 cm/s
Right ACA EDV: 44.1 cm/s
Right ACA Mean Velocity: 69.4 cm/s
Right ACA PSV: 120.1 cm/s
Right External ICA Mean Velocity: 31 cm/s
Right ICA EDV: 29.3 cm/s
Right ICA Mean Velocity: 47.9 cm/s
Right ICA PSV: 85 cm/s
Right ICA dist EDV: 19.5 cm/s
Right ICA dist PSV: 54.2 cm/s
Right Lindegaard Ratio: 2.2
Right MCA 1 EDV: 41.4 cm/s
Right MCA 1 Mean Velocity: 68.3 cm/s
Right MCA 1 PSV: 122.2 cm/s
Right PCA 1 EDV: 34.5 cm/s
Right PCA 1 Mean Velocity: 56.1 cm/s
Right PCA 1 PSV: 99.2 cm/s
Right Vertebral EDV: 36.8 cm/s
Right Vertebral Mean Velocity: 55.4 cm/s
Right Vertebral PSV: 92.5 cm/s

## 2022-11-03 MED ORDER — LIDOCAINE 4 % EX PTCH
4 | Freq: Every day | CUTANEOUS | Status: DC
Start: 2022-11-03 — End: 2022-11-06
  Administered 2022-11-03 – 2022-11-06 (×4): 1 via TRANSDERMAL

## 2022-11-03 MED FILL — METOCLOPRAMIDE HCL 5 MG/ML IJ SOLN: 5 MG/ML | INTRAMUSCULAR | Qty: 2

## 2022-11-03 MED FILL — BUTALBITAL-APAP-CAFFEINE 50-325-40 MG PO TABS: 50-325-40 MG | ORAL | Qty: 1

## 2022-11-03 MED FILL — ACETAMINOPHEN EXTRA STRENGTH 500 MG PO TABS: 500 MG | ORAL | Qty: 2

## 2022-11-03 MED FILL — ONDANSETRON HCL 4 MG/2ML IJ SOLN: 4 MG/2ML | INTRAMUSCULAR | Qty: 2

## 2022-11-03 MED FILL — NIMODIPINE 30 MG PO CAPS: 30 MG | ORAL | Qty: 2

## 2022-11-03 MED FILL — NIMODIPINE 30 MG PO CAPS: 30 MG | ORAL | Qty: 1

## 2022-11-03 MED FILL — PANTOPRAZOLE SODIUM 20 MG PO TBEC: 20 MG | ORAL | Qty: 1

## 2022-11-03 MED FILL — LIPITOR 20 MG PO TABS: 20 MG | ORAL | Qty: 1

## 2022-11-03 MED FILL — LIDOCAINE PAIN RELIEF 4 % EX PTCH: 4 % | CUTANEOUS | Qty: 1

## 2022-11-03 NOTE — Plan of Care (Signed)
 Problem: Discharge Planning  Goal: Discharge to home or other facility with appropriate resources  Outcome: Progressing     Problem: Pain  Goal: Verbalizes/displays adequate comfort level or baseline comfort level  Outcome: Progressing     Problem: Safety - Adult  Goal: Free from fall injury  Outcome: Progressing     Problem: ABCDS Injury Assessment  Goal: Absence of physical injury  Outcome: Progressing

## 2022-11-03 NOTE — Procedures (Signed)
 PROCEDURE NOTE  Date: 11/03/2022   Name: Christina Stevens  Date of Birth: November 27, 1980    Procedures      Midline Insertion and Progress Note    PRE-PROCEDURE VERIFICATION  Correct Procedure: yes  Correct Site: yes  Temperature: Temp: 97.3 F (36.3 C), Temperature Source:    Recent Labs     11/01/22  1320 11/02/22  0235 11/03/22  0315   BUN 9   < > 5*   PLT 345   < > 302   INR 0.9  --   --    WBC 12.1*   < > 14.8*    < > = values in this interval not displayed.     Allergies: Decadron [dexamethasone], Adhesive tape, Chlorhexidine, Oxycodone , and Oxycodone -acetaminophen     PROCEDURE DETAIL  A single midline IV catheter was started for vascular access. The following documentation is in addition to the Midline properties in the lines/airways flowsheet:  Xylocaine  1% used intradermally: No  Lot #: [id number]  Catheter to vein ratio: 28%  Catheter Total Length: 8 (cm)  External Catheter Length: 0 (cm)  Circumference at insertion site: 27 (cm)  Vein Selection for Midline: right basilic  Complication Related to Insertion: none  This line is for non-vesicant/non- irritant IV infusion only.    Line is okay to use.    Tinnie JONETTA Foley, RN

## 2022-11-03 NOTE — Progress Notes (Signed)
 20:20 patient complained of headache, given fiorcet and reglan  as ordered    03:00 patient was anxious and tearful, insisted to go walk around. This RN walked the patient around the unit for one round and then placed back in bed.    04:00 patient with headache again, given the prn fiorcet. Had episode of vomiting and was given zofran  as ordered.

## 2022-11-03 NOTE — Progress Notes (Signed)
 Neurocritical Care Brief Progress Note:  42 y.o F with significant pmh for ADHD, PCOS, Peripheral neuropathy, Pick's disease per patient, and tobacco use transferred from [facility] to [facility] on 11/01/22 with acute SAH. CTA h-n was negative for a vascular source. Taken for dsa by Dr. Massie which is negative for vascular source with plan to follow up imaging in 7 days. Right groin/radial arteriotomy site is C/D/I without oozing, hematoma, tenderness, redness, bruising. + distal pulses.     02/10/22~CTH with new, small peripheral hypodensity in the right frontal lobe, concerning for acute infarction. Small amount of acute SAH in the basilar cisterns, not significantly changed.    03/13/2022~Pt downgraded to nstu. Pt stated headache has improved. neuro exams is stable. No focal deficits appreciated tonight. Labs reviewed~N+ 139, K+ 3.6, Mg 1.9, Phos 3.4. Leukocytosis trending down. afebrile. Continue to  monitor. On NS at 75cc. I/O is +1.6L euvolemia.    Pending MRIb wwo contrast, TTE with EF of 60 - 65%. NWM. A1c 5.2. pending lipid panel. Pt already on statin. SLP/OT/PT onboard.  Review of Systems:   Denied visual issues, no ear/nose, throat problems; no coughing or wheezing or shortness of breath, No chest pain or orthopnea, no abdominal pain, nausea or vomiting, No pain in the body or extremities, no psychiatric, neurological, endocrine, hematological or cardiac complaints except as noted above.      Physical Exam:   Blood pressure 129/75, pulse 52, temperature 98.6 F (37 C), temperature source Oral, resp. rate 18, height 1.549 m (5' 1), weight 71.7 kg (158 lb), SpO2 98%. Net IO Since Admission: 2,684.4 mL [11/03/22 1941]    GEN: Cooperative, Calm, Well developed and nourished patient in NAD  HEENT: Normocephalic. Non-icter, no congestion, alopecia   Lungs:  Diminished bilaterally ant, non-labored breathing, RA  Cardiac: S1,S2, normal rate and rhythm, with no murmurs. no gallops  Abdomen: hypoactive bowel sounds, no  distention, soft, non-tender  Extremities: 2+ Radial pulses, no clubbing, cyanosis, or edema  Skin: no rashes or lesions noted; Skin color appropriate and warm for ethnicity.       NEURO:  Mental status: Oriented to time, situation, place and person. Able to follow commands appropriately  Cranial Nerves: Attention and fund of knowledge is appropriate/intact. Speech/Language is intact/fluent. Normal recent and remote memory. EOMI, PERRL, 3mm, no nystagmus, no ptosis. No dysarthria or aphasia. Full facial strength, no asymmetry. Hearing intact bilaterally. Tongue protrudes to midline, palate elevates symmetrically. Shrug Shoulders B/L  Motor: Normal bulk and tone, 5/5 strength to all 4 extremities proximally and distally; left foot drop. No involuntary movements.  Coordination: FTN and HTS testing  Reflexes: +2 throughout, down going toes bilaterally   Sensation: Intact to all extremities to light touch except decreased to LLE.   Gait: Deferred        Continue current plan per NIS.      Case d/w: C. Kathline, NP, primary nurse, patient, and friend at the bedside       spent 25 minutes of time involved in chart review, lab review, imaging review       Elyn Bruins, AGAC-NP  Neurocritical Care Nurse Practitioner  [phone]

## 2022-11-03 NOTE — Progress Notes (Signed)
 Rounded on [facility] patients and provided Anointing of the Sick at request of patient/family.

## 2022-11-03 NOTE — Progress Notes (Signed)
 Neurointerventional Surgery Progress Note      Admit Date: 11/01/2022   LOS: 2 days      Daily Progress Note: 11/03/2022    Assessment and Plan    #Acute Non Traumatic SAH, DSA negative for vascular source. HH 2, MF 2   - Diagnostic cerebral angiogram on 11/01/22 negative for vascular source, plan to repeat DSA in 1 week               - Day 2/21 of Nimotop  to prevent delayed cerebral ischemia              - Goal euvolemia. Fluid management per Intensivist.               - Strict I&O              - Echo w/EF 60-65 %, LV size normal, LA size normal   - Q2 neuro checks. If exam remains stable, patient can downgrade out of the ICU today               - TCDs daily, todays TCD is pending               - SBP goal 100-160              - PT/OT/SLP evals              - Assess NICOM q shift for fluid responsiveness  - MRI Brain pending to further eval right frontal hypodensity on CT Head, will follow     Patient discussed with Intensivist, Dr. Massie and RN     HPI    Christina Stevens is a 42 y.o.  White (non-Hispanic)  female with a pmh of PCOS, peripheral neuropathy, and ADHD who presented to [facility] ED today reporting a severe headache x6 days with slurred speech and stating she passed out today.  A Code Stroke was called and a stat CT head was obtained which showed acute SAH. CTA head/neck was then obtained which was negative for a vascular source. BP noted to be 179/102 on arrival to ED. NSGY was consulted and recommended NIS consult. Pt was then transferred to [facility] ICU for higher level of care. Pt wife at bedside reports pt takes adderall for ADHD, last took this yesterday. She reports the pt has a prescription for medical marijuana and smokes marijuana 3 times per day every single day. She denies pt using any street drugs and she states the pt does not drink any alcohol.      Pt follows with [facility], was last seen 09/27/22. Per notes, pt has had left sided weakness since 2019 peripheral neuropathy, foot drop, balance  and gait disturbance, and memory issues that started after a prolonged hospitalization for sepsis. She underwent EMG, MRI brain, C-spine, T-spine, lumbar spine as part of the work up. 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    Subjective      No acute events overnight. Neurological examination is stable. Though yesterdays CT Head shows diminished SAH compared to prior, there is a small peripheral hypodensity in the right frontal lobe concerning for stroke.    Intensivist team has ordered an MRI Brain for further evaluation.     Allergies   Allergen Reactions    Decadron [Dexamethasone] Swelling and Anxiety    Adhesive Tape Rash    Chlorhexidine Itching    Oxycodone  Swelling    Oxycodone -Acetaminophen   Itching     Past Medical History:   Diagnosis Date    ADHD     Elbert Shine infection     MRSA (methicillin resistant staph aureus) culture positive     MS (congenital mitral stenosis)     PCOS (polycystic ovarian syndrome)      No family history on file.  Social History     Tobacco Use    Smoking status: Every Day    Smokeless tobacco: Never   Substance Use Topics    Alcohol use: No      Prior to Admission Medications   Prescriptions Last Dose Informant Patient Reported? Taking?   acetaminophen  (TYLENOL ) 325 MG tablet   Yes No   Sig: Take 2 tablets by mouth every 6 hours as needed   amphetamine-dextroamphetamine (ADDERALL) 20 MG tablet   Yes No   Sig: Take 2 tablets by mouth daily.   atorvastatin  (LIPITOR ) 20 MG tablet   Yes No   Sig: Take 1 tablet by mouth daily   ipratropium-albuterol (DUONEB) 0.5-2.5 (3) MG/3ML SOLN nebulizer solution   Yes No   Sig: Inhale 3 mLs into the lungs every 4 hours as needed   Patient not taking: Reported on 03/17/2022   pantoprazole  (PROTONIX ) 20 MG tablet   Yes No   Sig: Take 1 tablet by mouth daily      Facility-Administered Medications: None       Objective:   Vital signs  Temp (24hrs), Avg:97.9 F (36.6 C),  Min:97.3 F (36.3 C), Max:98.7 F (37.1 C)   10/02 0701 - 10/02 1900  In: 1388.7 [I.V.:1388.7]  Out: -   09/30 1901 - 10/02 0700  In: 2945.7 [P.O.:1440; I.V.:1505.7]  Out: 2350 [Urine:2350]  BP 112/87   Pulse (!) 49   Temp 97.5 F (36.4 C) (Axillary)   Resp 13   Ht 1.549 m (5' 1)   Wt 71.7 kg (158 lb)   SpO2 100%   BMI 29.85 kg/m        Vitals:    11/03/22 0500 11/03/22 0600 11/03/22 0700 11/03/22 0800   BP: 115/74 (!) 122/96 112/87    Pulse: 63 63 (!) 49    Resp: 13 16 13     Temp:    97.5 F (36.4 C)   TempSrc:    Axillary   SpO2: 97% 97% 100%    Weight:       Height:            Physical Exam:  Constitutional: Middle aged female resting in bed   Respiratory:Unlabored respirations     Neurological:  MS: AAOx4, following commands   Speech: Fluent, repetition and naming intact   CN: EOMI, VFF, Face symmetric   Motor: Normal bulk and tone.Full strength throughout.  Sensation: Decreased sensation to light touch on the left to A/L ( this fluctuates, at times patient states that sensation is intact to light tough throughout)   Coordination: FNF intact  Gait: Deferred     Labs:  Lab Results   Component Value Date/Time    WBC 14.8 11/03/2022 03:15 AM    HGB 13.9 11/03/2022 03:15 AM    HCT 40.8 11/03/2022 03:15 AM    PLT 302 11/03/2022 03:15 AM    MCV 94.0 11/03/2022 03:15 AM     Lab Results   Component Value Date/Time    NA 139 11/03/2022 03:15 AM    K 3.6 11/03/2022 03:15 AM    CL 109 11/03/2022 03:15 AM  CO2 23 11/03/2022 03:15 AM    BUN 5 11/03/2022 03:15 AM    GFRAA >60 09/06/2017 06:55 PM     Imaging:  9/30 CT head: Acute [facility]    9/30 CTA head/neck: Negative for vascular source of hemorrhage.    10/1 CT Head:   1. New, small peripheral hypodensity in the right frontal lobe, concerning for acute infarction.  2. Small amount of acute subarachnoid hemorrhage in the basilar cisterns, not significantly changed.  3. No new hemorrhage    TCDs:  11/02/22: Negative for vasospasm   11/03/22: Pending     Charmaine GORMAN Amass, APRN - CNP  Neurointerventional Surgery Nurse Practitioner

## 2022-11-03 NOTE — Consults (Cosign Needed)
 History and Physical  Consult for ICU Downgrade    Date of Service:  11/03/2022  Primary Care Provider: Dalia Ozell RODES, APRN - NP  Source of information: The patient and Chart review    Chief Complaint: Headache    History of Presenting Illness:   Christina Stevens is a 42 y.o. female who initially presented to [facility] with a chief complaint of headache and difficulty speaking.  Her headache reportedly started 6 days prior to admission, reported as severe at onset, waking her from sleep.  Headache medications did not provide any relief.  On 9/30, the pain became worse and had an episode of syncope ~10 AM. Last known well time was a 6:30 AM, she had spoken with a friend and at that time her speech was normal. Her wife spoke with her ~9 AM and her speech was normal then as well.  After the syncopa; episode, her speech was slurred and she had difficulty getting words out. Reported feeling weak and shaky but weaker on the left side (which had worsened over 2 days PTA).  CT of the head showed a small acute SAH in the basilar cisterns. Patient was transferred to [facility] 9/30 and underwent a diagnostic cerebral angiogram with no discovery of source for [facility] identified.    10/1: Day 1/21 Nimotop .  CT head showed a new small peripheral hypodensity in the right frontal lobe, concerning for acute infarction.  Small amount of acute subarachnoid hemorrhage in the basilar cisterns, not significantly changed.  No new hemorrhage.    10/2: Day 2 Nimotop  to prevent delayed cerebral ischemia.  MRI is pending to evaluate new hypodensity in the right frontal lobe.  Patient is hemodynamically stable, hospitalist team was consulted for ICU downgrade.  Patient was seen and examined in the ICU, she was lying in bed in no acute distress, cooperative and interactive with assessment. Reports a HA today but controlled pain. No chest pain or pressure, shortness of breath or cough.  Denies any GI complaints such as nausea,  vomiting, diarrhea or constipation and has no dysuria. Pt appears to be medically stable for transfer to [facility]. Anxious to leave ICU.       REVIEW OF SYSTEMS:  As mentioned above in the HPI    Past Medical History:   Diagnosis Date    ADHD     Elbert Shine infection     MRSA (methicillin resistant staph aureus) culture positive     MS (congenital mitral stenosis)     PCOS (polycystic ovarian syndrome)       Past Surgical History:   Procedure Laterality Date    APPENDECTOMY      COLONOSCOPY N/A 03/17/2022    COLONOSCOPY W/ ENDOSCOPIC MUCOSAL RESECTION performed by Danial Bias, MD at [facility] ENDOSCOPY    IR CHOLECYSTOSTOMY PERCUTANEOUS COMPLETE       Prior to Admission medications    Medication Sig Start Date End Date Taking? Authorizing Provider   pantoprazole  (PROTONIX ) 20 MG tablet Take 1 tablet by mouth daily    [provider]   acetaminophen  (TYLENOL ) 325 MG tablet Take 2 tablets by mouth every 6 hours as needed 11/24/20   Automatic Reconciliation, Ar   amphetamine-dextroamphetamine (ADDERALL) 20 MG tablet Take 2 tablets by mouth daily.    Automatic Reconciliation, Ar   atorvastatin  (LIPITOR ) 20 MG tablet Take 1 tablet by mouth daily    Automatic Reconciliation, Ar   ipratropium-albuterol (DUONEB) 0.5-2.5 (3) MG/3ML SOLN nebulizer solution Inhale 3 mLs  into the lungs every 4 hours as needed  Patient not taking: Reported on 03/17/2022 11/24/20   Automatic Reconciliation, Ar     Allergies   Allergen Reactions    Decadron [Dexamethasone] Swelling and Anxiety    Adhesive Tape Rash    Chlorhexidine Itching    Oxycodone  Swelling    Oxycodone -Acetaminophen  Itching      No family history on file.   Social History:  reports that she has been smoking. She has never used smokeless tobacco. She reports current drug use. Drug: Marijuana Oda). She reports that she does not drink alcohol.   Social Determinants of Health     Tobacco Use: High Risk (09/27/2022)    Received from [facility]    Patient History     Smoking  Tobacco Use: Some Days     Smokeless Tobacco Use: Never     Passive Exposure: Current   Alcohol Use: Patient Unable To Answer (11/01/2022)    AUDIT-C     Frequency of Alcohol Consumption: Patient unable to answer     Average Number of Drinks: Patient unable to answer     Frequency of Binge Drinking: Patient unable to answer   Financial Resource Strain: Not on file   Food Insecurity: Not on file   Transportation Needs: Not on file   Physical Activity: Not on file   Stress: Not on file   Social Connections: Not on file   Intimate Partner Violence: Not on file   Depression: Not at risk (09/27/2022)    Received from [facility]    PHQ-2     Patient Health Questionnaire-2 Score: 0   Housing Stability: Not on file   Interpersonal Safety: Not At Risk (11/01/2022)    Interpersonal Safety Domain Source: IP Abuse Screening     Physical abuse: Denies     Verbal abuse: Denies     Emotional abuse: Denies     Financial abuse: Denies     Sexual abuse: Denies   Utilities: Not on file      Medications were reconciled to the best of my ability given all available resources at the time of admission. Route is PO if not otherwise noted.     Family and social history were personally reviewed, all pertinent and relevant details are outlined as above.    Objective:   BP 121/81   Pulse 60   Temp 97.3 F (36.3 C) (Axillary)   Resp 13   Ht 1.549 m (5' 1)   Wt 71.7 kg (158 lb)   SpO2 100%   BMI 29.85 kg/m         PHYSICAL EXAM:     General: Young caucasian female lying in bed in no acute distress, cooperative and interactive with assessment  HEENT: EOMI, moist mucus membranes  Neck: trachea midline  Chest: Clear to auscultation bilaterally, sats 100% RA   CVS: RRR, no murmur - HR 51  Abd: Soft, non-tender, non-distended, +bowel sounds   Ext: MAE. No edema  Neuro/Psych: Pleasant mood and affect, sensory grossly within normal limit, Strength 5/5 in all extremities. Alert and oriented to person, place, time and situation  Skin: Warm, dry,  without rashes or lesions    Data Review:   I have independently reviewed and interpreted patient's lab and all other diagnostic data    Abnormal Labs Reviewed   URINE DRUG SCREEN - Abnormal; Notable for the following components:       Result Value    Benzodiazepines, Urine Positive (*)  Opiates, Urine Positive (*)     THC, TH-Cannabinol, Urine Positive (*)     All other components within normal limits   CBC - Abnormal; Notable for the following components:    WBC 15.5 (*)     Platelets 121 (*)     All other components within normal limits   COMPREHENSIVE METABOLIC PANEL - Abnormal; Notable for the following components:    Glucose 115 (*)     BUN/Creatinine Ratio 8 (*)     All other components within normal limits   BASIC METABOLIC PANEL - Abnormal; Notable for the following components:    Chloride 109 (*)     Glucose 115 (*)     BUN/Creatinine Ratio 9 (*)     Calcium  8.4 (*)     All other components within normal limits   CBC - Abnormal; Notable for the following components:    WBC 14.8 (*)     All other components within normal limits   BASIC METABOLIC PANEL - Abnormal; Notable for the following components:    Chloride 109 (*)     Glucose 127 (*)     BUN 5 (*)     BUN/Creatinine Ratio 7 (*)     All other components within normal limits     IMAGING:   Vascular transcranial Doppler (TCD) complete         CT HEAD WO CONTRAST   Final Result      1. New, small peripheral hypodensity in the right frontal lobe, concerning for   acute infarction.   2. Small amount of acute subarachnoid hemorrhage in the basilar cisterns, not   significantly changed.   3. No new hemorrhage.            Electronically signed by Oneil Fireman, MD      Vascular transcranial Doppler (TCD) complete   Final Result      IR ARCH UNI CAR CERV CEREBRAL    (Results Pending)   MRI BRAIN W WO CONTRAST    (Results Pending)      Notes reviewed from all clinical/nonclinical/nursing services involved in patient's clinical care. Care coordination discussions  were held with appropriate clinical/nonclinical/ nursing providers based on care coordination needs.     Assessment/Plan:     SAH  - CT head 9/30: Small acute subarachnoid hemorrhage in the basilar cisterns   - CTA head/neck 9/30: No intracranial aneurysm or other focal vascular abnormality accounting for subarachnoid hemorrhage.  - Underwent a cerebral angiogram on 9/30 with no source of SAH identified  - CT head 10/1: New, small peripheral hypodensity in the right frontal lobe, concerning for acute infarction. Small amount of acute subarachnoid hemorrhage in the basilar cisterns, not  significantly changed. No new hemorrhage  - MRI has been ordered - has to be done with anesthesia so scheduled for tomorrow  - Echo: normal LVEF, EF 60-65% normal diastolic function.   - A1c 5.2  - FLP ordered, not done. Ordered for am with other labs.  - PT/OT    ADHD  - takes Adderall at home    Hx Hyperlipidemia  - on lipitor  at home, restarted  - check FLP in am    DIET: ADULT DIET; Regular   ISOLATION PRECAUTIONS: No active isolations  CODE STATUS: Full Code   Central Line:   none  DVT PROPHYLAXIS: SCDs  EMERGENCY CONTACT/SURROGATE DECISION MAKER: Christina Stevens [phone]    CRITICAL CARE WAS PERFORMED FOR THIS ENCOUNTER: NO.    Signed  By: Silvano Notch, APRN - NP     November 03, 2022       Please note that this dictation may have been completed with [employer], the computer voice recognition software.  Quite often unanticipated grammatical, syntax, homophones, and other interpretive errors are inadvertently transcribed by the computer software.  Please disregard these errors.  Please excuse any errors that have escaped final proofreading.

## 2022-11-03 NOTE — Plan of Care (Signed)
 Speech LAnguage Pathology TREATMENT/DISCHARGE    Patient: Christina Stevens (42 y.o. female)  Date: 11/03/2022  Primary Diagnosis: Subarachnoid bleed (HCC) [I60.9]       Precautions:   (SBP 100-160)                  ASSESSMENT :  Patient seen upright at bedside for dysphagia treatment.  Patient reports no difficulty with p.o. intake over the last 3 meals.  Observed with thin liquids with single and successive sips with no overt s/s aspiration.  Patient with adequate mastication, bolus formation and oral clearance.  Will complete orders.  Oropharyngeal swallow appears intact.    Patient will be discharged from skilled speech-language pathology services at this time.     PLAN :  Recommendations and Planned Interventions:  Diet: Regular and thin liquids  --meds as tolerated         Acute SLP Services: No, patient will be discharged from acute skilled speech-language pathology at this time.    Discharge Recommendations: No, additional SLP treatment not indicated at discharge     SUBJECTIVE:   Patient stated, "yeah, fine." related to meals    OBJECTIVE:     Past Medical History:   Diagnosis Date    ADHD     Elbert Shine infection     MRSA (methicillin resistant staph aureus) culture positive     MS (congenital mitral stenosis)     PCOS (polycystic ovarian syndrome)      Past Surgical History:   Procedure Laterality Date    APPENDECTOMY      COLONOSCOPY N/A 03/17/2022    COLONOSCOPY W/ ENDOSCOPIC MUCOSAL RESECTION performed by Danial Bias, MD at [facility] ENDOSCOPY    IR CHOLECYSTOSTOMY PERCUTANEOUS COMPLETE       Prior Level of Function/Home Situation:   Social/Functional History  Lives With: Spouse (wife)  Type of Home: House  Home Layout: Two level, Bed/Bath upstairs  Home Access: Stairs to enter with rails  Entrance Stairs - Number of Steps: 5  Entrance Stairs - Rails: Both  Bathroom Shower/Tub: Cabin Crew: None  Home Equipment: None  Has the patient had two or more falls in the past year or any  fall with injury in the past year?: No  ADL Assistance: Psychologist, Sport And Exercise Assistance: Independent  Ambulation Assistance: Independent  Transfer Assistance: Independent  Active Driver: Yes  Mode of Transportation: Car  Occupation: Full time employment  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 Trials:  PO Trials  Assessment Method(s): Observation  Patient Position: upright in bed  Vocal Quality: No Impairment  Consistency Presented: Thin;Regular  How Presented: Self-fed/presented;Cup/sip;Successive Swallows  Bolus Acceptance: No impairment  Bolus Formation/Control: No impairment  Propulsion: No impairment  Oral Residue: None  Aspiration Signs/Symptoms: None  Pharyngeal Phase Characteristics: No impairment, issues, or problems  Oral Phase  Oral Phase - Comment: WFL         Motor Speech:         Language Comprehension and Expression:                   Neuro-Linguistics:  Voice:       Respiratory Status/Airway:  Room air                           Functional Oral Intake Scale (FOIS): 7--Total oral diet with no restrictions    After treatment:   Patient left in no apparent distress in bed, Call bell left within reach, and Nursing notified    COMMUNICATION/EDUCATION:       The patient's plan of care including recommendations, planned interventions, and recommended diet changes were discussed with: Registered nurse        Thank you,  Therisa LELON Hoyle, SLP    SLP Individual Minutes  Time In: 1023  Time Out: 1031  Minutes: 8     Problem: SLP Adult - Impaired Swallowing  Goal: By Discharge: Advance to least restrictive diet without signs or symptoms of aspiration for planned discharge setting.  See evaluation for individualized goals.  Description: Speech Therapy Goals  Initiated 11/02/2022    1.  Patient will tolerate baseline diet without adverse effects within 7 days.   Outcome: HH/HSPC Resolved Met

## 2022-11-03 NOTE — Progress Notes (Signed)
 Spiritual Health History and Assessment/Progress Note  [facility]    Rituals, Rites and Terminous,  ,  ,      Name: Christina Stevens MRN: [id number]    Age: 42 y.o.     Sex: female   Language: English   Religion: Catholic   Subarachnoid hemorrhage (HCC)     Date: 11/03/2022            Total Time Calculated: 5 min              Spiritual Assessment continued in [facility] 7S1 INTENSIVE CARE        Referral/Consult From: Clergy/Religious Leader   Encounter Overview/Reason: Rituals, Rites and Research Scientist (life Sciences) Provided For: Patient    Faith, Belief, Meaning:   Patient is connected with a faith tradition or spiritual practice  Family/Friends No family/friends present      Importance and Influence:  Patient has spiritual/personal beliefs that influence decisions regarding their health  Family/Friends No family/friends present    Community:  Patient expresses feelings of isolation: Other: unkown and Other: unknown  Family/Friends No family/friends present    Assessment and Plan of Care:     Patient Interventions include: Provided sacramental/religious ritual  Family/Friends Interventions include: No family/friends present    Patient Plan of Care: Spiritual Care available upon further referral  Family/Friends Plan of Care: Spiritual Care available upon further referral    Electronically signed by BUEL SAUERS, Chaplain on 11/03/2022 at 12:50 PM     Eucharistic ministry visit attempted.  Mrs. Ringer is Catholic. She was not available for a visit.  Prayer for spiritual communion offered outside her room/.    Chaplain Sr. Buel Sauers, SBS, RN, ACSW, LCSW  Chaplain Page:  [phone])

## 2022-11-03 NOTE — Progress Notes (Signed)
 CRITICAL CARE NOTE      Name: Christina Stevens   DOB: [birth year]   MRN: [id number]   Date: 11/03/2022      REASON FOR ICU ADMISSION:  [facility]     BRIEF PATIENT SUMMARY AND HOSPITAL COURSE   ADAJAH COCKING is a 42yoF w/ PCOS, neuropathy, Picks dz, transferred for acute SAH with no obvious vascular source.    9/30: Diagnostic angiogram with no vascular source. Admit to ICU.     10/1: Start Nimotop . IVF. Q1h neuro checks. Working with PT/OT.      10/2: Head CT with questionable area of infarct. Plan for MRI brain w/ and w/o. Stable for floor.     COMPREHENSIVE ASSESSMENT & PLAN     SAH  Unknown etiology, no vascular source on angiogram, ddx vasculitis vs RCVS; known hx of functional neurologic disorder   - Repeat HCT stable   - Plan for repeat angiogram next week   - Maintenance fluids, goal euvolemia   - Nimotop  Day 2/21  - Q2 neuro checks   - Daily TCDs  - SBP 100-160     Right Frontal Hypodensity   Concerning for area of ischemia   - Head CT (10/1): New small peripheral hypodensity in R frontal lobe c/f acute infarct  - Plan for MRI brain with and without   - Check lipid panel, A1c   - Consider neuro consult pending MRI results    Headache   In setting of SAH   - Reporting 10/10 head pain, no neuro changes on exam   - Scheduled and PRN tylenol , PRN fioricet        ICU DAILY CHECKLIST     Code Status: Full Code    DVT Prophylaxis: Held, SAH  T/L/D: Tubes: None  Lines: Peripheral IV  Drains: None  SUP: PPI  Diet: ADULT DIET; Regular   Activity Level: PT/OT  ABCDEF Bundle/Checklist Completed: Yes  Disposition: Transfer to non-ICU bed  Multidisciplinary Rounds Completed:  Yes  Goals of Care Discussion/Palliative: No  Patient/Family Updated: Yes    SUBJECTIVE   Patient resting in bed in no apparent distress. States that she slept terribly overnight due to headache and nausea with one episode of emesis. During interview, patient abruptly got out of bed and began to use the bathroom with no gait abnormalities noted. Further  ROS negative.      OBJECTIVE   Physical Exam  Constitutional:       General: She is not in acute distress.  Eyes:      Pupils: Pupils are equal, round, and reactive to light.   Cardiovascular:      Rate and Rhythm: Normal rate and regular rhythm.      Pulses: Normal pulses.      Heart sounds: Normal heart sounds. No murmur heard.  Pulmonary:      Effort: Pulmonary effort is normal. No respiratory distress.      Breath sounds: Normal breath sounds.   Abdominal:      General: Abdomen is flat. Bowel sounds are normal.      Palpations: Abdomen is soft.   Skin:     General: Skin is warm and dry.   Neurological:      General: No focal deficit present.      Mental Status: She is alert and oriented to person, place, and time.       Labs and Data: Reviewed 11/03/22  Recent Labs     11/01/22  1320 11/02/22  0235  11/03/22  0315   WBC 12.1* 15.5* 14.8*   RBC 4.79 3.96 4.34   HGB 15.3 13.2 13.9   HCT 43.9 37.0 40.8   MCV 91.6 93.4 94.0   RDW 11.6 12.1 11.6   PLT 345 121* 302     Recent Labs     11/01/22  1320 11/02/22  0502 11/02/22  1134   NA 138 136 138   K 4.1 3.8 3.6   CL 108 106 109*   CO2 25 23 24    BUN 9 6 7    CREATININE 0.82 0.80 0.81   GLUCOSE 113* 115* 115*   PHOS  --  3.1  --    MG  --  2.4  --      Recent Labs     11/01/22  1320 11/02/22  0502   AST 16 19   ALT 17 16   BILITOT 0.3 0.4   ALKPHOS 70 70     Recent Labs     11/01/22  1320   PROTIME 9.8   INR 0.9     Medications: Reviewed 11/03/22  Imaging: Reviewed 11/03/22   Most Recent XR  XR CERVICAL SPINE (4-5 VIEWS) 04/12/2022    Narrative  AP, Lateral, Flexion, Extension.    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    CONTRAST: None.    TECHNIQUE: Unenhanced CT of the head was performed using 5 mm  images. Brain and  bone windows were generated. Coronal and sagittal reformats. CT dose reduction  was achieved through use of a standardized protocol tailored for this  examination and automatic exposure control for dose modulation.    FINDINGS:  The ventricles and sulci are normal in size, shape and configuration. There is  no significant white matter disease. Again demonstrated is a small amount of  acute subarachnoid hemorrhage in the basilar cisterns, not significantly changed  from prior study. There is no extra-axial collection. There is a new peripheral  wedge-shaped hypodensity in the right frontal lobe, not present previously,  concerning for acute infarct.    The bone windows demonstrate no abnormalities. The visualized portions of the  paranasal sinuses and mastoid air cells are clear.    Impression  1. New, small peripheral hypodensity in the right frontal lobe, concerning for  acute infarction.  2. Small amount of acute subarachnoid hemorrhage in the basilar cisterns, not  significantly changed.  3. No new hemorrhage.        Electronically signed by Oneil Fireman, MD    Most Recent MRI  MRI BRAIN WO CONTRAST 04/23/2022    Narrative  INDICATION:   Cervicalgia; Radiculopathy, cervical region; Polyneuropathy,  unspecified; Cervical disc disorder with myelopathy, cervicothoracic region    EXAMINATION:  MRI BRAIN WO CONTRAST    COMPARISON: November 08, 2017    TECHNIQUE:  Multiplanar multisequence acquisition without contrast of the brain.      FINDINGS:    Ventricles:  Midline, no hydrocephalus.  Brain Parenchyma/Brainstem:  Normal for age. No acute infarction.  Intracranial Hemorrhage:  None.  Basal Cisterns:  Normal.  Flow Voids:  Normal.  Additional Comments: Partly visualized degenerative changes in the cervical  spine, see separate cervical spine MRI  report from the same day.    Impression  No significant abnormality or acute process.    Most Recent Echo  No results found for this or any previous  visit.      BP 112/87   Pulse (!) 49   Temp 97.5 F (36.4 C) (Axillary)   Resp 13   Ht 1.549 m (5' 1)   Wt 71.7 kg (158 lb)   SpO2 100%   BMI 29.85 kg/m      Temp (24hrs), Avg:97.9 F (36.6 C), Min:97.3 F (36.3 C), Max:98.7 F (37.1 C)           Intake/Output:     Intake/Output Summary (Last 24 hours) at 11/03/2022 0854  Last data filed at 11/03/2022 0800  Gross per 24 hour   Intake 2265.78 ml   Output 200 ml   Net 2065.78 ml       CRITICAL CARE DOCUMENTATION  I had a face to face encounter with the patient, reviewed and interpreted patient data including clinical events, labs, images, vital signs, I/O's, and examined patient.  I have discussed the case and the plan and management of the patient's care with the consulting services, the bedside nurses and the respiratory therapist.      NOTE OF PERSONAL INVOLVEMENT IN CARE   This patient has a high probability of imminent, clinically significant deterioration, which requires the highest level of preparedness to intervene urgently. I participated in the decision-making and personally managed or directed the management of the following life and organ supporting interventions that required my frequent assessment to treat or prevent imminent deterioration.    I personally spent 35 minutes of critical care time.  This is time spent at this critically ill patient's bedside actively involved in patient care as well as the coordination of care.  This does not include any procedural time which has been billed separately.    Delon JONETTA Neas, PA   Critical Care Medicine  [employer]

## 2022-11-04 ENCOUNTER — Inpatient Hospital Stay: Admit: 2022-11-04 | Payer: PRIVATE HEALTH INSURANCE | Primary: Primary Care

## 2022-11-04 LAB — EKG 12-LEAD
Atrial Rate: 68 {beats}/min
Diagnosis: NORMAL
P Axis: 69 degrees
P-R Interval: 152 ms
Q-T Interval: 456 ms
QRS Duration: 78 ms
QTc Calculation (Bazett): 484 ms
R Axis: 68 degrees
T Axis: 68 degrees
Ventricular Rate: 68 {beats}/min

## 2022-11-04 LAB — BASIC METABOLIC PANEL
Anion Gap: 10 mmol/L (ref 2–12)
BUN/Creatinine Ratio: 8 — ABNORMAL LOW (ref 12–20)
BUN: 7 mg/dL (ref 6–20)
CO2: 22 mmol/L (ref 21–32)
Calcium: 9.3 mg/dL (ref 8.5–10.1)
Chloride: 106 mmol/L (ref 97–108)
Creatinine: 0.83 mg/dL (ref 0.55–1.02)
Est, Glom Filt Rate: 90 mL/min/{1.73_m2} (ref >60)
Glucose: 105 mg/dL — ABNORMAL HIGH (ref 65–100)
Potassium: 3.6 mmol/L (ref 3.5–5.1)
Sodium: 138 mmol/L (ref 136–145)

## 2022-11-04 LAB — CBC
Hematocrit: 44.6 % (ref 35.0–47.0)
Hemoglobin: 15.6 g/dL (ref 11.5–16.0)
MCH: 32.4 pg (ref 26.0–34.0)
MCHC: 35 g/dL (ref 30.0–36.5)
MCV: 92.7 FL (ref 80.0–99.0)
MPV: 10.4 FL (ref 8.9–12.9)
Nucleated RBCs: 0 /100{WBCs}
Platelets: 324 10*3/uL (ref 150–400)
RBC: 4.81 M/uL (ref 3.80–5.20)
RDW: 11.5 % (ref 11.5–14.5)
WBC: 12.9 10*3/uL — ABNORMAL HIGH (ref 3.6–11.0)
nRBC: 0 10*3/uL (ref 0.00–0.01)

## 2022-11-04 LAB — LIPID PANEL
Chol/HDL Ratio: 3 (ref 0.0–5.0)
Cholesterol, Total: 166 mg/dL (ref <200)
HDL: 56 mg/dL
LDL Cholesterol: 74.4 mg/dL (ref 0–100)
Triglycerides: 178 mg/dL — ABNORMAL HIGH (ref <150)
VLDL Cholesterol Calculated: 35.6 mg/dL

## 2022-11-04 LAB — MAGNESIUM: Magnesium: 2 mg/dL (ref 1.6–2.4)

## 2022-11-04 LAB — PHOSPHORUS: Phosphorus: 4 mg/dL (ref 2.6–4.7)

## 2022-11-04 MED ORDER — PROCHLORPERAZINE EDISYLATE 10 MG/2ML IJ SOLN
10 | Freq: Once | INTRAMUSCULAR | Status: AC
Start: 2022-11-04 — End: 2022-11-03

## 2022-11-04 MED ORDER — TRAMADOL HCL 50 MG PO TABS
50 | Freq: Four times a day (QID) | ORAL | Status: DC | PRN
Start: 2022-11-04 — End: 2022-11-06
  Administered 2022-11-04 – 2022-11-05 (×4): 50 mg via ORAL

## 2022-11-04 MED ADMIN — prochlorperazine (COMPAZINE) injection 5 mg: 5 mg | INTRAVENOUS | NDC 00641613501

## 2022-11-04 MED FILL — BUTALBITAL-APAP-CAFFEINE 50-325-40 MG PO TABS: 50-325-40 MG | ORAL | Qty: 1

## 2022-11-04 MED FILL — ATORVASTATIN CALCIUM 40 MG PO TABS: 40 MG | ORAL | Qty: 1

## 2022-11-04 MED FILL — LIDOCAINE PAIN RELIEF 4 % EX PTCH: 4 % | CUTANEOUS | Qty: 1

## 2022-11-04 MED FILL — NIMODIPINE 30 MG PO CAPS: 30 MG | ORAL | Qty: 2

## 2022-11-04 MED FILL — METOCLOPRAMIDE HCL 5 MG/ML IJ SOLN: 5 MG/ML | INTRAMUSCULAR | Qty: 2

## 2022-11-04 MED FILL — TRAMADOL HCL 50 MG PO TABS: 50 MG | ORAL | Qty: 1

## 2022-11-04 MED FILL — PROCHLORPERAZINE EDISYLATE 10 MG/2ML IJ SOLN: 10 MG/2ML | INTRAMUSCULAR | Qty: 2

## 2022-11-04 MED FILL — ACETAMINOPHEN EXTRA STRENGTH 500 MG PO TABS: 500 MG | ORAL | Qty: 2

## 2022-11-04 MED FILL — PANTOPRAZOLE SODIUM 20 MG PO TBEC: 20 MG | ORAL | Qty: 1

## 2022-11-04 NOTE — Progress Notes (Signed)
 Neurointerventional Surgery Progress Note  Neurocritical Care NP    Admit Date: 11/01/2022   LOS: 3 days      Daily Progress Note: 11/04/2022    Assessment:     Acute non-traumatic SAH, DSA 11/01/22 negative for vascular source. Hunt Hess 2, Fisher Grade 2    Plan:                 - Day 3/21 of Nimotop  to prevent delayed cerebral ischemia              - Strict I&O, goal is euvolemia              - q 2 neuro checks during the day, OK for q4 at night while sleeping              - TCDs daily                - SBP goal 100-160              - PT/OT/SLP   - MRI Brain pending to further eval right frontal hypodensity on CT Head      Plan d/w Dr. Massie. Pt wife at bedside updated on plan of care.     HPI: Christina Stevens is a 42 y.o.  White (non-Hispanic)  female with a pmh of PCOS, peripheral neuropathy, and ADHD who presented to [facility] ED 11/01/22 reporting a severe headache x6 days with slurred speech and stating she passed out .  A Code Stroke was called and a stat CT head was obtained which showed acute SAH. CTA head/neck was then obtained which was negative for a vascular source. BP noted to be 179/102 on arrival to ED. NSGY was consulted and recommended NIS consult. Pt was then transferred to [facility] ICU for higher level of care. Pt wife at bedside reports pt takes adderall for ADHD, last took this yesterday. She reports the pt has a prescription for medical marijuana and smokes marijuana 3 times per day every single day. She denies pt using any street drugs and she states the pt does not drink any alcohol.      Pt follows with [facility], was last seen 09/27/22. Per notes, pt has had left sided weakness since 2019 peripheral neuropathy, foot drop, balance and gait disturbance, and memory issues that started after a prolonged hospitalization for sepsis. She underwent EMG, MRI brain, C-spine, T-spine, lumbar spine as part of the work up. Per Neurology notes, these studies were suggestive of a functional neurologic  disorder, was not felt to have MS.    Subjective:     No neuro events overnight.     Current Facility-Administered Medications   Medication Dose Route Frequency Provider Last Rate Last Admin    acetaminophen  (TYLENOL ) tablet 1,000 mg  1,000 mg Oral 3 times per day Heard, Courtney S, APRN - CNP   1,000 mg at 11/04/22 0559    lidocaine  4 % external patch 1 patch  1 patch TransDERmal Daily Ballagh, Eva A, APRN - NP   1 patch at 11/03/22 2207    sodium chloride  flush 0.9 % injection 5-40 mL  5-40 mL IntraVENous 2 times per day Effie Sor, APRN - CNP   10 mL at 11/03/22 2215    sodium chloride  flush 0.9 % injection 5-40 mL  5-40 mL IntraVENous PRN Effie Sor, APRN - CNP        0.9 % sodium chloride  infusion   IntraVENous PRN Effie Sor,  APRN - CNP   New Bag at 11/01/22 1653    ondansetron  (ZOFRAN -ODT) disintegrating tablet 4 mg  4 mg Oral Q8H PRN Effie Sor, APRN - CNP        Or    ondansetron  (ZOFRAN ) injection 4 mg  4 mg IntraVENous Q6H PRN Effie Sor, APRN - CNP   4 mg at 11/03/22 0419    magnesium  sulfate 1000 mg in dextrose  5% 100 mL IVPB  1,000 mg IntraVENous PRN Effie Sor, APRN - CNP        atorvastatin  (LIPITOR ) tablet 20 mg  20 mg Oral Daily Tanda Lonni SAUNDERS, MD   20 mg at 11/03/22 0805    pantoprazole  (PROTONIX ) tablet 20 mg  20 mg Oral Daily Tanda Lonni SAUNDERS, MD   20 mg at 11/03/22 9193    niMODipine  (NIMOTOP ) capsule 60 mg  60 mg Oral 6 times per day Junita Jesslyn HERO, APRN - NP   60 mg at 11/04/22 0324    0.9 % sodium chloride  infusion   IntraVENous Continuous Binford, Travia M, APRN - NP 75 mL/hr at 11/02/22 2049 New Bag at 11/02/22 2049    sodium chloride  flush 0.9 % injection 5-40 mL  5-40 mL IntraVENous 2 times per day Junita Jesslyn HERO, APRN - NP   10 mL at 11/03/22 2215    sodium chloride  flush 0.9 % injection 5-40 mL  5-40 mL IntraVENous PRN Binford, Jesslyn HERO, APRN - NP        0.9 % sodium chloride  infusion   IntraVENous PRN Binford, Travia M, APRN - NP         butalbital -acetaminophen -caffeine  (FIORICET , ESGIC ) per tablet 1 tablet  1 tablet Oral Q6H PRN Ballagh, Eva A, APRN - NP   1 tablet at 11/03/22 2308    metoclopramide  (REGLAN ) injection 10 mg  10 mg IntraVENous Q6H PRN Rosalba Elyn LABOR, APRN - NP   10 mg at 11/04/22 0440     Allergies   Allergen Reactions    Decadron [Dexamethasone] Swelling and Anxiety    Adhesive Tape Rash    Chlorhexidine Itching    Oxycodone  Swelling    Oxycodone -Acetaminophen  Itching     Review of Systems:  Pertinent items are noted in the History of Present Illness.    Objective:     Vital signs  Temp (24hrs), Avg:98 F (36.7 C), Min:97.3 F (36.3 C), Max:98.6 F (37 C)   No intake/output data recorded.  10/01 1901 - 10/03 0700  In: 1788.7 [P.O.:100; I.V.:1688.7]  Out: -     BP (!) 139/109   Pulse 68   Temp 98.2 F (36.8 C) (Oral)   Resp 14   Ht 1.549 m (5' 1)   Wt 71.7 kg (158 lb)   SpO2 98%   BMI 29.85 kg/m            Intake/Output Summary (Last 24 hours) at 11/04/2022 0830  Last data filed at 11/03/2022 2000  Gross per 24 hour   Intake 400 ml   Output --   Net 400 ml      Physical Exam:  Gen: NAD, calm, cooperative  Skin: Warm, dry, color appropriate for ethnicity.     Neurologic Exam:  Mental Status:    Alert and oriented x 4.  Appropriate affect, mood and behavior.       Speech and Language:      Clear, Normal fluency, repetition, comprehension and naming. Follows commands.     Cranial Nerves:  Pupils 3 mm, equal, round and briskly reactive to light.  Visual fields full to confrontation.  Extraocular movements intact.    Facial sensation intact.  Full facial strength, no asymmetry.   Hearing intact bilaterally.  No dysarthria. Tongue protrudes to midline, palate elevates symmetrically.    Shoulder shrug 5/5 bilaterally.    Motor:      No pronator drift. 5/5 power in right extremities. Left leg 4/5 strength (baseline per pt from knee surgery)  Bulk and tone normal.   No involuntary movements.    Sensation:      Sensation mildly  diminished in left lower leg (baseline per pt from prior surgery)  Parietal function intact.    Coordination:  FTN, HTS intact.    Gait:   Deferred.      24 hour results:  Recent Results (from the past 24 hour(s))   Vascular transcranial Doppler (TCD) complete    Collection Time: 11/03/22  1:18 PM   Result Value Ref Range    Right MCA 1 PSV 105.7 cm/s    Right MCA 1 EDV 38.4 cm/s    Right ACA PSV 81.5 cm/s    Right ACA EDV 31.0 cm/s    Right ICA PSV 114.9 cm/s    Right ICA EDV 50.2 cm/s    Right PCA 1 PSV 103.1 cm/s    Right PCA 1 EDV 50.0 cm/s    Right Vertebral PSV 59.1 cm/s    Right Vertebral EDV 23.5 cm/s    Left MCA 1 PSV 118.3 cm/s    Left MCA 1 EDV 50.9 cm/s    Left ACA PSV 97.0 cm/s    Left ACA EDV 37.4 cm/s    Left ICA PSV 101.8 cm/s    Left ICA EDV 34.4 cm/s    Left PCA 1 PSV 82.3 cm/s    Left PCA 1 EDV 33.1 cm/s    Left Vertebral PSV 65.4 cm/s    Left Vertebral EDV 22.6 cm/s    Basilar Artery PSV 76.4 cm/s    Basilar Artery EDV 36.9 cm/s    Right ICA dist PSV 50.4 cm/s    Right ICA dist EDV 19.6 cm/s    Left ICA dist PSV 60.2 cm/s    Left ICA dist EDV 19.6 cm/s    Body Surface Area 1.76 m2    Right MCA 1 Mean Velocity 60.8 cm/s    Right ACA Mean Velocity 47.8 cm/s    Right ICA Mean Velocity 71.8 cm/s    Right PCA 1 Mean Velocity 67.7 cm/s    Right Vertebral Mean Velocity 35.4 cm/s    Left MCA 1 Mean Velocity 73.4 cm/s    Left ACA Mean Velocity 57.3 cm/s    Left ICA Mean Velocity 56.9 cm/s    Left PCA 1 Mean Velocity 49.5 cm/s    Left Vertebral Mean Velocity 36.9 cm/s    Right External ICA Mean Velocity 30.0 cm/s    Right Lindegaard Ratio 2.00     Basilar Artery Mean Vel 50.0 cm/s    Left External ICA Mean Velocity 33.0 cm/s    Left Lindegaard Ratio 2.20      Imaging (personally reviewed by myself):  CT head 11/01/22: Acute SAH  CTA head/neck 11/01/22: Negative for vascular source of hemorrhage.  ECHO showed EF 60-65 %, LV size normal, LA size normal     TCD 11/02/22: No evidence of intracranial arterial  vasospasm.   TCD 11/03/22: Possible right PCA vasospasm.   TCD 11/04/22:  Right PCA vasospasm and possible left PCA vasospasm     I have spent 35 minutes of time involved in chart review, lab review, imaging review, consultations with specialists and attendings, family discussion/decision making and documentation.     Signed By: Tawni Skeeters, APRN - NP, Neurocritical Care Nurse Practitioner    November 04, 2022

## 2022-11-04 NOTE — Plan of Care (Signed)
 Problem: Physical Therapy - Adult  Goal: By Discharge: Performs mobility at highest level of function for planned discharge setting.  See evaluation for individualized goals.  Description: FUNCTIONAL STATUS PRIOR TO ADMISSION: Patient was independent and active without use of DME. Worked full time as an airline pilot.     HOME SUPPORT PRIOR TO ADMISSION: The patient lived with her wife and two teenage children but did not require assistance.    Physical Therapy Goals  Initiated 11/02/2022  1.  Patient will move from supine to sit and sit to supine and roll side to side in bed with independence within 7 day(s).    2.  Patient will perform sit to stand with independence within 7 day(s).  3.  Patient will transfer from bed to chair and chair to bed with independence using the least restrictive device within 7 day(s).  4.  Patient will ambulate with independence for 500 feet with the least restrictive device within 7 day(s).   5.  Patient will ascend/descend 14 stairs with one handrail(s) with supervision/set-up within 7 day(s).  6.  Patient will improve Berg Balance score by 4 points within 7 days.   Outcome: Progressing     PHYSICAL THERAPY TREATMENT    Patient: Christina Stevens (42 y.o. female)  Date: 11/04/2022  Diagnosis: Subarachnoid bleed (HCC) [I60.9] Subarachnoid hemorrhage (HCC)      Precautions:  (SBP 100-160)                      ASSESSMENT:  Patient continues to benefit from skilled PT services and is slowly progressing towards goals. Patient received in bed endorsing high levels of pain but agreeable to treatment. She was able to tolerate gait training and stair training with supervision level. She demonstrates some path variability with dynamic/dual tasks but no overt LOB. Patient with changes in CT on 10/2 revealing possible infarct in frontal lobe. Awaiting MRI results. Will continue to follow patient peripherally however from mobility stand point, she is appropriate for discharge home with OP PT  services.         PLAN:  Patient continues to benefit from skilled intervention to address the above impairments.  Continue treatment per established plan of care.    Recommendation for discharge: (in order for the patient to meet his/her long term goals):   Outpatient physical therapy for balance    Other factors to consider for discharge: no additional factors    IF patient discharges home will need the following DME: none       SUBJECTIVE:   Patient stated, It feels good to walk.    OBJECTIVE DATA SUMMARY:   Critical Behavior:  Orientation  Overall Orientation Status: Within Normal Limits  Orientation Level: Oriented X4  Cognition  Overall Cognitive Status: WNL    Functional Mobility Training:  Bed Mobility:  Bed Mobility Training  Overall Level of Assistance: Independent  Rolling: Independent  Supine to Sit: Independent  Sit to Supine: Independent  Scooting: Independent  Transfers:  Art Therapist: Yes  Overall Level of Assistance: Supervision  Sit to Stand: Supervision  Stand to Sit: Supervision  Balance:  Balance  Sitting: Intact  Standing: Intact   Ambulation/Gait Training:     Gait  Gait Training: Yes  Overall Level of Assistance: Supervision  Distance (ft): 500 Feet  Assistive Device: Gait belt  Interventions: Verbal cues  Base of Support: Center of gravity altered  Speed/Cadence: Pace decreased (< 100 feet/min)  Gait Abnormalities:  Path deviations       Activity Tolerance:   Fair     After treatment:   Patient left in no apparent distress in bed, Call bell within reach, and Side rails x3      COMMUNICATION/EDUCATION:   The patient's plan of care was discussed with: occupational therapist and registered nurse    Patient Education  Education Given To: Patient;Family  Education Provided: Role of Therapy;Plan of Engineering Geologist  Education Method: Verbal  Barriers to Learning: None  Education Outcome: Continued education needed      Orren Pitt, PT, DPT  Minutes: 24

## 2022-11-04 NOTE — Progress Notes (Signed)
 Provided pastoral care visit to Catholic patient.  Did not include sacramental care.

## 2022-11-04 NOTE — Progress Notes (Signed)
 Hospitalist Progress Note  Jannetta Slot, MD  Answering service: [phone] OR [phone] from in house phone        Date of Service:  11/04/2022  NAME:  Christina Stevens  DOB:  03-Feb-1980  MRN:  [id number]      Admission Summary:   Christina Stevens is a 42 y.o. female who initially presented to [facility] with a chief complaint of headache and difficulty speaking.  Her headache reportedly started 6 days prior to admission, reported as severe at onset, waking her from sleep.  Headache medications did not provide any relief.  On 9/30, the pain became worse and had an episode of syncope ~10 AM. Last known well time was a 6:30 AM, she had spoken with a friend and at that time her speech was normal. Her wife spoke with her ~9 AM and her speech was normal then as well.  After the syncopa; episode, her speech was slurred and she had difficulty getting words out. Reported feeling weak and shaky but weaker on the left side (which had worsened over 2 days PTA).  CT of the head showed a small acute SAH in the basilar cisterns. Patient was transferred to [facility] 9/30 and underwent a diagnostic cerebral angiogram with no discovery of source for [facility] identified.        Interval history / Subjective:   Patient seen and examined family at bedside patient is n.p.o. for MRI with anesthesia probably would want to be done due to scheduling issue discussed with RN increased pain medication discussed with patient and family at bedside as well patient seen by neurointerventional surgery day 3/ 21 of hematoma patient is still have head     Assessment & Plan:     Acute nontraumatic subarachnoid hemorrhage    - CT head 9/30: Small acute subarachnoid hemorrhage in the basilar cisterns   - CTA head/neck 9/30: No intracranial aneurysm or other focal vascular abnormality accounting for subarachnoid hemorrhage.  - Underwent a cerebral angiogram on 9/30  with no source of SAH identified  - CT head 10/1: New, small peripheral hypodensity in the right frontal lobe, concerning for acute infarction. Small amount of acute subarachnoid hemorrhage in the basilar cisterns, not  significantly changed. No new hemorrhage  - MRI has been ordered - has to be done with anesthesia so scheduled for tomorrow  - Echo: normal LVEF, EF 60-65% normal diastolic function.   - A1c 5.2,   - PT/OT  - Day 3 / 21 of Nimotop   -TCD's daily goal blood pressure systolic 160 PT/OT evaluation MRI pending to further evaluation for frontal hypodensity on the right MRI  will be done under anesthesia     ADHD  - takes Adderall at home     Hx Hyperlipidemia  - on lipitor  at home, restarted  lipid panel total cholesterol 166 HDL 56 LDL 74 triglyceride 178     Code status: Full code  Prophylaxis: SCD  Care Plan discussed with: And and family  Anticipated Disposition: TBD     Principal Problem:    Subarachnoid hemorrhage (HCC)  Active Problems:    Subarachnoid bleed (HCC)  Resolved Problems:    * No resolved hospital problems. *            Review of Systems:   A comprehensive review of systems was negative.         Vital Signs:    Last 24hrs VS reviewed since prior progress note. Most recent are:  BP (!) 144/74   Pulse 69   Temp 98.2 F (36.8 C) (Oral)   Resp 20   Ht 1.549 m (5' 1)   Wt 71.7 kg (158 lb)   SpO2 98%   BMI 29.85 kg/m        Intake/Output Summary (Last 24 hours) at 11/04/2022 1304  Last data filed at 11/03/2022 2000  Gross per 24 hour   Intake 100 ml   Output --   Net 100 ml        Physical Examination:     I had a face to face encounter with this patient and independently examined them on 11/04/2022 as outlined below:          General : alert x 3, awake, no acute distress,   HEENT: PEERL, EOMI, moist mucus membrane  Neck: supple, no JVD, no meningeal signs  Chest: Clear to auscultation bilaterally   CVS: S1 S2 heard, Capillary refill less than 2 seconds  Abd: soft/ non tender, non  distended, BS physiological,   Ext: no clubbing, no cyanosis, no edema, brisk 2+ DP pulses  Neuro/Psych: pleasant mood and affect, CN 2-12 grossly intact, sensory grossly within normal limit, Strength 5/5 in all extremities  Skin: warm            Data Review:    I personally reviewed  Image and labs    I have independently reviewed and interpreted patient's lab and all other diagnostic data    Notes reviewed from all clinical/nonclinical/nursing services involved in patient's clinical care. Care coordination discussions were held with appropriate clinical/nonclinical/ nursing providers based on care coordination needs.     Labs:     Recent Labs     11/03/22  0315 11/04/22  0659   WBC 14.8* 12.9*   HGB 13.9 15.6   HCT 40.8 44.6   PLT 302 324     Recent Labs     11/02/22  0502 11/02/22  1134 11/03/22  0315 11/04/22  0659   NA 136 138 139 138   K 3.8 3.6 3.6 3.6   CL 106 109* 109* 106   CO2 23 24 23 22    BUN 6 7 5* 7   MG 2.4  --  1.9 2.0   PHOS 3.1  --  3.4 4.0     Recent Labs     11/01/22  1320 11/02/22  0502   ALT 17 16   GLOB 3.2 3.2     Recent Labs     11/01/22  1320   INR 0.9      No results for input(s): TIBC in the last 72 hours.    Invalid input(s): FE, PSAT, FERR   No results found for: RBCF   No results for input(s): PH, PCO2, PO2 in the last 72 hours.  No results for input(s): CPK in the last 72 hours.    Invalid input(s): CPKMB, CKNDX, TROIQ  Lab Results   Component Value Date/Time    CHOL 166 11/04/2022 06:59 AM    HDL 56 11/04/2022 06:59 AM    LDL 74.4 11/04/2022 06:59 AM     No results found for: GLUCPOC        Medications Reviewed:     Current Facility-Administered Medications   Medication Dose Route Frequency    traMADol  (ULTRAM ) tablet 50 mg  50 mg Oral Q6H PRN    acetaminophen  (TYLENOL ) tablet 1,000 mg  1,000 mg Oral 3 times per day    lidocaine   4 % external patch 1 patch  1 patch TransDERmal Daily    sodium chloride  flush 0.9 % injection 5-40 mL  5-40 mL IntraVENous 2  times per day    sodium chloride  flush 0.9 % injection 5-40 mL  5-40 mL IntraVENous PRN    0.9 % sodium chloride  infusion   IntraVENous PRN    ondansetron  (ZOFRAN -ODT) disintegrating tablet 4 mg  4 mg Oral Q8H PRN    Or    ondansetron  (ZOFRAN ) injection 4 mg  4 mg IntraVENous Q6H PRN    magnesium  sulfate 1000 mg in dextrose  5% 100 mL IVPB  1,000 mg IntraVENous PRN    atorvastatin  (LIPITOR ) tablet 20 mg  20 mg Oral Daily    pantoprazole  (PROTONIX ) tablet 20 mg  20 mg Oral Daily    niMODipine  (NIMOTOP ) capsule 60 mg  60 mg Oral 6 times per day    0.9 % sodium chloride  infusion   IntraVENous Continuous    sodium chloride  flush 0.9 % injection 5-40 mL  5-40 mL IntraVENous 2 times per day    sodium chloride  flush 0.9 % injection 5-40 mL  5-40 mL IntraVENous PRN    0.9 % sodium chloride  infusion   IntraVENous PRN    butalbital -acetaminophen -caffeine  (FIORICET , ESGIC ) per tablet 1 tablet  1 tablet Oral Q6H PRN    metoclopramide  (REGLAN ) injection 10 mg  10 mg IntraVENous Q6H PRN     ______________________________________________________________________  EXPECTED LENGTH OF STAY: Unable to retrieve estimated LOS  ACTUAL LENGTH OF STAY:          3                 Jannetta Slot, MD

## 2022-11-04 NOTE — Progress Notes (Signed)
 Neurocritical Care Brief Progress Note:    Acute non-traumatic SAH, DSA 11/01/22 negative for vascular source. Hunt Hess 2, [facility] Grade 2    Physical Exam:  Gen: NAD, calm, cooperative  Neuro: A&Ox4. Follows commands. Speech clear. Affect normal. PERRL, 3 mm bilaterally. Blinks to threat. No disconjugate gaze present. EOMI. Face symmetric. Palate symmetric. Tongue midline. MAEs spontaneously. Strength 5/5 RUE/RLE/LUE. 4/5 LLE (baseline from previous surgery)Negative drift. Bulk and tone normal. No involuntary movements. Gait deferred.  Skin: Warm, dry, color appropriate for ethnicity.       2000: Pt seen at bedside on NSTU with spouse, Clotilda, present. Complains of increasing headache. Taking PRNs without significant improvement. Added Mg 2gm prior to PRN Fioricet /Reglan  as headache cocktail.     0550: Cont to have headaches. Neuro exam stable.     Plan:      - Day 3/21 of Nimotop  to prevent delayed cerebral ischemia  - Strict I&O, goal is euvolemia  - q 2 neuro checks during the day, OK for q4 at night while sleeping  - TCDs daily    - SBP goal 100-160  - PT/OT/SLP   - MRI Brain pending to further eval right frontal hypodensity on CT Head   - NPO at midnight for MRI w/anesthesia 10/4    Clotilda Barter, APRN - CNP  Neurocritical Care Nurse Practitioner  [phone]

## 2022-11-04 NOTE — Progress Notes (Signed)
 Spoke with Semy, pt's nurse, to let her know we would plan on getting MRI w/anesthesia tomorrow afternoon aroun 1:30 - 2pm. Due to pt's gastroparesis diagnosis she should be NPO after 12 am. She states she will pass it on.

## 2022-11-05 ENCOUNTER — Inpatient Hospital Stay
Admit: 2022-11-05 | Payer: PRIVATE HEALTH INSURANCE | Attending: Student in an Organized Health Care Education/Training Program | Primary: Primary Care

## 2022-11-05 ENCOUNTER — Inpatient Hospital Stay: Admit: 2022-11-05 | Payer: PRIVATE HEALTH INSURANCE | Primary: Primary Care

## 2022-11-05 LAB — VAS TRANSCRANIAL DOPPLER COMPLETE
Basilar Artery EDV: 26.8 cm/s
Basilar Artery EDV: 36.9 cm/s
Basilar Artery EDV: 36.9 cm/s
Basilar Artery Mean Vel: 41 cm/s
Basilar Artery Mean Vel: 50 cm/s
Basilar Artery Mean Vel: 54 cm/s
Basilar Artery PSV: 69.7 cm/s
Basilar Artery PSV: 76.4 cm/s
Basilar Artery PSV: 87.4 cm/s
Body Surface Area: 1.76 m2
Body Surface Area: 1.76 m2
Body Surface Area: 1.76 m2
Left ACA EDV: 37.4 cm/s
Left ACA EDV: 43.1 cm/s
Left ACA EDV: 50.6 cm/s
Left ACA Mean Velocity: 56.9 cm/s
Left ACA Mean Velocity: 57.3 cm/s
Left ACA Mean Velocity: 67.9 cm/s
Left ACA PSV: 102.4 cm/s
Left ACA PSV: 84.5 cm/s
Left ACA PSV: 97 cm/s
Left External ICA Mean Velocity: 31 cm/s
Left External ICA Mean Velocity: 32 cm/s
Left External ICA Mean Velocity: 33 cm/s
Left ICA EDV: 25.9 cm/s
Left ICA EDV: 31.9 cm/s
Left ICA EDV: 34.4 cm/s
Left ICA Mean Velocity: 39.1 cm/s
Left ICA Mean Velocity: 49.6 cm/s
Left ICA Mean Velocity: 56.9 cm/s
Left ICA PSV: 101.8 cm/s
Left ICA PSV: 65.4 cm/s
Left ICA PSV: 85 cm/s
Left ICA dist EDV: 18.8 cm/s
Left ICA dist EDV: 19.2 cm/s
Left ICA dist EDV: 19.6 cm/s
Left ICA dist PSV: 54.2 cm/s
Left ICA dist PSV: 57.2 cm/s
Left ICA dist PSV: 60.2 cm/s
Left Lindegaard Ratio: 2.2
Left Lindegaard Ratio: 2.5
Left Lindegaard Ratio: 2.6
Left MCA 1 EDV: 50.9 cm/s
Left MCA 1 EDV: 57.8 cm/s
Left MCA 1 EDV: 58.7 cm/s
Left MCA 1 Mean Velocity: 73.4 cm/s
Left MCA 1 Mean Velocity: 78.5 cm/s
Left MCA 1 Mean Velocity: 83.5 cm/s
Left MCA 1 PSV: 118.3 cm/s
Left MCA 1 PSV: 120 cm/s
Left MCA 1 PSV: 133.1 cm/s
Left PCA 1 EDV: 33.1 cm/s
Left PCA 1 EDV: 42.4 cm/s
Left PCA 1 EDV: 54.3 cm/s
Left PCA 1 Mean Velocity: 49.5 cm/s
Left PCA 1 Mean Velocity: 61 cm/s
Left PCA 1 Mean Velocity: 82.9 cm/s
Left PCA 1 PSV: 140 cm/s
Left PCA 1 PSV: 82.3 cm/s
Left PCA 1 PSV: 98.3 cm/s
Left Vertebral EDV: 21.3 cm/s
Left Vertebral EDV: 22.6 cm/s
Left Vertebral EDV: 30.3 cm/s
Left Vertebral Mean Velocity: 30.1 cm/s
Left Vertebral Mean Velocity: 36.9 cm/s
Left Vertebral Mean Velocity: 43.1 cm/s
Left Vertebral PSV: 47.8 cm/s
Left Vertebral PSV: 65.4 cm/s
Left Vertebral PSV: 68.8 cm/s
Right ACA EDV: 31 cm/s
Right ACA EDV: 47.4 cm/s
Right ACA EDV: 48.8 cm/s
Right ACA Mean Velocity: 47.8 cm/s
Right ACA Mean Velocity: 71.2 cm/s
Right ACA Mean Velocity: 73.8 cm/s
Right ACA PSV: 116.1 cm/s
Right ACA PSV: 126.6 cm/s
Right ACA PSV: 81.5 cm/s
Right External ICA Mean Velocity: 30 cm/s
Right External ICA Mean Velocity: 30 cm/s
Right External ICA Mean Velocity: 32 cm/s
Right ICA EDV: 24.9 cm/s
Right ICA EDV: 30.6 cm/s
Right ICA EDV: 50.2 cm/s
Right ICA Mean Velocity: 38.6 cm/s
Right ICA Mean Velocity: 47 cm/s
Right ICA Mean Velocity: 71.8 cm/s
Right ICA PSV: 114.9 cm/s
Right ICA PSV: 66 cm/s
Right ICA PSV: 79.8 cm/s
Right ICA dist EDV: 19.6 cm/s
Right ICA dist EDV: 19.8 cm/s
Right ICA dist EDV: 20.6 cm/s
Right ICA dist PSV: 50.4 cm/s
Right ICA dist PSV: 50.5 cm/s
Right ICA dist PSV: 53.5 cm/s
Right Lindegaard Ratio: 2
Right Lindegaard Ratio: 2.3
Right Lindegaard Ratio: 2.4
Right MCA 1 EDV: 38.4 cm/s
Right MCA 1 EDV: 48.7 cm/s
Right MCA 1 EDV: 54.3 cm/s
Right MCA 1 Mean Velocity: 60.8 cm/s
Right MCA 1 Mean Velocity: 72.9 cm/s
Right MCA 1 Mean Velocity: 75.3 cm/s
Right MCA 1 PSV: 105.7 cm/s
Right MCA 1 PSV: 117.4 cm/s
Right MCA 1 PSV: 121.3 cm/s
Right PCA 1 EDV: 50 cm/s
Right PCA 1 EDV: 66.5 cm/s
Right PCA 1 EDV: 77.7 cm/s
Right PCA 1 Mean Velocity: 108.4 cm/s
Right PCA 1 Mean Velocity: 67.7 cm/s
Right PCA 1 Mean Velocity: 88.1 cm/s
Right PCA 1 PSV: 103.1 cm/s
Right PCA 1 PSV: 131.3 cm/s
Right PCA 1 PSV: 169.9 cm/s
Right Vertebral EDV: 23.5 cm/s
Right Vertebral EDV: 31.3 cm/s
Right Vertebral EDV: 33.6 cm/s
Right Vertebral Mean Velocity: 35.4 cm/s
Right Vertebral Mean Velocity: 45 cm/s
Right Vertebral Mean Velocity: 47.2 cm/s
Right Vertebral PSV: 59.1 cm/s
Right Vertebral PSV: 72.4 cm/s
Right Vertebral PSV: 74.3 cm/s

## 2022-11-05 LAB — CBC WITH AUTO DIFFERENTIAL
Basophils %: 1 % (ref 0–1)
Basophils Absolute: 0.1 10*3/uL (ref 0.0–0.1)
Eosinophils %: 2 % (ref 0–7)
Eosinophils Absolute: 0.2 10*3/uL (ref 0.0–0.4)
Hematocrit: 40.5 % (ref 35.0–47.0)
Hemoglobin: 14.6 g/dL (ref 11.5–16.0)
Immature Granulocytes %: 0 % (ref 0.0–0.5)
Immature Granulocytes Absolute: 0 10*3/uL (ref 0.00–0.04)
Lymphocytes %: 37 % (ref 12–49)
Lymphocytes Absolute: 4 10*3/uL — ABNORMAL HIGH (ref 0.8–3.5)
MCH: 32.9 pg (ref 26.0–34.0)
MCHC: 36 g/dL (ref 30.0–36.5)
MCV: 91.2 FL (ref 80.0–99.0)
MPV: 10.1 FL (ref 8.9–12.9)
Monocytes %: 7 % (ref 5–13)
Monocytes Absolute: 0.7 10*3/uL (ref 0.0–1.0)
Neutrophils %: 53 % (ref 32–75)
Neutrophils Absolute: 5.7 10*3/uL (ref 1.8–8.0)
Nucleated RBCs: 0 /100{WBCs}
Platelets: 333 10*3/uL (ref 150–400)
RBC: 4.44 M/uL (ref 3.80–5.20)
RDW: 11.5 % (ref 11.5–14.5)
WBC: 10.6 10*3/uL (ref 3.6–11.0)
nRBC: 0 10*3/uL (ref 0.00–0.01)

## 2022-11-05 LAB — COMPREHENSIVE METABOLIC PANEL
ALT: 19 U/L (ref 12–78)
AST: 13 U/L — ABNORMAL LOW (ref 15–37)
Albumin/Globulin Ratio: 1.3 (ref 1.1–2.2)
Albumin: 3.7 g/dL (ref 3.5–5.0)
Alk Phosphatase: 70 U/L (ref 45–117)
Anion Gap: 2 mmol/L (ref 2–12)
BUN/Creatinine Ratio: 15 (ref 12–20)
BUN: 13 mg/dL (ref 6–20)
CO2: 31 mmol/L (ref 21–32)
Calcium: 9 mg/dL (ref 8.5–10.1)
Chloride: 104 mmol/L (ref 97–108)
Creatinine: 0.85 mg/dL (ref 0.55–1.02)
Est, Glom Filt Rate: 87 mL/min/{1.73_m2} (ref >60)
Globulin: 2.9 g/dL (ref 2.0–4.0)
Glucose: 102 mg/dL — ABNORMAL HIGH (ref 65–100)
Potassium: 3.8 mmol/L (ref 3.5–5.1)
Sodium: 137 mmol/L (ref 136–145)
Total Bilirubin: 0.3 mg/dL (ref 0.2–1.0)
Total Protein: 6.6 g/dL (ref 6.4–8.2)

## 2022-11-05 LAB — MAGNESIUM: Magnesium: 2.4 mg/dL (ref 1.6–2.4)

## 2022-11-05 MED ORDER — LORAZEPAM 2 MG/ML IJ SOLN
2 | Freq: Four times a day (QID) | INTRAMUSCULAR | Status: DC | PRN
Start: 2022-11-05 — End: 2022-11-06
  Administered 2022-11-05: 21:00:00 2 mg via INTRAVENOUS

## 2022-11-05 MED ORDER — LACTATED RINGERS IV SOLN
INTRAVENOUS | Status: DC
Start: 2022-11-05 — End: 2022-11-06

## 2022-11-05 MED ORDER — MAGNESIUM SULFATE 2000 MG/50 ML IVPB PREMIX
2 | Freq: Once | INTRAVENOUS | Status: AC
Start: 2022-11-05 — End: 2022-11-05
  Administered 2022-11-05: 05:00:00 2000 mg via INTRAVENOUS

## 2022-11-05 MED ORDER — GADOTERIDOL 279.3 MG/ML IV SOLN
279.3 | Freq: Once | INTRAVENOUS | Status: AC | PRN
Start: 2022-11-05 — End: 2022-11-05
  Administered 2022-11-05: 21:00:00 15 mL via INTRAVENOUS

## 2022-11-05 MED ORDER — LACTATED RINGERS IV BOLUS
Freq: Once | INTRAVENOUS | Status: AC
Start: 2022-11-05 — End: 2022-11-05
  Administered 2022-11-05: 15:00:00 1000 mL via INTRAVENOUS

## 2022-11-05 MED FILL — ATORVASTATIN CALCIUM 40 MG PO TABS: 40 MG | ORAL | Qty: 1

## 2022-11-05 MED FILL — NIMODIPINE 30 MG PO CAPS: 30 MG | ORAL | Qty: 2

## 2022-11-05 MED FILL — BUTALBITAL-APAP-CAFFEINE 50-325-40 MG PO TABS: 50-325-40 MG | ORAL | Qty: 1

## 2022-11-05 MED FILL — LACTATED RINGERS IV SOLN: INTRAVENOUS | Qty: 1000

## 2022-11-05 MED FILL — ACETAMINOPHEN EXTRA STRENGTH 500 MG PO TABS: 500 MG | ORAL | Qty: 2

## 2022-11-05 MED FILL — LORAZEPAM 2 MG/ML IJ SOLN: 2 MG/ML | INTRAMUSCULAR | Qty: 1

## 2022-11-05 MED FILL — METOCLOPRAMIDE HCL 5 MG/ML IJ SOLN: 5 MG/ML | INTRAMUSCULAR | Qty: 2

## 2022-11-05 MED FILL — TRAMADOL HCL 50 MG PO TABS: 50 MG | ORAL | Qty: 1

## 2022-11-05 MED FILL — MAGNESIUM SULFATE 2 GM/50ML IV SOLN: 2 GM/50ML | INTRAVENOUS | Qty: 50

## 2022-11-05 MED FILL — PANTOPRAZOLE SODIUM 20 MG PO TBEC: 20 MG | ORAL | Qty: 1

## 2022-11-05 MED FILL — LIDOCAINE PAIN RELIEF 4 % EX PTCH: 4 % | CUTANEOUS | Qty: 1

## 2022-11-05 NOTE — Progress Notes (Addendum)
 Hospitalist Progress Note  Jannetta Slot, MD  Answering service: [phone] OR [phone] from in house phone        Date of Service:  11/05/2022  NAME:  Christina Stevens  DOB:  March 31, 1980  MRN:  [id number]      Admission Summary:   Christina Stevens is a 42 y.o. female who initially presented to [facility] with a chief complaint of headache and difficulty speaking.  Her headache reportedly started 6 days prior to admission, reported as severe at onset, waking her from sleep.  Headache medications did not provide any relief.  On 9/30, the pain became worse and had an episode of syncope ~10 AM. Last known well time was a 6:30 AM, she had spoken with a friend and at that time her speech was normal. Her wife spoke with her ~9 AM and her speech was normal then as well.  After the syncopa; episode, her speech was slurred and she had difficulty getting words out. Reported feeling weak and shaky but weaker on the left side (which had worsened over 2 days PTA).  CT of the head showed a small acute SAH in the basilar cisterns. Patient was transferred to [facility] 9/30 and underwent a diagnostic cerebral angiogram with no discovery of source for [facility] identified.        Interval history / Subjective:   Patient seen and examined family at bedside  Had a fall in AM CT head normal  Awaiting MRI NIS following pt nimotop  4/21    Addendum:    Patient will not get MRI without anesthesia and anesthesiologist refused to get the anesthesia done so MRI was discontinued if patient willing in future can get MRI with Ativan     Discussed with neurointerventional Dr. Massie who will be on-call on the weekend as well  Patient is still has vasospasm should be on IV fluid continue to monitor I's and O's    If patient wants to leave the hospital has to be signed out AMA     Assessment & Plan:     Acute nontraumatic subarachnoid hemorrhage    - CT head 9/30:  Small acute subarachnoid hemorrhage in the basilar cisterns   - CTA head/neck 9/30: No intracranial aneurysm or other focal vascular abnormality accounting for subarachnoid hemorrhage.  - Underwent a cerebral angiogram on 9/30 with no source of SAH identified  - CT head 10/1: New, small peripheral hypodensity in the right frontal lobe, concerning for acute infarction. Small amount of acute subarachnoid hemorrhage in the basilar cisterns, not  significantly changed. No new hemorrhage  - MRI has been ordered see below  - Echo: normal LVEF, EF 60-65% normal diastolic function.   - A1c 5.2,   - PT/OT  - Day 4 / 21 of Nimotop   - CT head fater fall negative for bleed 10/4  -TCD's daily goal blood pressure systolic 160 PT/OT   -- Patient refusing MRI without anesthesia and anesthesia refused to do     ADHD  - takes Adderall at home     Hx Hyperlipidemia  - on lipitor  at home, restarted  lipid panel total cholesterol 166 HDL 56 LDL 74 triglyceride 178     Code status: Full code  Prophylaxis: SCD  Care Plan discussed with: And and family  Anticipated Disposition: TBD     Principal Problem:    Subarachnoid hemorrhage (HCC)  Active Problems:    Subarachnoid bleed (HCC)  Resolved Problems:    * No resolved  hospital problems. *            Review of Systems:   A comprehensive review of systems was negative.         Vital Signs:    Last 24hrs VS reviewed since prior progress note. Most recent are:  BP 126/89   Pulse 62   Temp 97.9 F (36.6 C) (Oral)   Resp 18   Ht 1.549 m (5' 1)   Wt 71.7 kg (158 lb)   SpO2 100%   BMI 29.85 kg/m      No intake or output data in the 24 hours ending 11/05/22 1016       Physical Examination:     I had a face to face encounter with this patient and independently examined them on 11/05/2022 as outlined below:          General : alert x 3, awake, no acute distress,   HEENT: PEERL, EOMI, moist mucus membrane  Neck: supple, no JVD, no meningeal signs  Chest: Clear to auscultation bilaterally    CVS: S1 S2 heard, Capillary refill less than 2 seconds  Abd: soft/ non tender, non distended, BS physiological,   Ext: no clubbing, no cyanosis, no edema, brisk 2+ DP pulses  Neuro/Psych: pleasant mood and affect, CN 2-12 grossly intact, sensory grossly within normal limit, Strength 5/5 in all extremities  Skin: warm            Data Review:    I personally reviewed  Image and labs    I have independently reviewed and interpreted patient's lab and all other diagnostic data    Notes reviewed from all clinical/nonclinical/nursing services involved in patient's clinical care. Care coordination discussions were held with appropriate clinical/nonclinical/ nursing providers based on care coordination needs.     Labs:     Recent Labs     11/03/22  0315 11/04/22  0659   WBC 14.8* 12.9*   HGB 13.9 15.6   HCT 40.8 44.6   PLT 302 324     Recent Labs     11/02/22  1134 11/03/22  0315 11/04/22  0659   NA 138 139 138   K 3.6 3.6 3.6   CL 109* 109* 106   CO2 24 23 22    BUN 7 5* 7   MG  --  1.9 2.0   PHOS  --  3.4 4.0     No results for input(s): ALT, TP, GLOB, GGT in the last 72 hours.    Invalid input(s): SGOT, GPT, AP, TBIL, TBILI, ALB, AML, AMYP, LPSE, HLPSE    No results for input(s): INR, APTT in the last 72 hours.    Invalid input(s): PTP     No results for input(s): TIBC in the last 72 hours.    Invalid input(s): FE, PSAT, FERR   No results found for: RBCF   No results for input(s): PH, PCO2, PO2 in the last 72 hours.  No results for input(s): CPK in the last 72 hours.    Invalid input(s): CPKMB, CKNDX, TROIQ  Lab Results   Component Value Date/Time    CHOL 166 11/04/2022 06:59 AM    HDL 56 11/04/2022 06:59 AM    LDL 74.4 11/04/2022 06:59 AM     No results found for: GLUCPOC        Medications Reviewed:     Current Facility-Administered Medications   Medication Dose Route Frequency    lactated ringers  bolus 1,000 mL  1,000 mL  IntraVENous Once    lactated ringers   IV soln infusion   IntraVENous Continuous    traMADol  (ULTRAM ) tablet 50 mg  50 mg Oral Q6H PRN    acetaminophen  (TYLENOL ) tablet 1,000 mg  1,000 mg Oral 3 times per day    lidocaine  4 % external patch 1 patch  1 patch TransDERmal Daily    sodium chloride  flush 0.9 % injection 5-40 mL  5-40 mL IntraVENous 2 times per day    sodium chloride  flush 0.9 % injection 5-40 mL  5-40 mL IntraVENous PRN    0.9 % sodium chloride  infusion   IntraVENous PRN    ondansetron  (ZOFRAN -ODT) disintegrating tablet 4 mg  4 mg Oral Q8H PRN    Or    ondansetron  (ZOFRAN ) injection 4 mg  4 mg IntraVENous Q6H PRN    magnesium  sulfate 1000 mg in dextrose  5% 100 mL IVPB  1,000 mg IntraVENous PRN    atorvastatin  (LIPITOR ) tablet 20 mg  20 mg Oral Daily    pantoprazole  (PROTONIX ) tablet 20 mg  20 mg Oral Daily    niMODipine  (NIMOTOP ) capsule 60 mg  60 mg Oral 6 times per day    sodium chloride  flush 0.9 % injection 5-40 mL  5-40 mL IntraVENous 2 times per day    sodium chloride  flush 0.9 % injection 5-40 mL  5-40 mL IntraVENous PRN    0.9 % sodium chloride  infusion   IntraVENous PRN    butalbital -acetaminophen -caffeine  (FIORICET , ESGIC ) per tablet 1 tablet  1 tablet Oral Q6H PRN    metoclopramide  (REGLAN ) injection 10 mg  10 mg IntraVENous Q6H PRN     ______________________________________________________________________  EXPECTED LENGTH OF STAY: Unable to retrieve estimated LOS  ACTUAL LENGTH OF STAY:          4                 Jannetta Slot, MD

## 2022-11-05 NOTE — Plan of Care (Signed)
 Problem: Discharge Planning  Goal: Discharge to home or other facility with appropriate resources  Outcome: Progressing  Flowsheets (Taken 11/04/2022 2038)  Discharge to home or other facility with appropriate resources:   Identify barriers to discharge with patient and caregiver   Arrange for needed discharge resources and transportation as appropriate   Identify discharge learning needs (meds, wound care, etc)   Refer to discharge planning if patient needs post-hospital services based on physician order or complex needs related to functional status, cognitive ability or social support system     Problem: Pain  Goal: Verbalizes/displays adequate comfort level or baseline comfort level  Outcome: Progressing     Problem: Safety - Adult  Goal: Free from fall injury  Outcome: Progressing     Problem: ABCDS Injury Assessment  Goal: Absence of physical injury  Outcome: Progressing     Problem: Physical Therapy - Adult  Goal: By Discharge: Performs mobility at highest level of function for planned discharge setting.  See evaluation for individualized goals.  Description: FUNCTIONAL STATUS PRIOR TO ADMISSION: Patient was independent and active without use of DME. Worked full time as an airline pilot.     HOME SUPPORT PRIOR TO ADMISSION: The patient lived with her wife and two teenage children but did not require assistance.    Physical Therapy Goals  Initiated 11/02/2022  1.  Patient will move from supine to sit and sit to supine and roll side to side in bed with independence within 7 day(s).    2.  Patient will perform sit to stand with independence within 7 day(s).  3.  Patient will transfer from bed to chair and chair to bed with independence using the least restrictive device within 7 day(s).  4.  Patient will ambulate with independence for 500 feet with the least restrictive device within 7 day(s).   5.  Patient will ascend/descend 14 stairs with one handrail(s) with supervision/set-up within 7 day(s).  6.  Patient will  improve Berg Balance score by 4 points within 7 days.   11/04/2022 1517 by Jarvis Blanc, PT  Outcome: Progressing

## 2022-11-05 NOTE — Progress Notes (Signed)
 Neurointerventional Surgery Progress Note  Neurocritical Care NP    Admit Date: 11/01/2022   LOS: 4 days      Daily Progress Note: 11/05/2022    Assessment:     Acute non-traumatic SAH, DSA 11/01/22 negative for vascular source. Hunt Hess 2, Fisher Grade 2    Plan:                 - Day 4/21 of Nimotop  to prevent delayed cerebral ischemia              - Strict I&O, goal is euvolemia              - q 2 neuro checks during the day              - TCDs daily              - SBP goal 100-160              - PT/OT/SLP   - MRI Brain pending to further eval right frontal hypodensity on CT Head, has to be done with sedation     1000 ml LR bolus, followed up LR 100 ml/hr ordered this am to maitain euvolemia given pt is NPO for MRI today and was NPO yesterday for MRI, although scan was not able to be obtained. TCDs showing increased vasospasm today, discussed importance of euvolemia with primary RN.     Plan d/w Dr. Massie. Pt mother at bedside updated on plan of care.     HPI: Christina Stevens is a 42 y.o.  White (non-Hispanic)  female with a pmh of PCOS, peripheral neuropathy, and ADHD who presented to [facility] ED 11/01/22 reporting a severe headache x6 days with slurred speech and stating she passed out .  A Code Stroke was called and a stat CT head was obtained which showed acute SAH. CTA head/neck was then obtained which was negative for a vascular source. BP noted to be 179/102 on arrival to ED. NSGY was consulted and recommended NIS consult. Pt was then transferred to [facility] ICU for higher level of care. Pt wife at bedside reports pt takes adderall for ADHD, last took this yesterday. She reports the pt has a prescription for medical marijuana and smokes marijuana 3 times per day every single day. She denies pt using any street drugs and she states the pt does not drink any alcohol.      Pt follows with [facility], was last seen 09/27/22. Per notes, pt has had left sided weakness since 2019 peripheral neuropathy, foot drop,  balance and gait disturbance, and memory issues that started after a prolonged hospitalization for sepsis. She underwent EMG, MRI brain, C-spine, T-spine, lumbar spine as part of the work up. 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    Subjective:     No neuro events overnight. Headache are worse this am and pt is more irritable as she is hungry and tired of being NPO. Pt and family anxious for MRI to be completed today.     Current Facility-Administered Medications   Medication Dose Route Frequency Provider Last Rate Last Admin    traMADol  (ULTRAM ) tablet 50 mg  50 mg Oral Q6H PRN Olivia Hahn, MD   50 mg at 11/05/22 0557    acetaminophen  (TYLENOL ) tablet 1,000 mg  1,000 mg Oral 3 times per day Heard, Courtney S, APRN - CNP   1,000 mg  at 11/04/22 2245    lidocaine  4 % external patch 1 patch  1 patch TransDERmal Daily Ballagh, Eva A, APRN - NP   1 patch at 11/04/22 2109    sodium chloride  flush 0.9 % injection 5-40 mL  5-40 mL IntraVENous 2 times per day Effie Sor, APRN - CNP   10 mL at 11/04/22 2108    sodium chloride  flush 0.9 % injection 5-40 mL  5-40 mL IntraVENous PRN Effie Sor, APRN - CNP        0.9 % sodium chloride  infusion   IntraVENous PRN Effie Sor, APRN - CNP   New Bag at 11/01/22 1653    ondansetron  (ZOFRAN -ODT) disintegrating tablet 4 mg  4 mg Oral Q8H PRN Effie Sor, APRN - CNP        Or    ondansetron  (ZOFRAN ) injection 4 mg  4 mg IntraVENous Q6H PRN Effie Sor, APRN - CNP   4 mg at 11/03/22 0419    magnesium  sulfate 1000 mg in dextrose  5% 100 mL IVPB  1,000 mg IntraVENous PRN Effie Sor, APRN - CNP        atorvastatin  (LIPITOR ) tablet 20 mg  20 mg Oral Daily Tanda Lonni SAUNDERS, MD   20 mg at 11/04/22 9088    pantoprazole  (PROTONIX ) tablet 20 mg  20 mg Oral Daily Tanda Lonni SAUNDERS, MD   20 mg at 11/04/22 9085    niMODipine  (NIMOTOP ) capsule 60 mg  60 mg Oral 6 times per day Junita Jesslyn HERO, APRN  - NP   60 mg at 11/05/22 0358    sodium chloride  flush 0.9 % injection 5-40 mL  5-40 mL IntraVENous 2 times per day Junita Jesslyn HERO, APRN - NP   10 mL at 11/04/22 2108    sodium chloride  flush 0.9 % injection 5-40 mL  5-40 mL IntraVENous PRN Binford, Jesslyn HERO, APRN - NP        0.9 % sodium chloride  infusion   IntraVENous PRN Binford, Travia M, APRN - NP        butalbital -acetaminophen -caffeine  (FIORICET , ESGIC ) per tablet 1 tablet  1 tablet Oral Q6H PRN Rosalba Elyn LABOR, APRN - NP   1 tablet at 11/05/22 0306    metoclopramide  (REGLAN ) injection 10 mg  10 mg IntraVENous Q6H PRN Rosalba Elyn LABOR, APRN - NP   10 mg at 11/05/22 0308     Allergies   Allergen Reactions    Decadron [Dexamethasone] Swelling and Anxiety    Adhesive Tape Rash    Chlorhexidine Itching    Oxycodone  Swelling    Oxycodone -Acetaminophen  Itching     Review of Systems:  Pertinent items are noted in the History of Present Illness.    Objective:     Vital signs  Temp (24hrs), Avg:98 F (36.7 C), Min:97.5 F (36.4 C), Max:98.2 F (36.8 C)   No intake/output data recorded.  10/02 1901 - 10/04 0700  In: 100 [P.O.:100]  Out: -     BP 126/89   Pulse 62   Temp 97.9 F (36.6 C) (Oral)   Resp 18   Ht 1.549 m (5' 1)   Wt 71.7 kg (158 lb)   SpO2 100%   BMI 29.85 kg/m          No intake or output data in the 24 hours ending 11/05/22 0849     Physical Exam:  Gen: NAD, calm, cooperative  Skin: Warm, dry, color appropriate for ethnicity.     Neurologic Exam:  Mental Status:  Alert and oriented x 4.  Appropriate affect, mood and behavior.       Speech and Language:      Clear, Normal fluency, repetition, comprehension and naming. Follows commands.     Cranial Nerves:     Pupils 3 mm, equal, round and briskly reactive to light.  Visual fields full to confrontation.  Extraocular movements intact.    Facial sensation intact.  Full facial strength, no asymmetry.   Hearing intact bilaterally.  No dysarthria. Tongue protrudes to midline, palate elevates  symmetrically.    Shoulder shrug 5/5 bilaterally.    Motor:      No pronator drift. 5/5 power in right extremities. Left leg 4/5 strength (baseline per pt from knee surgery)  Bulk and tone normal.   No involuntary movements.    Sensation:      Sensation mildly diminished in left lower leg (baseline per pt from prior surgery)  Parietal function intact.    Coordination:  FTN, HTS intact.    Gait:   Deferred.      24 hour results:  Recent Results (from the past 24 hour(s))   Vascular transcranial Doppler (TCD) complete    Collection Time: 11/05/22  5:52 AM   Result Value Ref Range    Right MCA 1 PSV 117.4 cm/s    Right MCA 1 EDV 54.3 cm/s    Right ACA PSV 126.6 cm/s    Right ACA EDV 47.4 cm/s    Right ICA PSV 79.8 cm/s    Right ICA EDV 30.6 cm/s    Right PCA 1 PSV 169.9 cm/s    Right PCA 1 EDV 77.7 cm/s    Right Vertebral PSV 72.4 cm/s    Right Vertebral EDV 31.3 cm/s    Left MCA 1 PSV 133.1 cm/s    Left MCA 1 EDV 58.7 cm/s    Left ACA PSV 102.4 cm/s    Left ACA EDV 50.6 cm/s    Left ICA PSV 85.0 cm/s    Left ICA EDV 31.9 cm/s    Left PCA 1 PSV 140.0 cm/s    Left PCA 1 EDV 54.3 cm/s    Left Vertebral PSV 47.8 cm/s    Left Vertebral EDV 21.3 cm/s    Basilar Artery PSV 69.7 cm/s    Basilar Artery EDV 26.8 cm/s    Right ICA dist PSV 53.5 cm/s    Right ICA dist EDV 20.6 cm/s    Left ICA dist PSV 57.2 cm/s    Left ICA dist EDV 18.8 cm/s     Imaging (personally reviewed by myself):  CT head 11/01/22: Acute SAH  CTA head/neck 11/01/22: Negative for vascular source of hemorrhage.  ECHO showed EF 60-65 %, LV size normal, LA size normal     TCD 11/02/22: No evidence of intracranial arterial vasospasm.   TCD 11/03/22: Possible right PCA vasospasm.   TCD 11/04/22: Right PCA vasospasm and possible left PCA vasospasm   TCD 11/05/22: Bilateral PCA vasospasm, right greater than left, increased in comparison to yesterday's exam.     I have spent 35 minutes of time involved in chart review, lab review, imaging review, consultations with  specialists and attendings, family discussion/decision making and documentation.     Signed By: Tawni Skeeters, APRN - NP, Neurocritical Care Nurse Practitioner    November 05, 2022

## 2022-11-05 NOTE — Plan of Care (Signed)
Problem: Pain  Goal: Verbalizes/displays adequate comfort level or baseline comfort level  Outcome: Not Progressing     Problem: Discharge Planning  Goal: Discharge to home or other facility with appropriate resources  Outcome: Progressing  Flowsheets (Taken 11/05/2022 0800)  Discharge to home or other facility with appropriate resources:   Identify barriers to discharge with patient and caregiver   Arrange for needed discharge resources and transportation as appropriate   Identify discharge learning needs (meds, wound care, etc)   Arrange for interpreters to assist at discharge as needed   Refer to discharge planning if patient needs post-hospital services based on physician order or complex needs related to functional status, cognitive ability or social support system     Problem: Safety - Adult  Goal: Free from fall injury  Outcome: Progressing     Problem: ABCDS Injury Assessment  Goal: Absence of physical injury  Outcome: Progressing     Problem: Pain  Goal: Verbalizes/displays adequate comfort level or baseline comfort level  Outcome: Not Progressing

## 2022-11-05 NOTE — Care Coordination (Signed)
 Transition of Care Plan:      Home when stable  - OP PT/OT recommended     Transport: Family     RUR: 6%  Prior Level of Functioning: Independent   Disposition: Home, OP therapy  Follow up appointments: PCP, NIS  DME needed: none  Transportation at discharge: family   Caregiver Contact: Christina, Stevens (Spouse)  [phone] (Mobile)   Discharge Caregiver contacted prior to discharge?   Care Conference needed? No  Barriers to discharge: Medical, >48 hours - NIS day 4/21 Nimotop ; MRI pending    CM will follow.    Christina Stevens) Sheryle, M.S.W.

## 2022-11-05 NOTE — Progress Notes (Signed)
 Neurocritical Care Brief Progress Note:    Notified by hospitalist NP that patient was requesting to leave AMA following her MRI. Hospitalist Attending was present to speak to patient who understood risks associated and despite medical staff concerns, still wanted to leave. Per nursing documentation, pt left at 2110. Dr Massie with NIS aware.       Christina Barter, APRN - CNP  Neurocritical Care Nurse Practitioner  [phone]

## 2022-11-05 NOTE — Progress Notes (Signed)
 Brief NIS Note    Per the anesthesiologist, GA is not indicated nor necessary for this patient to undergo MRI, as the patient has had MRIs in the past without GA and there is no clinical indication for the GA.  I discussed with the patient. She was very upset and said that she will not have the MRI done without GA. I proposed attempting it with some light sedation, but she refused. Patient was very rude and uncooperative. Since she is refusing the MRI, we will cancel the order and resume normal diet.

## 2022-11-05 NOTE — Progress Notes (Signed)
 2040 During shift report pt expressed to both nurses her wish to leave tonight since MRI is completed as she states she was informed by MD that she could be discharged after. Pt made aware that if she leaves it will be considered AMA per MD notes as she has not been cleared for discharge. Pt verbalizes understanding but maintains wish to leave tonight. NP messaged about above.  2110 MD came to talk to patient. Pt anxious and argumentative and despite education on the potential harm of leaving AMA pt signed form and left unit ambulatory with pt's father. IV removed and all belongings taken.

## 2022-11-05 NOTE — Progress Notes (Signed)
 I spoke with the patient over the phone and also met her at the bedside at the request of the patient's nurse.  Patient is very adamant about leaving the hospital.  She was admitted for evaluation and treatment of acute nontraumatic subarachnoid hemorrhage.  Patient also has significant anxiety as well as ADHD.  MRI of the brain done today and result is not available.  The risk of leaving the hospital AMA which include but not limited to worsening subdural arachnoid hemorrhage and death explained to patient and verbalized understanding.  Patient left AMA.  Attending physician to be notified in the morning.

## 2022-11-06 MED FILL — LIDOCAINE PAIN RELIEF 4 % EX PTCH: 4 % | CUTANEOUS | Qty: 1

## 2022-11-06 MED FILL — NIMODIPINE 30 MG PO CAPS: 30 MG | ORAL | Qty: 2

## 2022-11-09 ENCOUNTER — Emergency Department: Admit: 2022-11-09 | Payer: PRIVATE HEALTH INSURANCE | Primary: Primary Care

## 2022-11-09 ENCOUNTER — Inpatient Hospital Stay
Admission: EM | Admit: 2022-11-09 | Discharge: 2022-11-11 | Disposition: A | Discharge status: Home / Self Care | Payer: PRIVATE HEALTH INSURANCE | Admitting: Family Medicine

## 2022-11-09 DIAGNOSIS — I639 Cerebral infarction, unspecified: Secondary | ICD-10-CM

## 2022-11-09 DIAGNOSIS — I6340 Cerebral infarction due to embolism of unspecified cerebral artery: Principal | ICD-10-CM

## 2022-11-09 LAB — COMPREHENSIVE METABOLIC PANEL
ALT: 28 U/L (ref 12–78)
AST: 21 U/L (ref 15–37)
Albumin/Globulin Ratio: 1.1 (ref 1.1–2.2)
Albumin: 3.8 g/dL (ref 3.5–5.0)
Alk Phosphatase: 65 U/L (ref 45–117)
Anion Gap: 3 mmol/L (ref 2–12)
BUN/Creatinine Ratio: 11 — ABNORMAL LOW (ref 12–20)
BUN: 9 mg/dL (ref 6–20)
CO2: 31 mmol/L (ref 21–32)
Calcium: 9.2 mg/dL (ref 8.5–10.1)
Chloride: 104 mmol/L (ref 97–108)
Creatinine: 0.79 mg/dL (ref 0.55–1.02)
Est, Glom Filt Rate: >90 mL/min/{1.73_m2} (ref >60)
Globulin: 3.5 g/dL (ref 2.0–4.0)
Glucose: 92 mg/dL (ref 65–100)
Potassium: 3.8 mmol/L (ref 3.5–5.1)
Sodium: 138 mmol/L (ref 136–145)
Total Bilirubin: 0.2 mg/dL (ref 0.2–1.0)
Total Protein: 7.3 g/dL (ref 6.4–8.2)

## 2022-11-09 LAB — CBC WITH AUTO DIFFERENTIAL
Basophils %: 1 % (ref 0–1)
Basophils Absolute: 0.1 10*3/uL (ref 0.0–0.1)
Eosinophils %: 1 % (ref 0–7)
Eosinophils Absolute: 0.1 10*3/uL (ref 0.0–0.4)
Hematocrit: 41 % (ref 35.0–47.0)
Hemoglobin: 14.4 g/dL (ref 11.5–16.0)
Immature Granulocytes %: 0 %
Immature Granulocytes Absolute: 0 10*3/uL
Lymphocytes %: 26 % (ref 12–49)
Lymphocytes Absolute: 3.1 10*3/uL (ref 0.8–3.5)
MCH: 33.3 pg (ref 26.0–34.0)
MCHC: 35.1 g/dL (ref 30.0–36.5)
MCV: 94.7 FL (ref 80.0–99.0)
MPV: 9.7 FL (ref 8.9–12.9)
Monocytes %: 5 % (ref 5–13)
Monocytes Absolute: 0.6 10*3/uL (ref 0.0–1.0)
Neutrophils %: 67 % (ref 32–75)
Neutrophils Absolute: 8.1 10*3/uL — ABNORMAL HIGH (ref 1.8–8.0)
Nucleated RBCs: 0 /100{WBCs}
Platelets: 368 10*3/uL (ref 150–400)
RBC: 4.33 M/uL (ref 3.80–5.20)
RDW: 11.9 % (ref 11.5–14.5)
WBC Comment: REACTIVE
WBC: 12 10*3/uL — ABNORMAL HIGH (ref 3.6–11.0)
nRBC: 0 10*3/uL (ref 0.00–0.01)

## 2022-11-09 MED ORDER — HYDROMORPHONE 0.5MG/0.5ML IJ SOLN
1 | Status: DC
Start: 2022-11-09 — End: 2022-11-09

## 2022-11-09 MED ORDER — ACETAMINOPHEN 500 MG PO TABS
500 | Freq: Once | ORAL | Status: AC
Start: 2022-11-09 — End: 2022-11-09
  Administered 2022-11-09: 19:00:00 1000 mg via ORAL

## 2022-11-09 MED ORDER — HYDROMORPHONE HCL PF 1 MG/ML IJ SOLN
1 | INTRAMUSCULAR | Status: AC
Start: 2022-11-09 — End: 2022-11-09
  Administered 2022-11-09: 22:00:00 0.5 mg via INTRAVENOUS

## 2022-11-09 MED ORDER — METOCLOPRAMIDE HCL 5 MG/ML IJ SOLN
5 | Freq: Once | INTRAMUSCULAR | Status: AC
Start: 2022-11-09 — End: 2022-11-09
  Administered 2022-11-09: 19:00:00 5 mg via INTRAVENOUS

## 2022-11-09 MED ORDER — METHOCARBAMOL 500 MG PO TABS
500 | Freq: Once | ORAL | Status: AC
Start: 2022-11-09 — End: 2022-11-09
  Administered 2022-11-09: 19:00:00 1000 mg via ORAL

## 2022-11-09 MED ORDER — NIMODIPINE 30 MG PO CAPS
30 MG | ORAL | Status: AC
Start: 2022-11-09 — End: 2022-11-11
  Administered 2022-11-10 – 2022-11-11 (×12): 60 mg via ORAL

## 2022-11-09 MED ORDER — IOPAMIDOL 76 % IV SOLN
76 | Freq: Once | INTRAVENOUS | Status: AC | PRN
Start: 2022-11-09 — End: 2022-11-09
  Administered 2022-11-09: 22:00:00 100 mL via INTRAVENOUS

## 2022-11-09 MED FILL — ISOVUE-370 76 % IV SOLN: 76 % | INTRAVENOUS | Qty: 100

## 2022-11-09 MED FILL — METHOCARBAMOL 500 MG PO TABS: 500 MG | ORAL | Qty: 2

## 2022-11-09 MED FILL — METOCLOPRAMIDE HCL 5 MG/ML IJ SOLN: 5 MG/ML | INTRAMUSCULAR | Qty: 2

## 2022-11-09 MED FILL — HYDROMORPHONE HCL 1 MG/ML IJ SOLN: 1 MG/ML | INTRAMUSCULAR | Qty: 1

## 2022-11-09 MED FILL — ACETAMINOPHEN EXTRA STRENGTH 500 MG PO TABS: 500 MG | ORAL | Qty: 2

## 2022-11-09 MED FILL — NIMODIPINE 30 MG PO CAPS: 30 MG | ORAL | Qty: 2

## 2022-11-09 NOTE — ED Notes (Signed)
Bedside and Verbal shift change report given to Fallon (oncoming nurse) by Ellie (offgoing nurse). Report included the following information Nurse Handoff Report, ED Encounter Summary, ED SBAR, Intake/Output, MAR, and Recent Results.

## 2022-11-09 NOTE — Consults (Signed)
NeuroInterventional Surgery Consult  Christina Macho, APRN - NP    Patient: Christina Stevens MRN: [id number]  SSN: [id number]    Date of Birth: March 30, 1980  Age: 42 y.o.  Sex: female      Chief Complaint: Headache    HPI:   42 y.o.  White (non-Hispanic)  female consulted for [facility] and severe headache    Christina Stevens is a 42 y.o.  White (non-Hispanic)  female with a pmh of PCOS, peripheral neuropathy, Austism Spectrum Disorder and ADHD who was previously admitted on 11/01/22 for treatment of headache, SAH and CVA.  She left AMA on 11/05/22 and presents today for help with headache, complaints of emotional lability and outbursts of anger.  She initially presented to [facility] ED 11/01/22 reporting a severe headache x 6 days with slurred speech and stating she passed out .  A Code Stroke was called and a stat CT head was obtained which showed acute SAH. CTA head/neck was then obtained which was negative for a vascular source. BP noted to be 179/102 on arrival to ED. NSGY was consulted and recommended NIS consult. Pt was then transferred to [facility] ICU for higher level of care. Pt wife at bedside reported previously that pt takes adderall for ADHD. She reports the pt has a prescription for medical marijuana and smokes marijuana 3 times per day every single day. Patient denies using any street drugs or alcohol.     Patient follows with [facility], was last seen 09/27/22. Per notes, patient has had left sided weakness since 2019,  peripheral neuropathy, foot drop, balance and gait disturbance, and memory issues that started after a prolonged hospitalization for sepsis. She underwent EMG, MRI brain, C-spine, T-spine, lumbar spine as part of the work up. Per Neurology notes, these studies were "suggestive of a functional neurologic disorder,"  and was not felt to have MS.    Patient is seen with friend at bedside who is also a physical therapist and tries to work with her.  She is currently cooperative and calm.  I did review  the plan for diagnostic cerebral angiogram with Dr. Tami Ribas in the morning.  Patient states she understands and is in agreement.  She understands that Dr. Tami Ribas will review the full risks, benefits and alternatives of the procedure in the morning, as we will be the surgeon physically performing the procedure    Patient currently denies acute vision changes (she does wear glasses), nausea, vomiting, acute numbness or tingling to the extremities, chest pain, abdominal pain, back pain or dizziness.     Past Medical History:   Diagnosis Date    ADHD     Malachi Carl infection     MRSA (methicillin resistant staph aureus) culture positive     MS (congenital mitral stenosis)     PCOS (polycystic ovarian syndrome)      No family history on file.  Social History     Tobacco Use    Smoking status: Every Day    Smokeless tobacco: Never   Substance Use Topics    Alcohol use: No      Current Facility-Administered Medications   Medication Dose Route Frequency Provider Last Rate Last Admin    niMODipine (NIMOTOP) capsule 60 mg  60 mg Oral 6 times per day Vella Kohler, MD   60 mg at 11/09/22 2045    acetaminophen (TYLENOL) tablet 1,000 mg  1,000 mg Oral Q8H PRN Ngwafang, Bleck B, MD   1,000 mg at 11/09/22  2050    [START ON 11/10/2022] methocarbamol (ROBAXIN) tablet 750 mg  750 mg Oral 4x Daily Ngwafang, Bleck B, MD        metoclopramide (REGLAN) injection 5 mg  5 mg IntraVENous Q6H PRN Ngwafang, Bleck B, MD        atorvastatin (LIPITOR) tablet 20 mg  20 mg Oral Daily Ngwafang, Bleck B, MD   20 mg at 11/09/22 2128    pantoprazole (PROTONIX) tablet 20 mg  20 mg Oral Daily Ngwafang, Bleck B, MD        sodium chloride flush 0.9 % injection 5-40 mL  5-40 mL IntraVENous 2 times per day Ngwafang, Bleck B, MD        sodium chloride flush 0.9 % injection 5-40 mL  5-40 mL IntraVENous PRN Ngwafang, Bleck B, MD        0.9 % sodium chloride infusion   IntraVENous PRN Ngwafang, Bleck B, MD        potassium chloride (KLOR-CON) extended  release tablet 40 mEq  40 mEq Oral PRN Ngwafang, Bleck B, MD        Or    potassium bicarb-citric acid (EFFER-K) effervescent tablet 40 mEq  40 mEq Oral PRN Ngwafang, Bleck B, MD        Or    potassium chloride 10 mEq/100 mL IVPB (Peripheral Line)  10 mEq IntraVENous PRN Ngwafang, Bleck B, MD        magnesium sulfate 2000 mg in 50 mL IVPB premix  2,000 mg IntraVENous PRN Ngwafang, Bleck B, MD        ondansetron (ZOFRAN-ODT) disintegrating tablet 4 mg  4 mg Oral Q8H PRN Ngwafang, Bleck B, MD        Or    ondansetron (ZOFRAN) injection 4 mg  4 mg IntraVENous Q6H PRN Ngwafang, Bleck B, MD        polyethylene glycol (GLYCOLAX) packet 17 g  17 g Oral Daily PRN Ngwafang, Bleck B, MD        sodium chloride flush 0.9 % injection 5-40 mL  5-40 mL IntraVENous 2 times per day Ngwafang, Bleck B, MD        sodium chloride flush 0.9 % injection 5-40 mL  5-40 mL IntraVENous PRN Ngwafang, Bleck B, MD        0.9 % sodium chloride infusion   IntraVENous PRN Ngwafang, Bleck B, MD         Current Outpatient Medications   Medication Sig Dispense Refill    pantoprazole (PROTONIX) 20 MG tablet Take 1 tablet by mouth daily      acetaminophen (TYLENOL) 325 MG tablet Take 2 tablets by mouth every 6 hours as needed      amphetamine-dextroamphetamine (ADDERALL) 20 MG tablet Take 2 tablets by mouth daily.      atorvastatin (LIPITOR) 20 MG tablet Take 1 tablet by mouth daily      ipratropium-albuterol (DUONEB) 0.5-2.5 (3) MG/3ML SOLN nebulizer solution Inhale 3 mLs into the lungs every 4 hours as needed (Patient not taking: Reported on 03/17/2022)       Allergies   Allergen Reactions    Decadron [Dexamethasone] Swelling and Anxiety    Adhesive Tape Rash    Chlorhexidine Itching    Oxycodone Swelling    Oxycodone-Acetaminophen Itching       Review of Systems:  11pt ROS negative except for above    Objective:     Vitals:    11/09/22 1452   BP: 136/79   Pulse: 73  Resp: 18   Temp:    SpO2: 100%         Physical Exam:  GENERAL: Calm, cooperative,  NAD  CARDIAC: RRR  PULMONARY: No increased work of breathing or shortness of breath noted during exam  SKIN: Warm, dry, color appropriate for ethnicity.     Neurologic Exam:  Mental Status:    Alert and oriented x 4.  Appropriate affect, mood and behavior.       Speech/Language:      Clear, Normal fluency, repetition, comprehension and naming. Follows commands.     Cranial Nerves:     Pupils 3 mm, equal, round and briskly reactive to light.  Visual fields full to confrontation.  Extraocular movements intact.     Facial sensation intact.  Full facial strength, no asymmetry.   Hearing grossly intact bilaterally.  Tongue protrudes to midline, palate elevates symmetrically.   Shoulder shrug 5/5 bilaterally.       Motor:      No pronator drift. 5/5 power in LUE and LLE.  4+ power in the RUE and RLE.     Bulk and tone normal.   No involuntary movements.    Sensation:      Sensation intact to RUE, LUE and LLE. Sensation decreased in the RLE.      Reflexes:      Deferred.    Coordination:   FTN and HTS intact with no ataxia present.    Gait:   Deferred.    Labs:  Recent Results (from the past 12 hour(s))   CBC with Auto Differential    Collection Time: 11/09/22 12:56 PM   Result Value Ref Range    WBC 12.0 (H) 3.6 - 11.0 K/uL    RBC 4.33 3.80 - 5.20 M/uL    Hemoglobin 14.4 11.5 - 16.0 g/dL    Hematocrit 16.1 09.6 - 47.0 %    MCV 94.7 80.0 - 99.0 FL    MCH 33.3 26.0 - 34.0 PG    MCHC 35.1 30.0 - 36.5 g/dL    RDW 04.5 40.9 - 81.1 %    Platelets 368 150 - 400 K/uL    MPV 9.7 8.9 - 12.9 FL    Nucleated RBCs 0.0 0 PER 100 WBC    nRBC 0.00 0.00 - 0.01 K/uL    Neutrophils % 67 32 - 75 %    Lymphocytes % 26 12 - 49 %    Monocytes % 5 5 - 13 %    Eosinophils % 1 0 - 7 %    Basophils % 1 0 - 1 %    Immature Granulocytes % 0 %    Neutrophils Absolute 8.1 (H) 1.8 - 8.0 K/UL    Lymphocytes Absolute 3.1 0.8 - 3.5 K/UL    Monocytes Absolute 0.6 0.0 - 1.0 K/UL    Eosinophils Absolute 0.1 0.0 - 0.4 K/UL    Basophils Absolute 0.1 0.0 - 0.1  K/UL    Immature Granulocytes Absolute 0.0 K/UL    Differential Type MANUAL      RBC Comment NORMOCYTIC, NORMOCHROMIC      WBC Comment REACTIVE LYMPHS     Comprehensive Metabolic Panel    Collection Time: 11/09/22 12:56 PM   Result Value Ref Range    Sodium 138 136 - 145 mmol/L    Potassium 3.8 3.5 - 5.1 mmol/L    Chloride 104 97 - 108 mmol/L    CO2 31 21 - 32 mmol/L    Anion Gap  3 2 - 12 mmol/L    Glucose 92 65 - 100 mg/dL    BUN 9 6 - 20 MG/DL    Creatinine 6.43 3.29 - 1.02 MG/DL    BUN/Creatinine Ratio 11 (L) 12 - 20      Est, Glom Filt Rate >90 >60 ml/min/1.21m2    Calcium 9.2 8.5 - 10.1 MG/DL    Total Bilirubin 0.2 0.2 - 1.0 MG/DL    ALT 28 12 - 78 U/L    AST 21 15 - 37 U/L    Alk Phosphatase 65 45 - 117 U/L    Total Protein 7.3 6.4 - 8.2 g/dL    Albumin 3.8 3.5 - 5.0 g/dL    Globulin 3.5 2.0 - 4.0 g/dL    Albumin/Globulin Ratio 1.1 1.1 - 2.2     Urine Drug Screen    Collection Time: 11/09/22  8:07 PM   Result Value Ref Range    Amphetamine, Urine Negative NEG      Barbiturates, Urine Positive (A) NEG      Benzodiazepines, Urine Negative NEG      Cocaine, Urine Negative NEG      Methadone, Urine Negative NEG      Opiates, Urine Positive (A) NEG      Phencyclidine, Urine Negative NEG      THC, TH-Cannabinol, Urine Positive (A) NEG      Comments: (NOTE)    Urine Culture Hold Sample    Collection Time: 11/09/22  8:07 PM    Specimen: Urine   Result Value Ref Range    Specimen HOld        Urine on hold in Microbiology dept for 2 days.  If unpreserved urine is submitted, it cannot be used for addtional testing after 24 hours, recollection will be required.        Imaging :  CTA HEAD NECK W CONTRAST    Result Date: 11/09/2022  There is no major vessel occlusion. Atypical pattern of postcontrast enhancement is related to numerous small scattered infarcts as demonstrated on brain MRI performed on 05/22/2022. There is no acute intracranial process. There is no aneurysm, dissection or hemodynamically significant stenosis.  Electronically signed by Zenaida Niece HABIB    CT HEAD WO CONTRAST    Result Date: 11/09/2022  1. No new acute intracranial abnormality. 2. Basilar cistern hemorrhage previously described is now difficult to visualize. 3. Persistent but less prominent hypodensities in the frontal lobes bilaterally consistent with suspected embolic ischemia shown on MRI. Electronically signed by Creed Copper     MRI Result (most recent):  MRI BRAIN W WO CONTRAST 11/05/2022    Impression  1. Numerous small scattered acute supratentorial and infratentorial infarcts as  above, largest in the right frontal lobe and right cerebellum, most consistent  with central embolic etiology.  2. Stable small subacute subarachnoid hemorrhage in the right basilar cisterns.      Electronically signed by Salley Scarlet          Assessment/Plan:     1.) Angio-neg SAH, Hunt Hess 2, Fisher Grade 2; Embolic strokes     - s/p cerebral angiogram negative for [facility] source on 11/01/22 by Dr. Eliberto Ivory from previous admission, patient left AMA 11/05/22.   - Day 4/21 of Nimotop (interruption between 10/4 when patient left AMA to 10/8 upon readmission) to prevent delayed cerebral ischemia   - Strict I&O   - ECHO on 11/02/22 revealed 60%-65% EF   - q 4 neuro checks   -  SBP goal <160,  Labetalol PRN   - PT/OT/SLP evals    I have discussed the diagnosis and the intended plan as seen in the above orders with Dr. Tami Ribas, Dr. Darnelle Spangle - Hospitalist, Dr. Alison Murray - Neurology, Patient, Primary nurse in ED.     Thank you for this consult and participating in the care of this patient.    I have spent 60 minutes of time involved in chart review, lab review, imaging review, consultations with specialists and attendings, family discussion/decision making and documentation.     Signed By: Christina Macho, APRN - NP, Neurocritical Care Nurse Practitioner    November 09, 2022         ATTENDING ATTESTATION:  I discussed the findings, assessment and plan with the APP and agree with the  findings and plan as documented.    CTA ok  Would cont nimotop and complete the 21 day course  Asa 81mg   Recommend 30 day event monitor as outpt  Can F/u APP clinic

## 2022-11-09 NOTE — Consults (Signed)
NIS brief note    Readmit of recent angio neg SAH, +embolic strokes in MRI    C/o HA    CTH stable    Rec's:  SBP<160  Asa 81mg   CTA head  Complete her 21 day course of nimotop  Pain control  F/u DrAustin in 4 weeks

## 2022-11-09 NOTE — ED Triage Notes (Signed)
Patient arrives to ED with friend, was at PCP office today following recent discharge.  Patient unable to get into neurology office for follow up.  Patient complaining of headache, fatigue, and "emotional disturbances"    History of [facility] 11/01/22 and discharged AMA on 11/05/22.

## 2022-11-09 NOTE — ED Provider Notes (Signed)
[facility] EMERGENCY DEP  EMERGENCY DEPARTMENT ENCOUNTER      Pt Name: Christina Stevens  MRN: [id number]  Birthdate 04/28/1980  Date of evaluation: 11/09/2022  Provider: Nicholes Calamity, MD    CHIEF COMPLAINT       Chief Complaint   Patient presents with    Headache         HISTORY OF PRESENT ILLNESS   (Location/Symptom, Timing/Onset, Context/Setting, Quality, Duration, Modifying Factors, Severity)  Note limiting factors.   HPI    42 year old female with a past medical history significant for subarachnoid hemorrhage and multiple small infarcts presented to the Emergency Department via self-transport. The history was provided by the patient. She reports a severe headache described as exploding and persistent fatigue. The pain has been unrelieved by her current medications, which include Tylenol, Methocarbamol, and Reglan. She denies any new weakness or numbness since her last hospital discharge. The patient expresses frustration with the healthcare system and previous hospital experiences. She has not had a recent neurological follow-up due to scheduling issues. She walks with cane due to bilateral leg weakness.      MRI on 10/4:  1. Numerous small scattered acute supratentorial and infratentorial infarcts as  above, largest in the right frontal lobe and right cerebellum, most consistent  with central embolic etiology.  2. Stable small subacute subarachnoid hemorrhage in the right basilar cisterns.       Review of External Medical Records:     Nursing Notes were reviewed.    REVIEW OF SYSTEMS    (2-9 systems for level 4, 10 or more for level 5)     Review of Systems    Except as noted above the remainder of the review of systems was reviewed and negative.       PAST MEDICAL HISTORY     Past Medical History:   Diagnosis Date    ADHD     Malachi Carl infection     MRSA (methicillin resistant staph aureus) culture positive     MS (congenital mitral stenosis)     PCOS (polycystic ovarian syndrome)          SURGICAL HISTORY        Past Surgical History:   Procedure Laterality Date    APPENDECTOMY      COLONOSCOPY N/A 03/17/2022    COLONOSCOPY W/ ENDOSCOPIC MUCOSAL RESECTION performed by Nira Retort, MD at [facility] ENDOSCOPY    IR CHOLECYSTOSTOMY PERCUTANEOUS COMPLETE           CURRENT MEDICATIONS       Previous Medications    ACETAMINOPHEN (TYLENOL) 325 MG TABLET    Take 2 tablets by mouth every 6 hours as needed    AMPHETAMINE-DEXTROAMPHETAMINE (ADDERALL) 20 MG TABLET    Take 2 tablets by mouth daily.    ATORVASTATIN (LIPITOR) 20 MG TABLET    Take 1 tablet by mouth daily    IPRATROPIUM-ALBUTEROL (DUONEB) 0.5-2.5 (3) MG/3ML SOLN NEBULIZER SOLUTION    Inhale 3 mLs into the lungs every 4 hours as needed    PANTOPRAZOLE (PROTONIX) 20 MG TABLET    Take 1 tablet by mouth daily       ALLERGIES     Decadron [dexamethasone], Adhesive tape, Chlorhexidine, Oxycodone, and Oxycodone-acetaminophen    FAMILY HISTORY     No family history on file.       SOCIAL HISTORY       Social History     Socioeconomic History    Marital status: Married   Tobacco  Use    Smoking status: Every Day    Smokeless tobacco: Never   Substance and Sexual Activity    Alcohol use: No    Drug use: Yes     Types: Marijuana (Weed)           PHYSICAL EXAM    (up to 7 for level 4, 8 or more for level 5)     ED Triage Vitals [11/09/22 1203]   BP Systolic BP Percentile Diastolic BP Percentile Temp Temp Source Pulse Respirations SpO2   (!) 142/85 -- -- 97.5 F (36.4 C) Oral 88 18 96 %      Height Weight - Scale         1.549 m (5\' 1" ) 70.3 kg (154 lb 15.7 oz)             Body mass index is 29.28 kg/m.    Physical Exam  Vitals and nursing note reviewed.   Constitutional:       Appearance: Normal appearance. She is not ill-appearing.      Comments: Patient upset and angry.  Pt cursing.     HENT:      Head: Normocephalic and atraumatic.      Nose: Nose normal. No congestion or rhinorrhea.      Mouth/Throat:      Mouth: Mucous membranes are moist.   Eyes:      General: No scleral  icterus.        Right eye: No discharge.         Left eye: No discharge.   Cardiovascular:      Rate and Rhythm: Normal rate and regular rhythm.      Pulses: Normal pulses.      Heart sounds: Normal heart sounds. No murmur heard.  Pulmonary:      Effort: Pulmonary effort is normal.      Breath sounds: Normal breath sounds.   Abdominal:      Palpations: Abdomen is soft.      Tenderness: There is no abdominal tenderness.   Musculoskeletal:         General: No swelling, tenderness, deformity or signs of injury. Normal range of motion.      Cervical back: No rigidity or tenderness.   Skin:     General: Skin is warm and dry.   Neurological:      Mental Status: She is alert and oriented to person, place, and time.      Cranial Nerves: No cranial nerve deficit.      Sensory: No sensory deficit.      Motor: No weakness.      Coordination: Coordination normal.      Comments: No arm drift.  Walks with cane.  Symmetric smile.  Extraocular movements intact.  Normal speech.         DIAGNOSTIC RESULTS     EKG: All EKG's are interpreted by the Emergency Department Physician who either signs or Co-signs this chart in the absence of a cardiologist.        RADIOLOGY:   Non-plain film images such as CT, Ultrasound and MRI are read by the radiologist. Plain radiographic images are visualized and preliminarily interpreted by the emergency physician with the below findings:        Interpretation per the Radiologist below, if available at the time of this note:    CTA HEAD NECK W CONTRAST   Final Result   There is no major vessel occlusion.  Atypical pattern of postcontrast enhancement is related to numerous small   scattered infarcts as demonstrated on brain MRI performed on 05/22/2022.   There is no acute intracranial process.   There is no aneurysm, dissection or hemodynamically significant stenosis.            Electronically signed by Zenaida Niece HABIB      CT HEAD WO CONTRAST   Final Result      1. No new acute intracranial  abnormality.   2. Basilar cistern hemorrhage previously described is now difficult to   visualize.   3. Persistent but less prominent hypodensities in the frontal lobes bilaterally   consistent with suspected embolic ischemia shown on MRI.      Electronically signed by Creed Copper           LABS:  Labs Reviewed   CBC WITH AUTO DIFFERENTIAL - Abnormal; Notable for the following components:       Result Value    WBC 12.0 (*)     Neutrophils Absolute 8.1 (*)     All other components within normal limits   COMPREHENSIVE METABOLIC PANEL - Abnormal; Notable for the following components:    BUN/Creatinine Ratio 11 (*)     All other components within normal limits       All other labs were within normal range or not returned as of this dictation.    EMERGENCY DEPARTMENT COURSE and DIFFERENTIAL DIAGNOSIS/MDM:   Vitals:    Vitals:    11/09/22 1203 11/09/22 1452   BP: (!) 142/85 136/79   Pulse: 88 73   Resp: 18 18   Temp: 97.5 F (36.4 C)    TempSrc: Oral    SpO2: 96% 100%   Weight: 70.3 kg (154 lb 15.7 oz)    Height: 1.549 m (5\' 1" )            Medical Decision Making  42 year old female with recent admission for subarachnoid hemorrhage and found to have multiple embolic strokes on MRI.  Patient left AMA and did not complete the workup.  She had difficulty in obtaining outpatient neurology consultation.  Patient with severe headache today.  It has not improved with the Reglan already ordered.  Differential diagnosis includes vasospasm, additional embolic strokes, headaches related to prior strokes, and others.  CT scan shows previously visualized embolic strokes and difficult to visualize the subarachnoid at this point.  No new neurologic symptoms.  Checking labs.  Will consult neurology and neurointerventional surgery.    Amount and/or Complexity of Data Reviewed  Radiology: ordered.    Risk  Prescription drug management.            REASSESSMENT      6:43 PM  D/w Dr. Alison Murray, neurology.  Briefly reviewed the history,  exam and copied the CT read from today.  She states that the patient needs to be admitted for continued stroke workup and that she would see her today or tomorrow.    Pt to go to room 18 per charge nurse shortly after that patient is admitted.      6:43 PM  [facility] texted Dr. Tami Ribas, neurointerventional surgery.  Recommends cta repeat.      6:43 PM  D/w pt. she is agreeable to be admitted.  Will treat headache with 1 dose of Dilaudid for now.  Ordered CTA.  Updated neurologist that patient is willing to be admitted.    1830  CTA without acute findings except for the findings similar from prior strokes.  Will admit to hospitalist    [facility] Consult for Admission  6:43 PM    ED Room Number: ER18/18  Patient Name and age:  ANEEKA BOWDEN 42 y.o.  female  Working Diagnosis:   1. Cerebrovascular accident (CVA), unspecified mechanism (HCC)        COVID-19 Suspicion: No  Sepsis present:  No  Reassessment needed: No  Code Status:  Full Code  Readmission: Yes  Isolation Requirements: no  Recommended Level of Care: telemetry  Department: [facility] Adult ED - [phone]  Consulting Provider: neurology and Neuro IR    Other: Recent admission and left AMA.  Multiple embolic strokes found on MRI.  Patient had a small subarachnoid as well last time.  Patient unable to get appointment as outpatient with neurology.  Patient with severe headache.  CT head and CTA without acute findings except for prior embolic strokes seen.  Neurology recommended continued workup of the strokes and they will consult.  Dr. Tami Ribas Recommends 81mg  ASA per update to him after CTA.  Recommends Nimodipine 60mg  q4 hrs too.      6:43 PM  Updated pt and friend.  Pt is quite pleasant now.  I suspect some of the personality changes are related to the strokes given their location.  I explained this to the patient when she doesn't know why she acted like she did earlier.      CONSULTS:  IP CONSULT TO NEUROLOGY  IP CONSULT TO NEUROINTERVENTIONAL  SURGERY    PROCEDURES:  Unless otherwise noted below, none     Procedures      FINAL IMPRESSION      1. Cerebrovascular accident (CVA), unspecified mechanism (HCC)          DISPOSITION/PLAN   DISPOSITION Decision To Admit 11/09/2022 06:31:21 PM      PATIENT REFERRED TO:  No follow-up provider specified.    DISCHARGE MEDICATIONS:  New Prescriptions    No medications on file         (Please note that portions of this note were completed with a voice recognition program.  Efforts were made to edit the dictations but occasionally words are mis-transcribed.)    Nicholes Calamity, MD (electronically signed)  Emergency Attending Physician / Physician Assistant / Nurse Practitioner             Vella Kohler, MD  11/09/22 1843

## 2022-11-09 NOTE — Consults (Signed)
Spoke with Dr. Scarlett Presto who is covering for General Neurology service.  She understands the patient's current plan with Neurointerventional Surgery group and Dr. Tami Ribas.  Treatment, plan and consult for headache will be deferred to NIS. Please see NIS consult dated for 11/09/22.

## 2022-11-09 NOTE — ED Notes (Signed)
12:01 PM  The patient is a 42 year old female presents emergency department with headache and fatigue.  The patient recently had subarachnoid hemorrhage, she was hospitalized for a week, 9/30-10/4. She was discharged home and now does not have neurologist. She was seen by her PCP today and returned to the hospital due to HA, fatigue.  She had a CVA at the same time.     Also with ADHD, PCOS, gastroparesis            I have evaluated the patient as the Provider in Rapid Medical Evaluation (RME). I have reviewed her vital signs and the triage nurse assessment. I have talked with the patient and any available family and advised that I am the provider in triage and have ordered the appropriate study to initiate their work up based on the clinical presentation during my assessment. I have advised that the patient will be accommodated in the Main ED as soon as possible. I have also requested to contact the triage nurse or myself immediately if the patient experiences any changes in their condition during this brief waiting period.  Sondra Barges, PA-C       Sondra Barges, [place]  11/09/22 [phone]

## 2022-11-09 NOTE — ED Notes (Signed)
2:59 PM  The patient is complaining about pain, HA and nausea, and is still in the waiting room. She is re-evaluated and it is noted that she is taking acetaminophen and methocarbamol ATC for pain management.  She is missing these medications as she did not bring them with her. She was due at 1 pm for the acetaminophen 1000 mg and 1000 mg of methocarbamol is being dosed  TID.      She is ordered these medications in addition to metoclopramide IV as she is vomiting in the ED.     The patient is wanting to leave, and has been advised that as soon as a bed is available she will be taken to the back, in the interim, her tests have been ordered and will be resulting    After receiving the medications, she stated that she is leaving. Her friend is trying to get her to stay.     I have evaluated the patient as the Provider in Rapid Medical Evaluation (RME). I have reviewed her vital signs and the triage nurse assessment. I have talked with the patient and any available family and advised that I am the provider in triage and have ordered the appropriate study to initiate their work up based on the clinical presentation during my assessment. I have advised that the patient will be accommodated in the Main ED as soon as possible. I have also requested to contact the triage nurse or myself immediately if the patient experiences any changes in their condition during this brief waiting period.  Sondra Barges, PA-C       Sondra Barges, [place]  11/09/22 [phone]

## 2022-11-09 NOTE — H&P (Signed)
History & Physical    Primary Care Provider: Ancil Boozer, APRN - NP  Source of Information: Patient and chart review    History of Presenting Illness:   Christina Stevens is a 42 y.o. female with history of congenital mitral stenosis, MRSA, dyslipidemia PCOS, ADHD, recent admission for nontraumatic subarachnoid hemorrhage, embolic CVA who presented to ED with complaints of headaches, nausea, malaise and fatigue.  States she typically takes a regimen of Tylenol methocarbamol and Reglan which relieves her headaches with these have been not effective today; prompting her to seek emergency room care.  Denies any falls or recent head injury.  Of note, she has had no neurology follow-up since discharge from hospital last month.  Reports compliance with her home medications.  She was admitted at [facility] on last admission and transferred to [facility].  She subsequently left AGAINST MEDICAL ADVICE on October 4.  The patient denies any fever, chills, chest or abdominal pain, nausea, vomiting, cough, congestion, recent illness, palpitations, or dysuria.    Remarkable vitals on ER Presentation: vss  Labs Remarkable for: WBC 12  ER Images: CT head, CTA head and neck:   1. No new acute intracranial abnormality.  2. Basilar cistern hemorrhage previously described is now difficult to  visualize.  3. Persistent but less prominent hypodensities in the frontal lobes bilaterally  consistent with suspected embolic ischemia shown on MRI.  Atypical pattern of postcontrast enhancement is related to numerous small  scattered infarcts as demonstrated on brain MRI performed on 05/22/2022.  There is no acute intracranial process.  ER Rx: Tylenol, Dilaudid 0.5 mg, Robaxin, Reglan, amlodipine     Review of Systems:  Pertinent items are noted in the History of Present Illness.     Past Medical History:   Diagnosis Date    ADHD     Malachi Carl infection     MRSA (methicillin resistant staph aureus) culture  positive     MS (congenital mitral stenosis)     PCOS (polycystic ovarian syndrome)       Past Surgical History:   Procedure Laterality Date    APPENDECTOMY      COLONOSCOPY N/A 03/17/2022    COLONOSCOPY W/ ENDOSCOPIC MUCOSAL RESECTION performed by Nira Retort, MD at [facility] ENDOSCOPY    IR CHOLECYSTOSTOMY PERCUTANEOUS COMPLETE       Prior to Admission medications    Medication Sig Start Date End Date Taking? Authorizing Provider   pantoprazole (PROTONIX) 20 MG tablet Take 1 tablet by mouth daily    [provider]   acetaminophen (TYLENOL) 325 MG tablet Take 2 tablets by mouth every 6 hours as needed 11/24/20   Automatic Reconciliation, Ar   amphetamine-dextroamphetamine (ADDERALL) 20 MG tablet Take 2 tablets by mouth daily.    Automatic Reconciliation, Ar   atorvastatin (LIPITOR) 20 MG tablet Take 1 tablet by mouth daily    Automatic Reconciliation, Ar   ipratropium-albuterol (DUONEB) 0.5-2.5 (3) MG/3ML SOLN nebulizer solution Inhale 3 mLs into the lungs every 4 hours as needed  Patient not taking: Reported on 03/17/2022 11/24/20   Automatic Reconciliation, Ar     Allergies   Allergen Reactions    Decadron [Dexamethasone] Swelling and Anxiety    Adhesive Tape Rash    Chlorhexidine Itching    Oxycodone Swelling    Oxycodone-Acetaminophen Itching      No family history on file.     SOCIAL HISTORY:  Patient resides:  Independently x   Assisted Living  SNF    With family care       Smoking history:   None x   Former    Chronic      Alcohol history:   None x   Social    Chronic      Ambulates:   Independently x   w/cane    w/walker    w/wc    CODE STATUS:  DNR    Full x   Other      Objective:     Physical Exam:     BP 136/79   Pulse 73   Temp 97.5 F (36.4 C) (Oral)   Resp 18   Ht 1.549 m (5\' 1" )   Wt 70.3 kg (154 lb 15.7 oz)   SpO2 100%   BMI 29.28 kg/m         General:  Alert, cooperative, no distress, appears stated age.   Head:  Normocephalic, without obvious abnormality, atraumatic.   Eyes:   Conjunctivae/corneas clear. PERRL, EOMs intact.   Nose: Nares normal. Septum midline. Mucosa normal.        Neck: Supple, symmetrical, trachea midline.       Lungs:   Clear to auscultation bilaterally.   Chest wall:  No tenderness or deformity.   Heart:  Regular rate and rhythm, S1, S2 normal   Abdomen:   Soft, non-tender. Bowel sounds normal. No masses,  No organomegaly.   Extremities: Extremities normal, atraumatic, no cyanosis or edema.   Pulses: 2+ and symmetric all extremities.   Skin: Skin color, texture, turgor normal. No rashes or lesions   Neurologic: CNII-XII grossly intact.      Data Review:     Recent Days:  Recent Labs     11/09/22  1256   WBC 12.0*   HGB 14.4   HCT 41.0   PLT 368     Recent Labs     11/09/22  1256   NA 138   K 3.8   CL 104   CO2 31   BUN 9   ALT 28     No results for input(s): "PH", "PCO2", "PO2", "HCO3", "FIO2" in the last 72 hours.    24 Hour Results:  Recent Results (from the past 24 hour(s))   CBC with Auto Differential    Collection Time: 11/09/22 12:56 PM   Result Value Ref Range    WBC 12.0 (H) 3.6 - 11.0 K/uL    RBC 4.33 3.80 - 5.20 M/uL    Hemoglobin 14.4 11.5 - 16.0 g/dL    Hematocrit 14.7 82.9 - 47.0 %    MCV 94.7 80.0 - 99.0 FL    MCH 33.3 26.0 - 34.0 PG    MCHC 35.1 30.0 - 36.5 g/dL    RDW 56.2 13.0 - 86.5 %    Platelets 368 150 - 400 K/uL    MPV 9.7 8.9 - 12.9 FL    Nucleated RBCs 0.0 0 PER 100 WBC    nRBC 0.00 0.00 - 0.01 K/uL    Neutrophils % 67 32 - 75 %    Lymphocytes % 26 12 - 49 %    Monocytes % 5 5 - 13 %    Eosinophils % 1 0 - 7 %    Basophils % 1 0 - 1 %    Immature Granulocytes % 0 %    Neutrophils Absolute 8.1 (H) 1.8 - 8.0 K/UL    Lymphocytes Absolute 3.1 0.8 - 3.5 K/UL  Monocytes Absolute 0.6 0.0 - 1.0 K/UL    Eosinophils Absolute 0.1 0.0 - 0.4 K/UL    Basophils Absolute 0.1 0.0 - 0.1 K/UL    Immature Granulocytes Absolute 0.0 K/UL    Differential Type MANUAL      RBC Comment NORMOCYTIC, NORMOCHROMIC      WBC Comment REACTIVE LYMPHS     Comprehensive  Metabolic Panel    Collection Time: 11/09/22 12:56 PM   Result Value Ref Range    Sodium 138 136 - 145 mmol/L    Potassium 3.8 3.5 - 5.1 mmol/L    Chloride 104 97 - 108 mmol/L    CO2 31 21 - 32 mmol/L    Anion Gap 3 2 - 12 mmol/L    Glucose 92 65 - 100 mg/dL    BUN 9 6 - 20 MG/DL    Creatinine 7.06 2.37 - 1.02 MG/DL    BUN/Creatinine Ratio 11 (L) 12 - 20      Est, Glom Filt Rate >90 >60 ml/min/1.56m2    Calcium 9.2 8.5 - 10.1 MG/DL    Total Bilirubin 0.2 0.2 - 1.0 MG/DL    ALT 28 12 - 78 U/L    AST 21 15 - 37 U/L    Alk Phosphatase 65 45 - 117 U/L    Total Protein 7.3 6.4 - 8.2 g/dL    Albumin 3.8 3.5 - 5.0 g/dL    Globulin 3.5 2.0 - 4.0 g/dL    Albumin/Globulin Ratio 1.1 1.1 - 2.2     Urine Culture Hold Sample    Collection Time: 11/09/22  8:07 PM    Specimen: Urine   Result Value Ref Range    Specimen HOld        Urine on hold in Microbiology dept for 2 days.  If unpreserved urine is submitted, it cannot be used for addtional testing after 24 hours, recollection will be required.         Imaging:     Assessment:     Christina Stevens is a 42 y.o. female with history of congenital mitral stenosis, MRSA, dyslipidemia PCOS, ADHD, recent admission for nontraumatic subarachnoid hemorrhage, embolic CVA who is admitted for subacute CVA.       Plan:       Subacute Embolic CVA  -MRI brain 10/6:  1. Numerous small scattered acute supratentorial and infratentorial infarcts as  above, largest in the right frontal lobe and right cerebellum, most consistent  with central embolic etiology.  2. Stable small subacute subarachnoid hemorrhage in the right basilar cisterns.  - ASA and Plavix held due to recent bleed  - MRI/MRA Head without contrast, MRA neck with contrast   - TTE 10/01: normal  - Avoid hyperthermia - acetaminophen PRN fever   - Permissive HTN   - Observe on telemetry   - PT/OT   - Fall precautions   -Additional workup per neurointerventional surgery was following    Migraine headaches  -Continue PTA cocktail of  Robaxin, Reglan, Imodium as needed Tylenol    Nontraumatic subarachnoid hemorrhage  -resolving on repeat brain imagine  -cont nimodipine per neuro recs    ADHD  -takes Adderall at home  -can have med prn     Hx Hyperlipidemia  -pta lipitor          FEN/GI -  encourage po hydration  Activity - as tolerated  DVT prophylaxis - scds  GI prophylaxis -  none indicated  Disposition - home  CODE STATUS:   full code       Signed By: Ellsworth Lennox, MD     November 09, 2022

## 2022-11-09 NOTE — ED Notes (Signed)
Dr. Naoma Diener assessing patient.    When RN asked patient to rate her headache

## 2022-11-09 NOTE — ED Notes (Addendum)
Patient returns to registration desk, reports "my vision is going in and out in both eyes".  Patient also reports significant increase in headache.  Patient reevaluated in triage by Trudie Buckler, PA and RN.      Visitor reports "she's ready to pull her IV out, but we just want appropriate neuro follow up"    Patient medicated per PA.  After being medicated patient states "I'm going to given you an hour and I'm going to leave and be miserable at home"

## 2022-11-10 ENCOUNTER — Observation Stay: Payer: PRIVATE HEALTH INSURANCE | Primary: Primary Care

## 2022-11-10 ENCOUNTER — Observation Stay: Admit: 2022-11-10 | Discharge: 2022-11-16 | Payer: PRIVATE HEALTH INSURANCE | Primary: Primary Care

## 2022-11-10 LAB — URINE DRUG SCREEN
Amphetamine, Urine: NEGATIVE
Barbiturates, Urine: POSITIVE — AB
Benzodiazepines, Urine: NEGATIVE
Cocaine, Urine: NEGATIVE
Methadone, Urine: NEGATIVE
Opiates, Urine: POSITIVE — AB
Phencyclidine, Urine: NEGATIVE
THC, TH-Cannabinol, Urine: POSITIVE — AB

## 2022-11-10 LAB — CBC
Hematocrit: 37.6 % (ref 35.0–47.0)
Hemoglobin: 12.9 g/dL (ref 11.5–16.0)
MCH: 32.7 pg (ref 26.0–34.0)
MCHC: 34.3 g/dL (ref 30.0–36.5)
MCV: 95.2 FL (ref 80.0–99.0)
MPV: 10.1 FL (ref 8.9–12.9)
Nucleated RBCs: 0 /100{WBCs}
Platelets: 341 10*3/uL (ref 150–400)
RBC: 3.95 M/uL (ref 3.80–5.20)
RDW: 11.9 % (ref 11.5–14.5)
WBC: 13.6 10*3/uL — ABNORMAL HIGH (ref 3.6–11.0)
nRBC: 0 10*3/uL (ref 0.00–0.01)

## 2022-11-10 LAB — BASIC METABOLIC PANEL W/ REFLEX TO MG FOR LOW K
Anion Gap: 7 mmol/L (ref 2–12)
BUN/Creatinine Ratio: 13 (ref 12–20)
BUN: 10 mg/dL (ref 6–20)
CO2: 25 mmol/L (ref 21–32)
Calcium: 8.6 mg/dL (ref 8.5–10.1)
Chloride: 105 mmol/L (ref 97–108)
Creatinine: 0.76 mg/dL (ref 0.55–1.02)
Est, Glom Filt Rate: >90 mL/min/{1.73_m2} (ref >60)
Glucose: 125 mg/dL — ABNORMAL HIGH (ref 65–100)
Potassium: 3 mmol/L — ABNORMAL LOW (ref 3.5–5.1)
Sodium: 137 mmol/L (ref 136–145)

## 2022-11-10 LAB — EXTRA TUBES HOLD

## 2022-11-10 LAB — MAGNESIUM
Magnesium: 2.3 mg/dL (ref 1.6–2.4)
Magnesium: 2.1 mg/dL (ref 1.6–2.4)

## 2022-11-10 LAB — URINE CULTURE HOLD SAMPLE

## 2022-11-10 MED ORDER — DIPHENHYDRAMINE HCL 50 MG/ML IJ SOLN
50 | Freq: Once | INTRAMUSCULAR | Status: AC
Start: 2022-11-10 — End: 2022-11-09
  Administered 2022-11-10: 03:00:00 25 mg via INTRAVENOUS

## 2022-11-10 MED ORDER — SODIUM CHLORIDE 0.9 % IV SOLN
0.9 % | INTRAVENOUS | Status: AC | PRN
Start: 2022-11-10 — End: 2022-11-11

## 2022-11-10 MED ORDER — NORMAL SALINE FLUSH 0.9 % IV SOLN
0.9 % | INTRAVENOUS | Status: AC | PRN
Start: 2022-11-10 — End: 2022-11-11

## 2022-11-10 MED ORDER — ONDANSETRON HCL 4 MG/2ML IJ SOLN
42 MG/2ML | Freq: Four times a day (QID) | INTRAMUSCULAR | Status: AC | PRN
Start: 2022-11-10 — End: 2022-11-11
  Administered 2022-11-10: 03:00:00 4 mg via INTRAVENOUS

## 2022-11-10 MED ORDER — METOCLOPRAMIDE HCL 5 MG/ML IJ SOLN
5 | Freq: Four times a day (QID) | INTRAMUSCULAR | Status: DC | PRN
Start: 2022-11-10 — End: 2022-11-09

## 2022-11-10 MED ORDER — NORMAL SALINE FLUSH 0.9 % IV SOLN
0.9 % | Freq: Two times a day (BID) | INTRAVENOUS | Status: AC
Start: 2022-11-10 — End: 2022-11-11
  Administered 2022-11-11: 01:00:00 10 mL via INTRAVENOUS

## 2022-11-10 MED ORDER — POTASSIUM CHLORIDE ER 10 MEQ PO TBCR
10 | Freq: Once | ORAL | Status: AC
Start: 2022-11-10 — End: 2022-11-10
  Administered 2022-11-10: 20:00:00 40 meq via ORAL

## 2022-11-10 MED ORDER — ATORVASTATIN CALCIUM 10 MG PO TABS
10 MG | Freq: Every day | ORAL | Status: AC
Start: 2022-11-10 — End: 2022-11-10
  Administered 2022-11-10 (×2): 20 mg via ORAL

## 2022-11-10 MED ORDER — ATORVASTATIN CALCIUM 40 MG PO TABS
40 MG | Freq: Every day | ORAL | Status: DC
Start: 2022-11-10 — End: 2022-11-11
  Administered 2022-11-11: 12:00:00 40 mg via ORAL

## 2022-11-10 MED ORDER — POTASSIUM BICARB-CITRIC ACID 20 MEQ PO TBEF
20 MEQ | ORAL | Status: AC | PRN
Start: 2022-11-10 — End: 2022-11-10

## 2022-11-10 MED ORDER — PANTOPRAZOLE SODIUM 20 MG PO TBEC
20 MG | Freq: Every day | ORAL | Status: AC
Start: 2022-11-10 — End: 2022-11-11
  Administered 2022-11-10 – 2022-11-11 (×2): 20 mg via ORAL

## 2022-11-10 MED ORDER — ASPIRIN 81 MG PO CHEW
81 MG | Freq: Every day | ORAL | Status: AC
Start: 2022-11-10 — End: 2022-11-11
  Administered 2022-11-10 – 2022-11-11 (×3): 81 mg via ORAL

## 2022-11-10 MED ORDER — ONDANSETRON 4 MG PO TBDP
4 MG | Freq: Three times a day (TID) | ORAL | Status: AC | PRN
Start: 2022-11-10 — End: 2022-11-11

## 2022-11-10 MED ORDER — MAGNESIUM SULFATE 2000 MG/50 ML IVPB PREMIX
250 GM/50ML | INTRAVENOUS | Status: AC | PRN
Start: 2022-11-10 — End: 2022-11-10

## 2022-11-10 MED ORDER — LABETALOL HCL 5 MG/ML IV SOLN
5 MG/ML | INTRAVENOUS | Status: AC | PRN
Start: 2022-11-10 — End: 2022-11-11

## 2022-11-10 MED ORDER — METOCLOPRAMIDE HCL 5 MG/ML IJ SOLN
5 MG/ML | Freq: Four times a day (QID) | INTRAMUSCULAR | Status: AC | PRN
Start: 2022-11-10 — End: 2022-11-11
  Administered 2022-11-10 – 2022-11-11 (×6): 5 mg via INTRAVENOUS

## 2022-11-10 MED ORDER — BUTALBITAL-APAP-CAFFEINE 50-325-40 MG PO TABS
50-325-40 | ORAL | Status: DC | PRN
Start: 2022-11-10 — End: 2022-11-10
  Administered 2022-11-10 (×2): 1 via ORAL

## 2022-11-10 MED ORDER — POTASSIUM CHLORIDE ER 10 MEQ PO TBCR
10 MEQ | ORAL | Status: AC | PRN
Start: 2022-11-10 — End: 2022-11-10
  Administered 2022-11-10: 08:00:00 40 meq via ORAL

## 2022-11-10 MED ORDER — POTASSIUM CHLORIDE 10 MEQ/100ML IV SOLN
10100 MEQ/0ML | INTRAVENOUS | Status: AC | PRN
Start: 2022-11-10 — End: 2022-11-10

## 2022-11-10 MED ORDER — ACETAMINOPHEN 500 MG PO TABS
500 | Freq: Three times a day (TID) | ORAL | Status: DC | PRN
Start: 2022-11-10 — End: 2022-11-11
  Administered 2022-11-10 – 2022-11-11 (×4): 1000 mg via ORAL

## 2022-11-10 MED ORDER — METHOCARBAMOL 750 MG PO TABS
750 MG | Freq: Four times a day (QID) | ORAL | Status: AC
Start: 2022-11-10 — End: 2022-11-11
  Administered 2022-11-10 – 2022-11-11 (×8): 750 mg via ORAL

## 2022-11-10 MED ORDER — MAGNESIUM SULFATE 2000 MG/50 ML IVPB PREMIX
2 | Freq: Once | INTRAVENOUS | Status: AC
Start: 2022-11-10 — End: 2022-11-10
  Administered 2022-11-10: 04:00:00 2000 mg via INTRAVENOUS

## 2022-11-10 MED ORDER — POLYETHYLENE GLYCOL 3350 17 G PO PACK
17 g | Freq: Every day | ORAL | Status: AC | PRN
Start: 2022-11-10 — End: 2022-11-11

## 2022-11-10 MED FILL — BUTALBITAL-APAP-CAFFEINE 50-325-40 MG PO TABS: 50-325-40 MG | ORAL | Qty: 1

## 2022-11-10 MED FILL — PANTOPRAZOLE SODIUM 20 MG PO TBEC: 20 MG | ORAL | Qty: 1

## 2022-11-10 MED FILL — ONDANSETRON HCL 4 MG/2ML IJ SOLN: 4 MG/2ML | INTRAMUSCULAR | Qty: 2

## 2022-11-10 MED FILL — CHILDRENS ASPIRIN 81 MG PO CHEW: 81 MG | ORAL | Qty: 1

## 2022-11-10 MED FILL — ACETAMINOPHEN EXTRA STRENGTH 500 MG PO TABS: 500 MG | ORAL | Qty: 2

## 2022-11-10 MED FILL — METHOCARBAMOL 750 MG PO TABS: 750 MG | ORAL | Qty: 1

## 2022-11-10 MED FILL — NIMODIPINE 30 MG PO CAPS: 30 MG | ORAL | Qty: 2

## 2022-11-10 MED FILL — MONOJECT FLUSH SYRINGE 0.9 % IV SOLN: 0.9 % | INTRAVENOUS | Qty: 40

## 2022-11-10 MED FILL — METOCLOPRAMIDE HCL 5 MG/ML IJ SOLN: 5 MG/ML | INTRAMUSCULAR | Qty: 2

## 2022-11-10 MED FILL — METHOCARBAMOL 500 MG PO TABS: 500 MG | ORAL | Qty: 2

## 2022-11-10 MED FILL — ATORVASTATIN CALCIUM 10 MG PO TABS: 10 MG | ORAL | Qty: 2

## 2022-11-10 MED FILL — KLOR-CON 10 10 MEQ PO TBCR: 10 MEQ | ORAL | Qty: 4

## 2022-11-10 MED FILL — MAGNESIUM SULFATE 2 GM/50ML IV SOLN: 2 GM/50ML | INTRAVENOUS | Qty: 50

## 2022-11-10 MED FILL — DIPHENHYDRAMINE HCL 50 MG/ML IJ SOLN: 50 MG/ML | INTRAMUSCULAR | Qty: 1

## 2022-11-10 NOTE — Telephone Encounter (Signed)
Records have been requested.

## 2022-11-10 NOTE — ED Notes (Signed)
Handoff report to [employer]

## 2022-11-10 NOTE — Consults (Signed)
NEUROLOGY CONSULT NOTE    Name Christina Stevens Age 42 y.o.   MRN [id number] DOB [birth year]     Consulting Physician: Rebeca Allegra, MD      Chief Complaint:  headache, recent SAH, LLE weakness      Assessment:     Principal Problem:    Acute cerebrovascular accident (CVA) (HCC)  Active Problems:    SAH (subarachnoid hemorrhage) (HCC)    Severe headache  Resolved Problems:    * No resolved hospital problems. *      Patient is a 36 yof with h/o PCOS, 1ppd smoker (quit 1 week ago) and recent admission here for HH2/F2 [facility] 9/30 - 11/05/22 with no vascular source on cerebral angiogram who presented yesterday as recommended by PCP for ongoing work-up and management of SAH (since she left AMA prior to MRI) and symptoms of headache and LLE weakness. CTA head/neck with no significant stenosis or LVO. MRI brain was not done last admission due to her leaving AMA and showed multifocal diffuse anterior and posterior circulation acute infarcts. Most recent TCDS last admission showed no increased vasospasm after angio. Dr. Eliberto Ivory ([facility]) feels that MRI findings less likely related to vasospastic response from angio. She denies IVDA. She has had 1 miscarriage.   A1c (11/02/22) 5.2, LDL 74 (10/3)      Multifocal anterior/posterior acute infarcts  HH2/F2 SAH Day 9  Recommendations:   - Continue aspirin 81 mg daily  - Increase atorvastatin to 40 mg daily, goal <70  - Send hypercoagulable panel   - Obtain TTE with bubble study to eval for PFO and thrombus  - Telemetry monitoring  - Goal normotension  - PT/OT/SLP consults   - Continue nimodipine through day 21        Will follow-up with above studies when available. Please contact with any questions.  History of Present Illness:      This is a 42 y.o. female with history of PCOS, peripheral neuropathy, Autism spectrum disorder, ADHD, history of 1ppd smoker quit 1 week ago, who presented yesterday as recommendation from PCP due to symptoms of headache and LLE weakness and to follow-up  with recommendations following recent hospitalization for [facility] in which she left AMA. She was recently admitted 9/30 - 11/05/22 after having a severe headache with slurred speech and per report passing out. Stat head CT showed acute SAH. CTA head/neck was then obtained that was negative for a vascular source. She has been having pressure type headache behind her eyes since she left AMA and has been "extremely exhausted". MRI brain was not done prior to her leaving AMA. Headaches have been up to 8/10 at home but baseline 3-5/10 and she has been taking Tylenol around the clock. She feels that her speech and walking are slow and she has L foot drop and dragging her LLE. She has been trying to get a Neurology appointment for her symptoms around the area and states she would have to wait at least several months. She saw her PCP on Monday who told her to come to the ED. She denies cocaine, heroin or IVDA. Per documentation, from last admission she smokes marijuana 3 times per day every day. She states she uses THC gummies daily. There have been no known young deaths in her family. She has had 1 miscarriage and 1 ectopic pregnancy. MRI brain showed multifocal diffuse acute infarcts and Neurology is subsequently asked to consult.     Per documentation from [facility], "patient follows with [facility],  was last seen 09/27/22. Per notes, patient has had left sided weakness since 2019,  peripheral neuropathy, foot drop, balance and gait disturbance, and memory issues that started after a prolonged hospitalization for sepsis. She underwent EMG, MRI brain, C-spine, T-spine, lumbar spine as part of the work up. Per Neurology notes, these studies were "suggestive of a functional neurologic disorder,"  and was not felt to have MS."            Allergies   Allergen Reactions    Decadron [Dexamethasone] Swelling and Anxiety    Adhesive Tape Rash    Chlorhexidine Itching    Oxycodone Swelling    Oxycodone-Acetaminophen Itching        Prior  to Admission medications    Medication Sig Start Date End Date Taking? Authorizing Provider   aspirin 81 MG chewable tablet Take 1 tablet by mouth daily   Yes [provider]   Multiple Vitamin (MULTIVITAMIN) TABS tablet Take 1 tablet by mouth daily   Yes [provider]   methocarbamol (ROBAXIN) 500 MG tablet Take 3 tablets by mouth 2 times daily as needed (headache)   Yes [provider]   pantoprazole (PROTONIX) 20 MG tablet Take 1 tablet by mouth daily    [provider]   acetaminophen (TYLENOL) 325 MG tablet Take 2 tablets by mouth every 4 hours as needed for Pain 11/24/20   Automatic Reconciliation, Ar   amphetamine-dextroamphetamine (ADDERALL) 20 MG tablet Take 1 tablet by mouth 2 times daily.    Automatic Reconciliation, Ar   atorvastatin (LIPITOR) 40 MG tablet Take 1 tablet by mouth daily    Automatic Reconciliation, Ar       Past Medical History:   Diagnosis Date    ADHD     Malachi Carl infection     MRSA (methicillin resistant staph aureus) culture positive     MS (congenital mitral stenosis)     PCOS (polycystic ovarian syndrome)         Past Surgical History:   Procedure Laterality Date    APPENDECTOMY      COLONOSCOPY N/A 03/17/2022    COLONOSCOPY W/ ENDOSCOPIC MUCOSAL RESECTION performed by Nira Retort, MD at [facility] ENDOSCOPY    IR CHOLECYSTOSTOMY PERCUTANEOUS COMPLETE          Social History     Tobacco Use    Smoking status: Every Day    Smokeless tobacco: Never   Substance Use Topics    Alcohol use: No        No family history on file.     Patient denies headache, weakness, fatigue, fevers, chills, blurred vision, numbness/tingling, CP or SOB.      Review of Systems:   Comprehensive review of systems performed and negative except for as listed above.     Exam:     BP 122/86   Pulse 61   Temp 98.2 F (36.8 C) (Oral)   Resp 12   Ht 1.549 m (5\' 1" )   Wt 70.3 kg (154 lb 15.7 oz)   SpO2 98%   BMI 29.28 kg/m      General: Well developed, well nourished.  Patient in no apparent distress   Head: Normocephalic, atraumatic, anicteric sclera   Lungs:  No increased WOB   Cardiac: Regular rate rhythm   Abd: Bowel sounds presents x4 quadrants. No tenderness on palpation   Ext: No pedal edema   Skin: No overt signs of rash     Neurological Exam:  Mental  Status: A&Ox3, Follows commands   Speech: Fluent no aphasia or dysarthria.   Cranial Nerves:   VFF.  Facial sensation is normal. Facial movement is symmetric.  Palate is midline.  Normal sternocleidomastoid strength. Tongue is midline. Hearing is intact bilat.   Eyes: PERRL, EOM's full, no nystagmus, no ptosis.   Motor:  RUE/RLE 5/5. LUE 4+/5 with mild drift, LLE 4/5. Normal bulk and tone.   Reflexes:   DTR 2+/4 and symmetrical.  Toes downgoing bilat.    Sensory:   Sensation intact to LT and PP   Gait:  Deferred   Tremor:   No tremor noted.   Cerebellar:  FTN intact     Imaging  I personally reviewed the following images.   CT Results (maximum last 3):  CT Result (most recent):  CTA HEAD NECK W CONTRAST 11/09/2022    Narrative  EXAM: CTA HEAD NECK W CONTRAST    CLINICAL HISTORY: severe HA, h/o SAH.  compare to prior CTA  INDICATION: severe HA, h/o SAH.  compare to prior CTA    COMPARISON: 11/01/2022  CONTRAST: 100 ml [id number]    TECHNIQUE:  Unenhanced scout images were obtained to localize the volume for  acquisition. Multi slice helical axial CT angiography was performed from the  aortic arch to the top of the head during uneventful rapid bolus intravenous  contrast administration.   Coronal and sagittal reformations and 3D post  processing was performed.  CT dose reduction was achieved through use of a  standardized protocol tailored for this examination and automatic exposure  control for dose modulation.  Examination was evaluated utilizing the viz. AI algorithm.    FINDINGS:    CTA NECK  There is conventional three vessel arch anatomy. The bilateral subclavian,  common carotid, and internal carotid arteries are patent  with no flow-limiting  stenosis.  % of right carotid artery stenosis: 0  % of left carotid artery stenosis: 0  Measurements utilizing NASCET criteria.  NASCET method was utilized for calculating stenosis.    The vertebral arteries are codominant and patent. The cervical soft tissues are  unremarkable. There are degenerative changes of the cervical spine.    CTA HEAD  There are A1 segments bilaterally. Azygos A2 segment. Petrous and cavernous  carotid arteries are patent.M1 segments are patent. Symmetric arborization of M2  vessels is demonstrated. The basilar artery is patent. The proximal P1 segments  are patent bilaterally.There is no aneurysm. There are no sizable posterior  communicating arteries.  Scattered foci of cortical/linear/serpiginous hyperenhancement are related to  recent infarcts at the right frontal lobe, left frontal lobe, and right and left  parietal lobes.    Impression  There is no major vessel occlusion.      Atypical pattern of postcontrast enhancement is related to numerous small  scattered infarcts as demonstrated on brain MRI performed on 05/22/2022.  There is no acute intracranial process.  There is no aneurysm, dissection or hemodynamically significant stenosis.        Electronically signed by Zenaida Niece HABIB      MRI Results (maximum last 3):  MRI Result (most recent):  MRI BRAIN W WO CONTRAST 11/05/2022    Narrative  EXAM:  MRI BRAIN W WO CONTRAST    INDICATION:    hypodensity on CT Head (R Frontal Lobe)    COMPARISON: CT head 11/05/2022, MRI brain 04/23/2022.    CONTRAST: 15 ml ProHance.    TECHNIQUE:  Multiplanar multisequence acquisition without and with IV contrast of  the brain.    FINDINGS:  Numerous small scattered acute supratentorial and infratentorial infarcts  throughout the cerebral hemispheres involving all lobes, largest area in the  right frontal lobe, as well as within the bilateral corona radiata, left basal  ganglia and thalamus, and bilateral cerebellum (right larger than  left).    Stable small subacute subarachnoid hemorrhage within the right interpeduncular  fossa and right ambient cistern.    The ventricles are normal in size and position. There is no mass effect. There  is no cerebellar tonsillar herniation. Expected arterial flow-voids are present.  No evidence of abnormal enhancement.    The paranasal sinuses, mastoid air cells, and middle ears are clear. The orbital  contents are within normal limits. No significant osseous or scalp lesions are  identified.    Impression  1. Numerous small scattered acute supratentorial and infratentorial infarcts as  above, largest in the right frontal lobe and right cerebellum, most consistent  with central embolic etiology.  2. Stable small subacute subarachnoid hemorrhage in the right basilar cisterns.      Electronically signed by Salley Scarlet      Lab Review  Lab Results   Component Value Date/Time    WBC 13.6 11/10/2022 01:06 AM    HCT 37.6 11/10/2022 01:06 AM    HGB 12.9 11/10/2022 01:06 AM    PLT 341 11/10/2022 01:06 AM     Lab Results   Component Value Date/Time    NA 137 11/10/2022 01:06 AM    K 3.0 11/10/2022 01:06 AM    CL 105 11/10/2022 01:06 AM    CO2 25 11/10/2022 01:06 AM    BUN 10 11/10/2022 01:06 AM       Signed:  Maudry Diego, ACNP  11/10/2022  10:34 AM

## 2022-11-10 NOTE — Progress Notes (Signed)
PHYSICAL THERAPY EVALUATION/DISCHARGE    Patient: Christina Stevens (42 y.o. female)  Date: 11/10/2022  Primary Diagnosis: Acute cerebrovascular accident (CVA) [facility]) [I63.9]       Precautions: Fall Risk                      ASSESSMENT AND RECOMMENDATIONS:  Based on the objective data below, the patient is at her recent functional baseline s/p admission for HA and fatigue.  CT shows no new acute intracranial abnormality.  Pt s/p recent admission for [facility] and CVA w/ 11/05/2022 MRI showed numerous small scattered acute supratentorial and infratentorial infarcts however pt left AMA.   Pt endorses L foot drop since prior admission, able to independently ambulate with SPC since returning home w/ no falls.  Today pt benefits from supervision ambulating w/ her SPC d/t L foot shuffle/ drop putting her at risk for falls.  Pt is aware of her foot drop, attempts to compensate w/ hip flexion, had no LOB or unsteadiness with this today.  Educated pt in the role of OP PT for her impairments and the benefit of a soft AFO.  Pt plans to pursue both.  Additional acute therapy is not indicated at this time. Will sign off.   Recommend OP PT for LUE weakness & L foot drop.    Recommend with nursing OOB to chair for meals and walking daily with supervision assist and her cane. Thank you for completing as able in order to maintain patient strength, endurance and independence.       Functional Outcome Measure:  The patient scored 21/24 on the AMPAC outcome measure.  Cutoff score <=171,2,3 had higher odds of discharging home with home health or need of SNF/IPR.    Further skilled acute physical therapy is not indicated at this time.       PLAN :  Recommendation for discharge: (in order for the patient to meet his/her long term goals):   Outpatient physical therapy for LUE weakness, L foot drop    Other factors to consider for discharge: no additional factors    IF patient discharges home will need the following DME: patient owns DME required for  discharge, would benefit from AFO. Recommend off the shelf, educated pt where to obtain.        SUBJECTIVE:   Patient stated "Apparently I keep having a bunch of little strokes."    OBJECTIVE DATA SUMMARY:     Past Medical History:   Diagnosis Date    ADHD     Epstein Barr infection     MRSA (methicillin resistant staph aureus) culture positive     MS (congenital mitral stenosis)     PCOS (polycystic ovarian syndrome)      Past Surgical History:   Procedure Laterality Date    APPENDECTOMY      COLONOSCOPY N/A 03/17/2022    COLONOSCOPY W/ ENDOSCOPIC MUCOSAL RESECTION performed by Nira Retort, MD at [facility] ENDOSCOPY    IR CHOLECYSTOSTOMY PERCUTANEOUS COMPLETE         Home Situation and Prior Level of Function:   Social/Functional History  Lives With: Spouse, Daughter, Son (33 and 56 year old children)  Type of Home: House  Home Layout: Two level, Bed/Bath upstairs  Home Access: Stairs to enter with rails  Entrance Stairs - Number of Steps: 4  Entrance Stairs - Rails: Both  Bathroom Shower/Tub: Medical sales representative: Standard  Home Equipment: Gilmer Mor, Crutches  Has the patient had two or  more falls in the past year or any fall with injury in the past year?: Yes (handful in the last 6 months (home and rock climbing))  ADL Assistance: Independent  Homemaking Assistance: Independent  Homemaking Responsibilities: Yes  Ambulation Assistance: Independent  Transfer Assistance: Surveyor, minerals: No  Occupation: Full time employment  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; hybrid  Critical Behavior:  Orientation  Overall Orientation Status: Within Normal Limits  Orientation Level: Oriented X4  Cognition  Overall Cognitive Status: Exceptions  Arousal/Alertness: Appears intact  Following Commands: Appears intact  Attention Span: Appears intact  Memory: Appears intact  Safety Judgement: Decreased awareness of need for safety  Problem Solving: Appears intact  Insights: Appears intact  Initiation: Appears  intact  Sequencing: Appears intact    Hearing:   Hearing  Hearing: Within functional limits    Vision/Perceptual:          Vision  Vision: Impaired  Vision Exceptions: Wears glasses at all times  Tracking: Able to track stimulus in all quads w/o difficulty       Strength:    Strength: Within functional limits (BLE 5/5 though demonstrates L foot drop ambulating)    Tone & Sensation:   Tone: Normal  Sensation: Intact (to light touch)    Coordination:  Coordination: Within functional limits    Range Of Motion:  AROM: Within functional limits (though demonstrates L foot drop ambulating, functional dorsiflexion on MMT)       Functional Mobility:  Bed Mobility:  Overall Level of Assistance: Independent  Rolling: Independent  Supine to Sit: Independent  Sit to Supine: Independent  Scooting: Independent  Transfers:     Art therapist: Yes  Overall Level of Assistance: Supervision;Adaptive equipment (SPC use in R hand)  Interventions: Verbal cues;Safety awareness training  Sit to Stand: Supervision;Adaptive equipment  Stand to Sit: Supervision;Adaptive equipment  Toilet Transfer: Supervision;Adaptive equipment  Tub/Shower Transfer: Supervision (simulated tub transfer with 1 UE support)  Balance:               Balance  Sitting: Intact  Standing: Intact  Ambulation/Gait Training:                       Gait  Gait Training: Yes  Overall Level of Assistance: Supervision  Distance (ft): 125 Feet  Assistive Device: Gait belt;Cane, straight  Interventions: Safety awareness training (w/ L foot drop, soft AFO (over the counter) for the time being)  Swing Pattern: Left asymmetrical  Gait Abnormalities: Steppage gait;Step to gait;Foot drop  [employer] AM-PAC       Basic Mobility Inpatient Short Form (6-Clicks) Version 2  How much HELP from another person do you currently need... (If the patient hasn't done an activity recently, how much help from another person do you think they would need if they tried?) Total A Lot A Little None   1.  Turning from your back to your side while in a flat bed without using bedrails? []   1 []   2 []   3  [x]   4   2.  Moving from lying on your back to sitting on the side of a flat bed without using bedrails? []   1 []   2 []   3  [x]   4   3.  Moving to and from a bed to a chair (including a wheelchair)? []   1 []   2 [x]   3  []   4   4. Standing up from a chair using your arms (e.g. wheelchair or bedside chair)? []   1 []   2 []   3  [x]   4   5.  Walking in hospital room? []   1 []   2 [x]   3  []   4   6.  Climbing 3-5 steps with a railing? []   1 []   2 [x]   3 (inferred)  []   4     Raw Score: 21/24                            Cutoff score <=171,2,3 had higher odds of discharging home with home health or need of SNF/IPR.    1. Emelia Loron, Janeece Riggers, Vinoth Fransico Meadow, Lupe Carney Passek, Thornton Dales. Cassandria Anger.  Validity of the AM-PAC "6-Clicks" Inpatient Daily Activity and Basic Mobility Short Forms. Physical Therapy Mar 2014, [phone]) [phone]; DOI: 10.2522/ptj.20130199  2. Venetia Night. Association of AM-PAC "6-Clicks" Basic Mobility and Daily Activity Scores With Discharge Destination. Phys Ther. 2021 Apr 4;101(4):pzab043. doi: 10.1093/ptj/pzab043. PMID: 40981191.  3. Herbold J, Rajaraman D, Lubertha Basque, Agayby K, [place] S. Activity Measure for Post-Acute Care "6-Clicks" Basic Mobility Scores Predict Discharge Destination After Acute Care Hospitalization in Select Patient Groups: A Retrospective, Observational Study. Arch Rehabil Res Clin Transl. 2022 Jul 16;4(3):100204. doi: 10.1016/j.arrct.4782.956213. PMID: 08657846; PMCID: [id number].  4. Josefina Do, Coster W, Ni P. AM-PAC Short Forms Manual 4.0.  Revised 03/2018.  Pain Rating:  "Slight HA"  Did not quantify    Pain Intervention(s):   nursing notified    Activity Tolerance:   WNL    After treatment:   Patient left in no apparent distress in bed, Call bell within reach, and Side rails x3      COMMUNICATION/EDUCATION:   The patient's plan of care was discussed with: occupational therapist and registered nurse    Patient Education  Education Given To: Patient  Education Provided: Role of Therapy;Plan of Care;Equipment;Fall Prevention Strategies  Education Method: Verbal;Printed Information/Hand-outs  Barriers to Learning: None  Education Outcome: Verbalized understanding    Thank you for this referral.  Arnette Norris, PT  Minutes: 20      Physical Therapy Evaluation Charge Determination   History Examination Presentation Decision-Making   LOW Complexity : Zero comorbidities / personal factors that will impact the outcome / POC LOW Complexity : 1-2 Standardized tests and measures addressing body structure, function, activity limitation and / or participation in recreation  LOW Complexity : Stable, uncomplicated  AM-PAC  LOW      Based on the above components, the patient evaluation is determined to be of the following complexity level: Low

## 2022-11-10 NOTE — ED Notes (Addendum)
Assumed care of pt at this time with Lonzo Cloud. Pt ambulatory to restroom with cane to urinate. Reports headache continues behind eyes. Neurology DNP Christina at bedside to update pt on plan of care, update pt.

## 2022-11-10 NOTE — Progress Notes (Cosign Needed)
Hospitalist Progress Note  Christina Clay, APRN - NP  Answering service: [phone]        Date of Service:  11/10/2022  NAME:  Christina Stevens  DOB:  08-20-1980  MRN:  [id number]      Admission Summary:     [name] is a 42 y.o. female with history of congenital mitral stenosis, MRSA, dyslipidemia, PCOS, ADHD, recent admission for nontraumatic subarachnoid hemorrhage and embolic CVA who presented to ED with complaints of headaches, nausea, malaise and fatigue. Stated she typically takes a regimen of Tylenol methocarbamol and Reglan which relieves her headaches but these were not effective the day of the ED visit; prompting her to seek ED care.  Denies any falls or recent head injury.Of note, she has had no neurology follow-up since discharge from hospital last month. Reports compliance with her home medications. She was admitted at [facility] on last admission and transferred to [facility]. She left AGAINST MEDICAL ADVICE on 10/4  The patient denied any fever, chills, chest or abdominal pain, nausea, vomiting, cough, congestion, recent illness, palpitations, or dysuria.     Interval history / Subjective:     10/9: Patient was seen and examined in the ED, she was lying on the bed no acute distress, cooperative and interactive with assessment.  Is able to freely move about her bed in room if she wants.  Reports that her headache is somewhat better today.  Describes it as a frontal headache to both sides.     Assessment & Plan:     Subacute Embolic CVA  - MRI brain 10/4: Numerous small scattered acute supratentorial and infratentorial infarcts, largest in the R frontal lobe and r cerebellum, most consistent with central embolic etiology. Stable small subacute subarachnoid hemorrhage in the R basilar cisterns.  - CTA head and neck 10/8: No major vessel occlusion.  Atypical pattern of postcontrast enhancement is related to numerous small  scattered infarcts as demonstrated on brain MRI performed on 05/22/2022.  There is no acute intracranial process.  No aneurysm dissection or hemodynamically significant stenosis.  - ASA and Plavix held due to recent bleed  - TTE 10/01: normal  - Avoid hyperthermia - acetaminophen PRN fever   - Permissive HTN   - PT/OT   - Fall precautions   - Additional workup per neurointerventional surgery-today they have recommended a neuro consult for the strokes     Hypokalemia  - K 3.0/Mg 2.1  - replace potassium, recheck in am    Migraine headaches  - Back on Nimotop, Day 5/21     Nontraumatic subarachnoid hemorrhage  - resolving on repeat brain imaging  - continue nimodipine per neuro recs     ADHD  -takes Adderall at home  -can have med prn     Hx Hyperlipidemia  -pta lipitor      Code status: Full  Prophylaxis: SCDs  Care Plan discussed with: patient  Anticipated Disposition: Home  Central Line:   none     Review of Systems:     + Headache, no dizziness  No chest pain or pressure  No shortness of breath or cough  No GI complaints such as nausea, vomiting, diarrhea or constipation  No dysuria  Reports some right sided foot drop with ambulation    Vital Signs:    Last 24hrs VS reviewed since prior progress note. Most recent are:  Vitals:    11/10/22 0757   BP: 122/86   Pulse: 61   Resp:  12   Temp: 98.2 F (36.8 C)   SpO2: 98%       Intake/Output Summary (Last 24 hours) at 11/10/2022 0943  Last data filed at 11/10/2022 0400  Gross per 24 hour   Intake 24.12 ml   Output --   Net 24.12 ml      Physical Examination:     I had a face to face encounter with this patient and independently examined them on 11/10/2022 as outlined below:        Vital signs: BP 122/86 HR 63 (normal sinus rhythm on telemetry) oxygen sats 99% on room air RR 20  General : Young Caucasian female lying on the ED stretcher in no acute distress, cooperative and interactive with assessment  HEENT: EOMI, moist mucus membrane  Neck: Trachea midline  Chest: Clear to  auscultation bilaterally sats 99% on room air RR 20  CVS: RRR.  No murmur noted.  Heart rate 63  Abd: soft/ non tender, non distended, BS active  Ext: Moves all extremities.  No edema.  Neuro/Psych: pleasant mood and affect,  sensory grossly within normal limit, Strength 5/5 in all extremities, alert and oriented to person, place, time and situation  Skin: warm      Data Review:    Review and/or order of clinical lab test  Review and/or order of tests in the radiology section of CPT  Review and/or order of tests in the medicine section of CPT    I have independently reviewed and interpreted patient's lab and all other diagnostic data    Notes reviewed from all clinical/nonclinical/nursing services involved in patient's clinical care. Care coordination discussions were held with appropriate clinical/nonclinical/ nursing providers based on care coordination needs.     Labs:     Recent Labs     11/09/22  1256 11/10/22  0106   WBC 12.0* 13.6*   HGB 14.4 12.9   HCT 41.0 37.6   PLT 368 341     Recent Labs     11/09/22  1256 11/10/22  0106   NA 138 137   K 3.8 3.0*   CL 104 105   CO2 31 25   BUN 9 10   MG 2.3 2.1     Recent Labs     11/09/22  1256   ALT 28   GLOB 3.5     No results for input(s): "INR", "APTT" in the last 72 hours.    Invalid input(s): "PTP"   No results for input(s): "TIBC" in the last 72 hours.    Invalid input(s): "FE", "PSAT", "FERR"   No results found for: "RBCF"   No results for input(s): "PH", "PCO2", "PO2" in the last 72 hours.  No results for input(s): "CPK" in the last 72 hours.    Invalid input(s): "CPKMB", "CKNDX", "TROIQ"  Lab Results   Component Value Date/Time    CHOL 166 11/04/2022 06:59 AM    HDL 56 11/04/2022 06:59 AM    LDL 74.4 11/04/2022 06:59 AM     No results found for: "GLUCPOC"  @LABUA @      Medications Reviewed:     Current Facility-Administered Medications   Medication Dose Route Frequency    butalbital-acetaminophen-caffeine (FIORICET, ESGIC) per tablet 1 tablet  1 tablet Oral  Q4H PRN    niMODipine (NIMOTOP) capsule 60 mg  60 mg Oral 6 times per day    acetaminophen (TYLENOL) tablet 1,000 mg  1,000 mg Oral Q8H PRN    methocarbamol (ROBAXIN) tablet  750 mg  750 mg Oral 4x Daily    atorvastatin (LIPITOR) tablet 20 mg  20 mg Oral Daily    pantoprazole (PROTONIX) tablet 20 mg  20 mg Oral Daily    sodium chloride flush 0.9 % injection 5-40 mL  5-40 mL IntraVENous 2 times per day    sodium chloride flush 0.9 % injection 5-40 mL  5-40 mL IntraVENous PRN    0.9 % sodium chloride infusion   IntraVENous PRN    potassium chloride (KLOR-CON) extended release tablet 40 mEq  40 mEq Oral PRN    Or    potassium bicarb-citric acid (EFFER-K) effervescent tablet 40 mEq  40 mEq Oral PRN    Or    potassium chloride 10 mEq/100 mL IVPB (Peripheral Line)  10 mEq IntraVENous PRN    magnesium sulfate 2000 mg in 50 mL IVPB premix  2,000 mg IntraVENous PRN    ondansetron (ZOFRAN-ODT) disintegrating tablet 4 mg  4 mg Oral Q8H PRN    Or    ondansetron (ZOFRAN) injection 4 mg  4 mg IntraVENous Q6H PRN    polyethylene glycol (GLYCOLAX) packet 17 g  17 g Oral Daily PRN    sodium chloride flush 0.9 % injection 5-40 mL  5-40 mL IntraVENous 2 times per day    sodium chloride flush 0.9 % injection 5-40 mL  5-40 mL IntraVENous PRN    0.9 % sodium chloride infusion   IntraVENous PRN    labetalol (NORMODYNE;TRANDATE) injection 10 mg  10 mg IntraVENous Q4H PRN    aspirin chewable tablet 81 mg  81 mg Oral Daily    metoclopramide (REGLAN) injection 5 mg  5 mg IntraVENous Q6H PRN     Current Outpatient Medications   Medication Sig    pantoprazole (PROTONIX) 20 MG tablet Take 1 tablet by mouth daily    acetaminophen (TYLENOL) 325 MG tablet Take 2 tablets by mouth every 6 hours as needed    amphetamine-dextroamphetamine (ADDERALL) 20 MG tablet Take 2 tablets by mouth daily.    atorvastatin (LIPITOR) 20 MG tablet Take 1 tablet by mouth daily    ipratropium-albuterol (DUONEB) 0.5-2.5 (3) MG/3ML SOLN nebulizer solution Inhale 3 mLs into  the lungs every 4 hours as needed (Patient not taking: Reported on 03/17/2022)   ______________________________________________________________________  EXPECTED LENGTH OF STAY: 2  ACTUAL LENGTH OF STAY:          0               Christina Clay, APRN - NP

## 2022-11-10 NOTE — Progress Notes (Signed)
Pharmacist Admission Medication Reconciliation:    Information obtained from: This medication history was obtained from the patient.  RxQuery data available:  YES    Summary:     Medications added: methocarbamol, aspirin, multivitamin   Medications deleted: ipratropium/albuterol   Dose changes: Adderall 20 mg BID (vs 40 mg daily)     RxQuery pharmacy benefit data reflects medications processed through the patient's insurance, however this data does NOT capture whether the medication is currently being taken by the patient.    Allergies:  Decadron [dexamethasone], Adhesive tape, Chlorhexidine, Oxycodone, and Oxycodone-acetaminophen    Significant PMH/Disease States:   Past Medical History:   Diagnosis Date    ADHD     Malachi Carl infection     MRSA (methicillin resistant staph aureus) culture positive     MS (congenital mitral stenosis)     PCOS (polycystic ovarian syndrome)      Chief Complaint for this Admission:    Chief Complaint   Patient presents with    Headache     Prior to Admission Medications:   Prior to Admission Medications   Prescriptions Last Dose Informant   Multiple Vitamin (MULTIVITAMIN) TABS tablet     Sig: Take 1 tablet by mouth daily   acetaminophen (TYLENOL) 325 MG tablet     Sig: Take 2 tablets by mouth every 4 hours as needed for Pain   amphetamine-dextroamphetamine (ADDERALL) 20 MG tablet     Sig: Take 1 tablet by mouth 2 times daily.   aspirin 81 MG chewable tablet     Sig: Take 1 tablet by mouth daily   atorvastatin (LIPITOR) 40 MG tablet     Sig: Take 1 tablet by mouth daily   methocarbamol (ROBAXIN) 500 MG tablet     Sig: Take 3 tablets by mouth 2 times daily as needed (headache)   pantoprazole (PROTONIX) 20 MG tablet     Sig: Take 1 tablet by mouth daily      Facility-Administered Medications: None     Thank you for allowing me to participate in this patient's care.  Please call [phone] or [phone] with any questions.    Everette Rank, Pharm.D., BCPS, BCPPS

## 2022-11-10 NOTE — Plan of Care (Signed)
Problem: Safety - Adult  Goal: Free from fall injury  Outcome: Progressing     Problem: Chronic Conditions and Co-morbidities  Goal: Patient's chronic conditions and co-morbidity symptoms are monitored and maintained or improved  Outcome: Progressing     Problem: Discharge Planning  Goal: Discharge to home or other facility with appropriate resources  Outcome: Progressing     Problem: Pain  Goal: Verbalizes/displays adequate comfort level or baseline comfort level  Outcome: Progressing  Flowsheets  Taken 11/10/2022 0633 by Sabino Niemann, RN  Verbalizes/displays adequate comfort level or baseline comfort level: Assess pain using appropriate pain scale  Taken 11/10/2022 0400 by Sabino Niemann, RN  Verbalizes/displays adequate comfort level or baseline comfort level: Encourage patient to monitor pain and request assistance

## 2022-11-10 NOTE — Progress Notes (Signed)
OCCUPATIONAL THERAPY EVALUATION/DISCHARGE  Patient: Christina Stevens (42 y.o. female)  Date: 11/10/2022  Primary Diagnosis: Acute cerebrovascular accident (CVA) [facility]) [I63.9]         Precautions:                    ASSESSMENT :  Based on the objective data below, the patient presents near her Mod I baseline s/p admission for HA and visual changes, CT- for acute neuro process however recent [facility] & CVA noted on imaging (recently admitted, left AMA prior completion of workup). She is Mod I with SPC at baseline, lives with her wife and children, reports chronic LUE weakness and L foot drop. She is now mobilizing and completing BADLs with IND-SPV with SPC use, intermittent L foot drop noted however no difficulty compensating with simulated tub transfer and dressing tasks this session. Reviewed home safety and recommendations for OP f/u to address deficits above, patient acknowledging understanding.      Functional Outcome Measure:  The patient scored 24/24 on the AM-PAC outcome measure which is indicative of lower odds of rehab needs upon d/c.      Further skilled acute occupational therapy is not indicated at this time.     PLAN :  Recommend with staff: Recommend with nursing, ADLs with supervision/setup, OOB to chair 3x/day via straight cane and toileting via functional mobility to and from bathroom with SPV and cane. Thank you for completing as able in order to maintain patient strength, endurance and independence.       Recommendation for discharge: (in order for the patient to meet his/her long term goals):   Outpatient occupational therapy for LUE and ADL/IADL performance    Other factors to consider for discharge: no additional factors    IF patient discharges home will need the following DME: none     SUBJECTIVE:   Patient stated, "Well they say I just keep having strokes."    OBJECTIVE DATA SUMMARY:     Past Medical History:   Diagnosis Date    ADHD     Epstein Barr infection     MRSA (methicillin resistant staph  aureus) culture positive     MS (congenital mitral stenosis)     PCOS (polycystic ovarian syndrome)      Past Surgical History:   Procedure Laterality Date    APPENDECTOMY      COLONOSCOPY N/A 03/17/2022    COLONOSCOPY W/ ENDOSCOPIC MUCOSAL RESECTION performed by Nira Retort, MD at [facility] ENDOSCOPY    IR CHOLECYSTOSTOMY PERCUTANEOUS COMPLETE         Prior Level of Function/Environment/Context:   , ADL Assistance: Independent,  ,  ,  ,  ,  , Homemaking Assistance: Independent, Ambulation Assistance: Independent, Transfer Assistance: Independent, Active Driver: No     Expanded or extensive additional review of patient history:   Social/Functional History  Lives With: Spouse, Daughter, Son (1 and 74 year old children)  Type of Home: House  Home Layout: Two level, Bed/Bath upstairs  Home Access: Stairs to enter with rails  Entrance Stairs - Number of Steps: 4  Entrance Stairs - Rails: Both  Bathroom Shower/Tub: Medical sales representative: Standard  Home Equipment: Medical laboratory scientific officer, Crutches  Has the patient had two or more falls in the past year or any fall with injury in the past year?: Yes (handful in the last 6 months (home and rock climbing))  ADL Assistance: Independent  Homemaking Assistance: Independent  Homemaking Responsibilities: Yes  Ambulation Assistance: Independent  Transfer Assistance: Independent  Active Driver: No  Occupation: Full time employment  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; hybrid    Higher education careers adviser Dominance: right     EXAMINATION OF PERFORMANCE DEFICITS:    Cognitive/Behavioral Status:    Orientation  Overall Orientation Status: Within Normal Limits  Orientation Level: Oriented X4  Cognition  Overall Cognitive Status: Exceptions  Arousal/Alertness: Appears intact  Following Commands: Appears intact  Attention Span: Appears intact  Memory: Appears intact  Safety Judgement: Decreased awareness of need for safety  Problem Solving: Appears intact  Insights: Appears intact  Initiation: Appears  intact  Sequencing: Appears intact    Skin: Appears intact    Edema: None    Hearing:   Hearing  Hearing: Within functional limits    Vision/Perceptual:          Vision  Vision: Impaired  Vision Exceptions: Wears glasses at all times  Tracking: Able to track stimulus in all quads w/o difficulty       Range of Motion:   AROM: Generally decreased, functional (intermittent L foot drop, inconsistent demo of function)  PROM: Within functional limits      Strength:  Strength: Generally decreased, functional (LUE decreased (proximal>distal), inconsistent L foot drop)      Coordination:  Coordination: Generally decreased, functional (L foot)            Tone & Sensation:   Tone:  (reports baseline L foot drop)  Sensation: Impaired (reports diminished sensation in L knee from prior sx)      Functional Mobility and Transfers for ADLs:  Bed Mobility:     Bed Mobility Training  Bed Mobility Training: Yes  Overall Level of Assistance: Independent  Rolling: Independent  Supine to Sit: Independent  Sit to Supine: Independent  Scooting: Independent    Transfers:     Art therapist: Yes  Overall Level of Assistance: Supervision;Adaptive equipment (SPC use in R hand)  Interventions: Verbal cues;Safety awareness training  Sit to Stand: Supervision;Adaptive equipment  Stand to Sit: Supervision;Adaptive equipment  Toilet Transfer: Supervision;Adaptive equipment  Tub/Shower Transfer: Supervision (simulated tub transfer with 1 UE support)                                   Balance:      Balance  Sitting: Intact  Standing: Intact      ADL Assessment:          Feeding: Independent       Grooming: Supervision;Based on clinical judgement       UE Bathing: Supervision;Based on clinical judgement       Skin Care: N/A    LE Bathing: Supervision;Based on clinical judgement       UE Dressing: Supervision;Based on clinical judgement       LE Dressing: Supervision  LE Dressing Skilled Clinical Factors: donning shoes with setup  EOB, able to stand and complete dynamic balance tasks with SPV using SPC    Toileting: Supervision  Toileting Skilled Clinical Factors: simulated in bathroom           Functional Mobility: Supervision;Adaptive equipment  Functional Mobility Skilled Clinical Factors: hallway ambulation with [facility]     Skin Care: N/A    ADL Intervention and task modifications:      [employer] AM-PACTM "6 Clicks"  Daily Activity Inpatient Short Form  How much help from another person does the patient currently need... Total; A Lot A Little None   1.  Putting on and taking off regular lower body clothing? []   1 []   2 []   3 [x]   4   2.  Bathing (including washing, rinsing, drying)? []   1 []   2 []   3 [x]   4   3.  Toileting, which includes using toilet, bedpan or urinal? []  1 []   2 []   3 [x]   4   4.  Putting on and taking off regular upper body clothing? []   1 []   2 []   3 [x]   4   5.  Taking care of personal grooming such as brushing teeth? []   1 []   2 []   3 [x]   4   6.  Eating meals? []   1 []   2 []   3 [x]   4    2007, Trustees of [address], under license to [employer], [place]. All rights reserved     Score: 24/24     Interpretation of Tool:  Represents clinically-significant functional categories (i.e. Activities of daily living).    Cutoff score 39.4 (19) correlates to a good likelihood of discharging home versus a facility  Diane U. Frederick Peers, Janeece Riggers, Leslie Andrea, Lupe Carney. Passek, Thornton Dales. Cassandria Anger, AM-PAC "6-Clicks" Functional Assessment Scores Predict Acute Care Hospital Discharge Destination, Physical Therapy, Volume 94, Issue 9, 02 October 2012, Pages [phone], [email or web address]    Pain Rating:  Slight HA reported, not rated/10   Pain Intervention(s):   nursing notified    Activity Tolerance:   WNL    After treatment:   Patient left in no apparent distress in bed and Call bell within reach    COMMUNICATION/EDUCATION:   The  patient's plan of care was discussed with: physical therapist and registered nurse    Patient Education  Education Given To: Patient  Education Provided: Role of Therapy;Home Exercise Program;Precautions;ADL Adaptive Strategies;Transfer Training;Energy Conservation;IADL Safety;Fall Prevention Strategies  Education Method: Demonstration;Verbal;Teach Back  Barriers to Learning: None  Education Outcome: Verbalized understanding;Demonstrated understanding    Thank you for this referral.  Daine Floras, OTD, OTR/L  Minutes: 23    Occupational Therapy Evaluation Charge Determination   History Examination Decision-Making   LOW Complexity : Brief history review  LOW Complexity: 1-3 Performance deficits relating to physical, cognitive, or psychosocial skills that result in activity limitations and/or participation restrictions LOW Complexity: No comorbidities that affect functional and  no verbal  or physical assist needed to complete eval tasks   Based on the above components, the patient evaluation is determined to be of the following complexity level: Low

## 2022-11-10 NOTE — Progress Notes (Signed)
Neurointerventional Surgery Progress Note  Neurocritical Care NP    Admit Date: 11/09/2022   LOS: 0 days      Daily Progress Note: 11/10/2022    Assessment:     - Acute non-traumatic SAH, DSA 11/01/22 negative for vascular source. 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 Tami Ribas.     HPI: Christina Stevens is a 42 y.o.  White (non-Hispanic)  female with a pmh of PCOS, peripheral neuropathy, and ADHD who presented to [facility] ED 11/01/22 reporting a severe headache x6 days with slurred speech and stating she passed out. A Code Stroke was called and a stat CT head was obtained which showed acute SAH. CTA head/neck was then obtained which was negative for a vascular source. BP noted to be 179/102 on arrival to ED. NSGY was consulted and recommended NIS consult. Pt was then transferred to [facility] ICU for higher level of care. Pt wife at bedside reports pt takes adderall for ADHD. She reports the pt has a prescription for medical marijuana and smokes marijuana 3 times per day every single day. She denies pt using any street drugs and she states the pt does not drink any alcohol. Pt follows with [facility], was last seen 09/27/22. Per notes, pt has had left sided weakness since 2019 peripheral neuropathy, foot drop, balance and gait disturbance, and memory issues that started  after a prolonged hospitalization for sepsis. She underwent EMG, MRI brain, C-spine, T-spine, lumbar spine as part of the work up. 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    She left AMA 11/05/22 after she underwent MRI. MRI showed numerous scattered CVAs, she left AMA prior to Neurology eval. She returned to the hospital due to continued severe headaches.    Subjective:     No neuro events overnight. Headaches better controlled this am.      Current Facility-Administered Medications   Medication Dose Route Frequency Provider Last Rate Last Admin    butalbital-acetaminophen-caffeine (FIORICET, ESGIC) per tablet 1 tablet  1 tablet Oral Q4H PRN Ngwafang, Bleck B, MD   1 tablet at 11/10/22 0410    niMODipine (NIMOTOP) capsule 60 mg  60 mg Oral 6 times per day Vella Kohler, MD   60 mg at 11/10/22 0316    acetaminophen (  TYLENOL) tablet 1,000 mg  1,000 mg Oral Q8H PRN Ngwafang, Bleck B, MD   1,000 mg at 11/09/22 2050    methocarbamol (ROBAXIN) tablet 750 mg  750 mg Oral 4x Daily Ngwafang, Bleck B, MD   750 mg at 11/10/22 0017    atorvastatin (LIPITOR) tablet 20 mg  20 mg Oral Daily Ngwafang, Bleck B, MD   20 mg at 11/09/22 2128    pantoprazole (PROTONIX) tablet 20 mg  20 mg Oral Daily Ngwafang, Bleck B, MD        sodium chloride flush 0.9 % injection 5-40 mL  5-40 mL IntraVENous 2 times per day Ngwafang, Bleck B, MD        sodium chloride flush 0.9 % injection 5-40 mL  5-40 mL IntraVENous PRN Ngwafang, Bleck B, MD        0.9 % sodium chloride infusion   IntraVENous PRN Ngwafang, Bleck B, MD        potassium chloride (KLOR-CON) extended release tablet 40 mEq  40 mEq Oral PRN Ngwafang, Bleck B, MD   40 mEq at 11/10/22 0410    Or    potassium bicarb-citric acid (EFFER-K) effervescent tablet 40 mEq  40 mEq Oral PRN Ngwafang, Bleck B, MD        Or    potassium chloride 10 mEq/100 mL IVPB (Peripheral Line)  10 mEq IntraVENous PRN Ngwafang, Bleck  B, MD        magnesium sulfate 2000 mg in 50 mL IVPB premix  2,000 mg IntraVENous PRN Ngwafang, Bleck B, MD        ondansetron (ZOFRAN-ODT) disintegrating tablet 4 mg  4 mg Oral Q8H PRN Ngwafang, Bleck B, MD        Or    ondansetron (ZOFRAN) injection 4 mg  4 mg IntraVENous Q6H PRN Ngwafang, Bleck B, MD   4 mg at 11/09/22 2321    polyethylene glycol (GLYCOLAX) packet 17 g  17 g Oral Daily PRN Ngwafang, Bleck B, MD        sodium chloride flush 0.9 % injection 5-40 mL  5-40 mL IntraVENous 2 times per day Ngwafang, Bleck B, MD        sodium chloride flush 0.9 % injection 5-40 mL  5-40 mL IntraVENous PRN Ngwafang, Bleck B, MD        0.9 % sodium chloride infusion   IntraVENous PRN Ngwafang, Bleck B, MD        labetalol (NORMODYNE;TRANDATE) injection 10 mg  10 mg IntraVENous Q4H PRN [name], APRN - NP        aspirin chewable tablet 81 mg  81 mg Oral Daily [name], APRN - NP   81 mg at 11/09/22 2321    metoclopramide (REGLAN) injection 5 mg  5 mg IntraVENous Q6H PRN [name], APRN - NP   5 mg at 11/10/22 0410     Current Outpatient Medications   Medication Sig Dispense Refill    pantoprazole (PROTONIX) 20 MG tablet Take 1 tablet by mouth daily      acetaminophen (TYLENOL) 325 MG tablet Take 2 tablets by mouth every 6 hours as needed      amphetamine-dextroamphetamine (ADDERALL) 20 MG tablet Take 2 tablets by mouth daily.      atorvastatin (LIPITOR) 20 MG tablet Take 1 tablet by mouth daily      ipratropium-albuterol (DUONEB) 0.5-2.5 (3) MG/3ML SOLN nebulizer solution Inhale 3 mLs into the lungs every 4 hours as needed (Patient not taking:  Reported on 03/17/2022)       Allergies   Allergen Reactions    Decadron [Dexamethasone] Swelling and Anxiety    Adhesive Tape Rash    Chlorhexidine Itching    Oxycodone Swelling    Oxycodone-Acetaminophen Itching     Review of Systems:  Pertinent items are noted in the History of Present Illness.    Objective:     Vital signs  Temp (24hrs), Avg:98.2 F  (36.8 C), Min:97.5 F (36.4 C), Max:98.7 F (37.1 C)   No intake/output data recorded.  10/07 1901 - 10/09 0700  In: 24.1   Out: -     BP 109/78   Pulse 72   Temp 98.2 F (36.8 C) (Oral)   Resp 16   Ht 1.549 m (5\' 1" )   Wt 70.3 kg (154 lb 15.7 oz)   SpO2 97%   BMI 29.28 kg/m            Intake/Output Summary (Last 24 hours) at 11/10/2022 0742  Last data filed at 11/10/2022 0400  Gross per 24 hour   Intake 24.12 ml   Output --   Net 24.12 ml      Physical Exam:  Gen: NAD, calm, cooperative  Skin: Warm, dry, color appropriate for ethnicity.      Neurologic Exam:  Mental Status:               Alert and oriented x 4.  Appropriate affect, mood and behavior.        Speech and Language:                        Clear, Normal fluency, repetition, comprehension and naming. Follows commands.      Cranial Nerves:             Pupils 3 mm, equal, round and briskly reactive to light.  Visual fields full to confrontation.  Extraocular movements intact.    Facial sensation intact.  Full facial strength, no asymmetry.   Hearing intact bilaterally.  No dysarthria. Tongue protrudes to midline, palate elevates symmetrically.              Shoulder shrug 5/5 bilaterally.     Motor:                            No pronator drift. 5/5 power in right extremities. Left leg 4/5 strength (baseline per pt from knee surgery)  Bulk and tone normal.   No involuntary movements.     Sensation:                     Sensation mildly diminished in left lower leg (baseline per pt from prior surgery)  Parietal function intact.     Coordination:  FTN, HTS intact.     Gait:       Deferred.      24 hour results:  Recent Results (from the past 24 hour(s))   CBC with Auto Differential    Collection Time: 11/09/22 12:56 PM   Result Value Ref Range    WBC 12.0 (H) 3.6 - 11.0 K/uL    RBC 4.33 3.80 - 5.20 M/uL    Hemoglobin 14.4 11.5 - 16.0 g/dL    Hematocrit 30.8 65.7 - 47.0 %    MCV 94.7 80.0 - 99.0 FL    MCH 33.3 26.0 - 34.0 PG    MCHC 35.1 30.0 - 36.5  g/dL    RDW 13.2 44.0 - 10.2 %    Platelets 368 150 - 400 K/uL    MPV 9.7 8.9 - 12.9 FL    Nucleated RBCs 0.0 0 PER 100 WBC    nRBC 0.00 0.00 - 0.01 K/uL    Neutrophils % 67 32 - 75 %    Lymphocytes % 26 12 - 49 %    Monocytes % 5 5 - 13 %    Eosinophils % 1 0 - 7 %    Basophils % 1 0 - 1 %    Immature Granulocytes % 0 %    Neutrophils Absolute 8.1 (H) 1.8 - 8.0 K/UL    Lymphocytes Absolute 3.1 0.8 - 3.5 K/UL    Monocytes Absolute 0.6 0.0 - 1.0 K/UL    Eosinophils Absolute 0.1 0.0 - 0.4 K/UL    Basophils Absolute 0.1 0.0 - 0.1 K/UL    Immature Granulocytes Absolute 0.0 K/UL    Differential Type MANUAL      RBC Comment NORMOCYTIC, NORMOCHROMIC      WBC Comment REACTIVE LYMPHS     Comprehensive Metabolic Panel    Collection Time: 11/09/22 12:56 PM   Result Value Ref Range    Sodium 138 136 - 145 mmol/L    Potassium 3.8 3.5 - 5.1 mmol/L    Chloride 104 97 - 108 mmol/L    CO2 31 21 - 32 mmol/L    Anion Gap 3 2 - 12 mmol/L    Glucose 92 65 - 100 mg/dL    BUN 9 6 - 20 MG/DL    Creatinine 7.25 3.66 - 1.02 MG/DL    BUN/Creatinine Ratio 11 (L) 12 - 20      Est, Glom Filt Rate >90 >60 ml/min/1.41m2    Calcium 9.2 8.5 - 10.1 MG/DL    Total Bilirubin 0.2 0.2 - 1.0 MG/DL    ALT 28 12 - 78 U/L    AST 21 15 - 37 U/L    Alk Phosphatase 65 45 - 117 U/L    Total Protein 7.3 6.4 - 8.2 g/dL    Albumin 3.8 3.5 - 5.0 g/dL    Globulin 3.5 2.0 - 4.0 g/dL    Albumin/Globulin Ratio 1.1 1.1 - 2.2     Magnesium    Collection Time: 11/09/22 12:56 PM   Result Value Ref Range    Magnesium 2.3 1.6 - 2.4 mg/dL   Urine Drug Screen    Collection Time: 11/09/22  8:07 PM   Result Value Ref Range    Amphetamine, Urine Negative NEG      Barbiturates, Urine Positive (A) NEG      Benzodiazepines, Urine Negative NEG      Cocaine, Urine Negative NEG      Methadone, Urine Negative NEG      Opiates, Urine Positive (A) NEG      Phencyclidine, Urine Negative NEG      THC, TH-Cannabinol, Urine Positive (A) NEG      Comments: (NOTE)    Urine Culture Hold Sample     Collection Time: 11/09/22  8:07 PM    Specimen: Urine   Result Value Ref Range    Specimen HOld        Urine on hold in Microbiology dept for 2 days.  If unpreserved urine is submitted, it cannot be used for addtional testing after 24 hours, recollection will be required.   Extra Tubes Hold    Collection Time: 11/10/22  1:06 AM  Result Value Ref Range    Specimen HOld 1BLUE 1RED 1PST 1LAV     Comment:        Add-on orders for these samples will be processed based on acceptable specimen integrity and analyte stability, which may vary by analyte.   Basic Metabolic Panel w/ Reflex to MG    Collection Time: 11/10/22  1:06 AM   Result Value Ref Range    Sodium 137 136 - 145 mmol/L    Potassium 3.0 (L) 3.5 - 5.1 mmol/L    Chloride 105 97 - 108 mmol/L    CO2 25 21 - 32 mmol/L    Anion Gap 7 2 - 12 mmol/L    Glucose 125 (H) 65 - 100 mg/dL    BUN 10 6 - 20 MG/DL    Creatinine 1.61 0.96 - 1.02 MG/DL    BUN/Creatinine Ratio 13 12 - 20      Est, Glom Filt Rate >90 >60 ml/min/1.1m2    Calcium 8.6 8.5 - 10.1 MG/DL   CBC    Collection Time: 11/10/22  1:06 AM   Result Value Ref Range    WBC 13.6 (H) 3.6 - 11.0 K/uL    RBC 3.95 3.80 - 5.20 M/uL    Hemoglobin 12.9 11.5 - 16.0 g/dL    Hematocrit 04.5 40.9 - 47.0 %    MCV 95.2 80.0 - 99.0 FL    MCH 32.7 26.0 - 34.0 PG    MCHC 34.3 30.0 - 36.5 g/dL    RDW 81.1 91.4 - 78.2 %    Platelets 341 150 - 400 K/uL    MPV 10.1 8.9 - 12.9 FL    Nucleated RBCs 0.0 0 PER 100 WBC    nRBC 0.00 0.00 - 0.01 K/uL   Magnesium    Collection Time: 11/10/22  1:06 AM   Result Value Ref Range    Magnesium 2.1 1.6 - 2.4 mg/dL     Imaging (personally reviewed by myself):  TCD 11/02/22: No evidence of intracranial arterial vasospasm.   TCD 11/03/22: Possible right PCA vasospasm.   TCD 11/04/22: Right PCA vasospasm and possible left PCA vasospasm   TCD 11/05/22: Bilateral PCA vasospasm, right greater than left, increased in comparison to yesterday's exam.     MRI brain 11/05/22: Numerous small scattered acute  supratentorial and infratentorial infarcts largest in the right frontal lobe and right cerebellum, most consistent  with central embolic etiology.    CTA 11/09/22 negative for vasospasm.      I have spent 35 minutes of time involved in chart review, lab review, imaging review, consultations with specialists and attendings, family discussion/decision making and documentation.     Signed By: Saddie Benders, APRN - NP, Neurocritical Care Nurse Practitioner    November 10, 2022

## 2022-11-11 LAB — CBC
Hematocrit: 39.9 % (ref 35.0–47.0)
Hemoglobin: 13.4 g/dL (ref 11.5–16.0)
MCH: 32.3 pg (ref 26.0–34.0)
MCHC: 33.6 g/dL (ref 30.0–36.5)
MCV: 96.1 FL (ref 80.0–99.0)
MPV: 9.9 FL (ref 8.9–12.9)
Nucleated RBCs: 0 /100{WBCs}
Platelets: 355 10*3/uL (ref 150–400)
RBC: 4.15 M/uL (ref 3.80–5.20)
RDW: 12.1 % (ref 11.5–14.5)
WBC: 12.8 10*3/uL — ABNORMAL HIGH (ref 3.6–11.0)
nRBC: 0 10*3/uL (ref 0.00–0.01)

## 2022-11-11 LAB — PROTEIN S PROFILE
Protein S Ag, Free: 106 % (ref 61–136)
Protein S Ag, Total: 80 % (ref 60–150)
Protein S, Functional: 104 % (ref 63–140)

## 2022-11-11 LAB — ECHO (TTE) LIMITED (PRN CONTRAST/BUBBLE/STRAIN/3D)
Body Surface Area: 1.74 m2
EF Physician: 60 %

## 2022-11-11 LAB — BASIC METABOLIC PANEL
Anion Gap: 3 mmol/L (ref 2–12)
BUN/Creatinine Ratio: 15 (ref 12–20)
BUN: 12 mg/dL (ref 6–20)
CO2: 29 mmol/L (ref 21–32)
Calcium: 9 mg/dL (ref 8.5–10.1)
Chloride: 106 mmol/L (ref 97–108)
Creatinine: 0.81 mg/dL (ref 0.55–1.02)
Est, Glom Filt Rate: >90 mL/min/{1.73_m2} (ref >60)
Glucose: 110 mg/dL — ABNORMAL HIGH (ref 65–100)
Potassium: 4 mmol/L (ref 3.5–5.1)
Sodium: 138 mmol/L (ref 136–145)

## 2022-11-11 LAB — EXTRA TUBES HOLD

## 2022-11-11 LAB — ANTITHROMBIN III: Antithrombin Activity: 132 % (ref 75–135)

## 2022-11-11 LAB — PROTEIN C ACTIVITY: Protein C-Functional: 162 % (ref 73–180)

## 2022-11-11 MED ORDER — NIMODIPINE 30 MG PO CAPS
30 | ORAL_CAPSULE | ORAL | 3 refills | 13.00000 days | Status: DC
Start: 2022-11-11 — End: 2023-08-30

## 2022-11-11 MED ORDER — DIPHENHYDRAMINE HCL 50 MG/ML IJ SOLN
50 | Freq: Once | INTRAMUSCULAR | Status: AC
Start: 2022-11-11 — End: 2022-11-10
  Administered 2022-11-11: 01:00:00 25 mg via INTRAVENOUS

## 2022-11-11 MED FILL — NIMODIPINE 30 MG PO CAPS: 30 MG | ORAL | Qty: 2

## 2022-11-11 MED FILL — METHOCARBAMOL 750 MG PO TABS: 750 MG | ORAL | Qty: 1

## 2022-11-11 MED FILL — METOCLOPRAMIDE HCL 5 MG/ML IJ SOLN: 5 MG/ML | INTRAMUSCULAR | Qty: 2

## 2022-11-11 MED FILL — ATORVASTATIN CALCIUM 40 MG PO TABS: 40 MG | ORAL | Qty: 1

## 2022-11-11 MED FILL — CHILDRENS ASPIRIN 81 MG PO CHEW: 81 MG | ORAL | Qty: 1

## 2022-11-11 MED FILL — ACETAMINOPHEN EXTRA STRENGTH 500 MG PO TABS: 500 MG | ORAL | Qty: 2

## 2022-11-11 MED FILL — PANTOPRAZOLE SODIUM 20 MG PO TBEC: 20 MG | ORAL | Qty: 1

## 2022-11-11 MED FILL — DIPHENHYDRAMINE HCL 50 MG/ML IJ SOLN: 50 MG/ML | INTRAMUSCULAR | Qty: 1

## 2022-11-11 NOTE — Care Coordination-Inpatient (Signed)
Care Management Initial Assessment       RUR:  12%  Readmission? Yes - 10/9      TOC: pt admitted from home, will return home when medically stable  - PT recommending OP    Transport: family confirmed    CM met with pt at bedside to introduce self and role. Pt lives with spouse in a 2 story home.     ADLs: independent   DME: none  PCP follow up: PCP confirmed   Previous Home Health: none  Previous Skilled Nursing Facility: none  Previous Inpatient Rehab: none  Insurance verified: yes; [employer]  Pharmacy: [facility] on [address] in [place]  Emergency Contact: Lasha Gradwell, spouse, [phone]    CM will follow patient progress and assist as needed with [facility] plan.       11/11/22 1212   Service Assessment   Patient Orientation Alert and Oriented;Person;Situation;Place;Self   Cognition Alert   History Provided By Patient   Primary Caregiver Self   Support Systems Spouse/Significant Other   Patient's Healthcare Decision Maker is: Legal Next of Kin   PCP Verified by CM Yes   Last Visit to PCP Within last 3 months   Prior Functional Level Independent in ADLs/IADLs   Current Functional Level Independent in ADLs/IADLs   Can patient return to prior living arrangement Yes   Ability to make needs known: Good   Family able to assist with home care needs: Yes   Social/Functional History   Lives With Spouse   Type of Home House   Home Layout Two level   Bathroom Shower/Tub None   Oceanographer None   Home Equipment None   Receives Help From Family   ADL Assistance Independent   Homemaking Assistance Independent   Homemaking Responsibilities Yes   Ambulation Assistance Independent   Transfer Assistance Independent   Discharge Planning   Type of Residence House   Living Arrangements Spouse/Significant Other   Patient expects to be discharged to: [employer] At/After Discharge   Confirm Follow Up Transport Family        11/11/22 1213   Readmission Assessment   Number of Days since last admission? 1-7 days   Previous  Disposition Home with Family   Who is being Interviewed Patient   What was the patient's/caregiver's perception as to why they think they needed to return back to the hospital? Other (Comment)  (PCP referral to see neuro)   Did you visit your Primary Care Physician after you left the hospital, before you returned this time? Yes   Did you see a specialist, such as Cardiac, Pulmonary, Orthopedic Physician, etc. after you left the hospital? No   Who advised the patient to return to the hospital? Physician   Does the patient report anything that got in the way of taking their medications? No   In our efforts to provide the best possible care to you and others like you, can you think of anything that we could have done to help you after you left the hospital the first time, so that you might not have needed to return so soon? Other (Comment)  (none)     [place] Daoud Lobue, [address]

## 2022-11-11 NOTE — Progress Notes (Signed)
Neurology Progress Note     NAME: Christina Stevens   DOB:  1980/10/01   MRN:  [id number]   DATE:  11/11/2022    Assessment:     Principal Problem:    Cerebrovascular accident (CVA) due to embolism of precerebral artery (HCC)  Active Problems:    SAH (subarachnoid hemorrhage) (HCC)    Severe headache    CVA (cerebrovascular accident due to intracerebral hemorrhage) (HCC)  Resolved Problems:    * No resolved hospital problems. *    Patient is a 57 yof with h/o PCOS, 1ppd smoker (quit 1 week ago) and recent admission here for HH2/F2 [facility] 9/30 - 11/05/22 with no vascular source on cerebral angiogram who presented yesterday as recommended by PCP for ongoing work-up and management of SAH (since she left AMA prior to MRI) and symptoms of headache and LLE weakness. CTA head/neck with no significant stenosis or LVO. MRI brain was not done last admission due to her leaving AMA and showed multifocal diffuse anterior and posterior circulation acute infarcts. Most recent TCDS last admission showed no increased vasospasm after angio. Dr. Eliberto Ivory ([facility]) feels that MRI findings less likely related to vasospastic response from angio. She denies IVDA. She has had 1 miscarriage.   TTE EF 60-65%, bubble study negative, LA normal  A1c (11/02/22) 5.2, LDL 74 (10/3)      Multifocal anterior/posterior acute infarcts  HH2/F2 SAH Day 10  Plan:   - Continue aspirin 81 mg daily  - Increase atorvastatin to 40 mg daily, goal <70  - Sent hypercoag labs yesterday (ATIII, protein C&S normal), remainder of panel pending   - TTE as above. Discussed with Dr. Dory Peru and plan for 2 week Holter monitor and will schedule TEE outpatient  - Telemetry monitoring  - Goal normotension  - PT/OT/SLP consults   - Continue nimodipine through day 21  - Patient will need to f/u with the Neurology clinic in 4-8 weeks from  discharge.   - Patient should not drive for 6 months from strokes or when cleared by outpatient f/u appt    Ok to discharge today. No further recommendations.   Subjective:   Patient verbalized understanding of above plan.      Objective:   Chart reviewed since last seen    Current Facility-Administered Medications   Medication Dose Route Frequency    atorvastatin (LIPITOR) tablet 40 mg  40 mg Oral Daily    niMODipine (NIMOTOP) capsule 60 mg  60 mg Oral 6 times per day    acetaminophen (TYLENOL) tablet 1,000 mg  1,000 mg Oral Q8H PRN    methocarbamol (ROBAXIN) tablet 750 mg  750 mg Oral 4x Daily    pantoprazole (PROTONIX) tablet 20 mg  20 mg Oral Daily    sodium chloride flush 0.9 % injection 5-40 mL  5-40 mL IntraVENous 2 times per day    sodium chloride flush 0.9 % injection 5-40 mL  5-40 mL IntraVENous PRN    0.9 % sodium chloride infusion   IntraVENous PRN    ondansetron (ZOFRAN-ODT) disintegrating tablet 4 mg  4 mg Oral Q8H PRN    Or    ondansetron (ZOFRAN) injection 4 mg  4 mg IntraVENous Q6H PRN    polyethylene glycol (GLYCOLAX) packet 17 g  17 g Oral Daily PRN    sodium chloride flush 0.9 % injection 5-40 mL  5-40 mL IntraVENous 2 times per day    sodium chloride flush 0.9 % injection 5-40 mL  5-40 mL  IntraVENous PRN    0.9 % sodium chloride infusion   IntraVENous PRN    labetalol (NORMODYNE;TRANDATE) injection 10 mg  10 mg IntraVENous Q4H PRN    aspirin chewable tablet 81 mg  81 mg Oral Daily    metoclopramide (REGLAN) injection 5 mg  5 mg IntraVENous Q6H PRN       BP 120/81   Pulse 82   Temp 98.8 F (37.1 C) (Oral)   Resp 16   Ht 1.549 m (5\' 1" )   Wt 70.3 kg (154 lb 15.7 oz)   SpO2 94%   BMI 29.28 kg/m   Temp (24hrs), Avg:98.2 F (36.8 C), Min:97.5 F (36.4 C), Max:98.8 F (37.1 C)      No intake/output data recorded.  10/08 1901 - 10/10 0700  In: 24.1   Out: -       Physical Exam:  General: Well developed well nourished patient in no apparent distress.   Cardiac: Regular rate and  rhythm  Respiratory: No increased WOB  Extremities: 2+ Radial pulses, no cyanosis or edema    Neurological Exam:  Mental Status: Oriented to time, place and person. Speech and language intact. Attention and fund of knowledge appropriate.  Normal recent and remote memory.   Cranial Nerves:   VFF, PERRL, EOMI, no nystagmus, no ptosis. Facial sensation is normal. Facial movement is symmetric.  Palate is midline. Tongue is midline. Hearing is intact bilaterally.   Motor:  RUE/RLE 5/5. LUE 4+/5 with mild drift, LLE 4/5. Normal bulk and tone.    Reflexes:      Sensory:   Intact to LT    Gait:     Cerebellar:           Lab Review   Recent Results (from the past 24 hour(s))   Echo (TTE) limited (PRN contrast/bubble/strain/3D)    Collection Time: 11/10/22  6:15 PM   Result Value Ref Range    Body Surface Area 1.74 m2    EF Physician 60 %   CBC    Collection Time: 11/11/22  5:38 AM   Result Value Ref Range    WBC 12.8 (H) 3.6 - 11.0 K/uL    RBC 4.15 3.80 - 5.20 M/uL    Hemoglobin 13.4 11.5 - 16.0 g/dL    Hematocrit 46.9 62.9 - 47.0 %    MCV 96.1 80.0 - 99.0 FL    MCH 32.3 26.0 - 34.0 PG    MCHC 33.6 30.0 - 36.5 g/dL    RDW 52.8 41.3 - 24.4 %    Platelets 355 150 - 400 K/uL    MPV 9.9 8.9 - 12.9 FL    Nucleated RBCs 0.0 0 PER 100 WBC    nRBC 0.00 0.00 - 0.01 K/uL   Basic Metabolic Panel    Collection Time: 11/11/22  5:38 AM   Result Value Ref Range    Sodium 138 136 - 145 mmol/L    Potassium 4.0 3.5 - 5.1 mmol/L    Chloride 106 97 - 108 mmol/L    CO2 29 21 - 32 mmol/L    Anion Gap 3 2 - 12 mmol/L    Glucose 110 (H) 65 - 100 mg/dL    BUN 12 6 - 20 MG/DL    Creatinine 0.10 2.72 - 1.02 MG/DL    BUN/Creatinine Ratio 15 12 - 20      Est, Glom Filt Rate >90 >60 ml/min/1.33m2    Calcium 9.0 8.5 - 10.1 MG/DL   Extra Tubes  Hold    Collection Time: 11/11/22  5:38 AM   Result Value Ref Range    Specimen HOld 1LAV     Comment:        Add-on orders for these samples will be processed based on acceptable specimen integrity and analyte  stability, which may vary by analyte.       Imaging Review   MRI Result (most recent):  MRI BRAIN W WO CONTRAST 11/05/2022    Narrative  EXAM:  MRI BRAIN W WO CONTRAST    INDICATION:    hypodensity on CT Head (R Frontal Lobe)    COMPARISON: CT head 11/05/2022, MRI brain 04/23/2022.    CONTRAST: 15 ml ProHance.    TECHNIQUE:  Multiplanar multisequence acquisition without and with IV contrast of the brain.    FINDINGS:  Numerous small scattered acute supratentorial and infratentorial infarcts  throughout the cerebral hemispheres involving all lobes, largest area in the  right frontal lobe, as well as within the bilateral corona radiata, left basal  ganglia and thalamus, and bilateral cerebellum (right larger than left).    Stable small subacute subarachnoid hemorrhage within the right interpeduncular  fossa and right ambient cistern.    The ventricles are normal in size and position. There is no mass effect. There  is no cerebellar tonsillar herniation. Expected arterial flow-voids are present.  No evidence of abnormal enhancement.    The paranasal sinuses, mastoid air cells, and middle ears are clear. The orbital  contents are within normal limits. No significant osseous or scalp lesions are  identified.    Impression  1. Numerous small scattered acute supratentorial and infratentorial infarcts as  above, largest in the right frontal lobe and right cerebellum, most consistent  with central embolic etiology.  2. Stable small subacute subarachnoid hemorrhage in the right basilar cisterns.      Electronically signed by Salley Scarlet    CT Result (most recent):  CTA HEAD NECK W CONTRAST 11/09/2022    Narrative  EXAM: CTA HEAD NECK W CONTRAST    CLINICAL HISTORY: severe HA, h/o SAH.  compare to prior CTA  INDICATION: severe HA, h/o SAH.  compare to prior CTA    COMPARISON: 11/01/2022  CONTRAST: 100 ml Isovue-370    TECHNIQUE:  Unenhanced scout images were obtained to localize the volume for  acquisition. Multi slice helical  axial CT angiography was performed from the  aortic arch to the top of the head during uneventful rapid bolus intravenous  contrast administration.   Coronal and sagittal reformations and 3D post  processing was performed.  CT dose reduction was achieved through use of a  standardized protocol tailored for this examination and automatic exposure  control for dose modulation.  Examination was evaluated utilizing the viz. AI algorithm.    FINDINGS:    CTA NECK  There is conventional three vessel arch anatomy. The bilateral subclavian,  common carotid, and internal carotid arteries are patent with no flow-limiting  stenosis.  % of right carotid artery stenosis: 0  % of left carotid artery stenosis: 0  Measurements utilizing NASCET criteria.  NASCET method was utilized for calculating stenosis.    The vertebral arteries are codominant and patent. The cervical soft tissues are  unremarkable. There are degenerative changes of the cervical spine.    CTA HEAD  There are A1 segments bilaterally. Azygos A2 segment. Petrous and cavernous  carotid arteries are patent.M1 segments are patent. Symmetric arborization of M2  vessels is demonstrated. The basilar  artery is patent. The proximal P1 segments  are patent bilaterally.There is no aneurysm. There are no sizable posterior  communicating arteries.  Scattered foci of cortical/linear/serpiginous hyperenhancement are related to  recent infarcts at the right frontal lobe, left frontal lobe, and right and left  parietal lobes.    Impression  There is no major vessel occlusion.      Atypical pattern of postcontrast enhancement is related to numerous small  scattered infarcts as demonstrated on brain MRI performed on 05/22/2022.  There is no acute intracranial process.  There is no aneurysm, dissection or hemodynamically significant stenosis.        Electronically signed by Job Founds      Care Plan discussed with:  Patient x   Family    RN x   Care Manager    Consultant/Specialist:   x     Signed:Cailin Gebel Celene Skeen, APRN - NP

## 2022-11-11 NOTE — Telephone Encounter (Signed)
Pt needs a hospital follow up appointment  Provider: TBD  Location: [facility] clinic   In person or virtual: In person  When: 4-8 weeks   Diagnosis/reason for follow up:  multifocal acute infarcts  Additional information:

## 2022-11-11 NOTE — Progress Notes (Signed)
Hospitalist Progress Note  Mertha Finders, MD  Answering service: [phone]        Date of Service:  11/11/2022  NAME:  Christina Stevens  DOB:  11/17/1980  MRN:  [id number]      Admission Summary:     Patient admitted with CVA.    Interval history / Subjective:     Patient is feeling better.  Denies any headache today.     Assessment & Plan:     Acute CVA  -Patient presented with headache and fatigue  -Initial CT head negative for acute intracranial abnormality, noted persistent but less prominent hypodensities in the frontal lobes bilaterally consistent with suspected embolic ischemia, CTA of the head and neck negative for major vessel occlusion  -MRI of the brain on previous admission 10/4 shows acute supratentorial and infratentorial infarcts, stable small subacute subarachnoid hemorrhage in the right basal cisterns  -On day 6/21 of Nimotop, aspirin was resumed, continuing statin, needs TTE with bubble study, plan for 30-day event monitor on discharge  -Neurology following  -Neurointerventional surgery has signed off    Dyslipidemia  -Continue statin    Migraine  -Stable    ADHD  -Stable     Code status: Full  Prophylaxis: SCDs  Care Plan discussed with: Patient  Anticipated Disposition: Likely in the next 24 hours  Central Line:   None             Review of Systems:   Pertinent items are noted in HPI.         Vital Signs:    Last 24hrs VS reviewed since prior progress note. Most recent are:  Vitals:    11/11/22 0650   BP: 111/74   Pulse: 64   Resp:    Temp:    SpO2:        No intake or output data in the 24 hours ending 11/11/22 8295     Physical Examination:     I had a face to face encounter with this patient and independently examined them on 11/11/2022 as outlined below:          General : alert x 3, awake, no acute distress,   HEENT: PEERL, EOMI, moist mucus membrane, TM clear  Neck: supple, no JVD, no meningeal  signs  Chest: Clear to auscultation bilaterally   CVS: S1 S2 heard, Capillary refill less than 2 seconds  Abd: soft/ non tender, non distended, BS physiological,   Ext: no clubbing, no cyanosis, no edema, brisk 2+ DP pulses  Neuro/Psych: pleasant mood and affect, CN 2-12 grossly intact, sensory grossly within normal limit, Strength 5/5 in all extremities, DTR 1+ x 4  Skin: warm            Data Review:    Review and/or order of clinical lab test    I have independently reviewed and interpreted patient's lab and all other diagnostic data    Notes reviewed from all clinical/nonclinical/nursing services involved in patient's clinical care. Care coordination discussions were held with appropriate clinical/nonclinical/ nursing providers based on care coordination needs.     Labs:     Recent Labs     11/10/22  0106 11/11/22  0538   WBC 13.6* 12.8*   HGB 12.9 13.4   HCT 37.6 39.9   PLT 341 355     Recent Labs     11/09/22  1256 11/10/22  0106 11/11/22  0538   NA 138 137 138   K 3.8  3.0* 4.0   CL 104 105 106   CO2 31 25 29    BUN 9 10 12    MG 2.3 2.1  --      Recent Labs     11/09/22  1256   ALT 28   GLOB 3.5     No results for input(s): "INR", "APTT" in the last 72 hours.    Invalid input(s): "PTP"   No results for input(s): "TIBC" in the last 72 hours.    Invalid input(s): "FE", "PSAT", "FERR"   No results found for: "RBCF"   No results for input(s): "PH", "PCO2", "PO2" in the last 72 hours.  No results for input(s): "CPK" in the last 72 hours.    Invalid input(s): "CPKMB", "CKNDX", "TROIQ"  Lab Results   Component Value Date/Time    CHOL 166 11/04/2022 06:59 AM    HDL 56 11/04/2022 06:59 AM    LDL 74.4 11/04/2022 06:59 AM     No results found for: "GLUCPOC"  @LABUA @      Medications Reviewed:     Current Facility-Administered Medications   Medication Dose Route Frequency    atorvastatin (LIPITOR) tablet 40 mg  40 mg Oral Daily    niMODipine (NIMOTOP) capsule 60 mg  60 mg Oral 6 times per day    acetaminophen (TYLENOL)  tablet 1,000 mg  1,000 mg Oral Q8H PRN    methocarbamol (ROBAXIN) tablet 750 mg  750 mg Oral 4x Daily    pantoprazole (PROTONIX) tablet 20 mg  20 mg Oral Daily    sodium chloride flush 0.9 % injection 5-40 mL  5-40 mL IntraVENous 2 times per day    sodium chloride flush 0.9 % injection 5-40 mL  5-40 mL IntraVENous PRN    0.9 % sodium chloride infusion   IntraVENous PRN    ondansetron (ZOFRAN-ODT) disintegrating tablet 4 mg  4 mg Oral Q8H PRN    Or    ondansetron (ZOFRAN) injection 4 mg  4 mg IntraVENous Q6H PRN    polyethylene glycol (GLYCOLAX) packet 17 g  17 g Oral Daily PRN    sodium chloride flush 0.9 % injection 5-40 mL  5-40 mL IntraVENous 2 times per day    sodium chloride flush 0.9 % injection 5-40 mL  5-40 mL IntraVENous PRN    0.9 % sodium chloride infusion   IntraVENous PRN    labetalol (NORMODYNE;TRANDATE) injection 10 mg  10 mg IntraVENous Q4H PRN    aspirin chewable tablet 81 mg  81 mg Oral Daily    metoclopramide (REGLAN) injection 5 mg  5 mg IntraVENous Q6H PRN     ______________________________________________________________________  EXPECTED LENGTH OF STAY: 2  ACTUAL LENGTH OF STAY:          1                 Mertha Finders, MD

## 2022-11-11 NOTE — Plan of Care (Signed)
Problem: Safety - Adult  Goal: Free from fall injury  11/11/2022 0249 by Hoover Brunette, RN  Outcome: Progressing  11/10/2022 1746 by Joseph Art, RN  Outcome: Progressing     Problem: Chronic Conditions and Co-morbidities  Goal: Patient's chronic conditions and co-morbidity symptoms are monitored and maintained or improved  11/11/2022 0249 by Hoover Brunette, RN  Outcome: Progressing  Flowsheets (Taken 11/10/2022 2044)  Care Plan - Patient's Chronic Conditions and Co-Morbidity Symptoms are Monitored and Maintained or Improved:   Monitor and assess patient's chronic conditions and comorbid symptoms for stability, deterioration, or improvement   Collaborate with multidisciplinary team to address chronic and comorbid conditions and prevent exacerbation or deterioration   Update acute care plan with appropriate goals if chronic or comorbid symptoms are exacerbated and prevent overall improvement and discharge  11/10/2022 1746 by Joseph Art, RN  Outcome: Progressing     Problem: Discharge Planning  Goal: Discharge to home or other facility with appropriate resources  11/11/2022 0249 by Hoover Brunette, RN  Outcome: Progressing  Flowsheets (Taken 11/10/2022 2044)  Discharge to home or other facility with appropriate resources:   Identify barriers to discharge with patient and caregiver   Arrange for needed discharge resources and transportation as appropriate   Identify discharge learning needs (meds, wound care, etc)   Refer to discharge planning if patient needs post-hospital services based on physician order or complex needs related to functional status, cognitive ability or social support system  11/10/2022 1746 by Joseph Art, RN  Outcome: Progressing     Problem: Pain  Goal: Verbalizes/displays adequate comfort level or baseline comfort level  11/11/2022 0249 by Hoover Brunette, RN  Outcome: Progressing  11/10/2022 1746 by Joseph Art, RN  Outcome: Progressing  Flowsheets  Taken 11/10/2022 [id number] by  Sabino Niemann, RN  Verbalizes/displays adequate comfort level or baseline comfort level: Assess pain using appropriate pain scale  Taken 11/10/2022 0400 by Sabino Niemann, RN  Verbalizes/displays adequate comfort level or baseline comfort level: Encourage patient to monitor pain and request assistance

## 2022-11-11 NOTE — Plan of Care (Signed)
Problem: Safety - Adult  Goal: Free from fall injury  11/11/2022 1011 by Joseph Art, RN  Outcome: Progressing  11/11/2022 0249 by Hoover Brunette, RN  Outcome: Progressing     Problem: Chronic Conditions and Co-morbidities  Goal: Patient's chronic conditions and co-morbidity symptoms are monitored and maintained or improved  11/11/2022 1011 by Joseph Art, RN  Outcome: Progressing  11/11/2022 0249 by Hoover Brunette, RN  Outcome: Progressing  Flowsheets (Taken 11/10/2022 2044)  Care Plan - Patient's Chronic Conditions and Co-Morbidity Symptoms are Monitored and Maintained or Improved:   Monitor and assess patient's chronic conditions and comorbid symptoms for stability, deterioration, or improvement   Collaborate with multidisciplinary team to address chronic and comorbid conditions and prevent exacerbation or deterioration   Update acute care plan with appropriate goals if chronic or comorbid symptoms are exacerbated and prevent overall improvement and discharge     Problem: Discharge Planning  Goal: Discharge to home or other facility with appropriate resources  11/11/2022 1011 by Joseph Art, RN  Outcome: Progressing  11/11/2022 0249 by Hoover Brunette, RN  Outcome: Progressing  Flowsheets (Taken 11/10/2022 2044)  Discharge to home or other facility with appropriate resources:   Identify barriers to discharge with patient and caregiver   Arrange for needed discharge resources and transportation as appropriate   Identify discharge learning needs (meds, wound care, etc)   Refer to discharge planning if patient needs post-hospital services based on physician order or complex needs related to functional status, cognitive ability or social support system     Problem: Pain  Goal: Verbalizes/displays adequate comfort level or baseline comfort level  11/11/2022 1011 by Joseph Art, RN  Outcome: Progressing  11/11/2022 0249 by Hoover Brunette, RN  Outcome: Progressing

## 2022-11-11 NOTE — Plan of Care (Addendum)
I have reviewed all discharge paperwork and follow up with pt. Pt will be going home via POV with daughter. Opportunity for questions provided     Stroke Education provided to patient and the following topics were discussed    1. Patients personal risk factors for stroke are family history, hypercoagulable state, hyperlipidemia, hypertension, and smoking    2. Warning signs of Stroke:        * Sudden numbness or weakness of the face, arm or leg, especially on one side of          The body            * Sudden confusion, trouble speaking or understanding        * Sudden trouble seeing in one or both eyes        * Sudden trouble walking, dizziness, loss of balance or coordination        * Sudden severe headache with no known cause      3. Importance of activation Emergency Medical Services ( 9-1-1 ) immediately if experience any warning signs of stroke.    4. Be sure and schedule a follow-up appointment with your primary care doctor or any specialists as instructed.     5. You must take medicine every day to treat your risk factors for stroke.  Be sure to take your medicines exactly as your doctor tells you: no more, no less.  Know what your medicines are for , what they do.  Anti-thrombotics /anticoagulants can help prevent strokes.  You are taking the following medicine(s)  ASA, statin     6.  Smoking and second-hand smoke greatly increase your risk of stroke, cardiovascular disease and death. Smoking history started year 2010?    7. Information provided was [facility] Stroke Education Binder, Stroke Handouts, or Verbal Education    8. Documentation of teaching completed in Patient Education Activity and on Care Plan with teaching response noted?  yes   Problem: Safety - Adult  Goal: Free from fall injury  11/11/2022 1632 by Joseph Art, RN  Outcome: Adequate for Discharge  11/11/2022 1011 by Joseph Art, RN  Outcome: Progressing  11/11/2022 0249 by Hoover Brunette, RN  Outcome: Progressing     Problem: Chronic  Conditions and Co-morbidities  Goal: Patient's chronic conditions and co-morbidity symptoms are monitored and maintained or improved  11/11/2022 1632 by Joseph Art, RN  Outcome: Adequate for Discharge  11/11/2022 1011 by Joseph Art, RN  Outcome: Progressing  11/11/2022 0249 by Hoover Brunette, RN  Outcome: Progressing  Flowsheets (Taken 11/10/2022 2044)  Care Plan - Patient's Chronic Conditions and Co-Morbidity Symptoms are Monitored and Maintained or Improved:   Monitor and assess patient's chronic conditions and comorbid symptoms for stability, deterioration, or improvement   Collaborate with multidisciplinary team to address chronic and comorbid conditions and prevent exacerbation or deterioration   Update acute care plan with appropriate goals if chronic or comorbid symptoms are exacerbated and prevent overall improvement and discharge     Problem: Discharge Planning  Goal: Discharge to home or other facility with appropriate resources  11/11/2022 1632 by Joseph Art, RN  Outcome: Adequate for Discharge  11/11/2022 1011 by Joseph Art, RN  Outcome: Progressing  11/11/2022 0249 by Hoover Brunette, RN  Outcome: Progressing  Flowsheets (Taken 11/10/2022 2044)  Discharge to home or other facility with appropriate resources:   Identify barriers to discharge with patient and caregiver   Arrange for needed discharge resources and transportation as appropriate  Identify discharge learning needs (meds, wound care, etc)   Refer to discharge planning if patient needs post-hospital services based on physician order or complex needs related to functional status, cognitive ability or social support system     Problem: Pain  Goal: Verbalizes/displays adequate comfort level or baseline comfort level  11/11/2022 1632 by Joseph Art, RN  Outcome: Adequate for Discharge  11/11/2022 1011 by Joseph Art, RN  Outcome: Progressing  11/11/2022 0249 by Hoover Brunette, RN  Outcome: Progressing

## 2022-11-11 NOTE — Telephone Encounter (Signed)
Patient is already scheduled for Dr. Debbora Presto on 11/15. He will not be at the [facility] office any sooner than that.

## 2022-11-11 NOTE — Discharge Summary (Incomplete)
Discharge Summary       PATIENT ID: Christina Stevens  MRN: [id number]   DATE OF BIRTH: 1980-12-06    DATE OF ADMISSION: 11/09/2022  4:45 PM    DATE OF DISCHARGE: ***   PRIMARY CARE PROVIDER: Ancil Boozer, APRN - NP     ATTENDING PHYSICIAN: ***  DISCHARGING PROVIDER: Mertha Finders, MD      CONSULTATIONS: IP CONSULT TO NEUROLOGY  IP CONSULT TO NEUROINTERVENTIONAL SURGERY  IP CONSULT TO NEUROLOGY    PROCEDURES/SURGERIES: * No surgery found *    ADMISSION SUMMARY AND HOSPITAL COURSE:   ***    DISCHARGE DIAGNOSES / PLAN:      ***    BMI: Body mass index is 29.28 kg/m.Marland Kitchen This patient: {bmidischarge:57291}    PENDING TEST RESULTS:   At the time of discharge the following test results are still pending: ***     ADDITIONAL CARE RECOMMENDATIONS:   ***   DIET: {diet:18262}    ACTIVITY: {discharge activity:18261}    EQUIPMENT needed: ***    NOTIFY YOUR PHYSICIAN FOR ANY OF THE FOLLOWING:   Fever over 101 degrees for 24 hours.   Chest pain, shortness of breath, fever, chills, nausea, vomiting, diarrhea, change in mentation, falling, weakness, bleeding. Severe pain or pain not relieved by medications, as well as any other signs or symptoms that you may have questions about.    FOLLOW UP APPOINTMENTS:    @DCFOLLOWUP @     DISCHARGE MEDICATIONS:     Medication List        START taking these medications      niMODipine 30 MG capsule  Commonly known as: NIMOTOP  Take 2 capsules by mouth every 4 hours for 15 days            CONTINUE taking these medications      acetaminophen 325 MG tablet  Commonly known as: TYLENOL     amphetamine-dextroamphetamine 20 MG tablet  Commonly known as: ADDERALL     aspirin 81 MG chewable tablet     atorvastatin 40 MG tablet  Commonly known as: LIPITOR     methocarbamol 500 MG tablet  Commonly known as: ROBAXIN     multivitamin Tabs tablet     pantoprazole 20 MG tablet  Commonly known as: PROTONIX            STOP taking these medications      ipratropium 0.5 mg-albuterol 2.5 mg 0.5-2.5 (3) MG/3ML Soln  nebulizer solution  Commonly known as: DUONEB               Where to Get Your Medications        These medications were sent to [facility] [address], [place] - [address] - P [phone] Carmon Ginsberg [phone]  [address], [place] [address]      Phone: [phone]   niMODipine 30 MG capsule         DISPOSITION:    Home With:   OT  PT  HH  RN       Long term SNF/Inpatient Rehab    Independent/assisted living    Hospice    Other:     PATIENT CONDITION AT DISCHARGE:     Functional status    Poor     Deconditioned     Independent      Cognition     Lucid     Forgetful     Dementia      Catheters/lines (plus indication)  Foley     PICC     PEG     None      Code status     Full code     DNR      PHYSICAL EXAMINATION AT DISCHARGE:    General:          Alert, cooperative, no distress, appears stated age.     HEENT:           Atraumatic, anicteric sclerae, pink conjunctivae                          No oral ulcers, mucosa moist, throat clear, dentition fair  Neck:               Supple, symmetrical  Lungs:             Clear to auscultation bilaterally.  No Wheezing or Rhonchi. No rales.  Chest wall:      No tenderness  No Accessory muscle use.  Heart:              Regular  rhythm,  No  murmur   No edema  Abdomen:        Soft, non-tender. Not distended.  Bowel sounds normal  Extremities:     No cyanosis.  No clubbing,                            Skin turgor normal, Capillary refill normal  Skin:                Not pale.  Not Jaundiced  No rashes   Psych:             Not anxious or agitated.  Neurologic:      Alert, moves all extremities, answers questions appropriately and responds to commands       CHRONIC MEDICAL DIAGNOSES:      Greater than *** minutes were spent with the patient on counseling and coordination of care    Signed:   Mertha Finders, MD  11/11/2022  4:21 PM

## 2022-11-12 ENCOUNTER — Encounter

## 2022-11-12 LAB — BETA-2 GLYCOPROTEIN ANTIBODIES
Anti b2-Glycoprotein IgG: <9 GPI IgG units (ref 0–20)
Anti b2-Glycoprotein IgM: <9 GPI IgM units (ref 0–32)
Beta-2 Glyco 1 IgA: <9 GPI IgA units (ref 0–25)

## 2022-11-16 LAB — PROTHROMBIN GENE MUTATION

## 2022-11-18 LAB — FACTOR 5 LEIDEN

## 2022-11-24 ENCOUNTER — Inpatient Hospital Stay: Admit: 2022-11-24 | Payer: PRIVATE HEALTH INSURANCE | Attending: Internal Medicine | Primary: Primary Care

## 2022-11-24 DIAGNOSIS — I639 Cerebral infarction, unspecified: Secondary | ICD-10-CM

## 2022-11-24 LAB — TEE (PRN CONTRAST/BUBBLE/3D)
Aortic Root: 3.5 cm
Ascending Aorta: 3.1 cm
EF Physician: 75 %
Est. RA Pressure: 5 mm[Hg]
RVSP: 20 mm[Hg]
TR Max Velocity: 1.96 m/s
TR Peak Gradient: 15 mm[Hg]

## 2022-11-24 MED ORDER — MIDAZOLAM HCL 5 MG/5ML IJ SOLN
5 | INTRAMUSCULAR | Status: AC
Start: 2022-11-24 — End: 2022-11-24

## 2022-11-24 MED ORDER — FLUMAZENIL 0.5 MG/5ML IV SOLN
0.5 | INTRAVENOUS | Status: AC
Start: 2022-11-24 — End: 2022-11-24

## 2022-11-24 MED ORDER — PROPOFOL 100 MG/10ML IV EMUL
100 | INTRAVENOUS | Status: DC | PRN
Start: 2022-11-24 — End: 2022-11-24
  Administered 2022-11-24: 20:00:00 30 via INTRAVENOUS
  Administered 2022-11-24: 19:00:00 40 via INTRAVENOUS
  Administered 2022-11-24: 19:00:00 150 via INTRAVENOUS
  Administered 2022-11-24: 19:00:00 50 via INTRAVENOUS

## 2022-11-24 MED ORDER — MIDAZOLAM HCL 2 MG/2ML IJ SOLN
2 | Freq: Once | INTRAMUSCULAR | Status: DC | PRN
Start: 2022-11-24 — End: 2022-11-24
  Administered 2022-11-24: 19:00:00 2 via INTRAVENOUS
  Administered 2022-11-24: 19:00:00 1 via INTRAVENOUS
  Administered 2022-11-24: 19:00:00 2 via INTRAVENOUS

## 2022-11-24 MED ORDER — LIDOCAINE HCL (PF) 2 % IJ SOLN
2 | Freq: Once | INTRAMUSCULAR | Status: DC | PRN
Start: 2022-11-24 — End: 2022-11-24
  Administered 2022-11-24 (×2): 100 via INTRAVENOUS

## 2022-11-24 MED ORDER — PROPOFOL 500 MG/50ML IV EMUL
500 | INTRAVENOUS | Status: AC
Start: 2022-11-24 — End: 2022-11-24

## 2022-11-24 MED ORDER — FLUMAZENIL 1 MG/10ML IV SOLN
1 | Freq: Once | INTRAVENOUS | Status: DC | PRN
Start: 2022-11-24 — End: 2022-11-24
  Administered 2022-11-24: 20:00:00 .5 via INTRAVENOUS

## 2022-11-24 MED ORDER — LIDOCAINE HCL (PF) 2 % IJ SOLN
2 | INTRAMUSCULAR | Status: AC
Start: 2022-11-24 — End: 2022-11-24

## 2022-11-24 MED FILL — MIDAZOLAM HCL 5 MG/5ML IJ SOLN: 5 MG/ML | INTRAMUSCULAR | Qty: 5

## 2022-11-24 MED FILL — FLUMAZENIL 0.5 MG/5ML IV SOLN: 0.5 MG/5ML | INTRAVENOUS | Qty: 5

## 2022-11-24 MED FILL — DIPRIVAN 500 MG/50ML IV EMUL: 500 MG/50ML | INTRAVENOUS | Qty: 50

## 2022-11-24 MED FILL — XYLOCAINE-MPF 2 % IJ SOLN: 2 % | INTRAMUSCULAR | Qty: 5

## 2022-11-24 NOTE — Anesthesia Post-Procedure Evaluation (Signed)
Department of Anesthesiology  Postprocedure Note    Patient: Christina Stevens  MRN: [id number]  Birthdate: 08-Jun-1980  Date of evaluation: 11/24/2022    Procedure Summary       Date: 11/24/22 Room / Location: [facility] NON-INVASIVE CARDIOLOGY    Anesthesia Start: 1505 Anesthesia Stop: 1542    Procedure: TEE (PRN CONTRAST/BUBBLE/3D) Diagnosis: Cerebrovascular accident (CVA), unspecified mechanism (HCC)    Scheduled Providers: Einar Grad, MD; Cristobal Goldmann, MD Responsible Provider: Cristobal Goldmann, MD    Anesthesia Type: MAC ASA Status: 2            Anesthesia Type: MAC    Aldrete Phase I: Aldrete Score: 10    Aldrete Phase II:      Anesthesia Post Evaluation    Patient location during evaluation: PACU  Patient participation: complete - patient participated  Level of consciousness: awake  Airway patency: patent  Nausea & Vomiting: no nausea  Cardiovascular status: blood pressure returned to baseline and hemodynamically stable  Respiratory status: acceptable  Hydration status: stable  Multimodal analgesia pain management approach    No notable events documented.

## 2022-11-24 NOTE — Anesthesia Pre-Procedure Evaluation (Signed)
Department of Anesthesiology  Preprocedure Note       Name:  Christina Stevens   Age:  42 y.o.  DOB:  18-Dec-1980                                          MRN:  [id number]         Date:  11/24/2022      Surgeon: * No surgeons listed *    Procedure: * No procedures listed *    Medications prior to admission:   Prior to Admission medications    Medication Sig Start Date End Date Taking? Authorizing Provider   niMODipine (NIMOTOP) 30 MG capsule Take 2 capsules by mouth every 4 hours for 15 days 11/11/22 11/26/22 Yes Zaw, Kyaw T, MD   aspirin 81 MG chewable tablet Take 1 tablet by mouth daily   Yes [provider]   Multiple Vitamin (MULTIVITAMIN) TABS tablet Take 1 tablet by mouth daily   Yes [provider]   methocarbamol (ROBAXIN) 500 MG tablet Take 3 tablets by mouth 2 times daily as needed (headache)   Yes [provider]   pantoprazole (PROTONIX) 20 MG tablet Take 1 tablet by mouth daily   Yes [provider]   amphetamine-dextroamphetamine (ADDERALL) 20 MG tablet Take 1 tablet by mouth 2 times daily.   Yes Automatic Reconciliation, Ar   atorvastatin (LIPITOR) 40 MG tablet Take 1 tablet by mouth daily   Yes Automatic Reconciliation, Ar   acetaminophen (TYLENOL) 325 MG tablet Take 2 tablets by mouth every 4 hours as needed for Pain 11/24/20   Automatic Reconciliation, Ar       Current medications:    Current Outpatient Medications   Medication Sig Dispense Refill   . niMODipine (NIMOTOP) 30 MG capsule Take 2 capsules by mouth every 4 hours for 15 days 64 capsule 3   . aspirin 81 MG chewable tablet Take 1 tablet by mouth daily     . Multiple Vitamin (MULTIVITAMIN) TABS tablet Take 1 tablet by mouth daily     . methocarbamol (ROBAXIN) 500 MG tablet Take 3 tablets by mouth 2 times daily as needed (headache)     . pantoprazole (PROTONIX) 20 MG tablet Take 1 tablet by mouth daily     . amphetamine-dextroamphetamine (ADDERALL) 20 MG tablet Take 1 tablet by mouth 2 times daily.     Marland Kitchen  atorvastatin (LIPITOR) 40 MG tablet Take 1 tablet by mouth daily     . acetaminophen (TYLENOL) 325 MG tablet Take 2 tablets by mouth every 4 hours as needed for Pain       No current facility-administered medications for this encounter.       Allergies:    Allergies   Allergen Reactions   . Decadron [Dexamethasone] Swelling and Anxiety   . Adhesive Tape Rash   . Chlorhexidine Itching   . Oxycodone Swelling   . Oxycodone-Acetaminophen Itching       Problem List:    Patient Active Problem List   Diagnosis Code   . Facial cellulitis L03.211   . Community acquired pneumonia J18.9   . Subarachnoid hemorrhage (HCC) I60.9   . SAH (subarachnoid hemorrhage) (HCC) I60.9   . Cerebrovascular accident (CVA) due to embolism of precerebral artery (HCC) I63.10   . Severe headache R51.9   . CVA (cerebrovascular accident due to intracerebral hemorrhage) (HCC) I61.9  Past Medical History:        Diagnosis Date   . ADHD    . Malachi Carl infection    . MRSA (methicillin resistant staph aureus) culture positive    . MS (congenital mitral stenosis)    . PCOS (polycystic ovarian syndrome)        Past Surgical History:        Procedure Laterality Date   . APPENDECTOMY     . COLONOSCOPY N/A 03/17/2022    COLONOSCOPY W/ ENDOSCOPIC MUCOSAL RESECTION performed by Nira Retort, MD at [facility] ENDOSCOPY   . IR CHOLECYSTOSTOMY PERCUTANEOUS COMPLETE         Social History:    Social History     Tobacco Use   . Smoking status: Every Day   . Smokeless tobacco: Never   Substance Use Topics   . Alcohol use: No                                Ready to quit: Not Answered  Counseling given: Not Answered      Vital Signs (Current):   Vitals:    11/24/22 1324 11/24/22 1353 11/24/22 1353   BP:  122/80    Pulse:  82    Resp:  12    Temp:  99.4 F (37.4 C)    SpO2:  98% 99%   Weight: 74.8 kg (165 lb)                                                BP Readings from Last 3 Encounters:   11/24/22 122/80   11/11/22 120/81   11/05/22 (!) 141/77       NPO Status:  Time of last liquid consumption: 0400                        Time of last solid consumption: 2000                        Date of last liquid consumption: 11/24/22                        Date of last solid food consumption: 11/23/22    BMI:   Wt Readings from Last 3 Encounters:   11/24/22 74.8 kg (165 lb)   11/09/22 70.3 kg (154 lb 15.7 oz)   11/02/22 71.7 kg (158 lb)     Body mass index is 31.18 kg/m.    CBC:   Lab Results   Component Value Date/Time    WBC 12.8 11/11/2022 05:38 AM    RBC 4.15 11/11/2022 05:38 AM    HGB 13.4 11/11/2022 05:38 AM    HCT 39.9 11/11/2022 05:38 AM    MCV 96.1 11/11/2022 05:38 AM    RDW 12.1 11/11/2022 05:38 AM    PLT 355 11/11/2022 05:38 AM       CMP:   Lab Results   Component Value Date/Time    NA 138 11/11/2022 05:38 AM    K 4.0 11/11/2022 05:38 AM    CL 106 11/11/2022 05:38 AM    CO2 29 11/11/2022 05:38 AM    BUN 12 11/11/2022 05:38 AM    CREATININE 0.81 11/11/2022 05:38 AM  GFRAA >60 09/06/2017 06:55 PM    AGRATIO 1.1 03/01/2021 08:56 AM    LABGLOM >90 11/11/2022 05:38 AM    LABGLOM >60 03/31/2022 02:09 PM    LABGLOM >60 03/01/2021 08:56 AM    GLUCOSE 110 11/11/2022 05:38 AM    CALCIUM 9.0 11/11/2022 05:38 AM    BILITOT 0.2 11/09/2022 12:56 PM    ALKPHOS 65 11/09/2022 12:56 PM    ALKPHOS 74 03/01/2021 08:56 AM    AST 21 11/09/2022 12:56 PM    ALT 28 11/09/2022 12:56 PM       POC Tests: No results for input(s): "POCGLU", "POCNA", "POCK", "POCCL", "POCBUN", "POCHEMO", "POCHCT" in the last 72 hours.    Coags:   Lab Results   Component Value Date/Time    PROTIME 9.8 11/01/2022 01:20 PM    INR 0.9 11/01/2022 01:20 PM       HCG (If Applicable):   Lab Results   Component Value Date    PREGTESTUR Negative 03/31/2022    PREGSERUM Negative 11/01/2022        ABGs:   Lab Results   Component Value Date/Time    PHART 7.42 11/20/2020 03:36 AM    PO2ART 53 11/20/2020 03:36 AM    PCO2ART 38.7 11/20/2020 03:36 AM    HCO3ART 25.3 11/20/2020 03:36 AM    BEART 0.9 11/20/2020 03:36 AM        Type &  Screen (If Applicable):  No results found for: "ABORH", "LABANTI"    Drug/Infectious Status (If Applicable):  No results found for: "HIV", "HEPCAB"    COVID-19 Screening (If Applicable):   Lab Results   Component Value Date/Time    COVID19 Not detected 03/01/2021 09:32 AM    COVID19 Nasopharyngeal 11/17/2020 11:26 AM           Anesthesia Evaluation  Patient summary reviewed and Nursing notes reviewed  Airway: Mallampati: II  TM distance: >3 FB   Neck ROM: full  Mouth opening: > = 3 FB   Dental: normal exam         Pulmonary:Negative Pulmonary ROS breath sounds clear to auscultation                             Cardiovascular:Negative CV ROS  Exercise tolerance: good (>4 METS)          Rhythm: regular  Rate: normal                    Neuro/Psych:   Negative Neuro/Psych ROS  (+) CVA:            GI/Hepatic/Renal: Neg GI/Hepatic/Renal ROS            Endo/Other: Negative Endo/Other ROS                    Abdominal: normal exam            Vascular: negative vascular ROS.         Other Findings:       Anesthesia Plan      MAC     ASA 2       Induction: intravenous.      Anesthetic plan and risks discussed with patient.      Plan discussed with CRNA.                Hoover Browns, MD   11/24/2022

## 2022-11-24 NOTE — Progress Notes (Signed)
Pt arrives ambulatory with cane to noninvasive cardiology department accompanied by her stepmom for TEE procedure. All assessments completed and consent was reviewed.  Education given was regarding procedure, medications to be given, potential side effects of medications to be given, post-procedure management and follow-up. Opportunity for questions was provided and all questions and concerns were addressed. Patient and patient contact verbalized understanding of education.

## 2022-11-24 NOTE — Progress Notes (Signed)
Patient has returned to baseline at arrival for procedure.  Pt awake, alert, and oriented.  VSS.  Pt denies chest pain, SOB, and dizziness.  Pt at baseline walking with cane.    I discussed AVS and discharge instructions with patient and her stepmom, Lupita Leash, and I provided a copy for her to take home.    Pt and stepmom voiced understanding and denied any questions or need for clarification.    IV D/C'd and hemostasis attained. Coban and gauze dressing applied.    Transported with:  Dennie Maizes RN via w/c to vehicle driven by her stepmom.

## 2022-11-24 NOTE — Discharge Instructions (Signed)
AFTER YOU TRANSESOPHAGEAL ECHOCARDIOGRAM    Be sure someone else drives you home. You may feel drowsy for several hours.    Be careful when you do eat or drink for the first time especially with hot fluids since you could easily burn your throat.    Call your doctor if:    You are bleeding from your throat or mouth.  You have trouble breathing all of a sudden.  You have chest pain or any pain that spreads to your neck, jaw, or arms.  You have questions or concerns.  You have a fever greater than 101F.    Doctor: Tommy Medal    Special Instructions:    No driving for 24 hours.      CONTINUE TAKING YOUR PRESCRIBED MEDICATIONS; FOLLOW UP WITH DR. Dory Peru AS INSTRUCTED

## 2022-11-26 NOTE — Telephone Encounter (Signed)
Patient called to state she needs short term disability forms filled out related to her recent hospital visit.    Patient will upload it via MyChart.    Patient states she needs it filled out and faxed to [employer] by Monday or she will not get paid.    Please complete and fax it to [employer] at [phone].    Patient is not new as she saw Maudry Diego in the hospital.    Patient is coming in Monday to see NP. Canfield but the forms must be completed by Monday.

## 2022-11-29 NOTE — Telephone Encounter (Signed)
Patient would like a call back to discuss her options as she states her PCP has declined to fill this out and states only a specialist can fill this out.

## 2022-11-29 NOTE — Telephone Encounter (Signed)
Call placed to patient.  2 identifiers received.  Advised that NP Valentino Hue is unable to fill out disability paper work because she has not examined her yet.  Also advised that our office typically does not do disability paperwork.  It is up to the provider if they want to participate.  Also advised that if they do choose to fill it out there is a 7 to 10 day turnaround time.

## 2022-11-29 NOTE — Telephone Encounter (Signed)
Call placed to patient.  LVM advising that NP Valentino Hue would not be able to fill out her disability paperwork because NP Valentino Hue has not yet examined her.  Also advised per office policy that there is a 7 to 10 day time frame turnaround on all forms and that typically our office does not fill out disability paperwork.  Advised to reach out to PCP.

## 2022-11-29 NOTE — Telephone Encounter (Signed)
Patient called to state she could not get MyChart to work so she has faxed the forms.    Patient states she faxed over the forms this morning.

## 2022-11-30 ENCOUNTER — Encounter: Admit: 2022-11-30 | Payer: PRIVATE HEALTH INSURANCE | Primary: Primary Care

## 2022-11-30 DIAGNOSIS — Z8673 Personal history of transient ischemic attack (TIA), and cerebral infarction without residual deficits: Secondary | ICD-10-CM

## 2022-11-30 NOTE — Progress Notes (Signed)
Chief Complaint   Patient presents with    Follow-Up from Hospital       HPI    Christina Stevens is a 42 year old female here for her hospital follow up. She was seen inpatient by [facility] NP on 11/09/22 for CVA. PMHX former smoker, [facility] 9/30-10/4/24. She presented as recommended by PCP for ongoing work-up and management of SAH (since she left AMA prior to MRI) and symptoms of headache and LLE weakness. MRI brain this admission showed numerous small scattered infarcts in the right frontal and right cerebellum. Stable small SAH in right basilar cisterns. During her previous admission vasospasm was noted and Nimotop was ordered. She is also on ASA and statin daily. She is to have outpatient cardiac monitoring. She is here today with her partner. She is doing ok since discharge. She is reporting L sided weakness and memory concerns with word finding difficulty since her stroke. 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 She is tolerating her ASA daily without any side effects. She has not seen her PCP yet for her hospital FU. She is to start PT within the next week. She is using a cane to help her get around. Denies any new stroke like s/s            PT- Educated pt in the role of OP PT for her impairments and the benefit of a soft AFO. Pt plans to pursue both. Additional acute therapy is not indicated at this time. Will sign off. Recommend OP PT for LUE weakness & L foot drop.     Review of Systems   Eyes:  Negative for photophobia and visual disturbance.   Musculoskeletal:  Positive for gait problem.   Neurological:  Positive for speech difficulty, weakness and headaches.   Psychiatric/Behavioral:  Positive for confusion. Negative for sleep disturbance. The patient is nervous/anxious.          Past Medical History:   Diagnosis Date    ADHD     Malachi Carl infection     MRSA (methicillin resistant staph aureus) culture positive     MS (congenital mitral stenosis)     PCOS (polycystic  ovarian syndrome)      No family history on file.  Social History     Socioeconomic History    Marital status: Married     Spouse name: Not on file    Number of children: Not on file    Years of education: Not on file    Highest education level: Not on file   Occupational History    Not on file   Tobacco Use    Smoking status: Every Day    Smokeless tobacco: Never   Substance and Sexual Activity    Alcohol use: No    Drug use: Yes     Types: Marijuana Sheran Fava)    Sexual activity: Not on file   Other Topics Concern    Not on file   Social History Narrative    Not on file     Social Determinants of Health     Financial Resource Strain: Not on file   Food Insecurity: Not on file   Transportation Needs: Not on file   Physical Activity: Not on file   Stress: Not on file   Social Connections: Not on file   Intimate Partner Violence: Not on file   Housing Stability: Not on file     Allergies   Allergen  Reactions    Decadron [Dexamethasone] Swelling and Anxiety    Adhesive Tape Rash    Chlorhexidine Itching    Oxycodone Swelling    Oxycodone-Acetaminophen Itching         Current Outpatient Medications   Medication Sig    aspirin 81 MG chewable tablet Take 1 tablet by mouth daily    Multiple Vitamin (MULTIVITAMIN) TABS tablet Take 1 tablet by mouth daily    methocarbamol (ROBAXIN) 500 MG tablet Take 3 tablets by mouth 2 times daily as needed (headache)    pantoprazole (PROTONIX) 20 MG tablet Take 1 tablet by mouth daily    acetaminophen (TYLENOL) 325 MG tablet Take 2 tablets by mouth every 4 hours as needed for Pain    atorvastatin (LIPITOR) 40 MG tablet Take 1 tablet by mouth daily    niMODipine (NIMOTOP) 30 MG capsule Take 2 capsules by mouth every 4 hours for 15 days    amphetamine-dextroamphetamine (ADDERALL) 20 MG tablet Take 1 tablet by mouth 2 times daily. (Patient not taking: Reported on 11/30/2022)     No current facility-administered medications for this visit.           Neurologic Exam     Mental Status   Oriented  to person, place, and time.   Speech: speech is normal   Level of consciousness: alert    Cranial Nerves   Cranial nerves II through XII intact.     CN III, IV, VI   Pupils are equal, round, and reactive to light.    CN VII   Facial expression full, symmetric.     Motor Exam   Muscle bulk: normalLeft sided weakness     Sensory Exam   Light touch normal.     Gait, Coordination, and Reflexes Unsteady and abnormal gait on left side  Using a cane       Resp 18       CT Results (maximum last 3):  CT Result (most recent):  CTA HEAD NECK W CONTRAST 11/09/2022    Narrative  EXAM: CTA HEAD NECK W CONTRAST    CLINICAL HISTORY: severe HA, h/o SAH.  compare to prior CTA  INDICATION: severe HA, h/o SAH.  compare to prior CTA    COMPARISON: 11/01/2022  CONTRAST: 100 ml [id number]    TECHNIQUE:  Unenhanced scout images were obtained to localize the volume for  acquisition. Multi slice helical axial CT angiography was performed from the  aortic arch to the top of the head during uneventful rapid bolus intravenous  contrast administration.   Coronal and sagittal reformations and 3D post  processing was performed.  CT dose reduction was achieved through use of a  standardized protocol tailored for this examination and automatic exposure  control for dose modulation.  Examination was evaluated utilizing the viz. AI algorithm.    FINDINGS:    CTA NECK  There is conventional three vessel arch anatomy. The bilateral subclavian,  common carotid, and internal carotid arteries are patent with no flow-limiting  stenosis.  % of right carotid artery stenosis: 0  % of left carotid artery stenosis: 0  Measurements utilizing NASCET criteria.  NASCET method was utilized for calculating stenosis.    The vertebral arteries are codominant and patent. The cervical soft tissues are  unremarkable. There are degenerative changes of the cervical spine.    CTA HEAD  There are A1 segments bilaterally. Azygos A2 segment. Petrous and cavernous  carotid  arteries are patent.M1 segments are patent. Symmetric arborization  of M2  vessels is demonstrated. The basilar artery is patent. The proximal P1 segments  are patent bilaterally.There is no aneurysm. There are no sizable posterior  communicating arteries.  Scattered foci of cortical/linear/serpiginous hyperenhancement are related to  recent infarcts at the right frontal lobe, left frontal lobe, and right and left  parietal lobes.    Impression  There is no major vessel occlusion.      Atypical pattern of postcontrast enhancement is related to numerous small  scattered infarcts as demonstrated on brain MRI performed on 05/22/2022.  There is no acute intracranial process.  There is no aneurysm, dissection or hemodynamically significant stenosis.        Electronically signed by Zenaida Niece HABIB      CT HEAD WO CONTRAST 11/09/2022    Narrative  EXAM: CT HEAD WO CONTRAST    INDICATION: history of SAH, ongoing HA    COMPARISON: 11/05/2022.    CONTRAST: None.    TECHNIQUE: Unenhanced CT of the head was performed using 5 mm images. Brain and  bone windows were generated. Coronal and sagittal reformats. CT dose reduction  was achieved through use of a standardized protocol tailored for this  examination and automatic exposure control for dose modulation.    FINDINGS:  The ventricles and sulci are normal in size, shape and configuration. There is  no significant white matter disease. The previously described high density  collection in the interpeduncular fossa is no longer well visualized. The focal  low densities in the frontal lobe parenchyma previously described are less well  visualized. There is no new parenchymal mass or hemorrhage. No new extra-axial  fluid or blood collection.. The basilar cisterns are open. No CT evidence of  acute infarct.    The bone windows demonstrate no abnormalities. The visualized portions of the  paranasal sinuses and mastoid air cells are clear.    Impression  1. No new acute intracranial  abnormality.  2. Basilar cistern hemorrhage previously described is now difficult to  visualize.  3. Persistent but less prominent hypodensities in the frontal lobes bilaterally  consistent with suspected embolic ischemia shown on MRI.    Electronically signed by Creed Copper      CT HEAD WO CONTRAST 11/05/2022    Narrative  EXAM: CT HEAD WO CONTRAST    INDICATION: patient hit head on side rail, severe headache    COMPARISON: 11/03/2022.    CONTRAST: None.    TECHNIQUE: Unenhanced CT of the head was performed using 5 mm images. Brain and  bone windows were generated. Coronal and sagittal reformats. CT dose reduction  was achieved through use of a standardized protocol tailored for this  examination and automatic exposure control for dose modulation.    FINDINGS:  The ventricles and sulci are normal in size, shape and configuration.  Hypodensity in the right frontal lobe unchanged. Evidence in the left frontal  lobe unchanged.. There is no intracranial hemorrhage, extra-axial collection, or  mass effect. The basilar cisterns are open. No CT evidence of acute infarct.    The bone windows demonstrate no abnormalities. The visualized portions of the  paranasal sinuses and mastoid air cells are clear.    Impression  No acute intracranial abnormality        Electronically signed by Lubertha South        MRI Results (maximum last 3):  MRI Result (most recent):  MRI BRAIN W WO CONTRAST 11/05/2022    Narrative  EXAM:  MRI BRAIN W WO CONTRAST  INDICATION:    hypodensity on CT Head (R Frontal Lobe)    COMPARISON: CT head 11/05/2022, MRI brain 04/23/2022.    CONTRAST: 15 ml ProHance.    TECHNIQUE:  Multiplanar multisequence acquisition without and with IV contrast of the brain.    FINDINGS:  Numerous small scattered acute supratentorial and infratentorial infarcts  throughout the cerebral hemispheres involving all lobes, largest area in the  right frontal lobe, as well as within the bilateral corona radiata, left  basal  ganglia and thalamus, and bilateral cerebellum (right larger than left).    Stable small subacute subarachnoid hemorrhage within the right interpeduncular  fossa and right ambient cistern.    The ventricles are normal in size and position. There is no mass effect. There  is no cerebellar tonsillar herniation. Expected arterial flow-voids are present.  No evidence of abnormal enhancement.    The paranasal sinuses, mastoid air cells, and middle ears are clear. The orbital  contents are within normal limits. No significant osseous or scalp lesions are  identified.    Impression  1. Numerous small scattered acute supratentorial and infratentorial infarcts as  above, largest in the right frontal lobe and right cerebellum, most consistent  with central embolic etiology.  2. Stable small subacute subarachnoid hemorrhage in the right basilar cisterns.      Electronically signed by Salley Scarlet      MRI BRAIN WO CONTRAST 04/23/2022    Narrative  INDICATION:   Cervicalgia; Radiculopathy, cervical region; Polyneuropathy,  unspecified; Cervical disc disorder with myelopathy, cervicothoracic region    EXAMINATION:  MRI BRAIN WO CONTRAST    COMPARISON: November 08, 2017    TECHNIQUE:  Multiplanar multisequence acquisition without contrast of the brain.      FINDINGS:    Ventricles:  Midline, no hydrocephalus.  Brain Parenchyma/Brainstem:  Normal for age. No acute infarction.  Intracranial Hemorrhage:  None.  Basal Cisterns:  Normal.  Flow Voids:  Normal.  Additional Comments: Partly visualized degenerative changes in the cervical  spine, see separate cervical spine MRI report from the same day.    Impression  No significant abnormality or acute process.      MRI LUMBAR SPINE W WO CONTRAST 03/31/2022    Narrative  EXAM: MRI LUMBAR SPINE W WO CONTRAST    INDICATION: back pain and incontinence.    COMPARISON: CT 11/17/2020    TECHNIQUE: MR imaging of the lumbar spine was performed using the following  sequences: sagittal T1,  T2, STIR;  axial T1, T2 prior to and following contrast  administration.    CONTRAST: mL of ProHance.    FINDINGS:    There is normal alignment of the lumbar spine. Vertebral body heights are  maintained. Edema is partially seen in the bilateral sacral ala.    The conus medullaris terminates at . Signal and caliber of the distal spinal  cord are within normal limits. There is no pathologic intrathecal enhancement.    The paraspinal soft tissues are within normal limits.    Lower thoracic spine: No herniation or stenosis.    L1-L2: No herniation or stenosis.    L2-L3: No herniation or stenosis.    L3-L4: No herniation or stenosis.    L4-L5: No herniation or stenosis.    L5-S1: No herniation or stenosis.    Impression  1. Unremarkable lumbar spine.  2. Edema is partially seen in the bilateral sacral ala. This is likely related  to the previously seen sclerosis surrounding the sacroiliac joints. 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            Assessment and Plan   Christina Stevens was seen today for follow-up from hospital.    Diagnoses and all orders for this visit:    History of stroke involving cerebellum  -     External Referral To Occupational Therapy  -     External Referral To Speech Therapy    History of subarachnoid hemorrhage  -     External Referral To Occupational Therapy  -     External Referral To Speech Therapy    Long-term use of aspirin therapy    Hospital discharge follow-up    Left-sided weakness  -     External Referral To Occupational Therapy    Word finding difficulty  -     External Referral To Speech Therapy      42 year old with history of multiple strokes and a SAH. She is reporting that she is doing well. We went over her entire hospital stay and I reviewed her imaging with her and answered all her questions. So far cardiac work-up was negative. Biggest concern today was her going back to work. I was  explaining that her stroke was a significant size affecting different areas and its going to take time. I can not put a time fram on when she will feel ready to go back to work, but I can make a recommendation that she should heal for at least 6 weeks and at least get PT started before she goes back. Im worried she is going to push herself and make her s/s worse. She understands. I would like to her to get a OT/SLP eval as well given her reported memory concerns and L arm weakness. Her gait is improving but does need PT to get better. We talked about different things for her to do at home. I will fill out what I can for her paperwork and ultimately her PCP will be the one to follow up throughout this process as we will likely sign off after her next FU. She understands. Her exam today does not show anything concerning. She will stay on ASA daily for stroke prevention. [place] education and ED precautions were discussed. I will see her back in 6 months for  FU      I spent 45 minutes of time today reviewing the medical record and notes, imaging, examining the patient, and face-to-face time, patient/family teaching and medication side effects, and time spent completing documentation.      Walden Field, APRN - NP

## 2023-01-18 NOTE — Telephone Encounter (Signed)
 Call placed to patient.  2 identifiers received.  Advised patient that NP Valentino Hue is unable to fill out the Capacity questionnaire form on her Disability paper work.  Advised patient to follow up with PCP.  Patient indicated understanding.

## 2023-05-05 NOTE — Telephone Encounter (Signed)
 Call placed to patient.  LVM on identified line requesting return call

## 2023-05-05 NOTE — Telephone Encounter (Signed)
 Christina Stevens is calling stating that the patient's headaches are getting worse. She is having more weakness and she is more off balance. She states her hair is falling again and she is sleeping most of the day again.

## 2023-05-05 NOTE — Telephone Encounter (Signed)
 Call placed to [facility].  Advised that I was unable to discuss patient with her as she is not on the HIPAA form.

## 2023-05-11 ENCOUNTER — Inpatient Hospital Stay
Admit: 2023-05-11 | Discharge: 2023-05-11 | Disposition: A | Discharge status: Home / Self Care | Payer: PRIVATE HEALTH INSURANCE | Attending: Student in an Organized Health Care Education/Training Program

## 2023-05-11 ENCOUNTER — Emergency Department: Admit: 2023-05-11 | Payer: PRIVATE HEALTH INSURANCE | Primary: Primary Care

## 2023-05-11 DIAGNOSIS — S39012A Strain of muscle, fascia and tendon of lower back, initial encounter: Secondary | ICD-10-CM

## 2023-05-11 LAB — COMPREHENSIVE METABOLIC PANEL
ALT: 16 U/L (ref 12–78)
AST: 16 U/L (ref 15–37)
Albumin/Globulin Ratio: 0.9 — ABNORMAL LOW (ref 1.1–2.2)
Albumin: 3.2 g/dL — ABNORMAL LOW (ref 3.5–5.0)
Alk Phosphatase: 69 U/L (ref 45–117)
Anion Gap: 4 mmol/L (ref 2–12)
BUN/Creatinine Ratio: 9 — ABNORMAL LOW (ref 12–20)
BUN: 8 mg/dL (ref 6–20)
CO2: 27 mmol/L (ref 21–32)
Calcium: 8.7 mg/dL (ref 8.5–10.1)
Chloride: 108 mmol/L (ref 97–108)
Creatinine: 0.88 mg/dL (ref 0.55–1.02)
Est, Glom Filt Rate: 84 mL/min/{1.73_m2} (ref >60)
Globulin: 3.4 g/dL (ref 2.0–4.0)
Glucose: 104 mg/dL — ABNORMAL HIGH (ref 65–100)
Potassium: 3.6 mmol/L (ref 3.5–5.1)
Sodium: 139 mmol/L (ref 136–145)
Total Bilirubin: 0.2 mg/dL (ref 0.2–1.0)
Total Protein: 6.6 g/dL (ref 6.4–8.2)

## 2023-05-11 LAB — CBC WITH AUTO DIFFERENTIAL
Basophils %: 0.6 % (ref 0.0–1.0)
Basophils Absolute: 0.06 10*3/uL (ref 0.00–0.10)
Eosinophils %: 6.3 % (ref 0.0–7.0)
Eosinophils Absolute: 0.65 10*3/uL — ABNORMAL HIGH (ref 0.00–0.40)
Hematocrit: 39.7 % (ref 35.0–47.0)
Hemoglobin: 13.5 g/dL (ref 11.5–16.0)
Immature Granulocytes %: 0.3 % (ref 0.0–0.5)
Immature Granulocytes Absolute: 0.03 10*3/uL (ref 0.00–0.04)
Lymphocytes %: 39.7 % (ref 12.0–49.0)
Lymphocytes Absolute: 4.09 10*3/uL — ABNORMAL HIGH (ref 0.80–3.50)
MCH: 31.5 pg (ref 26.0–34.0)
MCHC: 34 g/dL (ref 30.0–36.5)
MCV: 92.5 FL (ref 80.0–99.0)
MPV: 9.8 FL (ref 8.9–12.9)
Monocytes %: 6.3 % (ref 5.0–13.0)
Monocytes Absolute: 0.65 10*3/uL (ref 0.00–1.00)
Neutrophils %: 46.8 % (ref 32.0–75.0)
Neutrophils Absolute: 4.82 10*3/uL (ref 1.80–8.00)
Nucleated RBCs: 0 /100{WBCs}
Platelets: 331 10*3/uL (ref 150–400)
RBC: 4.29 M/uL (ref 3.80–5.20)
RDW: 11.9 % (ref 11.5–14.5)
WBC: 10.3 10*3/uL (ref 3.6–11.0)
nRBC: 0 10*3/uL (ref 0.00–0.01)

## 2023-05-11 LAB — POCT GLUCOSE: POC Glucose: 101 mg/dL (ref 65–117)

## 2023-05-11 LAB — EKG 12-LEAD
Atrial Rate: 73 {beats}/min
Diagnosis: NORMAL
P Axis: 59 degrees
P-R Interval: 134 ms
Q-T Interval: 394 ms
QRS Duration: 82 ms
QTc Calculation (Bazett): 434 ms
R Axis: 75 degrees
T Axis: 78 degrees
Ventricular Rate: 73 {beats}/min

## 2023-05-11 LAB — MAGNESIUM: Magnesium: 2 mg/dL (ref 1.6–2.4)

## 2023-05-11 LAB — CK: Total CK: 110 U/L (ref 26–192)

## 2023-05-11 LAB — TROPONIN: Troponin, High Sensitivity: 4 ng/L (ref 0–51)

## 2023-05-11 MED ORDER — KETOROLAC TROMETHAMINE 30 MG/ML IJ SOLN
30 | INTRAMUSCULAR | Status: AC
Start: 2023-05-11 — End: 2023-05-11
  Administered 2023-05-11: 10:00:00 30 mg via INTRAVENOUS

## 2023-05-11 MED ORDER — METHOCARBAMOL 750 MG PO TABS
750 | ORAL_TABLET | Freq: Four times a day (QID) | ORAL | 0 refills | Status: AC
Start: 2023-05-11 — End: 2023-05-21
  Filled 2023-05-11: qty 40, 10d supply, fill #0

## 2023-05-11 MED ORDER — ONDANSETRON HCL 4 MG/2ML IJ SOLN
4 | Freq: Once | INTRAMUSCULAR | Status: AC
Start: 2023-05-11 — End: 2023-05-11
  Administered 2023-05-11: 09:00:00 4 mg via INTRAVENOUS

## 2023-05-11 MED ORDER — IOPAMIDOL 76 % IV SOLN
76 | Freq: Once | INTRAVENOUS | Status: AC | PRN
Start: 2023-05-11 — End: 2023-05-11
  Administered 2023-05-11: 10:00:00 100 mL via INTRAVENOUS

## 2023-05-11 MED ORDER — LIDOCAINE 5 % EX PTCH
5 | MEDICATED_PATCH | Freq: Every day | CUTANEOUS | 0 refills | Status: AC
Start: 2023-05-11 — End: 2023-05-21

## 2023-05-11 MED ORDER — OXYCODONE HCL 5 MG PO TABS
5 | ORAL_TABLET | Freq: Four times a day (QID) | ORAL | 0 refills | Status: AC | PRN
Start: 2023-05-11 — End: 2023-05-14
  Filled 2023-05-11: qty 12, 3d supply, fill #0

## 2023-05-11 MED ORDER — HYDROMORPHONE HCL PF 1 MG/ML IJ SOLN
1 | INTRAMUSCULAR | Status: AC
Start: 2023-05-11 — End: 2023-05-11
  Administered 2023-05-11: 08:00:00 1 mg via INTRAVENOUS

## 2023-05-11 MED ORDER — HYDROMORPHONE HCL PF 1 MG/ML IJ SOLN
1 | INTRAMUSCULAR | Status: DC
Start: 2023-05-11 — End: 2023-05-11

## 2023-05-11 MED ORDER — IBUPROFEN 600 MG PO TABS
600 | ORAL_TABLET | Freq: Three times a day (TID) | ORAL | 0 refills | Status: AC | PRN
Start: 2023-05-11 — End: ?
  Filled 2023-05-11: qty 30, 10d supply, fill #0

## 2023-05-11 MED ORDER — DIAZEPAM 5 MG PO TABS
5 | Freq: Once | ORAL | Status: AC
Start: 2023-05-11 — End: 2023-05-11
  Administered 2023-05-11: 10:00:00 5 mg via ORAL

## 2023-05-11 MED FILL — DIAZEPAM 5 MG PO TABS: 5 MG | ORAL | Qty: 1

## 2023-05-11 MED FILL — ISOVUE-370 76 % IV SOLN: 76 % | INTRAVENOUS | Qty: 100

## 2023-05-11 MED FILL — HYDROMORPHONE HCL 1 MG/ML IJ SOLN: 1 MG/ML | INTRAMUSCULAR | Qty: 1

## 2023-05-11 MED FILL — ONDANSETRON HCL 4 MG/2ML IJ SOLN: 4 MG/2ML | INTRAMUSCULAR | Qty: 2

## 2023-05-11 MED FILL — KETOROLAC TROMETHAMINE 30 MG/ML IJ SOLN: 30 MG/ML | INTRAMUSCULAR | Qty: 1

## 2023-05-11 NOTE — ED Provider Notes (Signed)
 [facility]  EMERGENCY DEPARTMENT ENCOUNTER    Patient Name: Christina Stevens  MRN: [id number]  Birth Date: [birth year]  Provider: Claven Cumming, MD  PCP: Renny Casey, APRN - NP  Time/Date of evaluation:  05/11/23    History of Presenting Illness     Chief Complaint   Patient presents with    Shortness of Breath     Pt wheeled to room c/o of SOB, bilateral flank pain to abd pain, Chest pain, N/V/D.   HX of asthma pt reports working in the yard over the weekend and began having lower back pain and SOB began Monday and progressively  getting worse.     Flank Pain       HISTORY (Narrative):   Christina Stevens is a 43 y.o. female presents to the Emergency Department with shortness of breath and back pain. The shortness of breath started Sunday night after two days of work, initially without back pain. The lower back pain began later, radiating to the front. The patient reports worsening shortness of breath in the last 2 hours, states that the pain has been making her catch her breath, stating "something's stopping it." The patient has a history of passing out when pain becomes severe and has experienced increased anxiety, especially in hospital settings, since a stroke 6 months ago.    Current and Past Medications and Supplements  - Not specified in the provided text    Social History  - Living situation: Not specified  - Medical Conditions: Autism    Review of Systems  - Respiratory: Positive for shortness of breath  - Musculoskeletal: Positive for lower back pain radiating to the front  - Psychiatric: Positive for anxiety, especially in medical settings since the stroke    Medical History  - Autism  - Asthma  - Cerebrovascular accident (CVA) with cerebral bleed 6 months ago  - Loop recorder implantation (date not specified)  - History of syncope associated with severe pain    Nursing Notes were all reviewed and agreed with or any disagreements were addressed in the HPI.    Past History      PAST MEDICAL HISTORY:  Past Medical History:   Diagnosis Date    ADHD     Brigido Canales infection     MRSA (methicillin resistant staph aureus) culture positive     MS (congenital mitral stenosis)     PCOS (polycystic ovarian syndrome)        PAST SURGICAL HISTORY:  Past Surgical History:   Procedure Laterality Date    APPENDECTOMY      COLONOSCOPY N/A 03/17/2022    COLONOSCOPY W/ ENDOSCOPIC MUCOSAL RESECTION performed by Roetta Clarke, MD at [facility] ENDOSCOPY    IR CHOLECYSTOSTOMY PERCUTANEOUS COMPLETE         FAMILY HISTORY:  No family history on file.    SOCIAL HISTORY:  Social History     Tobacco Use    Smoking status: Every Day    Smokeless tobacco: Never   Substance Use Topics    Alcohol use: No    Drug use: Yes     Types: Marijuana Karron Pagan)       MEDICATIONS:  No current facility-administered medications on file prior to encounter.     Current Outpatient Medications on File Prior to Encounter   Medication Sig Dispense Refill    niMODipine (NIMOTOP) 30 MG capsule Take 2 capsules by mouth every 4 hours for 15 days 64 capsule 3  aspirin 81 MG chewable tablet Take 1 tablet by mouth daily      Multiple Vitamin (MULTIVITAMIN) TABS tablet Take 1 tablet by mouth daily      methocarbamol (ROBAXIN) 500 MG tablet Take 3 tablets by mouth 2 times daily as needed (headache)      pantoprazole (PROTONIX) 20 MG tablet Take 1 tablet by mouth daily      acetaminophen (TYLENOL) 325 MG tablet Take 2 tablets by mouth every 4 hours as needed for Pain      amphetamine-dextroamphetamine (ADDERALL) 20 MG tablet Take 1 tablet by mouth 2 times daily. (Patient not taking: Reported on 11/30/2022)      atorvastatin (LIPITOR) 40 MG tablet Take 1 tablet by mouth daily         ALLERGIES:  Allergies   Allergen Reactions    Decadron [Dexamethasone] Swelling and Anxiety    Adhesive Tape Rash    Chlorhexidine Itching    Oxycodone Swelling    Oxycodone-Acetaminophen Itching       SOCIAL DETERMINANTS OF HEALTH:  Social Drivers of Health      Tobacco Use: High Risk (05/11/2023)    Patient History     Smoking Tobacco Use: Every Day     Smokeless Tobacco Use: Never     Passive Exposure: Not on file   Alcohol Use: Not At Risk (05/11/2023)    AUDIT-C     Frequency of Alcohol Consumption: Never     Average Number of Drinks: Patient does not drink     Frequency of Binge Drinking: Never   Financial Resource Strain: Not on file   Food Insecurity: Not on file   Transportation Needs: Not on file   Physical Activity: Not on file   Stress: Not on file   Social Connections: Not on file   Intimate Partner Violence: Not on file   Depression: Not at risk (09/27/2022)    Received from [facility]    PHQ-2     Patient Health Questionnaire-2 Score: 0   Housing Stability: Not on file   Interpersonal Safety: Not At Risk (05/11/2023)    Interpersonal Safety Domain Source: IP Abuse Screening     Physical abuse: Denies     Verbal abuse: Denies     Emotional abuse: Denies     Financial abuse: Denies     Sexual abuse: Denies   Utilities: Not on file       Review of Systems     Negative except as listed above in HPI.    Physical Exam     Vitals:    05/11/23 0445 05/11/23 0500 05/11/23 0539 05/11/23 0615   BP: (!) 144/98 (!) 148/81 132/71 138/81   Pulse: 63 59 64 52   Resp: 23 12 18 15    Temp:       TempSrc:       SpO2: 99% 99% 97% 97%       Physical Exam  Vitals and nursing note reviewed.   Constitutional:       General: She is in acute distress.      Appearance: Normal appearance.   HENT:      Head: Normocephalic and atraumatic.      Right Ear: External ear normal.      Left Ear: External ear normal.      Nose: Nose normal.      Mouth/Throat:      Mouth: Mucous membranes are moist.   Eyes:      Conjunctiva/sclera: Conjunctivae normal.  Pupils: Pupils are equal, round, and reactive to light.   Cardiovascular:      Rate and Rhythm: Normal rate and regular rhythm.   Pulmonary:      Effort: Pulmonary effort is normal. No respiratory distress.      Breath sounds: Normal breath sounds.  No decreased breath sounds, wheezing or rhonchi.   Abdominal:      General: Abdomen is flat. There is no distension.      Palpations: Abdomen is soft.      Tenderness: There is no abdominal tenderness. There is no guarding.   Musculoskeletal:         General: No swelling.      Cervical back: Normal range of motion and neck supple.      Comments: L-spine paraspinal muscle tenderness, no midline C, T, or L-spine tenderness.   Skin:     General: Skin is warm.      Capillary Refill: Capillary refill takes less than 2 seconds.   Neurological:      General: No focal deficit present.      Mental Status: She is alert and oriented to person, place, and time. Mental status is at baseline.       Diagnostic Study Results     LABS:  Results for orders placed or performed during the hospital encounter of 05/11/23   CBC with Auto Differential   Result Value Ref Range    WBC 10.3 3.6 - 11.0 K/uL    RBC 4.29 3.80 - 5.20 M/uL    Hemoglobin 13.5 11.5 - 16.0 g/dL    Hematocrit 40.9 81.1 - 47.0 %    MCV 92.5 80.0 - 99.0 FL    MCH 31.5 26.0 - 34.0 PG    MCHC 34.0 30.0 - 36.5 g/dL    RDW 91.4 78.2 - 95.6 %    Platelets 331 150 - 400 K/uL    MPV 9.8 8.9 - 12.9 FL    Nucleated RBCs 0.0 0 PER 100 WBC    nRBC 0.00 0.00 - 0.01 K/uL    Neutrophils % 46.8 32.0 - 75.0 %    Lymphocytes % 39.7 12.0 - 49.0 %    Monocytes % 6.3 5.0 - 13.0 %    Eosinophils % 6.3 0.0 - 7.0 %    Basophils % 0.6 0.0 - 1.0 %    Immature Granulocytes % 0.3 0.0 - 0.5 %    Neutrophils Absolute 4.82 1.80 - 8.00 K/UL    Lymphocytes Absolute 4.09 (H) 0.80 - 3.50 K/UL    Monocytes Absolute 0.65 0.00 - 1.00 K/UL    Eosinophils Absolute 0.65 (H) 0.00 - 0.40 K/UL    Basophils Absolute 0.06 0.00 - 0.10 K/UL    Immature Granulocytes Absolute 0.03 0.00 - 0.04 K/UL    Differential Type AUTOMATED     Comprehensive Metabolic Panel   Result Value Ref Range    Sodium 139 136 - 145 mmol/L    Potassium 3.6 3.5 - 5.1 mmol/L    Chloride 108 97 - 108 mmol/L    CO2 27 21 - 32 mmol/L    Anion Gap  4 2 - 12 mmol/L    Glucose 104 (H) 65 - 100 mg/dL    BUN 8 6 - 20 MG/DL    Creatinine 2.13 0.86 - 1.02 MG/DL    BUN/Creatinine Ratio 9 (L) 12 - 20      Est, Glom Filt Rate 84 >60 ml/min/1.55m2    Calcium 8.7 8.5 - 10.1  MG/DL    Total Bilirubin 0.2 0.2 - 1.0 MG/DL    ALT 16 12 - 78 U/L    AST 16 15 - 37 U/L    Alk Phosphatase 69 45 - 117 U/L    Total Protein 6.6 6.4 - 8.2 g/dL    Albumin 3.2 (L) 3.5 - 5.0 g/dL    Globulin 3.4 2.0 - 4.0 g/dL    Albumin/Globulin Ratio 0.9 (L) 1.1 - 2.2     Magnesium   Result Value Ref Range    Magnesium 2.0 1.6 - 2.4 mg/dL   Troponin   Result Value Ref Range    Troponin, High Sensitivity 4 0 - 51 ng/L   CK   Result Value Ref Range    Total CK 110 26 - 192 U/L   POCT Glucose   Result Value Ref Range    POC Glucose 101 65 - 117 mg/dL    Performed by: Pricilla Handler EDT    EKG 12 Lead   Result Value Ref Range    Ventricular Rate 73 BPM    Atrial Rate 73 BPM    P-R Interval 134 ms    QRS Duration 82 ms    Q-T Interval 394 ms    QTc Calculation (Bazett) 434 ms    P Axis 59 degrees    R Axis 75 degrees    T Axis 78 degrees    Diagnosis       Normal sinus rhythm  Normal ECG  When compared with ECG of 01-Nov-2022 13:54,  No significant change was found  Confirmed by Jacqulyn Bath, MD, Earna Coder ([id number]) on 05/11/2023 5:38:53 AM         RADIOLOGIC STUDIES:   Non x-ray images such as CT, Ultrasound and MRI are read by the radiologist. X-ray images are visualized and preliminarily interpreted by the ED Provider with the findings as listed in the ED Course section below.     Interpretation per the Radiologist is listed below, if available at the time of this note:    CT ABDOMEN PELVIS W IV CONTRAST Additional Contrast? None   Final Result   No acute process.      Electronically signed by Liz Malady      CT Head W/O Contrast   Final Result      No acute process.      Electronically signed by Liz Malady      XR CHEST 1 VIEW   Final Result   No acute process.      Electronically signed by Liz Malady             ED Course and Differential Diagnosis/MDM     5:05 AM EDT DDx, ED Course, and Reassessment:    Vitals: No data found.      ED COURSE/Records Reviewed with summary (prior medical records and Nursing notes)  Nursing Notes, Old Medical Records, Previous EKGs, Previous Radiology Studies, and Previous Laboratory Studies    ED Course as of 05/13/23 0105   Wed May 11, 2023   0520 CT Head W/O Contrast [ZD]      ED Course User Index  [ZD] TRUE Garciamartinez, Juan Quam, MD       Patient was given the following medications:    Medications   HYDROmorphone HCl PF (DILAUDID) injection 1 mg (1 mg IntraVENous Given 05/11/23 0428)   ondansetron (ZOFRAN) injection 4 mg (4 mg IntraVENous Given 05/11/23 0432)   iopamidol (ISOVUE-370) 76 % injection 100 mL (100 mLs IntraVENous  Given 05/11/23 0549)   ketorolac (TORADOL) injection 30 mg (30 mg IntraVENous Given 05/11/23 0539)   diazePAM (VALIUM) tablet 5 mg (5 mg Oral Given 05/11/23 0538)     CONSULTS:    None    Social Determinants affecting Diagnosis/Treatment: None    DISCUSSION:  Patient is a 43 year old female presenting to the ED with her wife stating that she is having excruciating lower back pain after doing yard work for the last 3 days taking down large trees.  Patient notes this is the first physically insertive activity she has done since being discharged after her recent stroke evaluation inpatient.  Patient denies any falls or trauma.  Vital signs stable, afebrile, normal heart rate, normotensive.  Physical exam notable for paraspinal L-spine tenderness no midline, C, T, or L-spine tenderness.  Patient has 5 out of 5 strength in bilateral lower extremity sensation intact in bilateral lower extremities no deficits.  Chest is clear on auscultation.  Suspect that shortness of breath is likely secondary to pain with muscle spasms after heavy amounts of exertional activity over the last 3 days with poor fluid intake.  Will evaluate for rhabdomyolysis.  Will get CBC, CMP, CK, EKG, troponin, CT  head given that patient has had a subarachnoid previously with atypical symptoms, chest x-ray, troponin though low suspicion for ACS at this time, CT abdomen pelvis to evaluate for intra-abdominal pathology as pain wraps around to the lower abdomen but suspect musculoskeletal strain versus lumbar strain.  Patient has no red flag symptoms no urinary retention no numbness or tingling no difficulty ambulating.  Low concern for cord compression syndrome such as cauda equina.  If negative will give patient outpatient follow-up as well as discussed the importance of physical therapy will give short dose of narcotics to assist in pain management at home.    ADDITIONAL CONSIDERATIONS:  None  Procedures/Critical Care     Procedures    ED FINAL IMPRESSION     1. Lumbar strain, initial encounter        DISPOSITION/PLAN     DISPOSITION Decision To Discharge 05/11/2023 06:46:13 AM   DISPOSITION CONDITION Stable        Discharge Note: The patient is stable for discharge home. The signs, symptoms, diagnosis, and discharge instructions have been discussed, understanding conveyed, and agreed upon. The patient is to follow up as recommended or return to ER should their symptoms worsen.     PATIENT REFERRED TO:    Renny Casey, APRN - NP  [address]  [place] [id number]  [phone]    In 1 week      [facility] Emergency Department  [address]  [place]  [address]  [phone]    If symptoms worsen      DISCHARGE MEDICATIONS:       Medication List        START taking these medications      ibuprofen 600 MG tablet  Commonly known as: ADVIL;MOTRIN  Take 1 tablet by mouth 3 times daily as needed for Pain     lidocaine 5 %  Commonly known as: LIDODERM  Place 1 patch onto the skin daily for 10 days 12 hours on, 12 hours off.     oxyCODONE 5 MG immediate release tablet  Commonly known as: Roxicodone  Take 1 tablet by mouth every 6 hours as needed for Pain for up to 3 days. Intended supply: 3 days. Take  lowest dose possible to manage pain Max Daily Amount:  20 mg            CHANGE how you take these medications      * methocarbamol 500 MG tablet  Commonly known as: ROBAXIN  What changed: Another medication with the same name was added. Make sure you understand how and when to take each.     * methocarbamol 750 MG tablet  Commonly known as: Robaxin-750  Take 1 tablet by mouth 4 times daily for 10 days  What changed: You were already taking a medication with the same name, and this prescription was added. Make sure you understand how and when to take each.           * This list has 2 medication(s) that are the same as other medications prescribed for you. Read the directions carefully, and ask your doctor or other care provider to review them with you.                ASK your doctor about these medications      acetaminophen 325 MG tablet  Commonly known as: TYLENOL     amphetamine-dextroamphetamine 20 MG tablet  Commonly known as: ADDERALL     aspirin 81 MG chewable tablet     atorvastatin 40 MG tablet  Commonly known as: LIPITOR     multivitamin Tabs tablet     niMODipine 30 MG capsule  Commonly known as: NIMOTOP  Take 2 capsules by mouth every 4 hours for 15 days     pantoprazole 20 MG tablet  Commonly known as: PROTONIX               Where to Get Your Medications        These medications were sent to [facility] - [place], [place] - [address] - [place] [phone] Carmon Ginsberg [phone]  [address], [place] [address]      Phone: [phone]   ibuprofen 600 MG tablet  lidocaine 5 %  methocarbamol 750 MG tablet  oxyCODONE 5 MG immediate release tablet         DISCONTINUED MEDICATIONS:    Discharge Medication List as of 05/11/2023  7:02 AM          (Please note that parts of this dictation were completed with voice recognition software. Quite often unanticipated grammatical, syntax, homophones, and other interpretive errors are inadvertently transcribed by the computer software.  Please disregards these errors. Please excuse any errors that have escaped final proofreading.)    I am the Primary Clinician of Record.   Karrie Doffing, MD           Gari Trovato, Juan Quam, MD  05/13/23 [phone]

## 2023-05-11 NOTE — ED Notes (Signed)
 Patient in CT

## 2023-05-11 NOTE — ED Notes (Addendum)
 Notified by Gregary Signs EDT that patient had lost consciousness. Responded to bedside; patient turned onto R side by visitor, strained movements observed with labored, shallow breathing. No noted response to nailbed pressure or verbal stimuli. Patient's friend notes that she has lost consciousness before as a response to intense pain, but that this occurrence is more extreme than previous events. Patient placed on 1L by nasal cannula for comfort. Deuell MD notified by Gregary Signs EDT of patient's condition. Patient's pulse and circulation appropriate at this time. Patient's respiratory rate improved, respirations remain shallow. Patient became responsive to voice and endorses significant pain to lower back and bilateral flanks, stating that it now radiates to her abdomen. Blood glucose measured by Gregary Signs EDT, BG 101 at this time.    Deuell MD responded to bedside to assess patient. Patient speaks in fragmented statements, endorsing worsening pain. Orders placed by MD. Confirmed timeline of symptoms with patient's visitor. Patient began experiencing back pain on Sunday after working outside over the weekend, and pain began to radiate to bilateral flanks. Pain worsened in the evening (4/8), without relief from muscle relaxers and OTC medications. Shortness of breath started this morning without improvement, causing distress noted on arrival.

## 2023-05-11 NOTE — ED Notes (Signed)
 Discharge instructions have been discussed with patient. Opportunity for questions and further education has been provided to patient. Patient expressed understanding. Patient  understand to seek Emergency Medical attention for chest pain or new or worsening symptoms.

## 2023-05-11 NOTE — Discharge Instructions (Signed)
 Thank You!    It was a pleasure taking care of you in our Emergency Department today. We know that when you come to our Emergency Department, you are entrusting [place] with your health, comfort, and safety. Our physicians and nurses honor that trust, and truly appreciate the opportunity to care for you and your loved ones.      We also value your feedback. If you receive a survey about your Emergency Department experience today, please fill it out.  We care about our patients' feedback, and we listen to what you have to say.  Thank you.    Laural Benes, MD  ________________________________________________________________________  I have included a copy of your lab results and/or radiologic studies from today's visit so you can have them easily available at your follow-up visit. We hope you feel better and please do not hesitate to contact the ED if you have any questions at all!    Recent Results (from the past 12 hours)   EKG 12 Lead    Collection Time: 05/11/23  3:57 AM   Result Value Ref Range    Ventricular Rate 73 BPM    Atrial Rate 73 BPM    P-R Interval 134 ms    QRS Duration 82 ms    Q-T Interval 394 ms    QTc Calculation (Bazett) 434 ms    P Axis 59 degrees    R Axis 75 degrees    T Axis 78 degrees    Diagnosis       Normal sinus rhythm  Normal ECG  When compared with ECG of 01-Nov-2022 13:54,  No significant change was found  Confirmed by Jacqulyn Bath, MD, Earna Coder ([id number]) on 05/11/2023 5:38:53 AM     CBC with Auto Differential    Collection Time: 05/11/23  4:11 AM   Result Value Ref Range    WBC 10.3 3.6 - 11.0 K/uL    RBC 4.29 3.80 - 5.20 M/uL    Hemoglobin 13.5 11.5 - 16.0 g/dL    Hematocrit 29.5 62.1 - 47.0 %    MCV 92.5 80.0 - 99.0 FL    MCH 31.5 26.0 - 34.0 PG    MCHC 34.0 30.0 - 36.5 g/dL    RDW 30.8 65.7 - 84.6 %    Platelets 331 150 - 400 K/uL    MPV 9.8 8.9 - 12.9 FL    Nucleated RBCs 0.0 0 PER 100 WBC    nRBC 0.00 0.00 - 0.01 K/uL    Neutrophils % 46.8 32.0 - 75.0 %    Lymphocytes % 39.7 12.0 - 49.0 %     Monocytes % 6.3 5.0 - 13.0 %    Eosinophils % 6.3 0.0 - 7.0 %    Basophils % 0.6 0.0 - 1.0 %    Immature Granulocytes % 0.3 0.0 - 0.5 %    Neutrophils Absolute 4.82 1.80 - 8.00 K/UL    Lymphocytes Absolute 4.09 (H) 0.80 - 3.50 K/UL    Monocytes Absolute 0.65 0.00 - 1.00 K/UL    Eosinophils Absolute 0.65 (H) 0.00 - 0.40 K/UL    Basophils Absolute 0.06 0.00 - 0.10 K/UL    Immature Granulocytes Absolute 0.03 0.00 - 0.04 K/UL    Differential Type AUTOMATED     Comprehensive Metabolic Panel    Collection Time: 05/11/23  4:11 AM   Result Value Ref Range    Sodium 139 136 - 145 mmol/L    Potassium 3.6 3.5 - 5.1 mmol/L  Chloride 108 97 - 108 mmol/L    CO2 27 21 - 32 mmol/L    Anion Gap 4 2 - 12 mmol/L    Glucose 104 (H) 65 - 100 mg/dL    BUN 8 6 - 20 MG/DL    Creatinine 1.61 0.96 - 1.02 MG/DL    BUN/Creatinine Ratio 9 (L) 12 - 20      Est, Glom Filt Rate 84 >60 ml/min/1.36m2    Calcium 8.7 8.5 - 10.1 MG/DL    Total Bilirubin 0.2 0.2 - 1.0 MG/DL    ALT 16 12 - 78 U/L    AST 16 15 - 37 U/L    Alk Phosphatase 69 45 - 117 U/L    Total Protein 6.6 6.4 - 8.2 g/dL    Albumin 3.2 (L) 3.5 - 5.0 g/dL    Globulin 3.4 2.0 - 4.0 g/dL    Albumin/Globulin Ratio 0.9 (L) 1.1 - 2.2     Magnesium    Collection Time: 05/11/23  4:11 AM   Result Value Ref Range    Magnesium 2.0 1.6 - 2.4 mg/dL   Troponin    Collection Time: 05/11/23  4:11 AM   Result Value Ref Range    Troponin, High Sensitivity 4 0 - 51 ng/L   CK    Collection Time: 05/11/23  4:11 AM   Result Value Ref Range    Total CK 110 26 - 192 U/L   POCT Glucose    Collection Time: 05/11/23  4:20 AM   Result Value Ref Range    POC Glucose 101 65 - 117 mg/dL    Performed by: Pricilla Handler EDT      CT ABDOMEN PELVIS W IV CONTRAST Additional Contrast? None   Final Result   No acute process.      Electronically signed by Liz Malady      CT Head W/O Contrast   Final Result      No acute process.      Electronically signed by Liz Malady      XR CHEST 1 VIEW   Final Result   No acute  process.      Electronically signed by Liz Malady      XR CHEST PORTABLE    (Results Pending)       The exam and treatment you received in the Emergency Department were for an urgent problem and are not intended as complete care. It is important that you follow up with a doctor, nurse practitioner, or physician assistant for ongoing care. If your symptoms become worse or you do not improve as expected and you are unable to reach your usual health care provider, you should return to the Emergency Department. We are available 24 hours a day.    Please take your discharge instructions with you when you go to your follow-up appointment.     If a prescription has been provided, please have it filled as soon as possible to prevent a delay in treatment. Read the entire medication instruction sheet provided to you by the pharmacy. If you have any questions or reservations about taking the medication due to side effects or interactions with other medications, please call your primary care physician or contact the ER to speak with the charge nurse.     Please make an appointment with your family doctor or the physician you were referred to for follow-up of this visit as instructed on your discharge paperwork. Return to the ER if you are unable  to be seen or if you are unable to be seen in a timely manner.    Should you experience abdominal pain lasting greater than 6 hours, chest pain, difficulty breathing, fever/chills, numbness/tingling, skin changes or other symptoms that concern you, return to the ED sooner. If you feel worse over the next 24 hours, please return to the ED. We are available 24 hours a day. Thank you for trusting [place] with your care!

## 2023-05-31 ENCOUNTER — Ambulatory Visit: Admit: 2023-05-31 | Payer: PRIVATE HEALTH INSURANCE | Primary: Primary Care

## 2023-05-31 VITALS — BP 122/76 | HR 75 | Resp 17 | Ht 61.0 in | Wt 165.0 lb

## 2023-05-31 DIAGNOSIS — G43709 Chronic migraine without aura, not intractable, without status migrainosus: Secondary | ICD-10-CM

## 2023-05-31 MED ORDER — AMITRIPTYLINE HCL 10 MG PO TABS
10 | ORAL_TABLET | Freq: Every evening | ORAL | 0 refills | 90.00000 days | Status: DC
Start: 2023-05-31 — End: 2023-08-30

## 2023-05-31 MED ORDER — RIMEGEPANT SULFATE 75 MG PO TBDP
75 | ORAL_TABLET | ORAL | 12 refills | 30.00000 days | Status: DC | PRN
Start: 2023-05-31 — End: 2023-08-30

## 2023-05-31 NOTE — Progress Notes (Signed)
 Chief Complaint   Patient presents with    Follow-up    Cerebrovascular Accident     Pateint reports extreme fatigue intermittently with insomnia       HPI    [name] is a 43 year old female here for a FU. Seen last on 11/30/22 for stroke. She had a  R SAH on 11/01/22. She is on ASA daily. She is here today with her wife. She is doing good. She is back to work and feels good. She does well all day, but when she gets home she is very tired. She does notice more word difficulties when she is fatigued. She does reporting a lot of inconsistencies with her sleep. She will be very fatigued for a few days then have terrible insomnia. She is getting more migraines now. She reports about 3-4x a week. Located in the back of the head and throbbing and tension. Sometimes sleep and ice pack can help. Usually can last anywhere from 1-5 hours. Denies any blurry vision, N/V. She also reports her mood have been all over the place. Little things can bother her more. Her wife does report that this is true, but also that she is learning to handle them better. She had a CTH the beginning of the month and it was completely normal.         Last visit  [name] is a 43 year old female here for her hospital follow up. She was seen inpatient by [facility] NP on 11/09/22 for CVA. PMHX former smoker, [facility] 9/30-10/4/24. She presented as recommended by PCP for ongoing work-up and management of SAH (since she left AMA prior to MRI) and symptoms of headache and LLE weakness. MRI brain this admission showed numerous small scattered infarcts in the right frontal and right cerebellum. Stable small SAH in right basilar cisterns. During her previous admission vasospasm was noted and Nimotop  was ordered. She is also on ASA and statin daily. She is to have outpatient cardiac monitoring. She is here today with her partner. She is doing ok since discharge. She is reporting L sided weakness and memory concerns with word finding difficulty since her stroke.  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 She is tolerating her ASA daily without any side effects. She has not seen her PCP yet for her hospital FU. She is to start PT within the next week. She is using a cane to help her get around. Denies any new stroke like s/s     Review of Systems   Eyes:  Negative for photophobia and visual disturbance.   Musculoskeletal:  Negative for gait problem.   Neurological:  Positive for speech difficulty and headaches.   Psychiatric/Behavioral:  Positive for behavioral problems and sleep disturbance.          Past Medical History:   Diagnosis Date    ADHD     Brigido Canales infection     MRSA (methicillin resistant staph aureus) culture positive     MS (congenital mitral stenosis)     PCOS (polycystic ovarian syndrome)      History reviewed. No pertinent family history.  Social History     Socioeconomic History    Marital status: Married     Spouse name: Not on file    Number of children: Not on file    Years of education: Not on file    Highest education level: Not on file   Occupational History  Not on file   Tobacco Use    Smoking status: Every Day    Smokeless tobacco: Never   Vaping Use    Vaping status: Never Used   Substance and Sexual Activity    Alcohol use: No    Drug use: Yes     Types: Marijuana Karron Pagan)    Sexual activity: Not on file   Other Topics Concern    Not on file   Social History Narrative    Not on file     Social Drivers of Health     Financial Resource Strain: Not on file   Food Insecurity: Not on file   Transportation Needs: Not on file   Physical Activity: Not on file   Stress: Not on file   Social Connections: Not on file   Intimate Partner Violence: Not on file   Housing Stability: Not on file     Allergies   Allergen Reactions    Decadron [Dexamethasone] Swelling and Anxiety    Adhesive Tape Rash    Chlorhexidine Itching    Oxycodone  Swelling    Oxycodone -Acetaminophen  Itching         Current Outpatient Medications    Medication Sig    losartan (COZAAR) 25 MG tablet Take 1 tablet by mouth daily    REPATHA SOSY syringe INJECT 1ML SUBCUTANEOUSLY EVERY 2 WEEKS    ibuprofen  (ADVIL ;MOTRIN ) 600 MG tablet Take 1 tablet by mouth 3 times daily as needed for Pain    Multiple Vitamin (MULTIVITAMIN) TABS tablet Take 1 tablet by mouth daily    pantoprazole  (PROTONIX ) 20 MG tablet Take 1 tablet by mouth daily    acetaminophen  (TYLENOL ) 325 MG tablet Take 2 tablets by mouth every 4 hours as needed for Pain    amphetamine-dextroamphetamine (ADDERALL) 20 MG tablet Take 1 tablet by mouth 2 times daily.    atorvastatin  (LIPITOR ) 40 MG tablet Take 1 tablet by mouth daily    amitriptyline  (ELAVIL ) 10 MG tablet Take 1 tablet by mouth nightly    rimegepant sulfate  75 MG TBDP Take 75 mg by mouth as needed (headache)    niMODipine  (NIMOTOP ) 30 MG capsule Take 2 capsules by mouth every 4 hours for 15 days (Patient not taking: Reported on 05/31/2023)    aspirin  81 MG chewable tablet Take 1 tablet by mouth daily (Patient not taking: Reported on 05/31/2023)    methocarbamol  (ROBAXIN ) 500 MG tablet Take 3 tablets by mouth 2 times daily as needed (headache) (Patient not taking: Reported on 05/31/2023)     No current facility-administered medications for this visit.         Neurological Exam  Mental Status  Awake, alert and oriented to person, place and time.    Cranial Nerves  CN II: Visual acuity is normal. Visual fields full to confrontation.  CN III, IV, VI: Extraocular movements intact bilaterally. Normal lids and orbits bilaterally. Pupils equal round and reactive to light bilaterally.  CN V: Facial sensation is normal.  CN VII: Full and symmetric facial movement.  CN VIII: Hearing is normal.  CN IX, X: Palate elevates symmetrically. Normal gag reflex.  CN XI: Shoulder shrug strength is normal.  CN XII: Tongue midline without atrophy or fasciculations.    Motor   Strength is 5/5 throughout all four extremities.    Sensory  Sensation is intact to light  touch, pinprick, vibration and proprioception in all four extremities.    Gait  Normal casual, toe, heel and tandem gait.  BP 122/76 (BP Site: Left Upper Arm, Patient Position: Sitting, BP Cuff Size: Small Adult)   Pulse 75   Resp 17   Ht 1.549 m (5\' 1" )   Wt 74.8 kg (165 lb)   SpO2 99%   BMI 31.18 kg/m       CT Results (maximum last 3):  CT Result (most recent):  CT HEAD WO CONTRAST 05/11/2023    Narrative  INDICATION: LOC    EXAM:  HEAD CT WITHOUT CONTRAST    COMPARISON: November 09, 2022    TECHNIQUE:  Routine noncontrast axial head CT was performed.  Sagittal and  coronal reconstructions were generated.    CT dose reduction was achieved through use of a standardized protocol tailored  for this examination and automatic exposure control for dose modulation.    FINDINGS:    Ventricles: Midline, no hydrocephalus.  Intracranial Hemorrhage: None.  Brain Parenchyma/Brainstem: Normal for age.  Basal Cisterns: Normal.  Paranasal Sinuses: Partly visualized opacification right maxillary sinus.  Additional Comments: N/A.    Impression  No acute process.    Electronically signed by Renold Cashing      CT ABDOMEN PELVIS W IV CONTRAST 05/11/2023    Narrative  INDICATION: BL flank pain    COMPARISON: March 31, 2022    TECHNIQUE:  Following the intravenous administration of IV contrast, thin axial images were  obtained through the abdomen and pelvis. Coronal and sagittal reconstructions  were generated. CT dose reduction was achieved through use of a standardized  protocol tailored for this examination and automatic exposure control for dose  modulation.    FINDINGS:  LUNG BASES: No abnormality.  LIVER: No mass or biliary dilatation.  GALLBLADDER: Unremarkable.  SPLEEN: No enlargement or lesion.  PANCREAS: No mass or ductal dilatation.  ADRENALS: No mass.  KIDNEYS: No hydronephrosis.  GI TRACT: No bowel obstruction. Difficult to assess bowel wall thickening given  lack of oral contrast material.  APPENDIX:  Absent.  PERITONEUM: No free air or free fluid.  RETROPERITONEUM: No aortic aneurysm.  LYMPH NODES: None enlarged.  ADDITIONAL COMMENTS: Small fat-containing umbilical hernia.    URINARY BLADDER: Unremarkable.  REPRODUCTIVE ORGANS: no acute findings.  LYMPH NODES: None enlarged.  FREE FLUID: None.  BONES: Sclerosis bilateral sacroiliac joints, unchanged.  ADDITIONAL COMMENTS: N/A.    Impression  No acute process.    Electronically signed by Renold Cashing      CTA HEAD NECK W CONTRAST 11/09/2022    Narrative  EXAM: CTA HEAD NECK W CONTRAST    CLINICAL HISTORY: severe HA, h/o SAH.  compare to prior CTA  INDICATION: severe HA, h/o SAH.  compare to prior CTA    COMPARISON: 11/01/2022  CONTRAST: 100 ml Isovue -370    TECHNIQUE:  Unenhanced scout images were obtained to localize the volume for  acquisition. Multi slice helical axial CT angiography was performed from the  aortic arch to the top of the head during uneventful rapid bolus intravenous  contrast administration.   Coronal and sagittal reformations and 3D post  processing was performed.  CT dose reduction was achieved through use of a  standardized protocol tailored for this examination and automatic exposure  control for dose modulation.  Examination was evaluated utilizing the viz. AI algorithm.    FINDINGS:    CTA NECK  There is conventional three vessel arch anatomy. The bilateral subclavian,  common carotid, and internal carotid arteries are patent with no flow-limiting  stenosis.  % of right carotid artery stenosis: 0  %  of left carotid artery stenosis: 0  Measurements utilizing NASCET criteria.  NASCET method was utilized for calculating stenosis.    The vertebral arteries are codominant and patent. The cervical soft tissues are  unremarkable. There are degenerative changes of the cervical spine.    CTA HEAD  There are A1 segments bilaterally. Azygos A2 segment. Petrous and cavernous  carotid arteries are patent.M1 segments are patent. Symmetric arborization  of M2  vessels is demonstrated. The basilar artery is patent. The proximal P1 segments  are patent bilaterally.There is no aneurysm. There are no sizable posterior  communicating arteries.  Scattered foci of cortical/linear/serpiginous hyperenhancement are related to  recent infarcts at the right frontal lobe, left frontal lobe, and right and left  parietal lobes.    Impression  There is no major vessel occlusion.      Atypical pattern of postcontrast enhancement is related to numerous small  scattered infarcts as demonstrated on brain MRI performed on 05/22/2022.  There is no acute intracranial process.  There is no aneurysm, dissection or hemodynamically significant stenosis.        Electronically signed by Reford Canterbury HABIB        MRI Results (maximum last 3):  MRI Result (most recent):  MRI BRAIN W WO CONTRAST 11/05/2022    Narrative  EXAM:  MRI BRAIN W WO CONTRAST    INDICATION:    hypodensity on CT Head (R Frontal Lobe)    COMPARISON: CT head 11/05/2022, MRI brain 04/23/2022.    CONTRAST: 15 ml ProHance .    TECHNIQUE:  Multiplanar multisequence acquisition without and with IV contrast of the brain.    FINDINGS:  Numerous small scattered acute supratentorial and infratentorial infarcts  throughout the cerebral hemispheres involving all lobes, largest area in the  right frontal lobe, as well as within the bilateral corona radiata, left basal  ganglia and thalamus, and bilateral cerebellum (right larger than left).    Stable small subacute subarachnoid hemorrhage within the right interpeduncular  fossa and right ambient cistern.    The ventricles are normal in size and position. There is no mass effect. There  is no cerebellar tonsillar herniation. Expected arterial flow-voids are present.  No evidence of abnormal enhancement.    The paranasal sinuses, mastoid air cells, and middle ears are clear. The orbital  contents are within normal limits. No significant osseous or scalp lesions are  identified.    Impression  1.  Numerous small scattered acute supratentorial and infratentorial infarcts as  above, largest in the right frontal lobe and right cerebellum, most consistent  with central embolic etiology.  2. Stable small subacute subarachnoid hemorrhage in the right basilar cisterns.      Electronically signed by Marisue Side      MRI BRAIN WO CONTRAST 04/23/2022    Narrative  INDICATION:   Cervicalgia; Radiculopathy, cervical region; Polyneuropathy,  unspecified; Cervical disc disorder with myelopathy, cervicothoracic region    EXAMINATION:  MRI BRAIN WO CONTRAST    COMPARISON: November 08, 2017    TECHNIQUE:  Multiplanar multisequence acquisition without contrast of the brain.      FINDINGS:    Ventricles:  Midline, no hydrocephalus.  Brain Parenchyma/Brainstem:  Normal for age. No acute infarction.  Intracranial Hemorrhage:  None.  Basal Cisterns:  Normal.  Flow Voids:  Normal.  Additional Comments: Partly visualized degenerative changes in the cervical  spine, see separate cervical spine MRI report from the same day.    Impression  No significant abnormality or acute  process.      MRI LUMBAR SPINE W WO CONTRAST 03/31/2022    Narrative  EXAM: MRI LUMBAR SPINE W WO CONTRAST    INDICATION: back pain and incontinence.    COMPARISON: CT 11/17/2020    TECHNIQUE: MR imaging of the lumbar spine was performed using the following  sequences: sagittal T1, T2, STIR;  axial T1, T2 prior to and following contrast  administration.    CONTRAST: mL of ProHance .    FINDINGS:    There is normal alignment of the lumbar spine. Vertebral body heights are  maintained. Edema is partially seen in the bilateral sacral ala.    The conus medullaris terminates at . Signal and caliber of the distal spinal  cord are within normal limits. There is no pathologic intrathecal enhancement.    The paraspinal soft tissues are within normal limits.    Lower thoracic spine: No herniation or stenosis.    L1-L2: No herniation or stenosis.    L2-L3: No herniation or  stenosis.    L3-L4: No herniation or stenosis.    L4-L5: No herniation or stenosis.    L5-S1: No herniation or stenosis.    Impression  1. Unremarkable lumbar spine.  2. Edema is partially seen in the bilateral sacral ala. This is likely related  to the previously seen sclerosis surrounding the sacroiliac joints. 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            Assessment and Plan   Di was seen today for follow-up and cerebrovascular accident.    Diagnoses and all orders for this visit:    Chronic migraine without aura without status migrainosus, not intractable  -     amitriptyline  (ELAVIL ) 10 MG tablet; Take 1 tablet by mouth nightly  -     rimegepant sulfate  75 MG TBDP; Take 75 mg by mouth as needed (headache)    Sleep difficulties  -     [facility], MD, Sleep Medicine, [place] ([address])  -     amitriptyline  (ELAVIL ) 10 MG tablet; Take 1 tablet by mouth nightly    History of subarachnoid hemorrhage  -     rimegepant sulfate  75 MG TBDP; Take 75 mg by mouth as needed (headache)    History of stroke involving cerebellum  -     rimegepant sulfate  75 MG TBDP; Take 75 mg by mouth as needed (headache)    Word finding difficulty    43 year old with history of strokes and SAH. She is looking really well today. She is happy to be back to work and keeping busy. She is really struggling with her sleep lately. She goes back and forth from not sleeping to bad fatigue. I would like to get her a sleep study to make sure there is not a underlying problem doing on. She understand. I am also going to treat her migraines with some low-dose Amitriptyline  10 mg at night. This should help with sleep and some mood. Dont take with the trazodone. Nurtec as needed for rescue. We went over how to take this medication and side effects. She just had a CTH which I reviewed and it looked great. Please restart the ASA daily for  stroke prevention. I will see her back in 3 months.      I spent 45 minutes of time today reviewing the medical record and notes, imaging,  examining the patient, and face-to-face time, patient/family teaching and medication side effects, and time spent completing documentation.        Alisia Apple, APRN - NP

## 2023-06-01 NOTE — Telephone Encounter (Signed)
Referral faxed to Dr. Evelene Croon.  Confirmation received.

## 2023-08-09 ENCOUNTER — Encounter

## 2023-08-30 ENCOUNTER — Ambulatory Visit: Admit: 2023-08-30 | Payer: PRIVATE HEALTH INSURANCE | Primary: Primary Care

## 2023-08-30 VITALS — BP 126/82 | HR 118 | Resp 18 | Ht 61.0 in | Wt 165.0 lb

## 2023-08-30 DIAGNOSIS — G43709 Chronic migraine without aura, not intractable, without status migrainosus: Principal | ICD-10-CM

## 2023-08-30 MED ORDER — UBRELVY 100 MG PO TABS
100 | ORAL_TABLET | ORAL | 5 refills | 30.00000 days | Status: AC | PRN
Start: 2023-08-30 — End: ?

## 2023-08-30 MED ORDER — BACLOFEN 5 MG PO TABS
5 | ORAL_TABLET | ORAL | 0 refills | 30.00000 days | Status: DC
Start: 2023-08-30 — End: 2023-12-01

## 2023-08-30 MED ORDER — AMITRIPTYLINE HCL 25 MG PO TABS
25 | ORAL_TABLET | Freq: Every evening | ORAL | 1 refills | 90.00000 days | Status: AC
Start: 2023-08-30 — End: ?

## 2023-08-30 NOTE — Progress Notes (Signed)
 Chief Complaint   Patient presents with    Follow-up    Migraine    Cerebrovascular Accident     Patient reports migraine frequency has improved but intensity remains.  Nurtec consistant     Vitals:    08/30/23 1507   BP: 126/82   Pulse: (!) 118   Resp: 18   SpO2: 100%

## 2023-08-30 NOTE — Progress Notes (Signed)
 Chief Complaint   Patient presents with    Follow-up    Migraine    Cerebrovascular Accident     Patient reports migraine frequency has improved but intensity remains.  Nurtec consistant       HPI    Christina Stevens is a 43 year old female here for FU. Seen last on 05/31/23 for a R SAH on 11/01/22. Residual word finding difficulty worse with fatigue. Some migraines more now with trouble sleeping. We started Nortriptyline 10  mg at night with Nurtec as needed. She is on ASA daily for stroke prevention. She is reporting that her migraine frequency is better then the intensity still remains. Nurtec doesn't really help her when she takes her.   Still has brain fog that she is struggling with, especially when she is tired. Word finding difficulty is still there. Mostly symptoms are stress dependent with her. She has been reporting some muscle spasms and stiffness, mostly L sided that come and go throughout the day. These are also stress dependent. Gait and processing have been improving.       Last visit  Christina Puccini is a 43 year old female here for a FU. Seen last on 11/30/22 for stroke. She had a  R SAH on 11/01/22. She is on ASA daily. She is here today with her wife. She is doing good. She is back to work and feels good. She does well all day, but when she gets home she is very tired. She does notice more word difficulties when she is fatigued. She does reporting a lot of inconsistencies with her sleep. She will be very fatigued for a few days then have terrible insomnia. She is getting more migraines now. She reports about 3-4x a week. Located in the back of the head and throbbing and tension. Sometimes sleep and ice pack can help. Usually can last anywhere from 1-5 hours. Denies any blurry vision, N/V. She also reports her mood have been all over the place. Little things can bother her more. Her wife does report that this is true, but also that she is learning to handle them better. She had a CTH the beginning of the month  and it was completely normal.     Review of Systems   Eyes:  Negative for photophobia and visual disturbance.   Musculoskeletal:  Positive for gait problem.   Neurological:  Positive for speech difficulty, weakness and headaches. Negative for facial asymmetry.   Psychiatric/Behavioral:  Positive for confusion and sleep disturbance. Negative for behavioral problems.          Past Medical History:   Diagnosis Date    ADHD     Elbert Shine infection     MRSA (methicillin resistant staph aureus) culture positive     MS (congenital mitral stenosis)     PCOS (polycystic ovarian syndrome)      History reviewed. No pertinent family history.  Social History     Socioeconomic History    Marital status: Married     Spouse name: Not on file    Number of children: Not on file    Years of education: Not on file    Highest education level: Not on file   Occupational History    Not on file   Tobacco Use    Smoking status: Every Day    Smokeless tobacco: Never   Vaping Use    Vaping status: Never Used   Substance and Sexual Activity    Alcohol use:  No    Drug use: Yes     Types: Marijuana Oda)    Sexual activity: Not on file   Other Topics Concern    Not on file   Social History Narrative    Not on file     Social Drivers of Health     Financial Resource Strain: Not on file   Food Insecurity: Not on file   Transportation Needs: Not on file   Physical Activity: Not on file   Stress: Not on file   Social Connections: Not on file   Intimate Partner Violence: Not on file   Housing Stability: Not on file     Allergies   Allergen Reactions    Decadron [Dexamethasone] Swelling and Anxiety    Adhesive Tape Rash    Chlorhexidine Itching    Oxycodone  Swelling    Oxycodone -Acetaminophen  Itching         Current Outpatient Medications   Medication Sig    traZODone (DESYREL) 50 MG tablet Take 1 tablet by mouth nightly as needed    amitriptyline  (ELAVIL ) 25 MG tablet Take 1 tablet by mouth nightly    Ubrogepant  (UBRELVY ) 100 MG TABS Take 100 mg  by mouth as needed (Migraines) May repeat dose in 2 hours, if needed. Max 2 doses in 24 hours    Baclofen  (LIORESAL ) 5 MG tablet Take one tab for muscle spasms and stiffness. May repeat dose after 4 hours if needed. Max dose 2 tabs in 24 hours    losartan (COZAAR) 25 MG tablet Take 1 tablet by mouth daily    REPATHA SOSY syringe INJECT 1ML SUBCUTANEOUSLY EVERY 2 WEEKS    ibuprofen  (ADVIL ;MOTRIN ) 600 MG tablet Take 1 tablet by mouth 3 times daily as needed for Pain    aspirin  81 MG chewable tablet Take 1 tablet by mouth daily    pantoprazole  (PROTONIX ) 20 MG tablet Take 1 tablet by mouth daily    acetaminophen  (TYLENOL ) 325 MG tablet Take 2 tablets by mouth every 4 hours as needed for Pain    amphetamine-dextroamphetamine (ADDERALL) 20 MG tablet Take 1 tablet by mouth 2 times daily.    atorvastatin  (LIPITOR ) 40 MG tablet Take 1 tablet by mouth daily    Multiple Vitamin (MULTIVITAMIN) TABS tablet Take 1 tablet by mouth daily (Patient not taking: Reported on 08/30/2023)    methocarbamol  (ROBAXIN ) 500 MG tablet Take 3 tablets by mouth 2 times daily as needed (headache) (Patient not taking: Reported on 08/30/2023)     No current facility-administered medications for this visit.         Neurological Exam  Mental Status  Awake, alert and oriented to person, place and time.  Expressive aphasia.    Cranial Nerves  CN II: Visual acuity is normal. Visual fields full to confrontation.  CN III, IV, VI: Extraocular movements intact bilaterally. Normal lids and orbits bilaterally. Pupils equal round and reactive to light bilaterally.  CN V: Facial sensation is normal.  CN VII: Full and symmetric facial movement.  CN VIII: Hearing is normal.  CN IX, X: Palate elevates symmetrically. Normal gag reflex.  CN XI: Shoulder shrug strength is normal.  CN XII: Tongue midline without atrophy or fasciculations.    Motor    L sided weakness.    Sensory  Sensation is intact to light touch, pinprick, vibration and proprioception in all four  extremities.    Coordination    Finger-to-nose, rapid alternating movements and heel-to-shin normal bilaterally without dysmetria.    Gait  Slow gait, L sided weakness.         BP 126/82 (BP Site: Left Upper Arm, Patient Position: Sitting, BP Cuff Size: Small Adult)   Pulse (!) 118   Resp 18   Ht 1.549 m (5' 1)   Wt 74.8 kg (165 lb)   SpO2 100%   BMI 31.18 kg/m       CT Results (maximum last 3):  CT Result (most recent):  CT HEAD WO CONTRAST 05/11/2023    Narrative  INDICATION: LOC    EXAM:  HEAD CT WITHOUT CONTRAST    COMPARISON: November 09, 2022    TECHNIQUE:  Routine noncontrast axial head CT was performed.  Sagittal and  coronal reconstructions were generated.    CT dose reduction was achieved through use of a standardized protocol tailored  for this examination and automatic exposure control for dose modulation.    FINDINGS:    Ventricles: Midline, no hydrocephalus.  Intracranial Hemorrhage: None.  Brain Parenchyma/Brainstem: Normal for age.  Basal Cisterns: Normal.  Paranasal Sinuses: Partly visualized opacification right maxillary sinus.  Additional Comments: N/A.    Impression  No acute process.    Electronically signed by Shlomo Kerns      CT ABDOMEN PELVIS W IV CONTRAST 05/11/2023    Narrative  INDICATION: BL flank pain    COMPARISON: March 31, 2022    TECHNIQUE:  Following the intravenous administration of IV contrast, thin axial images were  obtained through the abdomen and pelvis. Coronal and sagittal reconstructions  were generated. CT dose reduction was achieved through use of a standardized  protocol tailored for this examination and automatic exposure control for dose  modulation.    FINDINGS:  LUNG BASES: No abnormality.  LIVER: No mass or biliary dilatation.  GALLBLADDER: Unremarkable.  SPLEEN: No enlargement or lesion.  PANCREAS: No mass or ductal dilatation.  ADRENALS: No mass.  KIDNEYS: No hydronephrosis.  GI TRACT: No bowel obstruction. Difficult to assess bowel wall thickening  given  lack of oral contrast material.  APPENDIX: Absent.  PERITONEUM: No free air or free fluid.  RETROPERITONEUM: No aortic aneurysm.  LYMPH NODES: None enlarged.  ADDITIONAL COMMENTS: Small fat-containing umbilical hernia.    URINARY BLADDER: Unremarkable.  REPRODUCTIVE ORGANS: no acute findings.  LYMPH NODES: None enlarged.  FREE FLUID: None.  BONES: Sclerosis bilateral sacroiliac joints, unchanged.  ADDITIONAL COMMENTS: N/A.    Impression  No acute process.    Electronically signed by Shlomo Kerns      CTA HEAD NECK W CONTRAST 11/09/2022    Narrative  EXAM: CTA HEAD NECK W CONTRAST    CLINICAL HISTORY: severe HA, h/o SAH.  compare to prior CTA  INDICATION: severe HA, h/o SAH.  compare to prior CTA    COMPARISON: 11/01/2022  CONTRAST: 100 ml Isovue -370    TECHNIQUE:  Unenhanced scout images were obtained to localize the volume for  acquisition. Multi slice helical axial CT angiography was performed from the  aortic arch to the top of the head during uneventful rapid bolus intravenous  contrast administration.   Coronal and sagittal reformations and 3D post  processing was performed.  CT dose reduction was achieved through use of a  standardized protocol tailored for this examination and automatic exposure  control for dose modulation.  Examination was evaluated utilizing the viz. AI algorithm.    FINDINGS:    CTA NECK  There is conventional three vessel arch anatomy. The bilateral subclavian,  common carotid, and internal carotid arteries are patent  with no flow-limiting  stenosis.  % of right carotid artery stenosis: 0  % of left carotid artery stenosis: 0  Measurements utilizing NASCET criteria.  NASCET method was utilized for calculating stenosis.    The vertebral arteries are codominant and patent. The cervical soft tissues are  unremarkable. There are degenerative changes of the cervical spine.    CTA HEAD  There are A1 segments bilaterally. Azygos A2 segment. Petrous and cavernous  carotid arteries are  patent.M1 segments are patent. Symmetric arborization of M2  vessels is demonstrated. The basilar artery is patent. The proximal P1 segments  are patent bilaterally.There is no aneurysm. There are no sizable posterior  communicating arteries.  Scattered foci of cortical/linear/serpiginous hyperenhancement are related to  recent infarcts at the right frontal lobe, left frontal lobe, and right and left  parietal lobes.    Impression  There is no major vessel occlusion.      Atypical pattern of postcontrast enhancement is related to numerous small  scattered infarcts as demonstrated on brain MRI performed on 05/22/2022.  There is no acute intracranial process.  There is no aneurysm, dissection or hemodynamically significant stenosis.        Electronically signed by JIMMY HABIB        MRI Results (maximum last 3):  MRI Result (most recent):  MRI BRAIN W WO CONTRAST 11/05/2022    Narrative  EXAM:  MRI BRAIN W WO CONTRAST    INDICATION:    hypodensity on CT Head (R Frontal Lobe)    COMPARISON: CT head 11/05/2022, MRI brain 04/23/2022.    CONTRAST: 15 ml ProHance .    TECHNIQUE:  Multiplanar multisequence acquisition without and with IV contrast of the brain.    FINDINGS:  Numerous small scattered acute supratentorial and infratentorial infarcts  throughout the cerebral hemispheres involving all lobes, largest area in the  right frontal lobe, as well as within the bilateral corona radiata, left basal  ganglia and thalamus, and bilateral cerebellum (right larger than left).    Stable small subacute subarachnoid hemorrhage within the right interpeduncular  fossa and right ambient cistern.    The ventricles are normal in size and position. There is no mass effect. There  is no cerebellar tonsillar herniation. Expected arterial flow-voids are present.  No evidence of abnormal enhancement.    The paranasal sinuses, mastoid air cells, and middle ears are clear. The orbital  contents are within normal limits. No significant osseous or  scalp lesions are  identified.    Impression  1. Numerous small scattered acute supratentorial and infratentorial infarcts as  above, largest in the right frontal lobe and right cerebellum, most consistent  with central embolic etiology.  2. Stable small subacute subarachnoid hemorrhage in the right basilar cisterns.      Electronically signed by Wendelyn JINNY Hoops      MRI BRAIN WO CONTRAST 04/23/2022    Narrative  INDICATION:   Cervicalgia; Radiculopathy, cervical region; Polyneuropathy,  unspecified; Cervical disc disorder with myelopathy, cervicothoracic region    EXAMINATION:  MRI BRAIN WO CONTRAST    COMPARISON: November 08, 2017    TECHNIQUE:  Multiplanar multisequence acquisition without contrast of the brain.      FINDINGS:    Ventricles:  Midline, no hydrocephalus.  Brain Parenchyma/Brainstem:  Normal for age. No acute infarction.  Intracranial Hemorrhage:  None.  Basal Cisterns:  Normal.  Flow Voids:  Normal.  Additional Comments: Partly visualized degenerative changes in the cervical  spine, see separate cervical spine MRI  report from the same day.    Impression  No significant abnormality or acute process.      MRI LUMBAR SPINE W WO CONTRAST 03/31/2022    Narrative  EXAM: MRI LUMBAR SPINE W WO CONTRAST    INDICATION: back pain and incontinence.    COMPARISON: CT 11/17/2020    TECHNIQUE: MR imaging of the lumbar spine was performed using the following  sequences: sagittal T1, T2, STIR;  axial T1, T2 prior to and following contrast  administration.    CONTRAST: mL of ProHance .    FINDINGS:    There is normal alignment of the lumbar spine. Vertebral body heights are  maintained. Edema is partially seen in the bilateral sacral ala.    The conus medullaris terminates at . Signal and caliber of the distal spinal  cord are within normal limits. There is no pathologic intrathecal enhancement.    The paraspinal soft tissues are within normal limits.    Lower thoracic spine: No herniation or stenosis.    L1-L2: No  herniation or stenosis.    L2-L3: No herniation or stenosis.    L3-L4: No herniation or stenosis.    L4-L5: No herniation or stenosis.    L5-S1: No herniation or stenosis.    Impression  1. Unremarkable lumbar spine.  2. Edema is partially seen in the bilateral sacral ala. This is likely related  to the previously seen sclerosis surrounding the sacroiliac joints. 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            Assessment and Plan   Elif was seen today for follow-up, migraine and cerebrovascular accident.    Diagnoses and all orders for this visit:    Chronic migraine without aura without status migrainosus, not intractable  -     amitriptyline  (ELAVIL ) 25 MG tablet; Take 1 tablet by mouth nightly  -     Ubrogepant  (UBRELVY ) 100 MG TABS; Take 100 mg by mouth as needed (Migraines) May repeat dose in 2 hours, if needed. Max 2 doses in 24 hours    History of subarachnoid hemorrhage    History of stroke involving cerebellum  -     Baclofen  (LIORESAL ) 5 MG tablet; Take one tab for muscle spasms and stiffness. May repeat dose after 4 hours if needed. Max dose 2 tabs in 24 hours    Word finding difficulty  -     [facility] - [facility]awaja, Salmaan, PsyD, Neuropsychology, [place]    Sleep difficulties  -     amitriptyline  (ELAVIL ) 25 MG tablet; Take 1 tablet by mouth nightly    Brain fog  -     [facility] - Khawaja, Salmaan, PsyD, Neuropsychology, [place]    Left-sided weakness  -     Baclofen  (LIORESAL ) 5 MG tablet; Take one tab for muscle spasms and stiffness. May repeat dose after 4 hours if needed. Max dose 2 tabs in 24 hours          43 year old with history of stroke with SAH with residual expressive aphasia, L sided weakness, migraines and brain fog. Overall she is improving, but there is still her residual deficits mostly triggered by stress and fatigue. We are working to find a good combination of medication for her. Her  processing and emotional regulation has improved some, per her partner, but there are still some struggles with words and walking and balance. Her  migraines are still bothering her. I want to increase her Amitriptyline  to 25 mg at night and change to Ubrelvy  as needed. We went over how to use this and she understands. I have sent in some baclofen  5 mg to help with her muscle spasms and contractures. We went over how to use this as well. I do recommend some more PT to help with walking and stiffness. She has a PT that she uses that is close to her. We are coming up on a year since her stroke. I think its reasonable to get some baseline neuropsych testing done now to see if there is a underlying cognitive problem that we are missing. She agrees. I will send that in for her. Her exam today shows her L side is weaker and her gait is slightly unsteady. Her speech is slow, but she looks great. I will see her back in 6 months.       I spent 45 minutes of time today reviewing the medical record and notes, imaging, examining the patient, and face-to-face time, patient/family teaching and medication side effects, and time spent completing documentation.        Olivia LITTIE Cullens, APRN - NP

## 2023-11-09 NOTE — Telephone Encounter (Signed)
"  Returning call for scheduling. Please contact Erin  "

## 2023-11-09 NOTE — Telephone Encounter (Signed)
"  Received a call from patient's friend, Erin Michael, to schedule her referral appointment. She requests a call back   "

## 2023-11-10 NOTE — Telephone Encounter (Signed)
"  Returned call to the patient's friend. Appt scheduled.  "

## 2023-11-30 ENCOUNTER — Encounter

## 2023-12-01 MED ORDER — BACLOFEN 5 MG PO TABS
5 | ORAL_TABLET | ORAL | 0 refills | 30.00000 days | Status: AC
Start: 2023-12-01 — End: ?

## 2023-12-01 NOTE — Telephone Encounter (Signed)
"  LOV: 08/30/23  Last refill: 08/30/23 with 0 refill as a 90 day supply  Next visit: 03/01/24  "

## 2023-12-16 ENCOUNTER — Ambulatory Visit: Payer: PRIVATE HEALTH INSURANCE | Attending: Psychologist | Primary: Primary Care

## 2023-12-26 ENCOUNTER — Encounter

## 2024-01-23 ENCOUNTER — Ambulatory Visit: Payer: PRIVATE HEALTH INSURANCE | Primary: Primary Care

## 2024-03-01 ENCOUNTER — Encounter: Payer: PRIVATE HEALTH INSURANCE | Primary: Primary Care

## 2024-03-02 ENCOUNTER — Inpatient Hospital Stay: Payer: PRIVATE HEALTH INSURANCE | Attending: Internal Medicine

## 2024-03-02 MED ORDER — PROPOFOL 100 MG/10ML IV EMUL
100 | Freq: Once | INTRAVENOUS | Status: DC | PRN
Start: 2024-03-02 — End: 2024-03-02
  Administered 2024-03-02 (×4): 50 via INTRAVENOUS
  Administered 2024-03-02: 15:00:00 40 via INTRAVENOUS
  Administered 2024-03-02: 15:00:00 30 via INTRAVENOUS
  Administered 2024-03-02 (×4): 50 via INTRAVENOUS
  Administered 2024-03-02: 14:00:00 100 via INTRAVENOUS

## 2024-03-02 MED ORDER — GLYCOPYRROLATE 0.2 MG/ML IJ SOLN
0.2 | Freq: Once | INTRAMUSCULAR | Status: DC | PRN
Start: 2024-03-02 — End: 2024-03-02
  Administered 2024-03-02: 14:00:00 .2 via INTRAVENOUS

## 2024-03-02 MED ORDER — NORMAL SALINE FLUSH 0.9 % IV SOLN
0.9 | INTRAVENOUS | Status: DC | PRN
Start: 2024-03-02 — End: 2024-03-02

## 2024-03-02 MED ORDER — LIDOCAINE HCL 2 % IJ SOLN (MIXTURES ONLY)
2 | Freq: Once | INTRAMUSCULAR | Status: DC | PRN
Start: 2024-03-02 — End: 2024-03-02
  Administered 2024-03-02: 14:00:00 100 via INTRAVENOUS

## 2024-03-02 MED ORDER — SODIUM CHLORIDE 0.9 % IV SOLN
0.9 | INTRAVENOUS | Status: DC | PRN
Start: 2024-03-02 — End: 2024-03-02
  Administered 2024-03-02: 14:00:00 via INTRAVENOUS

## 2024-03-02 MED ORDER — SODIUM CHLORIDE 0.9 % IV SOLN
0.9 | INTRAVENOUS | Status: DC | PRN
Start: 2024-03-02 — End: 2024-03-02

## 2024-03-02 MED ORDER — NORMAL SALINE FLUSH 0.9 % IV SOLN
0.9 | Freq: Two times a day (BID) | INTRAVENOUS | Status: DC
Start: 2024-03-02 — End: 2024-03-02

## 2024-03-02 NOTE — Progress Notes (Addendum)
 ARRIVAL INFORMATION:  Verified patient name and date of birth, scheduled procedure, and informed consent.     Driver: Rocky (best friend) contact number: [phone]  Physician and staff can share information with the driver.     Receive texts: yes    Belongings with patient include:  Clothing,Glasses    GI FOCUSED ASSESSMENT:  Neuro: Awake, alert, oriented x4. Left sided weakness  Respiratory: even and unlabored   GI: soft and non-distended  EKG Rhythm: normal sinus rhythm    Education:  The risks and benefits of the bite block have been explained to patient.  Patient verbalizes understanding.    Reviewed general discharge instructions and driver information.     Nurse reviewed with patient, high fall risk after receiving anesthesia. Fall Risk band placed on patient pre-procedure. Informed patient that staff will assist the patient post-procedure to dress and stay with the patient at all times until they are safely discharged. Patient verbalizes understanding.       No UPT performed. Pt confirms she is not pregnant. Verbalizes associated risks.

## 2024-03-02 NOTE — Progress Notes (Addendum)
"  Endoscopy recovery  Patient returned to baseline, vital signs stable (see vital sign flowsheet). Patient offered liquids and tolerated well. Respiratory status within defined limits. Abdomen soft not tender. Skin with in defined limits. Responsible party driving patient home was given the opportunity to ask questions. Patient discharged with documented belongings.   "

## 2024-03-02 NOTE — Op Note (Signed)
 Colonoscopy and EGD Procedure Note      Indications:  nausea, vomiting, abd pain, hx/o polyps     Operator:  Prentice LITTIE Gates, MD    Staff: Circulator: Eulalio Mora, RN  Endoscopy Technician: Eloisa Hum    Referring Provider: Dalia Ozell RODES, APRN - NP    Sedation:  MAC anesthesia Propofol     Procedure Details:  After informed consent was obtained with all risks and benefits of procedure explained and pre-operative exam completed, pt was placed in the left lateral decubitus position. Following sequential administration of sedation as per above, the gastroscope was inserted into the mouth and advanced under direct vision to second portion of the duodenum.  A careful inspection was made as the gastroscope was withdrawn, including a retroflexed view of the proximal stomach; findings and interventions are described below.      EGD Findings:  EGD:  esophagus: normal esophageal mucosa. EGJ at 32cm with a 4-5cm sliding hiatal hernia, healthy appearing, hill-4 GEFV  Stomach: mild antral linear erosions and erythema, otherwise normal stomach, biopsied cold forceps minimal bleeding  - pylorus was spastic and tight. Able to traverse. No mass. Decision was made to dilate with 12-77mm pyloric balloon, placed across into the duodenum and pulled across the pylorus. Stepwise dilation performed for 60 seconds each from 12-1mm, with balloon deflation after each, with moderate dilation effect at 15mm with scant oozing and no sign of perforation  Duodenum- normal    The bed was then turned and upon sequential sedation as per above, a digital rectal exam was performed per below. The Olympus videocolonoscope was inserted in the rectum and carefully advanced to the cecum, which was identified by the ileocecal valve and appendiceal orifice.  The quality of preparation was good BPS 2/3/3 8.  The colonoscope was slowly withdrawn with careful evaluation between folds. Retroflexion in the rectum was  performed.     Colon Findings:   - DRE: unremarkable  - rectum: two sessile polyps ranging 2-32mm removed with cold snare polypectomy, retrieved, minimal bleeding  - sigmoid: unremarkable  - descending: unremarkable  - transverse: unremarkable  - ascending: unremarkable  - ICV: unremarkable. Extensive examination with HDWL and NBI and utilizing cold biopsy forceps to manipulate valve and folds without appreciable polyp recurrence  - cecum: unremarkable. Extensive examination with HDWL and NBI and utilizing cold biopsy forceps to manipulate valve and folds without appreciable polyp recurrence    Complications: None.     EBL:  minimal    Impression:    See findings above    Recommendations:   - no nsaids for 10 days, preferably ever  - Recommend complete smoking cessation of all products (tobacco & marijuana)  - omeprazole 40mg  daily sent to pharmacy. Take 1 capsule by mouth every day 1/2 hour before breakfast for gastritis and gastric ulceration  - discussed w/ Erin    Discharge Disposition:  Home in the company of a driver when able to ambulate.    Prentice LITTIE Gates, MD  03/02/2024  11:01 AM

## 2024-03-02 NOTE — H&P (Signed)
 [facility]  Outpatient History and Physical    Patient: Christina Stevens    Physician: Prentice LITTIE Gates, MD    Vital Signs: Blood pressure 110/73, pulse 83, temperature 97.8 F (36.6 C), temperature source Temporal, resp. rate 13, height 1.549 m (5' 1), weight 73.9 kg (163 lb), SpO2 96%.    Allergies:   Allergies   Allergen Reactions    Decadron [Dexamethasone] Swelling and Anxiety    Adhesive Tape Rash    Chlorhexidine Itching    Oxycodone  Swelling    Oxycodone -Acetaminophen  Itching       Chief Complaint: egd / colonoscopy to assess:  n/v and abdominal pain hx/o pyloric stenosis dilated 15mm 2023, hx/o advanced adenoma requiring piecemeal EMR 2024 by Dr. Danial    History:  Past Medical History:   Diagnosis Date    ADHD     Autism     Elbert Shine infection     Left foot drop     MRSA (methicillin resistant staph aureus) culture positive     MS (congenital mitral stenosis)     PCOS (polycystic ovarian syndrome)     Stroke (HCC) 2025    Left side weakness      Past Surgical History:   Procedure Laterality Date    APPENDECTOMY      COLONOSCOPY N/A 03/17/2022    COLONOSCOPY W/ ENDOSCOPIC MUCOSAL RESECTION performed by Danial Bias, MD at [facility] ENDOSCOPY    IR CHOLECYSTOSTOMY PERCUTANEOUS COMPLETE        Social History     Socioeconomic History    Marital status: Married     Spouse name: None    Number of children: None    Years of education: None    Highest education level: None   Tobacco Use    Smoking status: Every Day    Smokeless tobacco: Never   Vaping Use    Vaping status: Never Used   Substance and Sexual Activity    Alcohol use: No    Drug use: Yes     Types: Marijuana Oda)      History reviewed. No pertinent family history.    Medications:   Prior to Admission medications   Medication Sig Start Date End Date Taking? Authorizing Provider   Baclofen  (LIORESAL ) 5 MG tablet TAKE 1 TABLET BY MOUTH FOR MUSCLE SPASMS AND STIFFNESS. MAY REPEAT DOSE AFTER 4 HOURS IF NEEDED MAX  DAILY DOSE OF 2 TABLETS 12/01/23  Yes Jeana Olivia LITTIE, APRN - NP   traZODone (DESYREL) 50 MG tablet Take 1 tablet by mouth nightly as needed 08/08/23  Yes [provider]   Ubrogepant  (UBRELVY ) 100 MG TABS Take 100 mg by mouth as needed (Migraines) May repeat dose in 2 hours, if needed. Max 2 doses in 24 hours 08/30/23  Yes Canfield, Olivia LITTIE, APRN - NP   losartan (COZAAR) 25 MG tablet Take 1 tablet by mouth daily   Yes [provider]   ibuprofen  (ADVIL ;MOTRIN ) 600 MG tablet Take 1 tablet by mouth 3 times daily as needed for Pain 05/11/23  Yes Deuell, Zachary A, MD   aspirin  81 MG chewable tablet Take 1 tablet by mouth daily   Yes [provider]   pantoprazole  (PROTONIX ) 20 MG tablet Take 1 tablet by mouth daily   Yes [provider]   acetaminophen  (TYLENOL ) 325 MG tablet Take 2 tablets by mouth every 4 hours as needed for Pain 11/24/20  Yes Automatic Reconciliation, Ar   amphetamine-dextroamphetamine (ADDERALL)  20 MG tablet Take 1 tablet by mouth 2 times daily.   Yes Automatic Reconciliation, Ar   atorvastatin  (LIPITOR ) 40 MG tablet Take 1 tablet by mouth daily   Yes Automatic Reconciliation, Ar   amitriptyline  (ELAVIL ) 25 MG tablet Take 1 tablet by mouth nightly  Patient not taking: Reported on 03/02/2024 08/30/23   Jeana Olivia CROME, APRN - NP   REPATHA SOSY syringe INJECT 1ML SUBCUTANEOUSLY EVERY 2 WEEKS  Patient not taking: Reported on 03/02/2024 04/25/23   [provider]   Multiple Vitamin (MULTIVITAMIN) TABS tablet Take 1 tablet by mouth daily  Patient not taking: Reported on 03/02/2024    [provider]   methocarbamol  (ROBAXIN ) 500 MG tablet Take 3 tablets by mouth 2 times daily as needed (headache)  Patient not taking: Reported on 08/30/2023    [provider]       Physical Exam:     Body mass index is 30.8 kg/m.  General: no distress   HEENT: Head: Normocephalic, no lesions, without obvious abnormality.   Heart: rate normal, no leg edema     Lungs: symmetric chest rise, no work of breathing   Abdominal:  soft, NT, ND   Neurological: Grossly normal   Extremities: extremities normal, atraumatic, no cyanosis or edema     Plan of Care/Planned Procedure: egd / colonoscopy  The heart, lungs and mental status were satisfactory for the administration of MAC sedation and for the procedure by the Anesthesiology team.      Informed consent was obtained for the procedure, including sedation.  Risks of perforation, hemorrhage, adverse drug reaction, and aspiration were discussed.  The risks, benefits and alternatives were again reiterated to the patient to include the risk of infection, bleeding, medication reaction, aspiration, perforation which could require immediate surgery, cardiopulmonary complication, issues with anesthesia and death.    The patient does wish to proceed with the procedure.

## 2024-03-02 NOTE — Anesthesia Postprocedure Evaluation (Signed)
 Department of Anesthesiology  Postprocedure Note    Patient: Christina Stevens  MRN: [id number]  Birthdate: 1980/05/07  Date of evaluation: 03/02/2024    Procedure Summary       Date: 03/02/24 Room / Location: [facility] ENDO 03 / [facility]    Anesthesia Start: 0911 Anesthesia Stop: 1000    Procedures:       ESOPHAGOGASTRODUODENOSCOPY BIOPSY      ESOPHAGOGASTRODUODENOSCOPY      ESOPHAGOGASTRODUODENOSCOPY DILATATION      COLONOSCOPY POLYPECTOMY SNARE/BIOPSY Diagnosis:       Nausea and vomiting, unspecified vomiting type      Gastroparesis      Hx of colonic polyps      Pyloric stenosis      Hiatal hernia      Polyp of rectum      (Nausea and vomiting, unspecified vomiting type [R11.2])      (Gastroparesis [K31.84])      (Hx of colonic polyps [Z86.0100])      (Umbilical hernia without obstruction and without gangrene [K42.9])      (Smoker [F17.200])      (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])      (Abdominal fullness [R19.8])      (Subarachnoid hemorrhage (HCC) [I60.9])      (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

## 2024-03-02 NOTE — Progress Notes (Addendum)
"  CRE balloon dilatation of the pyloris  12 mm Balloon inflated to 3 ATMs and held for 60 seconds.  13.5 mm Balloon inflated to 4.5 ATMs and held for 60 seconds.  15 mm Balloon inflated to 8 ATMs and held for 60 seconds.    No subcutaneous crepitus of the chest or cervical region was noted post dilatation.      Endoscope was pre-cleaned at bedside immediately following procedure by Dontavia, ET.    "
